# Patient Record
Sex: Male | Born: 1960 | Race: Black or African American | Hispanic: No | Marital: Married | Smoking: Former smoker
Health system: Southern US, Community
[De-identification: ages and names within clinical notes are randomized; demographics above are authoritative.]

## PROBLEM LIST (undated history)

## (undated) DIAGNOSIS — G4730 Sleep apnea, unspecified: Secondary | ICD-10-CM

## (undated) DIAGNOSIS — Z7952 Long term (current) use of systemic steroids: Secondary | ICD-10-CM

## (undated) DIAGNOSIS — K219 Gastro-esophageal reflux disease without esophagitis: Secondary | ICD-10-CM

## (undated) DIAGNOSIS — E7800 Pure hypercholesterolemia, unspecified: Secondary | ICD-10-CM

## (undated) DIAGNOSIS — E781 Pure hyperglyceridemia: Secondary | ICD-10-CM

## (undated) DIAGNOSIS — T380X5A Adverse effect of glucocorticoids and synthetic analogues, initial encounter: Secondary | ICD-10-CM

## (undated) DIAGNOSIS — D84821 Immunodeficiency due to drugs: Secondary | ICD-10-CM

## (undated) DIAGNOSIS — E11319 Type 2 diabetes mellitus with unspecified diabetic retinopathy without macular edema: Secondary | ICD-10-CM

## (undated) DIAGNOSIS — Z940 Kidney transplant status: Secondary | ICD-10-CM

## (undated) DIAGNOSIS — E119 Type 2 diabetes mellitus without complications: Secondary | ICD-10-CM

## (undated) DIAGNOSIS — I739 Peripheral vascular disease, unspecified: Secondary | ICD-10-CM

## (undated) DIAGNOSIS — I10 Essential (primary) hypertension: Secondary | ICD-10-CM

## (undated) DIAGNOSIS — J189 Pneumonia, unspecified organism: Secondary | ICD-10-CM

## (undated) DIAGNOSIS — E1143 Type 2 diabetes mellitus with diabetic autonomic (poly)neuropathy: Secondary | ICD-10-CM

## (undated) DIAGNOSIS — N186 End stage renal disease: Secondary | ICD-10-CM

## (undated) DIAGNOSIS — R6889 Other general symptoms and signs: Secondary | ICD-10-CM

## (undated) HISTORY — PX: AV FISTULA REPAIR: SHX563

## (undated) HISTORY — PX: CATARACT EXTRACTION W/ INTRAOCULAR LENS  IMPLANT, BILATERAL: SHX1307

## (undated) HISTORY — PX: AV FISTULA PLACEMENT: SHX1204

---

## 2002-07-05 HISTORY — PX: KIDNEY TRANSPLANT: SHX239

## 2011-03-08 ENCOUNTER — Emergency Department (HOSPITAL_COMMUNITY)
Admission: EM | Admit: 2011-03-08 | Discharge: 2011-03-08 | Disposition: A | Discharge status: Home / Self Care | Payer: Medicare Other | Attending: Emergency Medicine | Admitting: Emergency Medicine

## 2011-03-08 DIAGNOSIS — I10 Essential (primary) hypertension: Secondary | ICD-10-CM | POA: Insufficient documentation

## 2011-03-08 DIAGNOSIS — F29 Unspecified psychosis not due to a substance or known physiological condition: Secondary | ICD-10-CM | POA: Insufficient documentation

## 2011-03-08 DIAGNOSIS — R5381 Other malaise: Secondary | ICD-10-CM | POA: Insufficient documentation

## 2011-03-08 DIAGNOSIS — E1169 Type 2 diabetes mellitus with other specified complication: Secondary | ICD-10-CM | POA: Insufficient documentation

## 2011-03-08 DIAGNOSIS — Z79899 Other long term (current) drug therapy: Secondary | ICD-10-CM | POA: Insufficient documentation

## 2011-03-08 DIAGNOSIS — Z794 Long term (current) use of insulin: Secondary | ICD-10-CM | POA: Insufficient documentation

## 2011-03-08 LAB — BASIC METABOLIC PANEL
BUN: 24 mg/dL — ABNORMAL HIGH (ref 6–23)
Creatinine, Ser: 0.99 mg/dL (ref 0.50–1.35)
GFR calc Af Amer: >60 mL/min (ref >60)
GFR calc non Af Amer: >60 mL/min (ref >60)
Glucose, Bld: 115 mg/dL — ABNORMAL HIGH (ref 70–99)

## 2011-03-08 LAB — URINE MICROSCOPIC-ADD ON

## 2011-03-08 LAB — URINALYSIS, ROUTINE W REFLEX MICROSCOPIC
Bilirubin Urine: NEGATIVE
Leukocytes, UA: NEGATIVE
Nitrite: NEGATIVE
Specific Gravity, Urine: 1.018 (ref 1.005–1.030)
pH: 5 (ref 5.0–8.0)

## 2011-03-08 LAB — DIFFERENTIAL
Eosinophils Relative: 0 % (ref 0–5)
Lymphocytes Relative: 17 % (ref 12–46)
Lymphs Abs: 1.4 10*3/uL (ref 0.7–4.0)
Monocytes Absolute: 0.8 10*3/uL (ref 0.1–1.0)
Monocytes Relative: 9 % (ref 3–12)

## 2011-03-08 LAB — CBC
HCT: 48 % (ref 39.0–52.0)
Hemoglobin: 16.1 g/dL (ref 13.0–17.0)
MCHC: 33.5 g/dL (ref 30.0–36.0)
MCV: 85.1 fL (ref 78.0–100.0)
RDW: 14.8 % (ref 11.5–15.5)

## 2011-03-08 LAB — GLUCOSE, CAPILLARY: Glucose-Capillary: 130 mg/dL — ABNORMAL HIGH (ref 70–99)

## 2011-11-23 ENCOUNTER — Emergency Department (HOSPITAL_COMMUNITY)
Admission: EM | Admit: 2011-11-23 | Discharge: 2011-11-23 | Disposition: A | Discharge status: Home / Self Care | Payer: Medicare Other | Attending: Emergency Medicine | Admitting: Emergency Medicine

## 2011-11-23 ENCOUNTER — Encounter (HOSPITAL_COMMUNITY): Payer: Self-pay

## 2011-11-23 DIAGNOSIS — R5381 Other malaise: Secondary | ICD-10-CM | POA: Insufficient documentation

## 2011-11-23 DIAGNOSIS — E119 Type 2 diabetes mellitus without complications: Secondary | ICD-10-CM | POA: Insufficient documentation

## 2011-11-23 DIAGNOSIS — I10 Essential (primary) hypertension: Secondary | ICD-10-CM | POA: Insufficient documentation

## 2011-11-23 DIAGNOSIS — E860 Dehydration: Secondary | ICD-10-CM | POA: Insufficient documentation

## 2011-11-23 DIAGNOSIS — R739 Hyperglycemia, unspecified: Secondary | ICD-10-CM

## 2011-11-23 DIAGNOSIS — Z79899 Other long term (current) drug therapy: Secondary | ICD-10-CM | POA: Insufficient documentation

## 2011-11-23 DIAGNOSIS — R531 Weakness: Secondary | ICD-10-CM

## 2011-11-23 HISTORY — DX: Essential (primary) hypertension: I10

## 2011-11-23 LAB — COMPREHENSIVE METABOLIC PANEL
Albumin: 2.5 g/dL — ABNORMAL LOW (ref 3.5–5.2)
Alkaline Phosphatase: 66 U/L (ref 39–117)
BUN: 22 mg/dL (ref 6–23)
Calcium: 10.2 mg/dL (ref 8.4–10.5)
GFR calc Af Amer: 71 mL/min — ABNORMAL LOW (ref >90)
Glucose, Bld: 386 mg/dL — ABNORMAL HIGH (ref 70–99)
Potassium: 4.6 mEq/L (ref 3.5–5.1)
Total Protein: 8 g/dL (ref 6.0–8.3)

## 2011-11-23 LAB — CBC
HCT: 49.4 % (ref 39.0–52.0)
MCH: 27.6 pg (ref 26.0–34.0)
MCHC: 32.8 g/dL (ref 30.0–36.0)
RDW: 13.3 % (ref 11.5–15.5)

## 2011-11-23 LAB — GLUCOSE, CAPILLARY
Glucose-Capillary: 390 mg/dL — ABNORMAL HIGH (ref 70–99)
Glucose-Capillary: 297 mg/dL — ABNORMAL HIGH (ref 70–99)
Glucose-Capillary: 251 mg/dL — ABNORMAL HIGH (ref 70–99)

## 2011-11-23 LAB — DIFFERENTIAL
Basophils Absolute: 0.1 10*3/uL (ref 0.0–0.1)
Basophils Relative: 1 % (ref 0–1)
Eosinophils Absolute: 0.1 10*3/uL (ref 0.0–0.7)
Lymphocytes Relative: 27 % (ref 12–46)
Monocytes Absolute: 1.1 10*3/uL — ABNORMAL HIGH (ref 0.1–1.0)
Neutro Abs: 5.9 10*3/uL (ref 1.7–7.7)
Neutrophils Relative %: 60 % (ref 43–77)

## 2011-11-23 LAB — URINALYSIS, ROUTINE W REFLEX MICROSCOPIC
Leukocytes, UA: NEGATIVE
Nitrite: NEGATIVE
Specific Gravity, Urine: 1.03 (ref 1.005–1.030)
Urobilinogen, UA: 0.2 mg/dL (ref 0.0–1.0)
pH: 5.5 (ref 5.0–8.0)

## 2011-11-23 LAB — LIPASE, BLOOD: Lipase: 29 U/L (ref 11–59)

## 2011-11-23 MED ORDER — INSULIN ASPART 100 UNIT/ML ~~LOC~~ SOLN
10.0000 [IU] | Freq: Once | SUBCUTANEOUS | Status: AC
  Administered 2011-11-23: 10 [IU] via INTRAVENOUS
  Filled 2011-11-23: qty 10

## 2011-11-23 MED ORDER — SODIUM CHLORIDE 0.9 % IV BOLUS (SEPSIS)
1000.0000 mL | Freq: Once | INTRAVENOUS | Status: AC
  Administered 2011-11-23: 1000 mL via INTRAVENOUS

## 2011-11-23 MED ORDER — INSULIN ASPART 100 UNIT/ML ~~LOC~~ SOLN
5.0000 [IU] | Freq: Once | SUBCUTANEOUS | Status: AC
  Administered 2011-11-23: 5 [IU] via INTRAVENOUS

## 2011-11-23 MED ORDER — MECLIZINE HCL 25 MG PO TABS
12.5000 mg | ORAL_TABLET | Freq: Once | ORAL | Status: AC
  Administered 2011-11-23: 12.5 mg via ORAL
  Filled 2011-11-23: qty 1

## 2011-11-23 MED ORDER — ONDANSETRON HCL 4 MG/2ML IJ SOLN
4.0000 mg | Freq: Once | INTRAMUSCULAR | Status: AC
  Administered 2011-11-23: 4 mg via INTRAVENOUS
  Filled 2011-11-23: qty 2

## 2011-11-23 MED ORDER — ONDANSETRON HCL 4 MG PO TABS: 4.0000 mg | ORAL_TABLET | Freq: Four times a day (QID) | ORAL | Status: AC

## 2011-11-23 MED ORDER — INSULIN ASPART 100 UNIT/ML ~~LOC~~ SOLN
5.0000 [IU] | Freq: Once | SUBCUTANEOUS | Status: AC
  Administered 2011-11-23: 5 [IU] via SUBCUTANEOUS

## 2011-11-23 MED ORDER — INSULIN ASPART 100 UNIT/ML ~~LOC~~ SOLN
5.0000 [IU] | Freq: Once | SUBCUTANEOUS | Status: AC
  Administered 2011-11-23: 5 [IU] via INTRAVENOUS
  Filled 2011-11-23: qty 5

## 2011-11-23 NOTE — ED Provider Notes (Signed)
History     CSN: [id number]  Arrival date & time 11/23/11  1478   First MD Initiated Contact with Patient 11/23/11 [phone]      Chief Complaint  Patient presents with  . Weakness    (Consider location/radiation/quality/duration/timing/severity/associated sxs/prior treatment) HPI  Patient who primarily lives in [place] however who has been residing in [place] this month visiting his daughter presents to emergency department complaining of a four-day history of gradual onset lightheadedness, generalized weakness, loss of appetite, nausea, chills, and initially some low back pain has resolved. Patient states that over the last few days he's had no appetite stating "everything I put my mouth tastes disgusting so I haven't been eating." Patient states he has been able to tolerate fluids for the most part. He states he has mild nausea but denies vomiting. Patient states he's had 2 loose stools. Patient has history of right-sided kidney transplant in 2004 but states he's had no complications and is followed by a nephrologist in [place]. Patient has history of insulin-dependent diabetes and hypertension for which takes medication. Patient states he has not been checking his blood sugars because he has not been eating. Patient denies any aggravating or alleviating factors. Patient states symptoms were gradual onset, persistent, and worsening stating "I hardly have the energy to get out of bed." He denies known recorded fever but states he's felt feverish, he denies headache, visual changes, sore throat, earache, cough, chest pain, shortness of breath, abdominal pain, dysuria, hematuria, or blood in his stool. He denies any one sided weakness stating overall he just feels weak.  Past Medical History  Diagnosis Date  . Diabetes mellitus   . Hypertension   . Renal disorder     Past Surgical History  Procedure Date  . Kidney transplant     2004  . Eye surgery     cataract removed    No  family history on file.  History  Substance Use Topics  . Smoking status: Never Smoker   . Smokeless tobacco: Not on file  . Alcohol Use: Yes      Review of Systems  All other systems reviewed and are negative.    Allergies  Penicillins  Home Medications   Current Outpatient Rx  Name Route Sig Dispense Refill  . ESOMEPRAZOLE MAGNESIUM 40 MG PO CPDR Oral Take 40 mg by mouth daily before breakfast.    . INSULIN ASPART 100 UNIT/ML [place] SOLN Subcutaneous Inject 12 Units into the skin 3 (three) times daily before meals.    . INSULIN GLARGINE 100 UNIT/ML [place] SOLN Subcutaneous Inject 32 Units into the skin 2 (two) times daily.    Marland Kitchen METOPROLOL TARTRATE 25 MG PO TABS Oral Take 25 mg by mouth 2 (two) times daily.    Marland Kitchen MYCOPHENOLATE SODIUM 360 MG PO TBEC Oral Take 360 mg by mouth 3 (three) times daily.    . OMEGA-3-ACID ETHYL ESTERS 1 G PO CAPS Oral Take 2 g by mouth 2 (two) times daily.    Marland Kitchen PREDNISONE 5 MG PO TABS Oral Take 5 mg by mouth daily.    Marland Kitchen TACROLIMUS 1 MG PO CAPS Oral Take 4 mg by mouth 2 (two) times daily.      BP 147/83  Pulse 117  Temp(Src) 99.6 F (37.6 C) (Oral)  Resp 20  Ht 5\' 10"  (1.778 m)  Wt 255 lb (115.667 kg)  BMI 36.59 kg/m2  SpO2 94%  Physical Exam  Nursing note and vitals reviewed. Constitutional: He is  oriented to person, place, and time. He appears well-developed and well-nourished. No distress.  HENT:  Head: Normocephalic and atraumatic.  Eyes: Conjunctivae and EOM are normal. Pupils are equal, round, and reactive to light.  Neck: Normal range of motion. Neck supple.       No nystagmus.   Cardiovascular: Normal rate, regular rhythm, normal heart sounds and intact distal pulses.  Exam reveals no gallop and no friction rub.   No murmur heard. Pulmonary/Chest: Effort normal and breath sounds normal. No respiratory distress. He has no wheezes. He has no rales. He exhibits no tenderness.  Abdominal: Soft. Bowel sounds are normal. He exhibits no  distension and no mass. There is no tenderness. There is no rebound and no guarding.  Musculoskeletal: Normal range of motion. He exhibits no edema and no tenderness.  Lymphadenopathy:    He has no cervical adenopathy.  Neurological: He is alert and oriented to person, place, and time. No cranial nerve deficit. Coordination normal.  Skin: Skin is warm and dry. No rash noted. He is not diaphoretic. No erythema.  Psychiatric: He has a normal mood and affect.    ED Course  Procedures (including critical care time)  IV fluids and zofran  IV insulin and 3 liters of fluid  Labs Reviewed  CBC - Abnormal; Notable for the following:    RBC 5.88 (*)    Platelets 122 (*)    All other components within normal limits  DIFFERENTIAL - Abnormal; Notable for the following:    Monocytes Absolute 1.1 (*)    All other components within normal limits  COMPREHENSIVE METABOLIC PANEL - Abnormal; Notable for the following:    Sodium 127 (*)    Chloride 85 (*)    Glucose, Bld 386 (*)    Albumin 2.5 (*)    GFR calc non Af Amer 62 (*)    GFR calc Af Amer 71 (*)    All other components within normal limits  URINALYSIS, ROUTINE W REFLEX MICROSCOPIC - Abnormal; Notable for the following:    Glucose, UA >1000 (*)    Hgb urine dipstick MODERATE (*)    Bilirubin Urine SMALL (*)    Ketones, ur 40 (*)    Protein, ur 100 (*)    All other components within normal limits  GLUCOSE, CAPILLARY - Abnormal; Notable for the following:    Glucose-Capillary 390 (*)    All other components within normal limits  GLUCOSE, CAPILLARY - Abnormal; Notable for the following:    Glucose-Capillary 297 (*)    All other components within normal limits  GLUCOSE, CAPILLARY - Abnormal; Notable for the following:    Glucose-Capillary 279 (*)    All other components within normal limits  GLUCOSE, CAPILLARY - Abnormal; Notable for the following:    Glucose-Capillary 251 (*)    All other components within normal limits  LIPASE,  BLOOD  URINE MICROSCOPIC-ADD ON   No results found.   1. Weakness   2. Hyperglycemia   3. Dehydration       MDM  After fluid resuscitation and hyperglycemia corrected in the emergency department. After 3 L of IV fluids patient is no longer orthostatic and he is ambulating without difficulty with resolution of dizziness. He is eating and drinking in ER without difficulty. Patient no neuro focal findings and is negative for ataxia. Patient is agreeable to either establishing care with her primary care provider while he is in [place] area for following up closely with his primary care provider in  [place]. Spoke at length with patient changing or worsening symptoms it should warrent return to emergency department. Given atypical lymphocytes question a recent viral illness causing his generalized weakness.         [place], [place] 11/23/11 1534

## 2011-11-23 NOTE — ED Notes (Addendum)
Pt presented to the ER with c/o generalized weakness, not eating, nausea x 4 days, diarrhea onset last night. Pt also complains of dizziness, lightheadedness, decrease appetite, chills. Pt is attached to the monitor

## 2011-11-23 NOTE — Discharge Instructions (Signed)
If you plan on staying in [place] for prolonged periods of time, it is very important establish a primary care provider while you're in [place] to manage your diabetes. Otherwise followup with your primary care provider in [place] in the near future for further evaluation and management of your diabetes is very important. Stay well-hydrated. It is very important to keep a well-rounded diet despite loss of appetite to regulate your diabetes but also to stay well-hydrated and for general strength. Return to any emergency department at any time for emergent changing or worsening symptoms.  Dehydration, Adult Dehydration means your body does not have as much fluid as it needs. Your kidneys, brain, and heart will not work properly without the right amount of fluids and salt.  HOME CARE  Ask your doctor how to replace body fluid losses (rehydrate).   Drink enough fluids to keep your pee (urine) clear or pale yellow.   Drink small amounts of fluids often if you feel sick to your stomach (nauseous) or throw up (vomit).   Eat like you normally do.   Avoid:   Foods or drinks high in sugar.   Bubbly (carbonated) drinks.   Juice.   Very hot or cold fluids.   Drinks with caffeine.   Fatty, greasy foods.   Alcohol.   Tobacco.   Eating too much.   Gelatin desserts.   Wash your hands to avoid spreading germs (bacteria, viruses).   Only take medicine as told by your doctor.   Keep all doctor visits as told.  GET HELP RIGHT AWAY IF:   You cannot drink something without throwing up.   You get worse even with treatment.   Your vomit has blood in it or looks greenish.   Your poop (stool) has blood in it or looks black and tarry.   You have not peed in 6 to 8 hours.   You pee a small amount of very dark pee.   You have a fever.   You pass out (faint).   You have belly (abdominal) pain that gets worse or stays in one spot (localizes).   You have a rash, stiff neck, or  bad headache.   You get easily annoyed, sleepy, or are hard to wake up.   You feel weak, dizzy, or very thirsty.  MAKE SURE YOU:   Understand these instructions.   Will watch your condition.   Will get help right away if you are not doing well or get worse.  Document Released: 04/17/2009 Document Revised: 06/10/2011 Document Reviewed: 02/08/2011 [facility] Patient Information 2012 [place], [place].

## 2011-11-23 NOTE — ED Notes (Signed)
Patient attempted to ambulate but was unable to. Stated he was too dizzy and weak.

## 2011-11-23 NOTE — ED Provider Notes (Signed)
Medical screening examination/treatment/procedure(s) were conducted as a shared visit with non-physician practitioner(s) and myself.  I personally evaluated the patient during the encounter   Dione Booze, MD 11/23/11 [phone]

## 2011-11-23 NOTE — ED Notes (Signed)
Seen and examined by B. Hunt PAC 

## 2011-11-23 NOTE — ED Notes (Signed)
CBG 279 

## 2011-11-23 NOTE — ED Provider Notes (Signed)
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 When compared with ECG of 03/08/2011, no significant changes are seen.  Old EKG Reviewed: unchanged  51 year old male presents with weakness for 4 days. He has noted some nausea but no chest pain and no fever or chills. His exam is significant for a 1-2/6 systolic ejection murmur heard along the left sternal border. He is noted to be moderately hyponatremic and moderately hyperglycemic. Orthostatic vital signs are markedly positive with blood pressure dropping to 70 systolic. Urinalysis is pending.  Trevor Booze, MD 11/23/11 [phone]

## 2012-02-21 ENCOUNTER — Inpatient Hospital Stay (HOSPITAL_COMMUNITY): Payer: Medicare Other

## 2012-02-21 ENCOUNTER — Encounter (HOSPITAL_COMMUNITY): Payer: Self-pay | Admitting: *Deleted

## 2012-02-21 ENCOUNTER — Inpatient Hospital Stay (HOSPITAL_COMMUNITY)
Admission: EM | Admit: 2012-02-21 | Discharge: 2012-03-01 | DRG: 256 | Disposition: A | Discharge status: Home / Self Care | Payer: Medicare Other | Attending: Internal Medicine | Admitting: Internal Medicine

## 2012-02-21 ENCOUNTER — Emergency Department (HOSPITAL_COMMUNITY): Payer: Medicare Other

## 2012-02-21 DIAGNOSIS — E1059 Type 1 diabetes mellitus with other circulatory complications: Principal | ICD-10-CM | POA: Diagnosis present

## 2012-02-21 DIAGNOSIS — M79609 Pain in unspecified limb: Secondary | ICD-10-CM

## 2012-02-21 DIAGNOSIS — E1142 Type 2 diabetes mellitus with diabetic polyneuropathy: Secondary | ICD-10-CM

## 2012-02-21 DIAGNOSIS — L03119 Cellulitis of unspecified part of limb: Secondary | ICD-10-CM

## 2012-02-21 DIAGNOSIS — D84821 Immunodeficiency due to drugs: Secondary | ICD-10-CM | POA: Insufficient documentation

## 2012-02-21 DIAGNOSIS — I10 Essential (primary) hypertension: Secondary | ICD-10-CM | POA: Diagnosis present

## 2012-02-21 DIAGNOSIS — L02619 Cutaneous abscess of unspecified foot: Secondary | ICD-10-CM | POA: Diagnosis present

## 2012-02-21 DIAGNOSIS — E11319 Type 2 diabetes mellitus with unspecified diabetic retinopathy without macular edema: Secondary | ICD-10-CM | POA: Diagnosis present

## 2012-02-21 DIAGNOSIS — E1149 Type 2 diabetes mellitus with other diabetic neurological complication: Secondary | ICD-10-CM

## 2012-02-21 DIAGNOSIS — R6889 Other general symptoms and signs: Secondary | ICD-10-CM

## 2012-02-21 DIAGNOSIS — R509 Fever, unspecified: Secondary | ICD-10-CM | POA: Diagnosis not present

## 2012-02-21 DIAGNOSIS — E781 Pure hyperglyceridemia: Secondary | ICD-10-CM

## 2012-02-21 DIAGNOSIS — G909 Disorder of the autonomic nervous system, unspecified: Secondary | ICD-10-CM

## 2012-02-21 DIAGNOSIS — L03116 Cellulitis of left lower limb: Secondary | ICD-10-CM

## 2012-02-21 DIAGNOSIS — M79672 Pain in left foot: Secondary | ICD-10-CM | POA: Diagnosis present

## 2012-02-21 DIAGNOSIS — I96 Gangrene, not elsewhere classified: Secondary | ICD-10-CM | POA: Diagnosis present

## 2012-02-21 DIAGNOSIS — E1049 Type 1 diabetes mellitus with other diabetic neurological complication: Secondary | ICD-10-CM | POA: Diagnosis present

## 2012-02-21 DIAGNOSIS — I951 Orthostatic hypotension: Secondary | ICD-10-CM | POA: Diagnosis present

## 2012-02-21 DIAGNOSIS — Z940 Kidney transplant status: Secondary | ICD-10-CM

## 2012-02-21 DIAGNOSIS — D72829 Elevated white blood cell count, unspecified: Secondary | ICD-10-CM | POA: Diagnosis present

## 2012-02-21 DIAGNOSIS — E1029 Type 1 diabetes mellitus with other diabetic kidney complication: Secondary | ICD-10-CM | POA: Diagnosis present

## 2012-02-21 DIAGNOSIS — E119 Type 2 diabetes mellitus without complications: Secondary | ICD-10-CM | POA: Diagnosis present

## 2012-02-21 DIAGNOSIS — E1039 Type 1 diabetes mellitus with other diabetic ophthalmic complication: Secondary | ICD-10-CM | POA: Diagnosis present

## 2012-02-21 DIAGNOSIS — N179 Acute kidney failure, unspecified: Secondary | ICD-10-CM | POA: Diagnosis not present

## 2012-02-21 DIAGNOSIS — L97509 Non-pressure chronic ulcer of other part of unspecified foot with unspecified severity: Secondary | ICD-10-CM | POA: Diagnosis present

## 2012-02-21 DIAGNOSIS — E860 Dehydration: Secondary | ICD-10-CM | POA: Diagnosis present

## 2012-02-21 DIAGNOSIS — E1143 Type 2 diabetes mellitus with diabetic autonomic (poly)neuropathy: Secondary | ICD-10-CM | POA: Diagnosis present

## 2012-02-21 DIAGNOSIS — E1165 Type 2 diabetes mellitus with hyperglycemia: Secondary | ICD-10-CM

## 2012-02-21 DIAGNOSIS — K219 Gastro-esophageal reflux disease without esophagitis: Secondary | ICD-10-CM | POA: Diagnosis present

## 2012-02-21 DIAGNOSIS — N058 Unspecified nephritic syndrome with other morphologic changes: Secondary | ICD-10-CM | POA: Diagnosis present

## 2012-02-21 DIAGNOSIS — H579 Unspecified disorder of eye and adnexa: Secondary | ICD-10-CM

## 2012-02-21 DIAGNOSIS — I739 Peripheral vascular disease, unspecified: Secondary | ICD-10-CM | POA: Diagnosis present

## 2012-02-21 DIAGNOSIS — E1139 Type 2 diabetes mellitus with other diabetic ophthalmic complication: Secondary | ICD-10-CM

## 2012-02-21 DIAGNOSIS — Z7952 Long term (current) use of systemic steroids: Secondary | ICD-10-CM

## 2012-02-21 DIAGNOSIS — E1152 Type 2 diabetes mellitus with diabetic peripheral angiopathy with gangrene: Secondary | ICD-10-CM | POA: Diagnosis present

## 2012-02-21 DIAGNOSIS — E1065 Type 1 diabetes mellitus with hyperglycemia: Secondary | ICD-10-CM | POA: Diagnosis present

## 2012-02-21 DIAGNOSIS — I70269 Atherosclerosis of native arteries of extremities with gangrene, unspecified extremity: Secondary | ICD-10-CM

## 2012-02-21 HISTORY — DX: Pure hyperglyceridemia: E78.1

## 2012-02-21 HISTORY — DX: Type 2 diabetes mellitus with unspecified diabetic retinopathy without macular edema: E11.319

## 2012-02-21 HISTORY — DX: Peripheral vascular disease, unspecified: I73.9

## 2012-02-21 HISTORY — DX: Long term (current) use of systemic steroids: T38.0X5A

## 2012-02-21 HISTORY — DX: Other general symptoms and signs: R68.89

## 2012-02-21 HISTORY — DX: Kidney transplant status: Z94.0

## 2012-02-21 HISTORY — DX: Gastro-esophageal reflux disease without esophagitis: K21.9

## 2012-02-21 HISTORY — DX: Long term (current) use of systemic steroids: Z79.52

## 2012-02-21 HISTORY — DX: Immunodeficiency due to drugs: D84.821

## 2012-02-21 HISTORY — DX: Type 2 diabetes mellitus with diabetic autonomic (poly)neuropathy: E11.43

## 2012-02-21 LAB — GLUCOSE, CAPILLARY
Glucose-Capillary: 262 mg/dL — ABNORMAL HIGH (ref 70–99)
Glucose-Capillary: 261 mg/dL — ABNORMAL HIGH (ref 70–99)

## 2012-02-21 LAB — CBC
MCHC: 32.5 g/dL (ref 30.0–36.0)
Platelets: 177 10*3/uL (ref 150–400)
RDW: 13.9 % (ref 11.5–15.5)
WBC: 14.5 10*3/uL — ABNORMAL HIGH (ref 4.0–10.5)

## 2012-02-21 LAB — CBC WITH DIFFERENTIAL/PLATELET
Eosinophils Absolute: 0.1 10*3/uL (ref 0.0–0.7)
Hemoglobin: 16.1 g/dL (ref 13.0–17.0)
Lymphocytes Relative: 22 % (ref 12–46)
Lymphs Abs: 2.2 10*3/uL (ref 0.7–4.0)
MCH: 28 pg (ref 26.0–34.0)
Neutro Abs: 6.3 10*3/uL (ref 1.7–7.7)
Neutrophils Relative %: 65 % (ref 43–77)
Platelets: 176 10*3/uL (ref 150–400)
RBC: 5.75 MIL/uL (ref 4.22–5.81)
WBC: 9.8 10*3/uL (ref 4.0–10.5)

## 2012-02-21 LAB — BASIC METABOLIC PANEL
Chloride: 101 mEq/L (ref 96–112)
GFR calc non Af Amer: 85 mL/min — ABNORMAL LOW (ref >90)
Glucose, Bld: 316 mg/dL — ABNORMAL HIGH (ref 70–99)
Potassium: 4.8 mEq/L (ref 3.5–5.1)
Sodium: 135 mEq/L (ref 135–145)

## 2012-02-21 LAB — CREATININE, SERUM
Creatinine, Ser: 0.91 mg/dL (ref 0.50–1.35)
GFR calc Af Amer: >90 mL/min (ref >90)
GFR calc non Af Amer: >90 mL/min (ref >90)

## 2012-02-21 MED ORDER — INSULIN GLARGINE 100 UNIT/ML ~~LOC~~ SOLN
32.0000 [IU] | Freq: Two times a day (BID) | SUBCUTANEOUS | Status: DC
  Administered 2012-02-21 – 2012-02-29 (×12): 32 [IU] via SUBCUTANEOUS

## 2012-02-21 MED ORDER — SODIUM CHLORIDE 0.9 % IV SOLN
INTRAVENOUS | Status: DC
  Administered 2012-02-21: 100 mL/h via INTRAVENOUS

## 2012-02-21 MED ORDER — MORPHINE SULFATE 2 MG/ML IJ SOLN
1.0000 mg | INTRAMUSCULAR | Status: DC | PRN
  Administered 2012-02-23 (×2): 1 mg via INTRAVENOUS
  Filled 2012-02-21 (×3): qty 1

## 2012-02-21 MED ORDER — HYDROMORPHONE HCL PF 1 MG/ML IJ SOLN
1.0000 mg | INTRAMUSCULAR | Status: DC | PRN
  Administered 2012-02-21: 1 mg via INTRAVENOUS
  Filled 2012-02-21: qty 1

## 2012-02-21 MED ORDER — ONDANSETRON HCL 4 MG/2ML IJ SOLN: 4.0000 mg | Freq: Three times a day (TID) | INTRAMUSCULAR | Status: DC | PRN

## 2012-02-21 MED ORDER — OMEGA-3-ACID ETHYL ESTERS 1 G PO CAPS
2.0000 g | ORAL_CAPSULE | Freq: Two times a day (BID) | ORAL | Status: DC
  Administered 2012-02-21 – 2012-03-01 (×17): 2 g via ORAL
  Filled 2012-02-21 (×19): qty 2

## 2012-02-21 MED ORDER — PANTOPRAZOLE SODIUM 40 MG PO TBEC
40.0000 mg | DELAYED_RELEASE_TABLET | Freq: Every day | ORAL | Status: DC
  Administered 2012-02-21 – 2012-03-01 (×8): 40 mg via ORAL
  Filled 2012-02-21 (×8): qty 1

## 2012-02-21 MED ORDER — SULFAMETHOXAZOLE-TMP DS 800-160 MG PO TABS
2.0000 | ORAL_TABLET | Freq: Two times a day (BID) | ORAL | Status: DC
  Administered 2012-02-21 – 2012-02-26 (×9): 2 via ORAL
  Filled 2012-02-21 (×12): qty 2

## 2012-02-21 MED ORDER — DOCUSATE SODIUM 100 MG PO CAPS
100.0000 mg | ORAL_CAPSULE | Freq: Two times a day (BID) | ORAL | Status: DC
  Administered 2012-02-21 – 2012-02-28 (×11): 100 mg via ORAL
  Filled 2012-02-21 (×19): qty 1

## 2012-02-21 MED ORDER — INSULIN ASPART 100 UNIT/ML ~~LOC~~ SOLN
0.0000 [IU] | Freq: Three times a day (TID) | SUBCUTANEOUS | Status: DC
  Administered 2012-02-21: 8 [IU] via SUBCUTANEOUS
  Administered 2012-02-22: 5 [IU] via SUBCUTANEOUS
  Administered 2012-02-22 – 2012-02-24 (×4): 2 [IU] via SUBCUTANEOUS
  Administered 2012-02-25: 3 [IU] via SUBCUTANEOUS
  Administered 2012-02-26: 2 [IU] via SUBCUTANEOUS
  Administered 2012-02-28 – 2012-02-29 (×2): 3 [IU] via SUBCUTANEOUS

## 2012-02-21 MED ORDER — MOXIFLOXACIN HCL 400 MG PO TABS
400.0000 mg | ORAL_TABLET | Freq: Every day | ORAL | Status: DC
  Administered 2012-02-21 – 2012-02-25 (×5): 400 mg via ORAL
  Filled 2012-02-21 (×6): qty 1

## 2012-02-21 MED ORDER — VANCOMYCIN HCL IN DEXTROSE 1-5 GM/200ML-% IV SOLN
1000.0000 mg | Freq: Once | INTRAVENOUS | Status: AC
  Administered 2012-02-21: 1000 mg via INTRAVENOUS
  Filled 2012-02-21: qty 200

## 2012-02-21 MED ORDER — SACCHAROMYCES BOULARDII 250 MG PO CAPS
250.0000 mg | ORAL_CAPSULE | Freq: Two times a day (BID) | ORAL | Status: DC
  Administered 2012-02-21 – 2012-03-01 (×17): 250 mg via ORAL
  Filled 2012-02-21 (×19): qty 1

## 2012-02-21 MED ORDER — MYCOPHENOLATE SODIUM 180 MG PO TBEC
360.0000 mg | DELAYED_RELEASE_TABLET | Freq: Three times a day (TID) | ORAL | Status: DC
  Administered 2012-02-21 – 2012-03-01 (×27): 360 mg via ORAL
  Filled 2012-02-21 (×29): qty 2

## 2012-02-21 MED ORDER — SODIUM CHLORIDE 0.9 % IV SOLN
INTRAVENOUS | Status: DC
  Administered 2012-02-21: 10:00:00 via INTRAVENOUS

## 2012-02-21 MED ORDER — INSULIN ASPART 100 UNIT/ML ~~LOC~~ SOLN
6.0000 [IU] | Freq: Three times a day (TID) | SUBCUTANEOUS | Status: DC
Start: 2012-02-21 — End: 2012-02-22
  Administered 2012-02-21 – 2012-02-22 (×2): 6 [IU] via SUBCUTANEOUS

## 2012-02-21 MED ORDER — SODIUM CHLORIDE 0.9 % IV SOLN
INTRAVENOUS | Status: DC
  Administered 2012-02-21: 1000 mL via INTRAVENOUS
  Administered 2012-02-22: 04:00:00 via INTRAVENOUS

## 2012-02-21 MED ORDER — HEPARIN SODIUM (PORCINE) 5000 UNIT/ML IJ SOLN
5000.0000 [IU] | Freq: Three times a day (TID) | INTRAMUSCULAR | Status: DC
  Administered 2012-02-21 – 2012-03-01 (×26): 5000 [IU] via SUBCUTANEOUS
  Filled 2012-02-21 (×30): qty 1

## 2012-02-21 MED ORDER — SULFAMETHOXAZOLE-TMP DS 800-160 MG PO TABS
2.0000 | ORAL_TABLET | Freq: Two times a day (BID) | ORAL | Status: DC
  Filled 2012-02-21: qty 2

## 2012-02-21 MED ORDER — TACROLIMUS 1 MG PO CAPS
4.0000 mg | ORAL_CAPSULE | Freq: Two times a day (BID) | ORAL | Status: DC
  Administered 2012-02-21 – 2012-03-01 (×17): 4 mg via ORAL
  Filled 2012-02-21 (×20): qty 4

## 2012-02-21 MED ORDER — METOPROLOL TARTRATE 25 MG PO TABS
25.0000 mg | ORAL_TABLET | Freq: Two times a day (BID) | ORAL | Status: DC
  Administered 2012-02-21 – 2012-03-01 (×18): 25 mg via ORAL
  Filled 2012-02-21 (×19): qty 1

## 2012-02-21 MED ORDER — PREDNISONE 5 MG PO TABS
5.0000 mg | ORAL_TABLET | Freq: Every day | ORAL | Status: DC
  Administered 2012-02-21 – 2012-03-01 (×9): 5 mg via ORAL
  Filled 2012-02-21 (×12): qty 1

## 2012-02-21 MED ORDER — ONDANSETRON HCL 4 MG/2ML IJ SOLN: 4.0000 mg | Freq: Four times a day (QID) | INTRAMUSCULAR | Status: DC | PRN

## 2012-02-21 MED ORDER — HYDROCODONE-ACETAMINOPHEN 5-325 MG PO TABS
1.0000 | ORAL_TABLET | ORAL | Status: DC | PRN
  Administered 2012-02-22 – 2012-03-01 (×10): 2 via ORAL
  Filled 2012-02-21 (×10): qty 2

## 2012-02-21 MED ORDER — ONDANSETRON HCL 4 MG PO TABS: 4.0000 mg | ORAL_TABLET | Freq: Four times a day (QID) | ORAL | Status: DC | PRN

## 2012-02-21 NOTE — Progress Notes (Addendum)
Disposition Note  Trevor Thompson, is a 51 y.o. male,   MRN: [id number]  -  DOB - 04/05/1961  Outpatient Primary MD for the patient is DEFAULT,PROVIDER, MD   Blood pressure 105/72, pulse 114, resp. rate 20, SpO2 97.00%.  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 PVD (peripheral vascular disease)  Insulin dependent type 2 diabetes mellitus, uncontrolled  Dehydration as evidenced by hemoconcentration  History of renal transplant  HTN (hypertension)   Called by Dr. Jodelle Gross the emergency room physician regarding request for admission. This patient is visiting from [place] and is currently unassigned. He presented to the ER because of concerns regarding changes in the left foot/toe as well as new red streak extending up the leg. He endorsed to the emergency room physician one week ago he noticed drainage from the left second toe with subsequent spontaneous demarcation and loss of toenail. No other apparent symptomatology. Emergency room physician did obtain a plain x-ray that showed no bony issues or apparent osteomyelitis. There was an incidental finding of extensive calcification of the vessels of the lower extremity. Patient's past medical history significant for insulin-dependent diabetes as well as chronic immunosuppression due to medications taken related to renal transplantation in 2004. In the emergency department his CBG was 316, hemoglobin seemed somewhat elevated at 16 and suspicious for hemoconcentration. This was further confirmed by slightly elevated calcium at 10.6. The emergency room physician has given this patient a dose of IV vancomycin to cover the cellulitis changes. I asked him to send the patient to medical bed with temporary holding orders. Also given the concerns of possible vascular disease I asked the emergency room physician to obtain arterial Dopplers and ABIs while the  patient is in the emergency department so as not to delay this test in an attempt to decrease his length of stay. I also notified the flow manager of need for medical bed. Please note I did not interview nor examine this patient.  Junious Silk, ANP

## 2012-02-21 NOTE — ED Notes (Signed)
Pt states that his left second toenail fail off one week ago and now necrotic tissue to time of toe with redness to top of foot and red streak going up leg

## 2012-02-21 NOTE — Progress Notes (Addendum)
VASCULAR LAB PRELIMINARY  ARTERIAL  ABI completed:    RIGHT    LEFT    PRESSURE WAVEFORM  PRESSURE WAVEFORM  BRACHIAL 145 Triphasic BRACHIAL 146 Triphasic  DP >300 Triphasic DP >300 Sharp Monophasic Difficult to evaluate         PT >300 Triphasic PT >300 Triphasic         GREAT TOE 88  GREAT TOE 36     RIGHT LEFT  ABI N/A Calcified N/A Calcified   ABI not ascertained due to incompressible vessels. Probably calcified. Doppler waveforms were within normal limits except for the left dorsalis pedis which was difficult to hear for evaluation. Greta toe pressures on the right indicate adequate perfusion. Left Great to pressure indicates inadequate perfusion for vascular healing  Dorthea Maina, RVS 02/21/2012, 2:12 PM

## 2012-02-21 NOTE — ED Provider Notes (Signed)
History     CSN: [id number]  Arrival date & time 02/21/12  0940   First MD Initiated Contact with Patient 02/21/12 1001      Chief Complaint  Patient presents with  . Foot Pain    (Consider location/radiation/quality/duration/timing/severity/associated sxs/prior treatment) Patient is a 51 y.o. male presenting with lower extremity pain. The history is provided by the patient.  Foot Pain This is a new problem. The current episode started more than 2 days ago. The problem occurs constantly. The problem has been gradually worsening. Pertinent negatives include no chest pain, no abdominal pain, no headaches and no shortness of breath.   Patient's left foot several days ago and the second toe nail fall off and had some oozing patient noticed over the weekend that the toe it turned black and he had redness on top and bottom of his foot. Patient is a diabetic he is from out of state he is from [place] does not have a local physician. Patient states that the pain is about 6/10. Pain described as an ache.  Past Medical History  Diagnosis Date  . Diabetes mellitus   . Hypertension   . Renal disorder     Past Surgical History  Procedure Date  . Kidney transplant     2004  . Eye surgery     cataract removed    No family history on file.  History  Substance Use Topics  . Smoking status: Never Smoker   . Smokeless tobacco: Not on file  . Alcohol Use: Yes      Review of Systems  Constitutional: Negative for fever and chills.  HENT: Negative for neck pain.   Eyes: Negative for redness.  Respiratory: Negative for shortness of breath.   Cardiovascular: Negative for chest pain.  Gastrointestinal: Negative for nausea, vomiting and abdominal pain.  Genitourinary: Negative for dysuria and hematuria.  Musculoskeletal: Negative for back pain.  Skin: Negative for rash.  Neurological: Negative for headaches.  Hematological: Does not bruise/bleed easily.    Allergies   Penicillins  Home Medications   Current Outpatient Rx  Name Route Sig Dispense Refill  . ESOMEPRAZOLE MAGNESIUM 40 MG PO CPDR Oral Take 40 mg by mouth daily before breakfast.    . INSULIN ASPART 100 UNIT/ML [place] SOLN Subcutaneous Inject 12 Units into the skin 2 (two) times daily before a meal.     . INSULIN GLARGINE 100 UNIT/ML [place] SOLN Subcutaneous Inject 32 Units into the skin 2 (two) times daily.    Marland Kitchen METOPROLOL TARTRATE 25 MG PO TABS Oral Take 25 mg by mouth 2 (two) times daily.    Marland Kitchen MYCOPHENOLATE SODIUM 360 MG PO TBEC Oral Take 360 mg by mouth 3 (three) times daily.    . OMEGA-3-ACID ETHYL ESTERS 1 G PO CAPS Oral Take 2 g by mouth 2 (two) times daily.    Marland Kitchen PREDNISONE 5 MG PO TABS Oral Take 5 mg by mouth daily.    Marland Kitchen TACROLIMUS 1 MG PO CAPS Oral Take 4 mg by mouth 2 (two) times daily.      BP 105/72  Pulse 114  Resp 20  SpO2 97%  Physical Exam  Nursing note and vitals reviewed. Constitutional: He is oriented to person, place, and time. He appears well-developed and well-nourished. No distress.  HENT:  Head: Normocephalic and atraumatic.  Mouth/Throat: Oropharynx is clear and moist.  Eyes: Conjunctivae and EOM are normal. Pupils are equal, round, and reactive to light.  Neck: Normal range of motion.  Neck supple.  Cardiovascular: Normal rate, regular rhythm and normal heart sounds.   No murmur heard. Pulmonary/Chest: Effort normal and breath sounds normal.  Abdominal: Soft. Bowel sounds are normal. There is no tenderness.  Musculoskeletal: Normal range of motion. He exhibits tenderness.       The second toe is necrotic and dry-type necrosis no wheezing no wetness cap refill is a little delayed a little peon 2 seconds no palpable dorsalis pedis appears. Tibialis pulse the right foot has good cap refill.  Neurological: He is alert and oriented to person, place, and time. No cranial nerve deficit. He exhibits normal muscle tone.  Skin: Skin is warm. No rash noted. There is erythema.     ED Course  Procedures (including critical care time)  Labs Reviewed  CBC WITH DIFFERENTIAL - Abnormal; Notable for the following:    Monocytes Absolute 1.1 (*)     All other components within normal limits  BASIC METABOLIC PANEL - Abnormal; Notable for the following:    Glucose, Bld 316 (*)     Calcium 10.6 (*)     GFR calc non Af Amer 85 (*)     All other components within normal limits  URINALYSIS, ROUTINE W REFLEX MICROSCOPIC   Dg Foot Complete Left  02/21/2012  *RADIOLOGY REPORT*  Clinical Data: Pain and necrosis of the second toe.  LEFT FOOT - COMPLETE 3+ VIEW  Comparison: None.  Findings: There is no bony abnormality.  No evidence of fracture, dislocation or osteomyelitis.  There is extensive vascular calcification throughout the region.  IMPRESSION: Extensive vascular calcification.  No bony finding.   Original Report Authenticated By: Thomasenia Sales, M.D. ( 02/21/2012 11:23:14 )    Results for orders placed during the hospital encounter of 02/21/12  CBC WITH DIFFERENTIAL      Component Value Range   WBC 9.8  4.0 - 10.5 K/uL   RBC 5.75  4.22 - 5.81 MIL/uL   Hemoglobin 16.1  13.0 - 17.0 g/dL   HCT 19.1  47.8 - 29.5 %   MCV 85.0  78.0 - 100.0 fL   MCH 28.0  26.0 - 34.0 pg   MCHC 32.9  30.0 - 36.0 g/dL   RDW 62.1  30.8 - 65.7 %   Platelets 176  150 - 400 K/uL   Neutrophils Relative 65  43 - 77 %   Neutro Abs 6.3  1.7 - 7.7 K/uL   Lymphocytes Relative 22  12 - 46 %   Lymphs Abs 2.2  0.7 - 4.0 K/uL   Monocytes Relative 12  3 - 12 %   Monocytes Absolute 1.1 (*) 0.1 - 1.0 K/uL   Eosinophils Relative 1  0 - 5 %   Eosinophils Absolute 0.1  0.0 - 0.7 K/uL   Basophils Relative 0  0 - 1 %   Basophils Absolute 0.0  0.0 - 0.1 K/uL  BASIC METABOLIC PANEL      Component Value Range   Sodium 135  135 - 145 mEq/L   Potassium 4.8  3.5 - 5.1 mEq/L   Chloride 101  96 - 112 mEq/L   CO2 24  19 - 32 mEq/L   Glucose, Bld 316 (*) 70 - 99 mg/dL   BUN 23  6 - 23 mg/dL   Creatinine,  Ser 8.46  0.50 - 1.35 mg/dL   Calcium 96.2 (*) 8.4 - 10.5 mg/dL   GFR calc non Af Amer 85 (*) >90 mL/min   GFR calc Af Amer >  90  >90 mL/min   Results for orders placed during the hospital encounter of 02/21/12  CBC WITH DIFFERENTIAL      Component Value Range   WBC 9.8  4.0 - 10.5 K/uL   RBC 5.75  4.22 - 5.81 MIL/uL   Hemoglobin 16.1  13.0 - 17.0 g/dL   HCT 98.1  19.1 - 47.8 %   MCV 85.0  78.0 - 100.0 fL   MCH 28.0  26.0 - 34.0 pg   MCHC 32.9  30.0 - 36.0 g/dL   RDW 29.5  62.1 - 30.8 %   Platelets 176  150 - 400 K/uL   Neutrophils Relative 65  43 - 77 %   Neutro Abs 6.3  1.7 - 7.7 K/uL   Lymphocytes Relative 22  12 - 46 %   Lymphs Abs 2.2  0.7 - 4.0 K/uL   Monocytes Relative 12  3 - 12 %   Monocytes Absolute 1.1 (*) 0.1 - 1.0 K/uL   Eosinophils Relative 1  0 - 5 %   Eosinophils Absolute 0.1  0.0 - 0.7 K/uL   Basophils Relative 0  0 - 1 %   Basophils Absolute 0.0  0.0 - 0.1 K/uL  BASIC METABOLIC PANEL      Component Value Range   Sodium 135  135 - 145 mEq/L   Potassium 4.8  3.5 - 5.1 mEq/L   Chloride 101  96 - 112 mEq/L   CO2 24  19 - 32 mEq/L   Glucose, Bld 316 (*) 70 - 99 mg/dL   BUN 23  6 - 23 mg/dL   Creatinine, Ser 6.57  0.50 - 1.35 mg/dL   Calcium 84.6 (*) 8.4 - 10.5 mg/dL   GFR calc non Af Amer 85 (*) >90 mL/min   GFR calc Af Amer >90  >90 mL/min    Date: 02/21/2012  Rate: 88  Rhythm: normal sinus rhythm  QRS Axis: normal  Intervals: normal  ST/T Wave abnormalities: nonspecific ST changes  Conduction Disutrbances:none  Narrative Interpretation:   Old EKG Reviewed: unchanged  Compared to 11/23/11  1. Necrotic toe   2. Cellulitis of foot   3. History of renal transplant       MDM  The patient is sine admission discussed with [facility] hospitalist they will mid to MedSurg temporary middle orders done ankle-brachial index ordered by me. Patient still has pending chest x-ray urinalysis and EKG for admission. Patient requires admission for the left foot necrotic  toe and cellulitis 1 g of vancomycin initiated in the emergency department for the cellulitis. X-rays of the foot show no evidence of bony changes to the toe or foot. Because of these are most likely related to his long-standing diabetes with poor control.        Shelda Jakes, MD 02/21/12 1254

## 2012-02-21 NOTE — Consult Note (Signed)
[facility] [place]   Vascular Consult Note    Patient name: Trevor Thompson MRN: [id number] DOB: April 28, 1961 Sex: male     Reason for referral: Gangrenous left second toe  HISTORY OF PRESENT ILLNESS: Patient is a 51 year old black male with a long history of insulin-dependent diabetes dating back to age 89. He also has a history of end-stage renal disease a long history hemodialysis. He did undergo a catheter renal transplant in 2004 is been off dialysis for nearly 10 years. He has no prior history of lower extremity tissue loss. He noted loss of his left second toe nail approximately 2 weeks ago. He had been not sought attention regarding this and he noticed it has been becoming dusky since that time as well. Apparently yesterday he noticed some erythema on his foot and presented to the emergency room for further evaluation as was admitted for antibiotic treatment. He denies any fevers at home. He does have some soreness associated with the swelling in his left foot. He does ambulate normally. He denies cardiac disease denies history of stroke.  Past Medical History  Diagnosis Date  . Type 1 diabetes mellitus, uncontrolled   . Hypertension   . Renal disorder   . PVD (peripheral vascular disease)   . S/p cadaver renal transplant   . Cold intolerance   . 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 GERD (gastroesophageal reflux disease)   . 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 Hypertriglyceridemia   . Diabetic retinopathy     Past Surgical History  Procedure Date  . Kidney transplant     2004  . Eye surgery     cataract removed    History   Social History  . Marital Status: Married    Spouse Name: N/A    Number of Children: N/A  . Years of Education: N/A   Occupational History  . Not on file.   Social History Main Topics  . Smoking status: Never Smoker   . Smokeless tobacco: Not on file  . Alcohol Use: Yes  . Drug Use: No  .  Sexually Active:    Other Topics Concern  . Not on file   Social History Narrative   Pt is married and traveling thru [place]    Family History  Problem Relation Age of Onset  . Diabetes Mother   . Diabetes Father   . Lupus Sister     Allergies  Allergen Reactions  . Penicillins Other (See Comments)    Since birth    Prior to Admission medications   Medication Sig Start Date End Date Taking? Authorizing Provider  esomeprazole (NEXIUM) 40 MG capsule Take 40 mg by mouth daily before breakfast.   Yes Historical Provider, MD  insulin aspart (NOVOLOG) 100 UNIT/ML injection Inject 12 Units into the skin 2 (two) times daily before a meal.    Yes Historical Provider, MD  insulin glargine (LANTUS) 100 UNIT/ML injection Inject 32 Units into the skin 2 (two) times daily.   Yes Historical Provider, MD  metoprolol tartrate (LOPRESSOR) 25 MG tablet Take 25 mg by mouth 2 (two) times daily.   Yes Historical Provider, MD  mycophenolate (MYFORTIC) 360 MG TBEC Take 360 mg by mouth 3 (three) times daily.   Yes Historical Provider, MD  omega-3 acid ethyl esters (LOVAZA) 1 G capsule Take 2 g by mouth 2 (two) times daily.   Yes Historical Provider, MD  predniSONE (DELTASONE) 5 MG  tablet Take 5 mg by mouth daily.   Yes Historical Provider, MD  tacrolimus (PROGRAF) 1 MG capsule Take 4 mg by mouth 2 (two) times daily.   Yes Historical Provider, MD     REVIEW OF SYSTEMS: Reviewed in his history and physical with no additions.  PHYSICAL EXAMINATION:  Filed Vitals:   02/21/12 1434  BP: 133/85  Pulse: 91  Temp: 98.5 F (36.9 C)  Resp: 20    General: The patient appears their stated age. Pulmonary: There is a good air exchange bilaterally without wheezing or rales. Abdomen: Soft and non-tender with normal pitch bowel sounds. Moderately obese. Does have a right lower quadrant scar from his transplant. Musculoskeletal: There are no major deformities.  There is no significant extremity  pain. Neurologic: No focal weakness or paresthesias are detected, Skin: There are no ulcer or rashes noted. Psychiatric: The patient has normal affect. Cardiovascular: There is a regular rate and rhythm without significant murmur appreciated. Pulse status palpable radial and brachial pulses bilaterally. He does have palpable femoral and a palpable popliteal pulses bilaterally. He has absent dorsalis pedis and posterior tibial pulse bilaterally Left foot: He does have dry gangrene of his left second toe standing to near the webspace. There is mild swelling and mild erythema of the dorsum of his foot. 1 a separate issue on the lateral aspect of his left foot lateral to the fifth metatarsal head he does have a blister with no evidence of cellulitis. This does have the appearance of tophaceous gout but the patient has no history of this. There is no significant tenderness associated with this and no surrounding erythema. Diagnostic Studies: Noninvasive vascular lab studies reveal calcified vessels which are not compressible therefore unreliable ankle arm indices. He does have triphasic waveforms in his medial vessels    Medication Changes: None  Assessment:  Long-standing diabetes and renal failure. Successful ^ renal transplant in 2004. Does have dry gangrene over his left second toe and a blister over the lateral aspect of his fifth metatarsal head. I have explained the need for arteriography for further evaluation. Agree with admission for antibiotic and observation regarding his foot. I discussed this with the patient  Plan: Lower extremity arteriogram for further evaluation of arterial flow to his left foot. This will be scheduled either Tuesday or Wednesday of this week. I did explain the slight risk of worsening renal function and that he would have a minimization of the volume of IV contrast required to evaluate his lower extremity vasculature  Cornie Herrington 8/19/20134:51 PM

## 2012-02-21 NOTE — ED Notes (Signed)
Report called to Mindy, RN on 6700 and advised patient was going to echo for exam and then to be transported from echo to floor.  This plan was confirmed with staff at echo studies, patient in NAD at time of departure to echo, IV infusing with no difficulty

## 2012-02-21 NOTE — H&P (Signed)
History and Physical Examination  Date: 02/21/2012  Patient name: Trevor Thompson Medical record number: [id number] Date of birth: 06-13-1961 Age: 51 y.o. Gender: male PCP: DEFAULT,PROVIDER, MD  Chief Complaint:  Chief Complaint  Patient presents with  . Foot Pain     History of Present Illness: Trevor Thompson is an 51 y.o. male with with a 25 year history of poorly controlled type 1 diabetes mellitus with complications of neuropathy, retinopathy and nephropathy.  The patient is status post renal transplant.  The patient is currently traveling in the [place] area and reports progressive pain over the past several days of the left foot.  The patient reports that he lost the toenail on the left second toenail and over the course of 2 days the toe became progressively necrotic and more painful.  The patient became concerned because he noticed a red streak going up the left leg.  The patient reports increasing pain 6/10 on the plantar surface of the left foot.  The patient also reports painful toes.  The patient reports progressive cold intolerance.  He has also had some fluctuating blood sugars that have been extreme.  The patient reports no history of previous amputation.  The patient reports that he was diagnosed with type 1 diabetes mellitus at the age of 22.  The patient denies fever.  Hospitalization was requested for further evaluation and management.  Past Medical History Past Medical History  Diagnosis Date  . Type 1 diabetes mellitus, uncontrolled   . Hypertension   . Renal disorder   . PVD (peripheral vascular disease)   . S/p cadaver renal transplant   . Cold intolerance   . 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 GERD (gastroesophageal reflux disease)   . 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 Hypertriglyceridemia   . Diabetic retinopathy     Past Surgical History Past Surgical History  Procedure Date  . Kidney transplant     2004  . Eye surgery     cataract removed      Home Meds: Prior to Admission medications   Medication Sig Start Date End Date Taking? Authorizing Provider  esomeprazole (NEXIUM) 40 MG capsule Take 40 mg by mouth daily before breakfast.   Yes Historical Provider, MD  insulin aspart (NOVOLOG) 100 UNIT/ML injection Inject 12 Units into the skin 2 (two) times daily before a meal.    Yes Historical Provider, MD  insulin glargine (LANTUS) 100 UNIT/ML injection Inject 32 Units into the skin 2 (two) times daily.   Yes Historical Provider, MD  metoprolol tartrate (LOPRESSOR) 25 MG tablet Take 25 mg by mouth 2 (two) times daily.   Yes Historical Provider, MD  mycophenolate (MYFORTIC) 360 MG TBEC Take 360 mg by mouth 3 (three) times daily.   Yes Historical Provider, MD  omega-3 acid ethyl esters (LOVAZA) 1 G capsule Take 2 g by mouth 2 (two) times daily.   Yes Historical Provider, MD  predniSONE (DELTASONE) 5 MG tablet Take 5 mg by mouth daily.   Yes Historical Provider, MD  tacrolimus (PROGRAF) 1 MG capsule Take 4 mg by mouth 2 (two) times daily.   Yes Historical Provider, MD   Allergies: Penicillins  Social History:  History   Social History  . Marital Status: Married    Spouse Name: N/A    Number of Children: N/A  . Years of Education: N/A   Occupational History  . Not on file.   Social History Main Topics  . Smoking status: Never Smoker   .  Smokeless tobacco: Not on file  . Alcohol Use: Yes  . Drug Use: No  . Sexually Active:    Other Topics Concern  . Not on file   Social History Narrative   Pt is married and traveling thru [place]   Family History:  Family History  Problem Relation Age of Onset  . Diabetes Mother   . Diabetes Father   . Lupus Sister     Review of Systems: Pertinent items are noted in HPI. All other systems reviewed and reported as negative.   Physical Exam: Blood pressure 133/85, pulse 91, temperature 98.5 F (36.9 C), temperature source Oral, resp. rate 20, SpO2 96.00%. General  appearance: alert, cooperative, appears stated age and no distress Head: Normocephalic, without obvious abnormality, atraumatic Ears: normal TM's and external ear canals both ears Nose: Nares normal. Septum midline. Mucosa normal. No drainage or sinus tenderness., no discharge Throat: normal findings: lips normal without lesions, buccal mucosa normal and tongue midline and normal Neck: no adenopathy, no carotid bruit, no JVD, supple, symmetrical, trachea midline and thyroid not enlarged, symmetric, no tenderness/mass/nodules Lungs: clear to auscultation bilaterally and normal percussion bilaterally Chest wall: no tenderness Heart: regular rate and rhythm, S1, S2 normal, no murmur, click, rub or gallop Abdomen: soft, non-tender; bowel sounds normal; no masses,  no organomegaly Extremities: necrotic tip of second toe left foot, unable to palpate pedal pulses, tenderness with palpation of plantar surface of left foot Pulses: unable to palpate pedal pulses left foot but both lower extremities warm Skin: findings as above  Lab  And Imaging results:  Results for orders placed during the hospital encounter of 02/21/12 (from the past 24 hour(s))  CBC WITH DIFFERENTIAL     Status: Abnormal   Collection Time   02/21/12 10:14 AM      Component Value Range   WBC 9.8  4.0 - 10.5 K/uL   RBC 5.75  4.22 - 5.81 MIL/uL   Hemoglobin 16.1  13.0 - 17.0 g/dL   HCT 40.9  81.1 - 91.4 %   MCV 85.0  78.0 - 100.0 fL   MCH 28.0  26.0 - 34.0 pg   MCHC 32.9  30.0 - 36.0 g/dL   RDW 78.2  95.6 - 21.3 %   Platelets 176  150 - 400 K/uL   Neutrophils Relative 65  43 - 77 %   Neutro Abs 6.3  1.7 - 7.7 K/uL   Lymphocytes Relative 22  12 - 46 %   Lymphs Abs 2.2  0.7 - 4.0 K/uL   Monocytes Relative 12  3 - 12 %   Monocytes Absolute 1.1 (*) 0.1 - 1.0 K/uL   Eosinophils Relative 1  0 - 5 %   Eosinophils Absolute 0.1  0.0 - 0.7 K/uL   Basophils Relative 0  0 - 1 %   Basophils Absolute 0.0  0.0 - 0.1 K/uL  BASIC  METABOLIC PANEL     Status: Abnormal   Collection Time   02/21/12 10:14 AM      Component Value Range   Sodium 135  135 - 145 mEq/L   Potassium 4.8  3.5 - 5.1 mEq/L   Chloride 101  96 - 112 mEq/L   CO2 24  19 - 32 mEq/L   Glucose, Bld 316 (*) 70 - 99 mg/dL   BUN 23  6 - 23 mg/dL   Creatinine, Ser 0.86  0.50 - 1.35 mg/dL   Calcium 57.8 (*) 8.4 - 10.5 mg/dL   GFR  calc non Af Amer 85 (*) >90 mL/min   GFR calc Af Amer >90  >90 mL/min  GLUCOSE, CAPILLARY     Status: Abnormal   Collection Time   02/21/12  2:32 PM      Component Value Range   Glucose-Capillary 262 (*) 70 - 99 mg/dL   Comment 1 Documented in Chart     Comment 2 Notify RN       Impression    *Necrotic toe left foot   Cellulitis of left foot  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  Insulin dependent type 2 diabetes mellitus, uncontrolled  HTN (hypertension)  Dehydration as evidenced by hemoconcentration  Left foot pain  Type 1 diabetes mellitus, uncontrolled  Hypertension  S/p cadaver renal transplant  Cold intolerance  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    Standley Dakins MD [facility] [place], [place] [phone] 02/21/2012, 3:42 PM

## 2012-02-22 ENCOUNTER — Encounter (HOSPITAL_COMMUNITY)
Admission: EM | Disposition: A | Discharge status: Home / Self Care | Payer: Self-pay | Source: Home / Self Care | Attending: Internal Medicine

## 2012-02-22 HISTORY — PX: ABDOMINAL AORTAGRAM: SHX5454

## 2012-02-22 LAB — COMPREHENSIVE METABOLIC PANEL
ALT: 23 U/L (ref 0–53)
BUN: 20 mg/dL (ref 6–23)
CO2: 26 mEq/L (ref 19–32)
Calcium: 9.4 mg/dL (ref 8.4–10.5)
Creatinine, Ser: 0.98 mg/dL (ref 0.50–1.35)
GFR calc Af Amer: >90 mL/min (ref >90)
GFR calc non Af Amer: >90 mL/min (ref >90)
Glucose, Bld: 179 mg/dL — ABNORMAL HIGH (ref 70–99)
Total Protein: 6.5 g/dL (ref 6.0–8.3)

## 2012-02-22 LAB — CBC
Hemoglobin: 13.6 g/dL (ref 13.0–17.0)
MCHC: 32.4 g/dL (ref 30.0–36.0)
RBC: 4.98 MIL/uL (ref 4.22–5.81)
WBC: 13.5 10*3/uL — ABNORMAL HIGH (ref 4.0–10.5)

## 2012-02-22 LAB — GLUCOSE, CAPILLARY
Glucose-Capillary: 199 mg/dL — ABNORMAL HIGH (ref 70–99)
Glucose-Capillary: 215 mg/dL — ABNORMAL HIGH (ref 70–99)

## 2012-02-22 LAB — LIPID PANEL
LDL Cholesterol: 41 mg/dL (ref 0–99)
Triglycerides: 128 mg/dL (ref <150)

## 2012-02-22 LAB — TSH: TSH: 0.989 u[IU]/mL (ref 0.350–4.500)

## 2012-02-22 SURGERY — ABDOMINAL AORTAGRAM
Anesthesia: LOCAL

## 2012-02-22 MED ORDER — CLONIDINE HCL 0.2 MG PO TABS
0.2000 mg | ORAL_TABLET | ORAL | Status: DC | PRN
  Filled 2012-02-22 (×2): qty 1

## 2012-02-22 MED ORDER — OXYCODONE HCL 5 MG PO TABS
5.0000 mg | ORAL_TABLET | ORAL | Status: DC | PRN
  Administered 2012-02-25: 10 mg via ORAL
  Filled 2012-02-22: qty 2

## 2012-02-22 MED ORDER — ALUM & MAG HYDROXIDE-SIMETH 200-200-20 MG/5ML PO SUSP: 15.0000 mL | ORAL | Status: DC | PRN

## 2012-02-22 MED ORDER — ONDANSETRON HCL 4 MG/2ML IJ SOLN: 4.0000 mg | Freq: Four times a day (QID) | INTRAMUSCULAR | Status: DC | PRN

## 2012-02-22 MED ORDER — HEPARIN (PORCINE) IN NACL 2-0.9 UNIT/ML-% IJ SOLN
INTRAMUSCULAR | Status: AC
  Filled 2012-02-22: qty 2000

## 2012-02-22 MED ORDER — INSULIN ASPART 100 UNIT/ML ~~LOC~~ SOLN
10.0000 [IU] | Freq: Three times a day (TID) | SUBCUTANEOUS | Status: DC
  Administered 2012-02-22 – 2012-03-01 (×12): 10 [IU] via SUBCUTANEOUS

## 2012-02-22 MED ORDER — MIDAZOLAM HCL 2 MG/2ML IJ SOLN
INTRAMUSCULAR | Status: AC
  Filled 2012-02-22: qty 2

## 2012-02-22 MED ORDER — FENTANYL CITRATE 0.05 MG/ML IJ SOLN
INTRAMUSCULAR | Status: AC
  Filled 2012-02-22: qty 2

## 2012-02-22 MED ORDER — ACETAMINOPHEN 325 MG PO TABS
325.0000 mg | ORAL_TABLET | ORAL | Status: DC | PRN
  Administered 2012-02-25 – 2012-02-29 (×2): 650 mg via ORAL
  Filled 2012-02-22 (×2): qty 2

## 2012-02-22 MED ORDER — METOPROLOL TARTRATE 1 MG/ML IV SOLN
2.0000 mg | INTRAVENOUS | Status: DC | PRN
  Filled 2012-02-22: qty 5

## 2012-02-22 MED ORDER — LIDOCAINE HCL (PF) 1 % IJ SOLN
INTRAMUSCULAR | Status: AC
  Filled 2012-02-22: qty 30

## 2012-02-22 MED ORDER — GUAIFENESIN-DM 100-10 MG/5ML PO SYRP: 15.0000 mL | ORAL_SOLUTION | ORAL | Status: DC | PRN

## 2012-02-22 MED ORDER — MORPHINE SULFATE 2 MG/ML IJ SOLN: 2.0000 mg | INTRAMUSCULAR | Status: DC | PRN

## 2012-02-22 MED ORDER — SODIUM CHLORIDE 0.9 % IV SOLN
INTRAVENOUS | Status: DC
  Administered 2012-02-22 – 2012-02-24 (×4): via INTRAVENOUS
  Administered 2012-02-25 – 2012-02-26 (×2): 75 mL/h via INTRAVENOUS

## 2012-02-22 MED ORDER — PHENOL 1.4 % MT LIQD: 1.0000 | OROMUCOSAL | Status: DC | PRN

## 2012-02-22 MED ORDER — ACETAMINOPHEN 650 MG RE SUPP: 325.0000 mg | RECTAL | Status: DC | PRN

## 2012-02-22 NOTE — Evaluation (Signed)
Physical Therapy Evaluation Patient Details Name: Trevor Thompson MRN: [id number] DOB: January 27, 1961 Today's Date: 02/22/2012 Time: 4098-1191 PT Time Calculation (min): 15 min  PT Assessment / Plan / Recommendation Clinical Impression  Pt admitted with left 2nd toe necrosis and ulceration of 5th met head. Pt ambulating with weight on heel of left foot without assistive device and states he is mobilizing as well as he was PTA. Pt currently without therapy needs and encouraged to mobilize as tolerated. Pt unable to ambulate in the hall today due to restrictions post femoral catherization but pt states no difficulties and denies need for assessment with hall ambulation or stairs. Signing off    PT Assessment  Patent does not need any further PT services    Follow Up Recommendations  No PT follow up    Barriers to Discharge        Equipment Recommendations  None recommended by PT    Recommendations for Other Services     Frequency      Precautions / Restrictions Precautions Precautions: None   Pertinent Vitals/Pain 4/10 left foot      Mobility  Bed Mobility Bed Mobility: Supine to Sit Supine to Sit: 6: Modified independent (Device/Increase time);HOB elevated Transfers Transfers: Sit to Stand;Stand to Sit Sit to Stand: 6: Modified independent (Device/Increase time);From toilet;From bed Stand to Sit: 6: Modified independent (Device/Increase time);To toilet;To bed Ambulation/Gait Ambulation/Gait Assistance: 6: Modified independent (Device/Increase time) Ambulation Distance (Feet): 15 Feet (15'x 2) Assistive device: None Ambulation/Gait Assistance Details: Pt maintains weight on heel of left foot throughout ambulation with slightly decreased stance on LLE Gait Pattern: Decreased stance time - left;Step-through pattern;Within Functional Limits Stairs: No    Exercises     PT Diagnosis:    PT Problem List:   PT Treatment Interventions:     PT Goals    Visit Information  Last PT  Received On: 02/22/12 Assistance Needed: +1    Subjective Data  Subjective: I just need to get to the bathroom Patient Stated Goal: get that foot to stop hurting   Prior Functioning  Home Living Lives With: Spouse Available Help at Discharge: Family;Available 24 hours/day Type of Home: House Home Access: Stairs to enter [employer] of Steps: 2 Entrance Stairs-Rails: None Home Layout: Two level;Bed/bath upstairs Alternate Level Stairs-Number of Steps: 14 Alternate Level Stairs-Rails: Right Bathroom Shower/Tub: Engineer, manufacturing systems: Standard Home Adaptive Equipment: None Prior Function Level of Independence: Independent Able to Take Stairs?: Yes Driving: Yes Communication Communication: No difficulties    Cognition  Overall Cognitive Status: Appears within functional limits for tasks assessed/performed Arousal/Alertness: Awake/alert Orientation Level: Appears intact for tasks assessed Behavior During Session: [facility] for tasks performed    Extremity/Trunk Assessment Right Upper Extremity Assessment RUE ROM/Strength/Tone: [facility] for tasks assessed Left Upper Extremity Assessment LUE ROM/Strength/Tone: [facility] for tasks assessed Right Lower Extremity Assessment RLE ROM/Strength/Tone: [facility] for tasks assessed Left Lower Extremity Assessment LLE ROM/Strength/Tone: [facility] for tasks assessed   Balance    End of Session PT - End of Session Activity Tolerance: Patient tolerated treatment well Patient left: in chair;with call bell/phone within reach  GP     Delorse Lek 02/22/2012, 2:22 PM  Delaney Meigs, PT [phone]

## 2012-02-22 NOTE — H&P (Signed)
      [facility] [place]   Vascular Consult Note    Patient name: Trevor Thompson MRN: [id number] DOB: 10/09/1960 Sex: male     Reason for referral: Gangrenous left second toe  HISTORY OF PRESENT ILLNESS: Patient is a 50-year-old black male with a long history of insulin-dependent diabetes dating back to age 24. He also has a history of end-stage renal disease a long history hemodialysis. He did undergo a catheter renal transplant in 2004 is been off dialysis for nearly 10 years. He has no prior history of lower extremity tissue loss. He noted loss of his left second toe nail approximately 2 weeks ago. He had been not sought attention regarding this and he noticed it has been becoming dusky since that time as well. Apparently yesterday he noticed some erythema on his foot and presented to the emergency room for further evaluation as was admitted for antibiotic treatment. He denies any fevers at home. He does have some soreness associated with the swelling in his left foot. He does ambulate normally. He denies cardiac disease denies history of stroke.  Past Medical History  Diagnosis Date  . Type 1 diabetes mellitus, uncontrolled   . Hypertension   . Renal disorder   . PVD (peripheral vascular disease)   . S/p cadaver renal transplant   . Cold intolerance   . 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 GERD (gastroesophageal reflux disease)   . 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 Hypertriglyceridemia   . Diabetic retinopathy     Past Surgical History  Procedure Date  . Kidney transplant     2004  . Eye surgery     cataract removed    History   Social History  . Marital Status: Married    Spouse Name: N/A    Number of Children: N/A  . Years of Education: N/A   Occupational History  . Not on file.   Social History Main Topics  . Smoking status: Never Smoker   . Smokeless tobacco: Not on file  . Alcohol Use: Yes  . Drug Use: No  .  Sexually Active:    Other Topics Concern  . Not on file   Social History Narrative   Pt is married and traveling thru [place]    Family History  Problem Relation Age of Onset  . Diabetes Mother   . Diabetes Father   . Lupus Sister     Allergies  Allergen Reactions  . Penicillins Other (See Comments)    Since birth    Prior to Admission medications   Medication Sig Start Date End Date Taking? Authorizing Provider  esomeprazole (NEXIUM) 40 MG capsule Take 40 mg by mouth daily before breakfast.   Yes Historical Provider, MD  insulin aspart (NOVOLOG) 100 UNIT/ML injection Inject 12 Units into the skin 2 (two) times daily before a meal.    Yes Historical Provider, MD  insulin glargine (LANTUS) 100 UNIT/ML injection Inject 32 Units into the skin 2 (two) times daily.   Yes Historical Provider, MD  metoprolol tartrate (LOPRESSOR) 25 MG tablet Take 25 mg by mouth 2 (two) times daily.   Yes Historical Provider, MD  mycophenolate (MYFORTIC) 360 MG TBEC Take 360 mg by mouth 3 (three) times daily.   Yes Historical Provider, MD  omega-3 acid ethyl esters (LOVAZA) 1 G capsule Take 2 g by mouth 2 (two) times daily.   Yes Historical Provider, MD  predniSONE (DELTASONE) 5 MG   tablet Take 5 mg by mouth daily.   Yes Historical Provider, MD  tacrolimus (PROGRAF) 1 MG capsule Take 4 mg by mouth 2 (two) times daily.   Yes Historical Provider, MD     REVIEW OF SYSTEMS: Reviewed in his history and physical with no additions.  PHYSICAL EXAMINATION:  Filed Vitals:   02/21/12 1434  BP: 133/85  Pulse: 91  Temp: 98.5 F (36.9 C)  Resp: 20    General: The patient appears their stated age. Pulmonary: There is a good air exchange bilaterally without wheezing or rales. Abdomen: Soft and non-tender with normal pitch bowel sounds. Moderately obese. Does have a right lower quadrant scar from his transplant. Musculoskeletal: There are no major deformities.  There is no significant extremity  pain. Neurologic: No focal weakness or paresthesias are detected, Skin: There are no ulcer or rashes noted. Psychiatric: The patient has normal affect. Cardiovascular: There is a regular rate and rhythm without significant murmur appreciated. Pulse status palpable radial and brachial pulses bilaterally. He does have palpable femoral and a palpable popliteal pulses bilaterally. He has absent dorsalis pedis and posterior tibial pulse bilaterally Left foot: He does have dry gangrene of his left second toe standing to near the webspace. There is mild swelling and mild erythema of the dorsum of his foot. 1 a separate issue on the lateral aspect of his left foot lateral to the fifth metatarsal head he does have a blister with no evidence of cellulitis. This does have the appearance of tophaceous gout but the patient has no history of this. There is no significant tenderness associated with this and no surrounding erythema. Diagnostic Studies: Noninvasive vascular lab studies reveal calcified vessels which are not compressible therefore unreliable ankle arm indices. He does have triphasic waveforms in his medial vessels    Medication Changes: None  Assessment:  Long-standing diabetes and renal failure. Successful ^ renal transplant in 2004. Does have dry gangrene over his left second toe and a blister over the lateral aspect of his fifth metatarsal head. I have explained the need for arteriography for further evaluation. Agree with admission for antibiotic and observation regarding his foot. I discussed this with the patient  Plan: Lower extremity arteriogram for further evaluation of arterial flow to his left foot. This will be scheduled either Tuesday or Wednesday of this week. I did explain the slight risk of worsening renal function and that he would have a minimization of the volume of IV contrast required to evaluate his lower extremity vasculature  EARLY, TODD 8/19/20134:51 PM     over the lateral aspect of his fifth metatarsal head. I have explained the need for arteriography for further evaluation. Agree with admission for antibiotic and observation regarding his foot. I discussed this with the patient   Plan: Lower extremity arteriogram for further evaluation of arterial flow to his left foot. This will  be scheduled either Tuesday or Wednesday of this week. I did explain the slight risk of worsening renal function and that he would have a minimization of the volume of IV contrast required to evaluate his lower extremity vasculature   EARLY, TODD 8/19/20134:51 PM

## 2012-02-22 NOTE — Progress Notes (Signed)
[facility] Progress Note  02/22/2012   Subjective: Pt still having significant left foot pain and not able to walk on it without severe pain,  Had arteriogram study today  Objective:  Vital signs in last 24 hours: Filed Vitals:   02/22/12 0820 02/22/12 1026 02/22/12 1400 02/22/12 1809  BP:  150/90 136/84 141/90  Pulse: 80 87 85 90  Temp:  98.9 F (37.2 C) 98.7 F (37.1 C) 98.1 F (36.7 C)  TempSrc:  Oral Oral Oral  Resp:  18 18 20   Height:      Weight:      SpO2:  94% 95% 98%   Weight change:   Intake/Output Summary (Last 24 hours) at 02/22/12 1814 Last data filed at 02/22/12 1812  Gross per 24 hour  Intake    600 ml  Output   1500 ml  Net   -900 ml   Lab Results  Component Value Date   HGBA1C 11.2* 02/21/2012   Lab Results  Component Value Date   LDLCALC 41 02/22/2012   CREATININE 0.98 02/22/2012    Review of Systems As above, otherwise all reviewed and reported negative  Physical Exam General - awake, no distress, cooperative HEENT - NCAT, MMM Lungs - BBS, CTA CV - normal s1, s2 sounds Abd - soft, nondistended, no masses, nontender Ext - necrotic tip of second toe left foot, unable to palpate pedal pulses, tenderness with palpation of plantar surface of left foot   Lab Results: Results for orders placed during the hospital encounter of 02/21/12 (from the past 24 hour(s))  GLUCOSE, CAPILLARY     Status: Abnormal   Collection Time   02/21/12  9:05 PM      Component Value Range   Glucose-Capillary 199 (*) 70 - 99 mg/dL  GLUCOSE, CAPILLARY     Status: Abnormal   Collection Time   02/22/12  3:22 AM      Component Value Range   Glucose-Capillary 164 (*) 70 - 99 mg/dL  COMPREHENSIVE METABOLIC PANEL     Status: Abnormal   Collection Time   02/22/12  4:42 AM      Component Value Range   Sodium 135  135 - 145 mEq/L   Potassium 4.1  3.5 - 5.1 mEq/L   Chloride 101  96 - 112 mEq/L   CO2 26  19 - 32 mEq/L   Glucose, Bld 179 (*) 70 - 99 mg/dL   BUN 20   6 - 23 mg/dL   Creatinine, Ser 1.61  0.50 - 1.35 mg/dL   Calcium 9.4  8.4 - 09.6 mg/dL   Total Protein 6.5  6.0 - 8.3 g/dL   Albumin 2.4 (*) 3.5 - 5.2 g/dL   AST 20  0 - 37 U/L   ALT 23  0 - 53 U/L   Alkaline Phosphatase 79  39 - 117 U/L   Total Bilirubin 0.5  0.3 - 1.2 mg/dL   GFR calc non Af Amer >90  >90 mL/min   GFR calc Af Amer >90  >90 mL/min  CBC     Status: Abnormal   Collection Time   02/22/12  4:42 AM      Component Value Range   WBC 13.5 (*) 4.0 - 10.5 K/uL   RBC 4.98  4.22 - 5.81 MIL/uL   Hemoglobin 13.6  13.0 - 17.0 g/dL   HCT 04.5  40.9 - 81.1 %   MCV 84.3  78.0 - 100.0 fL   MCH 27.3  26.0 -  34.0 pg   MCHC 32.4  30.0 - 36.0 g/dL   RDW 16.1  09.6 - 04.5 %   Platelets 166  150 - 400 K/uL  LIPID PANEL     Status: Normal   Collection Time   02/22/12  4:42 AM      Component Value Range   Cholesterol 131  0 - 200 mg/dL   Triglycerides 409  <811 mg/dL   HDL 64  >91 mg/dL   Total CHOL/HDL Ratio 2.0     VLDL 26  0 - 40 mg/dL   LDL Cholesterol 41  0 - 99 mg/dL  GLUCOSE, CAPILLARY     Status: Abnormal   Collection Time   02/22/12  9:12 AM      Component Value Range   Glucose-Capillary 236 (*) 70 - 99 mg/dL  GLUCOSE, CAPILLARY     Status: Abnormal   Collection Time   02/22/12 10:18 AM      Component Value Range   Glucose-Capillary 207 (*) 70 - 99 mg/dL  GLUCOSE, CAPILLARY     Status: Abnormal   Collection Time   02/22/12 11:52 AM      Component Value Range   Glucose-Capillary 215 (*) 70 - 99 mg/dL   Comment 1 Notify RN     Comment 2 Documented in Chart      Micro Results: No results found for this or any previous visit (from the past 240 hour(s)).  Medications:  Scheduled Meds:   . docusate sodium  100 mg Oral BID  . fentaNYL      . heparin      . heparin  5,000 Units Subcutaneous Q8H  . insulin aspart  0-15 Units Subcutaneous TID WC  . insulin aspart  10 Units Subcutaneous TID WC  . insulin glargine  32 Units Subcutaneous BID  . lidocaine      .  metoprolol tartrate  25 mg Oral BID  . midazolam      . moxifloxacin  400 mg Oral Q2000  . mycophenolate  360 mg Oral TID  . omega-3 acid ethyl esters  2 g Oral BID  . pantoprazole  40 mg Oral Q breakfast  . predniSONE  5 mg Oral Q breakfast  . saccharomyces boulardii  250 mg Oral BID  . sulfamethoxazole-trimethoprim  2 tablet Oral Q12H  . tacrolimus  4 mg Oral BID  . DISCONTD: insulin aspart  6 Units Subcutaneous TID WC   Continuous Infusions:   . sodium chloride 75 mL/hr at 02/22/12 1705  . DISCONTD: sodium chloride 100 mL/hr at 02/22/12 0406   PRN Meds:.acetaminophen, acetaminophen, alum & mag hydroxide-simeth, cloNIDine, guaiFENesin-dextromethorphan, HYDROcodone-acetaminophen, metoprolol, morphine injection, ondansetron (ZOFRAN) IV, ondansetron, oxyCODONE, phenol, DISCONTD:  morphine injection, DISCONTD: ondansetron  Assessment/Plan: Poorly controlled type 1 DM - as evidenced by A1c of >11% - continue basal bolus, increase mealtime novolog to 10 units TIDAC plus supplemental coverage - pt is at high risk for losing his transplanted kidney from poorly controlled disease  Dry Gangrene left toe - appreciate vascular surgery evaluation - awaiting for final recs - pt had arteriogram study today, awaiting results and recs  HTN - blood pressures better controlled  - resume home meds  Dehydration - continue gentle IVFs  Left foot pain/claudication - pain meds as ordered  Hypertriglyceridemia - will not improve until glycemic control is improved  GERD - continue protonix  S/p renal transplant - resume home meds   LOS: 1 day   Clanford Johnson 02/22/2012, 6:14  PM  Cleora Fleet, MD, CDE, FAAFP [facility] [place], [place]  [phone]

## 2012-02-22 NOTE — Op Note (Signed)
[facility]  Patient name: Trevor Thompson MRN: [id number] DOB: 07-18-1960 Sex: male  02/21/2012 - 02/22/2012 Pre-operative Diagnosis: Left foot ulcer Post-operative diagnosis:  Same Surgeon:  Jorge Ny Procedure Performed:  1.  ultrasound access right femoral artery  2.  abdominal aortogram   3.  bilateral lower extremity runoff  4.  second order catheterization    Indications:  The patient has dry gangrene of his left second toe and ulceration of the fifth metatarsal head. He is status post renal transplant with diabetes. Ultrasound revealed triphasic waveforms however his vessels were heavily calcified. He does not have palpable pedal pulses. He therefore comes in for arteriogram to better evaluate his blood supply.  Procedure:  The patient was identified in the holding area and taken to room 8.  The patient was then placed supine on the table and prepped and draped in the usual sterile fashion.  A time out was called.  Ultrasound was used to evaluate the right common femoral artery.  It was patent .  A digital ultrasound image was acquired.  A micropuncture needle was used to access the right common femoral artery under ultrasound guidance.  An 018 wire was advanced without resistance and a micropuncture sheath was placed.  The 018 wire was removed and a benson wire was placed.  The micropuncture sheath was exchanged for a 5 french sheath.  An omniflush catheter was advanced over the wire to the level of L-1.  An abdominal angiogram was obtained.  Next, using the omniflush catheter and a benson wire, the aortic bifurcation was crossed and the catheter was placed into theleft external iliac artery and left runoff was obtained.  right runoff was performed via retrograde sheath injections.  Findings:   Aortogram:  The visualized portions of the suprarenal abdominal aorta showed no significant disease. There is approximately a 50% left renal artery stenosis. The  right renal artery is widely patent. The infrarenal abdominal aorta is widely patent without stenosis. Bilateral common external and internal iliac arteries are widely patent.  Right Lower Extremity:  The right common femoral artery is widely patent the right profunda femoral artery is widely patent right superficial femoral artery is widely patent. The right popliteal artery is widely patent throughout it's course there is three-vessel runoff with diffuse disease out onto the foot.  Left Lower Extremity:  The left common femoral artery is widely patent the left profunda femoral artery is widely patent the left superficial femoral and popliteal artery are widely patent. There is three-vessel runoff to the ankle. There is diffuse disease out onto the foot there is opacification of the plantar arch however these vessels appeared diminutive as to the digital arteries.  Intervention:  None  Impression:  #1  no significant inflow outflow or runoff disease to the ankle however there is diffuse disease within all vessels below the ankle  #2  no options for revascularization as no significant stenosis is identified proximal to the ankle    V. Durene Cal, M.D. [facility] [place] Office: [phone] Pager:  [phone]

## 2012-02-23 DIAGNOSIS — E1152 Type 2 diabetes mellitus with diabetic peripheral angiopathy with gangrene: Secondary | ICD-10-CM | POA: Diagnosis present

## 2012-02-23 DIAGNOSIS — E1159 Type 2 diabetes mellitus with other circulatory complications: Secondary | ICD-10-CM

## 2012-02-23 DIAGNOSIS — D899 Disorder involving the immune mechanism, unspecified: Secondary | ICD-10-CM

## 2012-02-23 DIAGNOSIS — I798 Other disorders of arteries, arterioles and capillaries in diseases classified elsewhere: Secondary | ICD-10-CM

## 2012-02-23 LAB — GLUCOSE, CAPILLARY
Glucose-Capillary: 113 mg/dL — ABNORMAL HIGH (ref 70–99)
Glucose-Capillary: 184 mg/dL — ABNORMAL HIGH (ref 70–99)
Glucose-Capillary: 109 mg/dL — ABNORMAL HIGH (ref 70–99)
Glucose-Capillary: 120 mg/dL — ABNORMAL HIGH (ref 70–99)
Glucose-Capillary: 139 mg/dL — ABNORMAL HIGH (ref 70–99)

## 2012-02-23 LAB — CBC
HCT: 42.6 % (ref 39.0–52.0)
Hemoglobin: 13.5 g/dL (ref 13.0–17.0)
RBC: 5.02 MIL/uL (ref 4.22–5.81)
WBC: 14.2 10*3/uL — ABNORMAL HIGH (ref 4.0–10.5)

## 2012-02-23 LAB — BASIC METABOLIC PANEL
BUN: 15 mg/dL (ref 6–23)
Chloride: 103 mEq/L (ref 96–112)
GFR calc Af Amer: 79 mL/min — ABNORMAL LOW (ref >90)
Potassium: 4 mEq/L (ref 3.5–5.1)

## 2012-02-23 MED ORDER — GABAPENTIN 300 MG PO CAPS
300.0000 mg | ORAL_CAPSULE | Freq: Two times a day (BID) | ORAL | Status: AC
  Administered 2012-02-24 (×2): 300 mg via ORAL
  Filled 2012-02-23 (×2): qty 1

## 2012-02-23 MED ORDER — GABAPENTIN 300 MG PO CAPS
300.0000 mg | ORAL_CAPSULE | Freq: Every day | ORAL | Status: AC
  Administered 2012-02-23: 300 mg via ORAL
  Filled 2012-02-23: qty 1

## 2012-02-23 MED ORDER — GABAPENTIN 300 MG PO CAPS
300.0000 mg | ORAL_CAPSULE | Freq: Three times a day (TID) | ORAL | Status: DC
  Administered 2012-02-25 – 2012-03-01 (×15): 300 mg via ORAL
  Filled 2012-02-23 (×19): qty 1

## 2012-02-23 NOTE — Progress Notes (Signed)
Patient ID: Trevor Thompson, male   DOB: 06/26/1961, 50 y.o.   MRN: [id number] No clinical change in the patient's left foot. He does have dry gangrene over the distal portion of his left second toe. The area of blistering over the lateral aspect of his fifth near metatarsal head is stable. There is no evidence of inflammation. I did review the patient's arteriogram from yesterday showing patent tibial vessels down to the level of the ankle with a distal disease in the foot. I feel it is best chance for healing would be primary toe amputation on the left. I did explain that potential for nonhealing of this. Also explained the possible need for more proximal level of amputation if he could have a nonhealing. I scheduled this for Friday, August 23 

## 2012-02-23 NOTE — Progress Notes (Signed)
Subjective: Trevor Thompson conversation with the patient regarding the need for amputation of his toe. The patient was very receptive to this and had an expectation that his toe could be salvaged with revascularization. I discussed with him the findings of Dr. Arbie Cookey on the arteriogram and that there was no role for revascularization given the findings.  The patient states that this current regimen of pain medication is not really helping his pain and he describes pain as pins and needles in the bilateral lower extremities  Objective: Filed Vitals:   02/22/12 2038 02/23/12 0540 02/23/12 0850 02/23/12 1509  BP: 154/92 124/73 130/83 123/85  Pulse: 93 81 89 93  Temp: 99.6 F (37.6 C) 97.3 F (36.3 C) 99.5 F (37.5 C) 99 F (37.2 C)  TempSrc: Oral Oral Oral Oral  Resp: 20 20 20 20   Height: 5\' 11"  (1.803 m)     Weight: 113.399 kg (250 lb)     SpO2: 93% 95% 97% 98%   Weight change: -0 kg (-0 oz)  Intake/Output Summary (Last 24 hours) at 02/23/12 1818 Last data filed at 02/23/12 0855  Gross per 24 hour  Intake    720 ml  Output    875 ml  Net   -155 ml    General: Alert, awake, oriented x3, in no acute distress.  HEENT: [place]/AT PEERL, EOMI Neck: Trachea midline,  no masses, no thyromegal,y no JVD, no carotid bruit Heart: Regular rate and rhythm, without murmurs, rubs, gallops, PMI non-displaced, no heaves or thrills on palpation.  Lungs: Clear to auscultation. Neuro: No focal neurological deficits noted. Musculoskeletal: No warm swelling or erythema around joints. Patient has a clearly gangrenous left second toe. There is no inflammation present although the patient has a mild swelling of the entire left foot. Psychiatric: Patient alert and oriented x3, however he does not appear to have good insight into the extent of his acute illness.    Lab Results:  [facility] 02/23/12 0640 02/22/12 0442  NA 136 135  K 4.0 4.1  CL 103 101  CO2 25 26  GLUCOSE 135* 179*  BUN 15 20  CREATININE 1.20  0.98  CALCIUM 9.3 9.4  MG -- --  PHOS -- --    Basename 02/22/12 0442  AST 20  ALT 23  ALKPHOS 79  BILITOT 0.5  PROT 6.5  ALBUMIN 2.4*   No results found for this basename: LIPASE:2,AMYLASE:2 in the last 72 hours  Basename 02/23/12 0640 02/22/12 0442 02/21/12 1014  WBC 14.2* 13.5* --  NEUTROABS -- -- 6.3  HGB 13.5 13.6 --  HCT 42.6 42.0 --  MCV 84.9 84.3 --  PLT 156 166 --   No results found for this basename: CKTOTAL:3,CKMB:3,CKMBINDEX:3,TROPONINI:3 in the last 72 hours No components found with this basename: POCBNP:3 No results found for this basename: DDIMER:2 in the last 72 hours  Basename 02/21/12 1626  HGBA1C 11.2*    Basename 02/22/12 0442  CHOL 131  HDL 64  LDLCALC 41  TRIG 128  CHOLHDL 2.0  LDLDIRECT --    Basename 02/21/12 1626  TSH 0.989  T4TOTAL --  T3FREE --  THYROIDAB --   No results found for this basename: VITAMINB12:2,FOLATE:2,FERRITIN:2,TIBC:2,IRON:2,RETICCTPCT:2 in the last 72 hours  Micro Results: No results found for this or any previous visit (from the past 240 hour(s)).  Studies/Results: Dg Chest Port 1 View  02/21/2012  *RADIOLOGY REPORT*  Clinical Data: Foot pain.  Renal transplant patient.  Admission.  PORTABLE CHEST - 1 VIEW  Comparison: None.  Findings: Streaky opacity is seen in the right perihilar region, which may be due to atelectasis, scarring, or developing infiltrate.  Left lung is clear.  No evidence of pleural effusion. Heart size is within normal limits.  IMPRESSION: Right perihilar opacity, which may be due to atelectasis, scarring, or developing infiltrate.  Radiographic followup is recommended.   Original Report Authenticated By: Danae Orleans, M.D.    Dg Foot Complete Left  02/21/2012  *RADIOLOGY REPORT*  Clinical Data: Pain and necrosis of the second toe.  LEFT FOOT - COMPLETE 3+ VIEW  Comparison: None.  Findings: There is no bony abnormality.  No evidence of fracture, dislocation or osteomyelitis.  There is extensive  vascular calcification throughout the region.  IMPRESSION: Extensive vascular calcification.  No bony finding.   Original Report Authenticated By: Thomasenia Sales, M.D. ( 02/21/2012 11:23:14 )     Medications: I have reviewed the patient's current medications. Scheduled Meds:   . docusate sodium  100 mg Oral BID  . gabapentin  300 mg Oral QHS  . gabapentin  300 mg Oral BID  . gabapentin  300 mg Oral TID  . heparin  5,000 Units Subcutaneous Q8H  . insulin aspart  0-15 Units Subcutaneous TID WC  . insulin aspart  10 Units Subcutaneous TID WC  . insulin glargine  32 Units Subcutaneous BID  . metoprolol tartrate  25 mg Oral BID  . moxifloxacin  400 mg Oral Q2000  . mycophenolate  360 mg Oral TID  . omega-3 acid ethyl esters  2 g Oral BID  . pantoprazole  40 mg Oral Q breakfast  . predniSONE  5 mg Oral Q breakfast  . saccharomyces boulardii  250 mg Oral BID  . sulfamethoxazole-trimethoprim  2 tablet Oral Q12H  . tacrolimus  4 mg Oral BID   Continuous Infusions:   . 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 early. The patient will need amputation of his left ear. I will speak with Dr. Arbie Cookey or involve orthopedics surgery as necessary for amputation of the left toe.     Cellulitis of left foot (02/21/2012)   Assessment: Continue Avelox for soft tissue coverage of the     ? PVD (peripheral vascular disease) (02/21/2012)   Assessment: Patient has findings of diffuse peripheral arterial disease is noted on arteriogram. 50% stenosis of renal artery also noted.     History of renal transplant  (02/21/2012)   Assessment: Patient continues on immunosuppressive medications.    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

## 2012-02-23 NOTE — Progress Notes (Signed)
Covering Clinical Child psychotherapist (CSW) received a referral to speak to pt who requested to speak to a CSW. CSW visited pt room, introduced herself and inquired whether pt had questions for CSW. Pt stated he did not have any questions and that CSW referral was inappropriate. CSW signing off. Please reconsult if pt has any CSW needs.  Theresia Bough, MSW, Theresia Majors [phone]

## 2012-02-24 LAB — GLUCOSE, CAPILLARY: Glucose-Capillary: 82 mg/dL (ref 70–99)

## 2012-02-24 MED ORDER — VANCOMYCIN HCL 1000 MG IV SOLR
1500.0000 mg | INTRAVENOUS | Status: AC
  Administered 2012-02-25: 1500 mg via INTRAVENOUS
  Filled 2012-02-24 (×3): qty 1500

## 2012-02-24 MED ORDER — VANCOMYCIN HCL IN DEXTROSE 1-5 GM/200ML-% IV SOLN: 1000.0000 mg | INTRAVENOUS | Status: DC

## 2012-02-24 NOTE — Progress Notes (Signed)
Subjective: Trevor Thompson more accepting of anticipated amputation of toe scheduled for tomorrow. Trevor Thompson's main concern of that the pain resolve with the amputation. Objective: Filed Vitals:   02/23/12 2048 02/24/12 0405 02/24/12 1000 02/24/12 1436  BP: 148/68 128/82 118/78 133/91  Pulse: 89 87 80 88  Temp: 98.3 F (36.8 C) 99.8 F (37.7 C) 99.9 F (37.7 C) 98.2 F (36.8 C)  TempSrc: Oral Oral Oral Oral  Resp: 20 20 18 19   Height:      Weight: 112.6 kg (248 lb 3.8 oz)     SpO2: 92% 95% 93% 93%   Weight change: -0.799 kg (-1 lb 12.2 oz)  Intake/Output Summary (Last 24 hours) at 02/24/12 2038 Last data filed at 02/24/12 1706  Gross per 24 hour  Intake    750 ml  Output    600 ml  Net    150 ml    General: Alert, awake, oriented x3, in no acute distress.  HEENT: [place]/AT PEERL, EOMI Heart: Regular rate and rhythm, without murmurs, rubs, gallops Lungs: Clear to auscultation. No wheezing otr rales Neuro: No focal neurological deficits noted. Musculoskeletal: No warm swelling or erythema around joints. Patient has a clearly gangrenous left second toe. There is no inflammation present although the patient has a mild swelling of the entire left foot.    Lab Results:  [facility] 02/23/12 0640 02/22/12 0442  NA 136 135  K 4.0 4.1  CL 103 101  CO2 25 26  GLUCOSE 135* 179*  BUN 15 20  CREATININE 1.20 0.98  CALCIUM 9.3 9.4  MG -- --  PHOS -- --    Basename 02/22/12 0442  AST 20  ALT 23  ALKPHOS 79  BILITOT 0.5  PROT 6.5  ALBUMIN 2.4*   No results found for this basename: LIPASE:2,AMYLASE:2 in the last 72 hours  Basename 02/23/12 0640 02/22/12 0442  WBC 14.2* 13.5*  NEUTROABS -- --  HGB 13.5 13.6  HCT 42.6 42.0  MCV 84.9 84.3  PLT 156 166   No results found for this basename: CKTOTAL:3,CKMB:3,CKMBINDEX:3,TROPONINI:3 in the last 72 hours No components found with this basename: POCBNP:3 No results found for this basename: DDIMER:2 in the last 72 hours No results found for this  basename: HGBA1C:2 in the last 72 hours  Basename 02/22/12 0442  CHOL 131  HDL 64  LDLCALC 41  TRIG 128  CHOLHDL 2.0  LDLDIRECT --   No results found for this basename: TSH,T4TOTAL,FREET3,T3FREE,THYROIDAB in the last 72 hours No results found for this basename: VITAMINB12:2,FOLATE:2,FERRITIN:2,TIBC:2,IRON:2,RETICCTPCT:2 in the last 72 hours  Micro Results: No results found for this or any previous visit (from the past 240 hour(s)).  Studies/Results: Dg Chest Port 1 View  02/21/2012  *RADIOLOGY REPORT*  Clinical Data: Foot pain.  Renal transplant patient.  Admission.  PORTABLE CHEST - 1 VIEW  Comparison: None.  Findings: Streaky opacity is seen in the right perihilar region, which may be due to atelectasis, scarring, or developing infiltrate.  Left lung is clear.  No evidence of pleural effusion. Heart size is within normal limits.  IMPRESSION: Right perihilar opacity, which may be due to atelectasis, scarring, or developing infiltrate.  Radiographic followup is recommended.   Original Report Authenticated By: Danae Orleans, M.D.    Dg Foot Complete Left  02/21/2012  *RADIOLOGY REPORT*  Clinical Data: Pain and necrosis of the second toe.  LEFT FOOT - COMPLETE 3+ VIEW  Comparison: None.  Findings: There is no bony abnormality.  No evidence of fracture, dislocation  or osteomyelitis.  There is extensive vascular calcification throughout the region.  IMPRESSION: Extensive vascular calcification.  No bony finding.   Original Report Authenticated By: Thomasenia Sales, M.D. ( 02/21/2012 11:23:14 )     Medications: I have reviewed the patient's current medications. Scheduled Meds:    . docusate sodium  100 mg Oral BID  . gabapentin  300 mg Oral QHS  . gabapentin  300 mg Oral BID  . gabapentin  300 mg Oral TID  . heparin  5,000 Units Subcutaneous Q8H  . insulin aspart  0-15 Units Subcutaneous TID WC  . insulin aspart  10 Units Subcutaneous TID WC  . insulin glargine  32 Units Subcutaneous BID    . metoprolol tartrate  25 mg Oral BID  . moxifloxacin  400 mg Oral Q2000  . mycophenolate  360 mg Oral TID  . omega-3 acid ethyl esters  2 g Oral BID  . pantoprazole  40 mg Oral Q breakfast  . predniSONE  5 mg Oral Q breakfast  . saccharomyces boulardii  250 mg Oral BID  . sulfamethoxazole-trimethoprim  2 tablet Oral Q12H  . tacrolimus  4 mg Oral BID  . vancomycin  1,500 mg Intravenous To OR  . DISCONTD: vancomycin  1,000 mg Intravenous To OR   Continuous Infusions:    . sodium chloride 75 mL/hr at 02/24/12 1112   PRN Meds:.acetaminophen, acetaminophen, alum & mag hydroxide-simeth, cloNIDine, guaiFENesin-dextromethorphan, HYDROcodone-acetaminophen, metoprolol, morphine injection, ondansetron (ZOFRAN) IV, ondansetron, oxyCODONE, phenol Assessment/Plan: Patient Active Hospital Problem List:  Gangrene associated with diabetes (02/21/2012)   Assessment:Scheduled for amputation of toe tomorrow.    Cellulitis of left foot (02/21/2012)   Assessment: Continue Avelox for soft tissue coverage of the     PVD (peripheral vascular disease) (02/21/2012)   Assessment: Patient has findings of diffuse peripheral arterial disease is noted on arteriogram. 50% stenosis of renal artery also noted.     History of renal transplant (02/21/2012)   Assessment: Patient continues on immunosuppressive medications.    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 Continue current medications  Dehydration as evidenced by hemoconcentration (02/21/2012)   Assessment: Resolved    Left foot pain (02/21/2012)   Assessment: Patient and I did realize the benefits of Neurontin.  We'll continue to escalate the dose as ordered.       LOS: 3 days

## 2012-02-25 ENCOUNTER — Encounter (HOSPITAL_COMMUNITY)
Admission: EM | Disposition: A | Discharge status: Home / Self Care | Payer: Self-pay | Source: Home / Self Care | Attending: Internal Medicine

## 2012-02-25 ENCOUNTER — Encounter (HOSPITAL_COMMUNITY): Payer: Self-pay | Admitting: Anesthesiology

## 2012-02-25 ENCOUNTER — Inpatient Hospital Stay (HOSPITAL_COMMUNITY): Payer: Medicare Other | Admitting: Anesthesiology

## 2012-02-25 HISTORY — PX: AMPUTATION: SHX166

## 2012-02-25 LAB — PROTIME-INR
INR: 1.26 (ref 0.00–1.49)
Prothrombin Time: 16.1 seconds — ABNORMAL HIGH (ref 11.6–15.2)

## 2012-02-25 LAB — CBC
HCT: 38.6 % — ABNORMAL LOW (ref 39.0–52.0)
Hemoglobin: 12.6 g/dL — ABNORMAL LOW (ref 13.0–17.0)
RDW: 14.2 % (ref 11.5–15.5)
WBC: 17.5 10*3/uL — ABNORMAL HIGH (ref 4.0–10.5)

## 2012-02-25 LAB — GLUCOSE, CAPILLARY
Glucose-Capillary: 181 mg/dL — ABNORMAL HIGH (ref 70–99)
Glucose-Capillary: 160 mg/dL — ABNORMAL HIGH (ref 70–99)
Glucose-Capillary: 160 mg/dL — ABNORMAL HIGH (ref 70–99)

## 2012-02-25 SURGERY — AMPUTATION DIGIT
Anesthesia: General | Site: Toe | Laterality: Left | Wound class: Dirty or Infected

## 2012-02-25 MED ORDER — MIDAZOLAM HCL 5 MG/5ML IJ SOLN
INTRAMUSCULAR | Status: DC | PRN
  Administered 2012-02-25: 2 mg via INTRAVENOUS

## 2012-02-25 MED ORDER — SODIUM CHLORIDE 0.9 % IR SOLN
Status: DC | PRN
  Administered 2012-02-25: 1000 mL

## 2012-02-25 MED ORDER — BUPIVACAINE HCL (PF) 0.5 % IJ SOLN
INTRAMUSCULAR | Status: DC | PRN
  Administered 2012-02-25: 20 mL

## 2012-02-25 MED ORDER — LACTATED RINGERS IV SOLN
INTRAVENOUS | Status: DC | PRN
  Administered 2012-02-25: 11:00:00 via INTRAVENOUS

## 2012-02-25 MED ORDER — FENTANYL CITRATE 0.05 MG/ML IJ SOLN
INTRAMUSCULAR | Status: AC
  Filled 2012-02-25: qty 2

## 2012-02-25 MED ORDER — HYDROMORPHONE HCL PF 1 MG/ML IJ SOLN: 0.2500 mg | INTRAMUSCULAR | Status: DC | PRN

## 2012-02-25 MED ORDER — PROPOFOL 10 MG/ML IV EMUL
INTRAVENOUS | Status: DC | PRN
  Administered 2012-02-25: 30 mg via INTRAVENOUS

## 2012-02-25 MED ORDER — LIDOCAINE HCL (CARDIAC) 20 MG/ML IV SOLN
INTRAVENOUS | Status: DC | PRN
  Administered 2012-02-25: 40 mg via INTRAVENOUS

## 2012-02-25 MED ORDER — LIDOCAINE-EPINEPHRINE 0.5 %-1:200000 IJ SOLN
INTRAMUSCULAR | Status: AC
  Filled 2012-02-25: qty 1

## 2012-02-25 MED ORDER — FENTANYL CITRATE 0.05 MG/ML IJ SOLN
INTRAMUSCULAR | Status: DC | PRN
  Administered 2012-02-25: 100 ug via INTRAVENOUS

## 2012-02-25 MED ORDER — PROPOFOL 10 MG/ML IV EMUL
INTRAVENOUS | Status: DC | PRN
  Administered 2012-02-25: 50 ug/kg/min via INTRAVENOUS

## 2012-02-25 MED ORDER — ONDANSETRON HCL 4 MG/2ML IJ SOLN: 4.0000 mg | Freq: Once | INTRAMUSCULAR | Status: AC | PRN

## 2012-02-25 SURGICAL SUPPLY — 39 items
BANDAGE CONFORM 2  STR LF (GAUZE/BANDAGES/DRESSINGS) ×2 IMPLANT
BANDAGE GAUZE ELAST BULKY 4 IN (GAUZE/BANDAGES/DRESSINGS) IMPLANT
CANISTER SUCTION 2500CC (MISCELLANEOUS) ×2 IMPLANT
CLIP LIGATING EXTRA MED SLVR (CLIP) ×2 IMPLANT
CLIP LIGATING EXTRA SM BLUE (MISCELLANEOUS) ×2 IMPLANT
CLOTH BEACON ORANGE TIMEOUT ST (SAFETY) ×2 IMPLANT
COVER SURGICAL LIGHT HANDLE (MISCELLANEOUS) ×2 IMPLANT
DRAPE EXTREMITY T 121X128X90 (DRAPE) ×2 IMPLANT
ELECT REM PT RETURN 9FT ADLT (ELECTROSURGICAL) ×2
ELECTRODE REM PT RTRN 9FT ADLT (ELECTROSURGICAL) ×1 IMPLANT
GAUZE SPONGE 2X2 8PLY STRL LF (GAUZE/BANDAGES/DRESSINGS) IMPLANT
GLOVE BIOGEL PI IND STRL 6.5 (GLOVE) ×1 IMPLANT
GLOVE BIOGEL PI IND STRL 7.5 (GLOVE) ×1 IMPLANT
GLOVE BIOGEL PI INDICATOR 6.5 (GLOVE) ×1
GLOVE BIOGEL PI INDICATOR 7.5 (GLOVE) ×1
GLOVE SS BIOGEL STRL SZ 7.5 (GLOVE) ×1 IMPLANT
GLOVE SUPERSENSE BIOGEL SZ 7.5 (GLOVE) ×1
GLOVE SURG SS PI 7.5 STRL IVOR (GLOVE) ×2 IMPLANT
GOWN STRL NON-REIN LRG LVL3 (GOWN DISPOSABLE) ×6 IMPLANT
KIT BASIN OR (CUSTOM PROCEDURE TRAY) ×2 IMPLANT
KIT ROOM TURNOVER OR (KITS) ×2 IMPLANT
NEEDLE 22X1 1/2 (OR ONLY) (NEEDLE) IMPLANT
NS IRRIG 1000ML POUR BTL (IV SOLUTION) ×2 IMPLANT
PACK GENERAL/GYN (CUSTOM PROCEDURE TRAY) ×2 IMPLANT
PAD ARMBOARD 7.5X6 YLW CONV (MISCELLANEOUS) ×4 IMPLANT
SPECIMEN JAR SMALL (MISCELLANEOUS) ×2 IMPLANT
SPONGE GAUZE 2X2 STER 10/PKG (GAUZE/BANDAGES/DRESSINGS)
SPONGE GAUZE 4X4 12PLY (GAUZE/BANDAGES/DRESSINGS) ×2 IMPLANT
SUT ETHILON 3 0 PS 1 (SUTURE) ×2 IMPLANT
SUT VIC AB 3-0 SH 27 (SUTURE)
SUT VIC AB 3-0 SH 27X BRD (SUTURE) IMPLANT
SWAB COLLECTION DEVICE MRSA (MISCELLANEOUS) ×2 IMPLANT
SWAB CULTURE LIQUID MINI MALE (MISCELLANEOUS) IMPLANT
SYR CONTROL 10ML LL (SYRINGE) IMPLANT
TOWEL OR 17X24 6PK STRL BLUE (TOWEL DISPOSABLE) ×2 IMPLANT
TOWEL OR 17X26 10 PK STRL BLUE (TOWEL DISPOSABLE) ×2 IMPLANT
TUBE ANAEROBIC SPECIMEN COL (MISCELLANEOUS) ×2 IMPLANT
UNDERPAD 30X30 INCONTINENT (UNDERPADS AND DIAPERS) ×2 IMPLANT
WATER STERILE IRR 1000ML POUR (IV SOLUTION) ×2 IMPLANT

## 2012-02-25 NOTE — Addendum Note (Signed)
Addendum  created 02/25/12 1219 by Rivka Barbara, MD   Modules edited:Notes Section, Orders, PRL Based Order Sets

## 2012-02-25 NOTE — Anesthesia Postprocedure Evaluation (Signed)
  Anesthesia Post-op Note  Patient: Trevor Thompson  Procedure(s) Performed: Procedure(s) (LRB): AMPUTATION DIGIT (Left)  Patient Location: PACU  Anesthesia Type: MAC combined with regional for post-op pain  Level of Consciousness: awake, alert , oriented and patient cooperative  Airway and Oxygen Therapy: Patient Spontanous Breathing and Patient connected to nasal cannula oxygen  Post-op Pain: none  Post-op Assessment: Post-op Vital signs reviewed, Patient's Cardiovascular Status Stable, Respiratory Function Stable, Patent Airway, No signs of Nausea or vomiting and Pain level controlled  Post-op Vital Signs: stable  Complications: No apparent anesthesia complications

## 2012-02-25 NOTE — Progress Notes (Signed)
Pt's temperature_100.3, 2 tylenol were given to pt.vascular MD on call was notified,not further orders received.keep monitoring pt. Closely.

## 2012-02-25 NOTE — H&P (View-Only) (Signed)
Patient ID: Trevor Thompson, male   DOB: 1960-09-01, 51 y.o.   MRN: [id number] No clinical change in the patient's left foot. He does have dry gangrene over the distal portion of his left second toe. The area of blistering over the lateral aspect of his fifth near metatarsal head is stable. There is no evidence of inflammation. I did review the patient's arteriogram from yesterday showing patent tibial vessels down to the level of the ankle with a distal disease in the foot. I feel it is best chance for healing would be primary toe amputation on the left. I did explain that potential for nonhealing of this. Also explained the possible need for more proximal level of amputation if he could have a nonhealing. I scheduled this for Friday, August 23

## 2012-02-25 NOTE — Progress Notes (Signed)
Subjective: Pt is status post amputation of toe this morning. He states that so far has pain has been absent post surgery. Patient is concerned about not having received his immunosuppressive medications today.   Filed Vitals:   02/25/12 1230 02/25/12 1231 02/25/12 1259 02/25/12 1721  BP:   136/80 158/95  Pulse: 92 91 94 99  Temp: 99.3 F (37.4 C)  98 F (36.7 C) 100.3 F (37.9 C)  TempSrc:   Oral Oral  Resp: 19 21 18 18   Height:      Weight:      SpO2: 95% 92% 95% 93%   Weight change: 0.6 kg (1 lb 5.2 oz)  Intake/Output Summary (Last 24 hours) at 02/25/12 1854 Last data filed at 02/25/12 1400  Gross per 24 hour  Intake 1467.5 ml  Output     10 ml  Net 1457.5 ml    General: Alert, awake, oriented x3, in no acute distress.  HEENT: [place]/AT PEERL, EOMI Heart: Regular rate and rhythm, without murmurs, rubs, gallops Lungs: Clear to auscultation. No wheezing otr rales Neuro: No focal neurological deficits noted. Musculoskeletal: No warm swelling or erythema around joints.Left foot has postsurgical dressing applied..    Lab Results:  Basename 02/23/12 0640  NA 136  K 4.0  CL 103  CO2 25  GLUCOSE 135*  BUN 15  CREATININE 1.20  CALCIUM 9.3  MG --  PHOS --   No results found for this basename: AST:2,ALT:2,ALKPHOS:2,BILITOT:2,PROT:2,ALBUMIN:2 in the last 72 hours No results found for this basename: LIPASE:2,AMYLASE:2 in the last 72 hours  Basename 02/25/12 0625 02/23/12 0640  WBC 17.5* 14.2*  NEUTROABS -- --  HGB 12.6* 13.5  HCT 38.6* 42.6  MCV 83.5 84.9  PLT 154 156   No results found for this basename: CKTOTAL:3,CKMB:3,CKMBINDEX:3,TROPONINI:3 in the last 72 hours No components found with this basename: POCBNP:3 No results found for this basename: DDIMER:2 in the last 72 hours No results found for this basename: HGBA1C:2 in the last 72 hours No results found for this basename: CHOL:2,HDL:2,LDLCALC:2,TRIG:2,CHOLHDL:2,LDLDIRECT:2 in the last 72 hours No results  found for this basename: TSH,T4TOTAL,FREET3,T3FREE,THYROIDAB in the last 72 hours No results found for this basename: VITAMINB12:2,FOLATE:2,FERRITIN:2,TIBC:2,IRON:2,RETICCTPCT:2 in the last 72 hours  Micro Results: No results found for this or any previous visit (from the past 240 hour(s)).  Studies/Results: Dg Chest Port 1 View  02/21/2012  *RADIOLOGY REPORT*  Clinical Data: Foot pain.  Renal transplant patient.  Admission.  PORTABLE CHEST - 1 VIEW  Comparison: None.  Findings: Streaky opacity is seen in the right perihilar region, which may be due to atelectasis, scarring, or developing infiltrate.  Left lung is clear.  No evidence of pleural effusion. Heart size is within normal limits.  IMPRESSION: Right perihilar opacity, which may be due to atelectasis, scarring, or developing infiltrate.  Radiographic followup is recommended.   Original Report Authenticated By: Danae Orleans, M.D.    Dg Foot Complete Left  02/21/2012  *RADIOLOGY REPORT*  Clinical Data: Pain and necrosis of the second toe.  LEFT FOOT - COMPLETE 3+ VIEW  Comparison: None.  Findings: There is no bony abnormality.  No evidence of fracture, dislocation or osteomyelitis.  There is extensive vascular calcification throughout the region.  IMPRESSION: Extensive vascular calcification.  No bony finding.   Original Report Authenticated By: Thomasenia Sales, M.D. ( 02/21/2012 11:23:14 )     Medications: I have reviewed the patient's current medications. Scheduled Meds:    . docusate sodium  100 mg Oral  BID  . gabapentin  300 mg Oral TID  . heparin  5,000 Units Subcutaneous Q8H  . insulin aspart  0-15 Units Subcutaneous TID WC  . insulin aspart  10 Units Subcutaneous TID WC  . insulin glargine  32 Units Subcutaneous BID  . metoprolol tartrate  25 mg Oral BID  . moxifloxacin  400 mg Oral Q2000  . mycophenolate  360 mg Oral TID  . omega-3 acid ethyl esters  2 g Oral BID  . pantoprazole  40 mg Oral Q breakfast  . predniSONE  5 mg  Oral Q breakfast  . saccharomyces boulardii  250 mg Oral BID  . sulfamethoxazole-trimethoprim  2 tablet Oral Q12H  . tacrolimus  4 mg Oral BID  . vancomycin  1,500 mg Intravenous To OR   Continuous Infusions:    . sodium chloride 75 mL/hr (02/25/12 0127)   PRN Meds:.acetaminophen, acetaminophen, alum & mag hydroxide-simeth, cloNIDine, guaiFENesin-dextromethorphan, HYDROcodone-acetaminophen, HYDROmorphone (DILAUDID) injection, metoprolol, morphine injection, ondansetron (ZOFRAN) IV, ondansetron (ZOFRAN) IV, ondansetron, oxyCODONE, phenol, DISCONTD: sodium chloride irrigation Assessment/Plan: Patient Active Hospital Problem List:  Gangrene associated with diabetes (02/21/2012)   Assessment: Status post amputation of left toe. Doing well    Cellulitis of left foot (02/21/2012)   Assessment: Continue Avelox for soft tissue coverage of the left foot     PVD (peripheral vascular disease) (02/21/2012)   Assessment: Patient has findings of diffuse peripheral arterial disease is noted on arteriogram. 50% stenosis of renal artery also noted.     History of renal transplant (02/21/2012)   Assessment: Patient continues on immunosuppressive medications.    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 Continue current medications  Dehydration as evidenced by hemoconcentration (02/21/2012)   Assessment: Resolved    Left foot pain (02/21/2012)   Assessment: The patient's pain he describes his neuropathic pain. We'll continue Neurontin       LOS: 4 days

## 2012-02-25 NOTE — Progress Notes (Signed)
Orthopedic Tech Progress Note Patient Details:  Trevor Thompson 1960-10-16 [id number]  Ortho Devices Type of Ortho Device: Other (comment) (darco shoe) Ortho Device/Splint Location: left foot Ortho Device/Splint Interventions: Freeman Caldron, Sabine Tenenbaum 02/25/2012, 1:42 PM

## 2012-02-25 NOTE — Transfer of Care (Signed)
Immediate Anesthesia Transfer of Care Note  Patient: Trevor Thompson  Procedure(s) Performed: Procedure(s) (LRB): AMPUTATION DIGIT (Left)  Patient Location: PACU  Anesthesia Type: MAC  Level of Consciousness: awake, alert  and oriented  Airway & Oxygen Therapy: Patient Spontanous Breathing  Post-op Assessment: Report given to PACU RN  Post vital signs: Reviewed and stable  Complications: No apparent anesthesia complications

## 2012-02-25 NOTE — Anesthesia Postprocedure Evaluation (Signed)
  Anesthesia Post-op Note  Patient: Trevor Thompson  Procedure(s) Performed: Procedure(s) (LRB): AMPUTATION DIGIT (Left)  Patient Location: PACU  Anesthesia Type: MAC and Regional  Level of Consciousness: awake, alert , oriented and patient cooperative  Airway and Oxygen Therapy: Patient Spontanous Breathing  Post-op Pain: none  Post-op Assessment: Post-op Vital signs reviewed, Patient's Cardiovascular Status Stable, Respiratory Function Stable, Patent Airway, No signs of Nausea or vomiting and Pain level controlled  Post-op Vital Signs: stable  Complications: No apparent anesthesia complications

## 2012-02-25 NOTE — Anesthesia Preprocedure Evaluation (Addendum)
Anesthesia Evaluation  Patient identified by MRN, date of birth, ID band Patient awake    Reviewed: Allergy & Precautions  Airway Mallampati: I TM Distance: >3 FB Neck ROM: Full    Dental  (+) Teeth Intact and Dental Advisory Given   Pulmonary  breath sounds clear to auscultation        Cardiovascular hypertension, Pt. on home beta blockers Rhythm:Regular Rate:Normal     Neuro/Psych  Neuromuscular disease    GI/Hepatic GERD-  Medicated and Controlled,  Endo/Other  Type 1, Insulin Dependent  Renal/GU Renal disease     Musculoskeletal   Abdominal   Peds  Hematology   Anesthesia Other Findings   Reproductive/Obstetrics                           Anesthesia Physical Anesthesia Plan  ASA: III  Anesthesia Plan: Regional and MAC   Post-op Pain Management: MAC Combined w/ Regional for Post-op pain   Induction: Intravenous  Airway Management Planned: Mask  Additional Equipment:   Intra-op Plan:   Post-operative Plan:   Informed Consent: I have reviewed the patients History and Physical, chart, labs and discussed the procedure including the risks, benefits and alternatives for the proposed anesthesia with the patient or authorized representative who has indicated his/her understanding and acceptance.     Plan Discussed with: CRNA, Anesthesiologist and Surgeon  Anesthesia Plan Comments:         Anesthesia Quick Evaluation

## 2012-02-25 NOTE — Anesthesia Procedure Notes (Addendum)
Anesthesia Regional Block:  Ankle block  Pre-Anesthetic Checklist: ,, timeout performed, Correct Patient, Correct Site, Correct Laterality, Correct Procedure, Correct Position, site marked, Risks and benefits discussed,  Surgical consent,  Pre-op evaluation,  At surgeon's request and post-op pain management  Laterality: Left  Prep: Maximum Sterile Barrier Precautions used, chloraprep and alcohol swabs       Needles:  Injection technique: Single-shot      Needle Gauge: 25 and 25 G  Needle insertion depth: 4 cm   Additional Needles: Ankle block Narrative:  Start time: 02/25/2012 10:48 AM End time: 02/25/2012 10:55 AM Injection made incrementally with aspirations every 5 mL.  Performed by: Personally  Anesthesiologist: Maren Beach MD  Additional Notes: Pt accepts procedure and risks. 40cc (20cc 2% Lidocaine and 20cc 0.5% Marcaine )  Ant / Post Tibial and Sural N w/o difficulty or discomfort. GES   Procedure Name: MAC Date/Time: 02/25/2012 11:09 AM Performed by: De Nurse Pre-anesthesia Checklist: Patient identified, Timeout performed, Emergency Drugs available, Suction available and Patient being monitored Patient Re-evaluated:Patient Re-evaluated prior to inductionOxygen Delivery Method: Simple face mask Preoxygenation: Pre-oxygenation with 100% oxygen Intubation Type: IV induction Placement Confirmation: positive ETCO2

## 2012-02-25 NOTE — Preoperative (Addendum)
Beta Blockers   Reason not to administer Beta Blockers:Not Applicable, last dose 02/25/12 at 10:57

## 2012-02-25 NOTE — Progress Notes (Signed)
Orthopedic Tech Progress Note Patient Details:  Trevor Thompson 08-30-1960 [id number]  Patient ID: Trevor Thompson, male   DOB: [birth year], 51 y.o.   MRN: [id number] Darco shoe was left in room  Nikki Dom 02/25/2012, 1:43 PM

## 2012-02-25 NOTE — Interval H&P Note (Signed)
History and Physical Interval Note:  02/25/2012 9:11 AM  Trevor Thompson  has presented today for surgery, with the diagnosis of Dry Gangrene left 2nd toe  The various methods of treatment have been discussed with the patient and family. After consideration of risks, benefits and other options for treatment, the patient has consented to  Procedure(s) (LRB): AMPUTATION DIGIT (Left) as a surgical intervention .  The patient's history has been reviewed, patient examined, no change in status, stable for surgery.  I have reviewed the patient's chart and labs.  Questions were answered to the patient's satisfaction.     Issam Carlyon

## 2012-02-25 NOTE — Op Note (Signed)
OPERATIVE REPORT  DATE OF SURGERY: 02/25/2012  PATIENT: Trevor Thompson, 51 y.o. male MRN: [id number]  DOB: 1961/06/17  PRE-OPERATIVE DIAGNOSIS: Dry gangrene left second toe and blister lateral 2/5 metatarsal head  POST-OPERATIVE DIAGNOSIS:  Same  PROCEDURE: Transmetatarsal amputation left second toe and debridement of blister over the lateral fifth metatarsal head  SURGEON:  Gretta Began, M.D.  PHYSICIAN ASSISTANT: None  ANESTHESIA:  Ankle block  EBL: Minimal ml  Total I/O In: 0  Out: 10 [Blood:10]  BLOOD ADMINISTERED: None  DRAINS: None  SPECIMEN: Toe amputation and culture of fluid from fifth metatarsal head blister  COUNTS CORRECT:  YES  PLAN OF CARE: PACU   PATIENT DISPOSITION:  PACU - hemodynamically stable  PROCEDURE DETAILS: Patient is a 51 year old gentleman who was consult: On August 19 for dry gangrene of the second toe on the left. He did have a small posterior lateral aspect of his fifth metatarsal head. He underwent diagnostic arteriogram on 02/22/2012. This showed patency of his aorta iliac segments and also of his superficial femoral artery popliteal artery and tibial vessels. He did have a small vessel disease into his foot. Explain the concern regarding his gangrenous changes to his foot with an unreconstructable possibility with in-line flow to the ankle and small vessel disease in the foot. Explained he is at risk for amputation secondary to this and I explained that the best option was for second toe amputation and also debridement of the area of the lateral aspect of his fifth metatarsal head. He understands the potential of nonhealing in his ready for surgery.  The left foot was prepped and draped in usual sterile fashion. A fishmouth incision was made at the base of his second toe. This was carried down to the digital none. There was no evidence of pus at this area there was very poor bleeding. A key elevator was used to mucoperiosteum further this was  resected. This was at the level of the metatarsal head and therefore decision is made to resect this. The articular sig surface of the head was debrided with the rongeur. Bleeding was controlled electrocautery and the wound was irrigated and closed with 3-0 nylon mattress sutures  Attention was then turned to the left fifth metatarsal blister. This was debrided there was cloudy fluid under this and this was sent for culture and sensitivity. This did not appear to go into the articular surface. This did track of under a thick callus that was under the fifth metatarsal head in this area was debridement as well. Sterile dressing was applied to both these wounds and the patient was transferred to the recovery room in stable condition   Gretta Began, M.D. 02/25/2012 12:10 PM

## 2012-02-26 DIAGNOSIS — D72829 Elevated white blood cell count, unspecified: Secondary | ICD-10-CM | POA: Diagnosis not present

## 2012-02-26 LAB — GLUCOSE, CAPILLARY
Glucose-Capillary: 129 mg/dL — ABNORMAL HIGH (ref 70–99)
Glucose-Capillary: 129 mg/dL — ABNORMAL HIGH (ref 70–99)
Glucose-Capillary: 102 mg/dL — ABNORMAL HIGH (ref 70–99)

## 2012-02-26 LAB — CBC WITH DIFFERENTIAL/PLATELET
Eosinophils Relative: 2 % (ref 0–5)
Lymphocytes Relative: 11 % — ABNORMAL LOW (ref 12–46)
MCH: 27.4 pg (ref 26.0–34.0)
Monocytes Absolute: 2.5 10*3/uL — ABNORMAL HIGH (ref 0.1–1.0)
Neutrophils Relative %: 72 % (ref 43–77)
Platelets: 162 10*3/uL (ref 150–400)
RBC: 4.92 MIL/uL (ref 4.22–5.81)
WBC: 16.4 10*3/uL — ABNORMAL HIGH (ref 4.0–10.5)

## 2012-02-26 LAB — BASIC METABOLIC PANEL
GFR calc Af Amer: 71 mL/min — ABNORMAL LOW (ref >90)
GFR calc non Af Amer: 61 mL/min — ABNORMAL LOW (ref >90)
Glucose, Bld: 110 mg/dL — ABNORMAL HIGH (ref 70–99)
Potassium: 4.2 mEq/L (ref 3.5–5.1)
Sodium: 129 mEq/L — ABNORMAL LOW (ref 135–145)

## 2012-02-26 MED ORDER — ZOLPIDEM TARTRATE 5 MG PO TABS
5.0000 mg | ORAL_TABLET | Freq: Every evening | ORAL | Status: DC | PRN
Start: 2012-02-26 — End: 2012-03-01

## 2012-02-26 NOTE — Progress Notes (Signed)
[facility] PROGRESS NOTE  Trevor Thompson RJJ:[id number] DOB: [birth year] DOA: 02/21/2012 PCP: Sheila Oats, MD  Assessment/Plan: Principal Problem:  *Necrotic toe Active Problems:  Cellulitis of left foot   ? 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 Gangrene associated with diabetes: The patient was noted to have diffuse peripheral vascular disease in a setting of gangrenous left second toe. He was evaluated by Dr. early obtain a vascular surgery. An arteriogram was performed and the patient subsequently underwent a transmetatarsal amputation of the left second toe and debridement of a blister over the left fifth metatarsal head. The patient has been given a Darco shoe with ambulation as tolerated. The patient is noted that he's having some difficulty with ambulating physical therapy has been ordered to see the patient to assist with assistive devices. 2. Diabetes type 1 uncontrolled: The patient has a hemoglobin A1c of 11.2 which indicates poor chronic control. However him Hoss with which his blood sugars well-controlled. The patient does not have a primary care physician in the area but will be given referrals for a hemorrhage or physicians at the time of discharge. He will need to further titrate his medications on a long-term basis. 3. Status post cadaver renal transplant: The patient is status post renal transplant and has good renal function. He is on immunosuppressive medications which have been continued  throughout his hospital course. I've given him a reference for [facility] and he will call them to set up an appointment and transferred his care here to the greater area 4. Hypertension: Blood pressure is well-controlled and his hospitalization. 5. Leukocytosis: The patient is developed leukocytosis. We'll check a urinalysis with reflex bronchoscopy to evaluate for urinary tract infection. However this certainly could be related to his chronic prednisone use. All cultures have been negative so far those I am discontinuing Bactrim and Avelox. Vancomycin was discontinued on yesterday  Code Status: Full code Family Communication: Not applicable Disposition Plan: Anticipate discharge to home in medically stable  Trevor Thompson A.  [facility] Pager [phone]. If 8PM-8AM, please contact night-coverage at [email or web address], password [facility] 02/26/2012, 11:20 AM  LOS: 5 days   Brief narrative: Patient is an 51 y.o. male with with a 25 year history of poorly controlled type 1 diabetes mellitus with complications of neuropathy, retinopathy and nephropathy. The patient is status post renal transplant. The patient is currently traveling in the [place] area and reports progressive pain over the past several days of the left foot. The patient reports that he lost the toenail on the left second toenail and over the course of 2 days the toe became progressively necrotic and more painful. The patient became concerned because he noticed a red streak going up the left leg. The patient reports increasing pain 6/10 on the plantar surface of the left foot. The patient also reports painful toes. The patient reports progressive cold intolerance. He has also had some fluctuating blood sugars that have been extreme. The patient reports no history of previous amputation. The patient reports that he was diagnosed with type 1 diabetes mellitus at the age of 65. The patient denies  fever. Hospitalization was  requested for further evaluation and management  Consultants:  Vein and vascular surgery-Dr. Early  Procedures:  Status post transmetatarsal amputation of left second toe  Antibiotics:  Bactrim 8/19 >> 8/24  Avelox 8/19 >> 8/24  Vancomycin 8/19 >> 8/23  HPI/Subjective: Patient states that he did not sleep well last night and does feel a bit tired today. He states that he has no significant pain at the area of the end dictation of the toe.  Objective: Filed Vitals:   02/25/12 1911 02/25/12 2055 02/26/12 0517 02/26/12 0944  BP:  122/87 124/79 128/78  Pulse:  87 83 92  Temp: 99.1 F (37.3 C) 99.8 F (37.7 C) 98.7 F (37.1 C) 99.7 F (37.6 C)  TempSrc:  Oral Oral Oral  Resp:  18 18 18   Height:      Weight:  113.6 kg (250 lb 7.1 oz)    SpO2:  92% 91% 94%   Weight change: 0.4 kg (14.1 oz)  Intake/Output Summary (Last 24 hours) at 02/26/12 1120 Last data filed at 02/25/12 1400  Gross per 24 hour  Intake    500 ml  Output     10 ml  Net    490 ml    General: Alert, awake, oriented x3, in no acute distress.  HEENT: [place]/AT PEERL, EOMI Heart: Regular rate and rhythm Lungs: Clear to auscultation, no wheezing or rhonchi noted. No increased vocal fremitus resonant to percussion  Abdomen: Soft, nontender, nondistended, positive bowel sounds, no masses no hepatosplenomegaly noted..  Neuro: No focal neurological deficits noted Musculoskeletal: No warm swelling or erythema around joints.Left foot has postsurgical dressing applied   Data Reviewed: Basic Metabolic Panel:  Lab 02/23/12 9528 02/22/12 0442 02/21/12 1626 02/21/12 1014  NA 136 135 -- 135  K 4.0 4.1 -- 4.8  CL 103 101 -- 101  CO2 25 26 -- 24  GLUCOSE 135* 179* -- 316*  BUN 15 20 -- 23  CREATININE 1.20 0.98 0.91 1.00  CALCIUM 9.3 9.4 -- 10.6*  MG -- -- -- --  PHOS -- -- -- --   Liver Function Tests:  Lab 02/22/12 0442  AST 20  ALT 23  ALKPHOS 79  BILITOT 0.5  PROT 6.5  ALBUMIN 2.4*   No results  found for this basename: LIPASE:5,AMYLASE:5 in the last 168 hours No results found for this basename: AMMONIA:5 in the last 168 hours CBC:  Lab 02/25/12 0625 02/23/12 0640 02/22/12 0442 02/21/12 1626 02/21/12 1014  WBC 17.5* 14.2* 13.5* 14.5* 9.8  NEUTROABS -- -- -- -- 6.3  HGB 12.6* 13.5 13.6 14.6 16.1  HCT 38.6* 42.6 42.0 44.9 48.9  MCV 83.5 84.9 84.3 84.9 85.0  PLT 154 156 166 177 176   Cardiac Enzymes: No results found for this basename: CKTOTAL:5,CKMB:5,CKMBINDEX:5,TROPONINI:5 in the last 168 hours BNP (last 3 results) No results found for this basename: PROBNP:3 in the last 8760 hours CBG:  Lab 02/26/12 0742 02/26/12 0345 02/25/12 2052 02/25/12 1710 02/25/12 1212  GLUCAP 129* 129* 160* 190* 128*    Recent Results (from the past 240 hour(s))  WOUND CULTURE     Status: Normal (Preliminary result)   Collection Time   02/25/12 11:39 AM      Component Value Range Status Comment   Specimen Description WOUND LEFT FOOT   Final    Special Requests PT ON VANCO   Final    Gram Stain PENDING   Incomplete    Culture NO GROWTH 1 DAY  Final    Report Status PENDING   Incomplete      Studies: Dg Chest Port 1 View  02/21/2012  *RADIOLOGY REPORT*  Clinical Data: Foot pain.  Renal transplant patient.  Admission.  PORTABLE CHEST - 1 VIEW  Comparison: None.  Findings: Streaky opacity is seen in the right perihilar region, which may be due to atelectasis, scarring, or developing infiltrate.  Left lung is clear.  No evidence of pleural effusion. Heart size is within normal limits.  IMPRESSION: Right perihilar opacity, which may be due to atelectasis, scarring, or developing infiltrate.  Radiographic followup is recommended.   Original Report Authenticated By: Danae Orleans, M.D.    Dg Foot Complete Left  02/21/2012  *RADIOLOGY REPORT*  Clinical Data: Pain and necrosis of the second toe.  LEFT FOOT - COMPLETE 3+ VIEW  Comparison: None.  Findings: There is no bony abnormality.  No evidence of  fracture, dislocation or osteomyelitis.  There is extensive vascular calcification throughout the region.  IMPRESSION: Extensive vascular calcification.  No bony finding.   Original Report Authenticated By: Thomasenia Sales, M.D. ( 02/21/2012 11:23:14 )     Scheduled Meds:   . docusate sodium  100 mg Oral BID  . gabapentin  300 mg Oral TID  . heparin  5,000 Units Subcutaneous Q8H  . insulin aspart  0-15 Units Subcutaneous TID WC  . insulin aspart  10 Units Subcutaneous TID WC  . insulin glargine  32 Units Subcutaneous BID  . metoprolol tartrate  25 mg Oral BID  . moxifloxacin  400 mg Oral Q2000  . mycophenolate  360 mg Oral TID  . omega-3 acid ethyl esters  2 g Oral BID  . pantoprazole  40 mg Oral Q breakfast  . predniSONE  5 mg Oral Q breakfast  . saccharomyces boulardii  250 mg Oral BID  . sulfamethoxazole-trimethoprim  2 tablet Oral Q12H  . tacrolimus  4 mg Oral BID  . vancomycin  1,500 mg Intravenous To OR   Continuous Infusions:   . sodium chloride 75 mL/hr (02/26/12 0026)    Principal Problem:  *Necrotic toe Active Problems:  Cellulitis of left foot   ? 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

## 2012-02-26 NOTE — Progress Notes (Addendum)
NCM spoke to pt and states he is new to the area. He does not have a PCP in [place]. Provided [facility] number on d/c instruction. NCM explained he would call that number and provide insurance info and they will assist with finding PCP in this area for him. Isidoro Donning RN CCM Case Mgmt phone [phone]

## 2012-02-26 NOTE — Progress Notes (Signed)
[id number]. Pt refuses ivf (nsxkvo) claims "I don't think I need it. " Saline locked piv.

## 2012-02-26 NOTE — Progress Notes (Signed)
Subjective: Interval History: none.. Mild pain  Objective: Vital signs in last 24 hours: Temp:  [98 F (36.7 C)-100.3 F (37.9 C)] 98.7 F (37.1 C) (08/24 0517) Pulse Rate:  [83-99] 83  (08/24 0517) Resp:  [18-21] 18  (08/24 0517) BP: (109-158)/(66-95) 124/79 mmHg (08/24 0517) SpO2:  [91 %-100 %] 91 % (08/24 0517) FiO2 (%):  [100 %] 100 % (08/23 1220) Weight:  [250 lb 7.1 oz (113.6 kg)] 250 lb 7.1 oz (113.6 kg) (08/23 2055)  Intake/Output from previous day: 08/23 0701 - 08/24 0700 In: 500 [I.V.:500] Out: 10 [Blood:10] Intake/Output this shift:    Dressing intact left foot  Lab Results:  Basename 02/25/12 0625  WBC 17.5*  HGB 12.6*  HCT 38.6*  PLT 154   BMET No results found for this basename: NA:2,K:2,CL:2,CO2:2,GLUCOSE:2,BUN:2,CREATININE:2,CALCIUM:2 in the last 72 hours  Studies/Results: Dg Chest Port 1 View  02/21/2012  *RADIOLOGY REPORT*  Clinical Data: Foot pain.  Renal transplant patient.  Admission.  PORTABLE CHEST - 1 VIEW  Comparison: None.  Findings: Streaky opacity is seen in the right perihilar region, which may be due to atelectasis, scarring, or developing infiltrate.  Left lung is clear.  No evidence of pleural effusion. Heart size is within normal limits.  IMPRESSION: Right perihilar opacity, which may be due to atelectasis, scarring, or developing infiltrate.  Radiographic followup is recommended.   Original Report Authenticated By: Danae Orleans, M.D.    Dg Foot Complete Left  02/21/2012  *RADIOLOGY REPORT*  Clinical Data: Pain and necrosis of the second toe.  LEFT FOOT - COMPLETE 3+ VIEW  Comparison: None.  Findings: There is no bony abnormality.  No evidence of fracture, dislocation or osteomyelitis.  There is extensive vascular calcification throughout the region.  IMPRESSION: Extensive vascular calcification.  No bony finding.   Original Report Authenticated By: Thomasenia Sales, M.D. ( 02/21/2012 11:23:14 )    Anti-infectives: Anti-infectives     Start      Dose/Rate Route Frequency Ordered Stop   02/25/12 0000   vancomycin (VANCOCIN) IVPB 1000 mg/200 mL premix  Status:  Discontinued        1,000 mg 200 mL/hr over 60 Minutes Intravenous To Surgery 02/24/12 0953 02/24/12 1004   02/25/12 0000   vancomycin (VANCOCIN) 1,500 mg in sodium chloride 0.9 % 500 mL IVPB        1,500 mg 250 mL/hr over 120 Minutes Intravenous To Surgery 02/24/12 1007 02/25/12 1202   02/21/12 2200   sulfamethoxazole-trimethoprim (BACTRIM DS) 800-160 MG per tablet 2 tablet  Status:  Discontinued        2 tablet Oral Every 12 hours 02/21/12 1717 02/21/12 1735   02/21/12 2000   moxifloxacin (AVELOX) tablet 400 mg        400 mg Oral Daily 02/21/12 1717     02/21/12 2000  sulfamethoxazole-trimethoprim (BACTRIM DS) 800-160 MG per tablet 2 tablet       2 tablet Oral Every 12 hours 02/21/12 1735     02/21/12 1230   vancomycin (VANCOCIN) IVPB 1000 mg/200 mL premix        1,000 mg 200 mL/hr over 60 Minutes Intravenous  Once 02/21/12 1217 02/21/12 1359          Assessment/Plan: s/p Procedure(s) (LRB): AMPUTATION DIGIT (Left) Walking with Darco shoe.  Will check wds in AM. Should be OK for dc in AM from Vasc standpoint.   LOS: 5 days   Trevor Thompson 02/26/2012, 9:33 AM

## 2012-02-27 DIAGNOSIS — I951 Orthostatic hypotension: Secondary | ICD-10-CM

## 2012-02-27 DIAGNOSIS — N179 Acute kidney failure, unspecified: Secondary | ICD-10-CM

## 2012-02-27 LAB — URINALYSIS, ROUTINE W REFLEX MICROSCOPIC
Glucose, UA: NEGATIVE mg/dL
Hgb urine dipstick: NEGATIVE
Ketones, ur: NEGATIVE mg/dL
Protein, ur: 30 mg/dL — AB
Urobilinogen, UA: 1 mg/dL (ref 0.0–1.0)

## 2012-02-27 LAB — CBC WITH DIFFERENTIAL/PLATELET
Basophils Relative: 0 % (ref 0–1)
Eosinophils Relative: 2 % (ref 0–5)
HCT: 38.4 % — ABNORMAL LOW (ref 39.0–52.0)
Lymphs Abs: 1.6 10*3/uL (ref 0.7–4.0)
MCH: 27.6 pg (ref 26.0–34.0)
MCV: 84.8 fL (ref 78.0–100.0)
Monocytes Absolute: 2.7 10*3/uL — ABNORMAL HIGH (ref 0.1–1.0)
Monocytes Relative: 15 % — ABNORMAL HIGH (ref 3–12)
Neutro Abs: 13.5 10*3/uL — ABNORMAL HIGH (ref 1.7–7.7)
RBC: 4.53 MIL/uL (ref 4.22–5.81)
WBC: 18.2 10*3/uL — ABNORMAL HIGH (ref 4.0–10.5)

## 2012-02-27 LAB — BASIC METABOLIC PANEL
BUN: 14 mg/dL (ref 6–23)
CO2: 26 mEq/L (ref 19–32)
Chloride: 100 mEq/L (ref 96–112)
Creatinine, Ser: 1.43 mg/dL — ABNORMAL HIGH (ref 0.50–1.35)
Glucose, Bld: 109 mg/dL — ABNORMAL HIGH (ref 70–99)

## 2012-02-27 LAB — WOUND CULTURE
Culture: NO GROWTH
Gram Stain: NONE SEEN

## 2012-02-27 LAB — URINE MICROSCOPIC-ADD ON

## 2012-02-27 LAB — GLUCOSE, CAPILLARY
Glucose-Capillary: 115 mg/dL — ABNORMAL HIGH (ref 70–99)
Glucose-Capillary: 85 mg/dL (ref 70–99)

## 2012-02-27 MED ORDER — INSULIN ASPART 100 UNIT/ML ~~LOC~~ SOLN: 12.0000 [IU] | Freq: Three times a day (TID) | SUBCUTANEOUS | Status: DC

## 2012-02-27 MED ORDER — HYDROCODONE-ACETAMINOPHEN 5-325 MG PO TABS: 1.0000 | ORAL_TABLET | ORAL | Status: AC | PRN

## 2012-02-27 MED ORDER — SODIUM CHLORIDE 0.9 % IV SOLN
INTRAVENOUS | Status: DC
  Administered 2012-02-27 – 2012-02-29 (×5): via INTRAVENOUS

## 2012-02-27 MED ORDER — GABAPENTIN 300 MG PO CAPS: 300.0000 mg | ORAL_CAPSULE | Freq: Three times a day (TID) | ORAL | Status: DC

## 2012-02-27 NOTE — Progress Notes (Signed)
Physical Therapy Evaluation Patient Details Name: Trevor Thompson MRN: [id number] DOB: 28-May-1961 Today's Date: 02/27/2012 Time: 2725-3664 PT Time Calculation (min): 34 min  PT Assessment / Plan / Recommendation Clinical Impression  51 yo admitted with left 2nd toe necrosis and ulceration of 5th met head; PT re-eval completed at request of MD, and pt appropriately stating he is unsteady on feet; Pt presents with ability to amb with WBing on Left heel in Darco shoe, and with proper use of cane as educated, and managed reasonably well, though with 2 losses of balance;   Pt is declining RW, which is worth consideration as he had those losses of balance    PT Assessment  Patient needs continued PT services    Follow Up Recommendations  Home health PT    Barriers to Discharge        Equipment Recommendations  Cane Notified Case Manager and pt's primary Nurse of recommendations   Recommendations for Other Services     Frequency Min 3X/week    Precautions / Restrictions Precautions Precautions: Fall Required Braces or Orthoses: Other Brace/Splint Other Brace/Splint: Darco shoe L foot   Pertinent Vitals/Pain 8/10 initially; RN notified, pain meds given; Noted incr pain with WBing; 0/10 Left foot pain once elevated in recliner; Pt with Headache; BP 118/76 HR 96 with amb      Mobility  Bed Mobility Bed Mobility: Supine to Sit Supine to Sit: 6: Modified independent (Device/Increase time);HOB elevated Transfers Transfers: Sit to Stand;Stand to Sit Sit to Stand: 6: Modified independent (Device/Increase time);From toilet;From bed Stand to Sit: 6: Modified independent (Device/Increase time);To toilet;To bed Ambulation/Gait Ambulation/Gait Assistance: 4: Min guard;3: Mod assist (with and without physical contact) Ambulation Distance (Feet): 100 Feet (x2) Assistive device: Straight cane Ambulation/Gait Assistance Details: verbal and demo cues for technique, gait sequence; 2 losses of  balance requiring mod assist to regain steadiness -- these occured when pt speeded up, so cued pt to slow down Gait Pattern: Decreased stance time - left;Step-through pattern;Within Functional Limits Stairs: Yes Stairs Assistance: 4: Min guard Stair Management Technique: One rail Left;Forwards;With cane Number of Stairs: 12     Exercises     PT Diagnosis: Difficulty walking  PT Problem List: Decreased balance;Decreased mobility;Decreased knowledge of use of DME;Decreased safety awareness;Pain PT Treatment Interventions: DME instruction;Gait training;Stair training;Functional mobility training;Therapeutic activities;Therapeutic exercise;Balance training;Patient/family education   PT Goals Acute Rehab PT Goals PT Goal Formulation: With patient Time For Goal Achievement: 03/12/12 Potential to Achieve Goals: Good Pt will Ambulate: >150 feet;with modified independence;with least restrictive assistive device PT Goal: Ambulate - Progress: Goal set today Pt will Go Up / Down Stairs: Flight;with modified independence;with cane;with rail(s) PT Goal: Up/Down Stairs - Progress: Goal set today  Visit Information  Last PT Received On: 02/27/12 Assistance Needed: +1    Subjective Data  Subjective: Agreeable to amb and ractice steps; Declining RW when mentioned Patient Stated Goal: Wants to go to on trip to [place] (2 weeks early Sept) that he booked 7 months ago   Prior Functioning  Home Living Lives With: Spouse Available Help at Discharge: Family;Available 24 hours/day Type of Home: House Home Access: Stairs to enter [employer] of Steps: 2 Entrance Stairs-Rails: None Home Layout: Two level;Bed/bath upstairs Alternate Level Stairs-Number of Steps: 14 Alternate Level Stairs-Rails: Right Bathroom Shower/Tub: Engineer, manufacturing systems: Standard Home Adaptive Equipment: None Prior Function Level of Independence: Independent Able to Take Stairs?: Yes Driving:  Yes Communication Communication: No difficulties    Cognition  Overall Cognitive  Status: Appears within functional limits for tasks assessed/performed Arousal/Alertness: Awake/alert Orientation Level: Appears intact for tasks assessed Behavior During Session: [facility] for tasks performed Cognition - Other Comments: Slight decreased awareness of defecits; Pt flatly declining practice with RW, but with 2 losses of balance using cane    Extremity/Trunk Assessment Right Upper Extremity Assessment RUE ROM/Strength/Tone: [facility] for tasks assessed Left Upper Extremity Assessment LUE ROM/Strength/Tone: WFL for tasks assessed Right Lower Extremity Assessment RLE ROM/Strength/Tone: [facility] for tasks assessed Left Lower Extremity Assessment LLE ROM/Strength/Tone: WFL for tasks assessed (But quite painful with standing activities)   Balance    End of Session PT - End of Session Equipment Utilized During Treatment: Gait belt (Darco shoe L) Activity Tolerance: Other (comment) (with headache; his 2 losses of balance are concerning) Patient left: in chair;with call bell/phone within reach (readying for lunch) Nurse Communication: Mobility status;Precautions  GP     Van Clines [facility] [place], [place] [phone]  02/27/2012, 1:27 PM

## 2012-02-27 NOTE — Progress Notes (Signed)
Patient ID: Trevor Thompson, male   DOB: Nov 07, 1960, 51 y.o.   MRN: [id number] The patient is today status post left second toe amputation and debridement of the lateral aspect of his fifth metatarsal head. Dressing was removed today. There is no evidence of surrounding erythema. There does appear to be a good granulating base at the area of the partial thickness skin loss over the fifth metatarsal head. A second toe amputation site does not have any erythema and appears to be healing. He does have some blistering over the third toe which was seen at the time of surgery.  Feel that it is acceptable for the patient be discharged home. I did discuss local wound care with him in the importance of using a Darco shoe and walking. He plans a trip to [place] on September 4 for approximately 2-3 weeks. I will see him in the opposite on September 3. Did explain to the patient and to his wife by telephone that he is at significant risk for progressive tissue loss due to his distal tibial And even more distal disease into his foot. Nonetheless there is no alternative for further improvement we will see how he does with healing of his amputations

## 2012-02-27 NOTE — Progress Notes (Signed)
[facility] PROGRESS NOTE  Trevor Thompson ZOX:[id number] DOB: 02-13-1961 DOA: 02/21/2012 PCP: Sheila Oats, MD  Assessment/Plan: Principal Problem: Acute kidney injury Active Problems:  Cellulitis of left foot   ? 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 Acute kidney injury: The patient's creatinine has increased to 1.43 from admission level of 0.9. However in review of the patient's record he had a creatinine of 1.3 on 11/23/2011. The patient does not know what his baseline creatinine however I am concerned about the compounded effect of vancomycin, Bactrim, recent surgery and orthostatic hypotension the kidneys. I think is prudent for the patient to remain get hydration and reevaluate his renal function tomorrow. I've also spoke with patient about obtaining records his baseline creatinine he will contact his nephrologist on tomorrow to provide that information for [place]. 2. Orthostatic hypotension - autonomic: The patient demonstrated orthostatic hypotension both the symptoms and the drop in blood pressure. He states that this is not an unusual occurrence for him to have dizziness when standing up and walking. 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 The question remains as to whether or not the patient may require some midodrine to prevent complications from the hypotension. 3. Gangrene associated with diabetes: The patient was noted to have  diffuse peripheral vascular disease in a setting of gangrenous left second toe. He was evaluated by Dr. early obtain a vascular surgery. An arteriogram was performed and the patient subsequently underwent a transmetatarsal amputation of the left second toe and debridement of a blister over the left fifth metatarsal head. The patient has been given a Darco shoe with ambulation as tolerated. The patient was evaluated by physical therapy who recommends a rolling walker and home health therapy. 4. Diabetes type 1 uncontrolled: The patient has a hemoglobin A1c of 11.2 which indicates poor chronic control. However him Hoss with which his blood sugars well-controlled. The patient does not have a primary care physician in the area but will be given referrals for a hemorrhage or physicians at the time of discharge. He will need to further titrate his medications on a long-term basis. 5. Status post cadaver renal transplant: The patient is status post renal transplant and has good renal function. He is on immunosuppressive medications which have been continued throughout his hospital course. I've given him a reference for [facility] and he will call them to set up an appointment and transferred his care here to the greater area 6. Hypertension: Blood pressure is well-controlled and his hospitalization. 7. Leukocytosis: The patient has developed leukocytosis. Urinalysis was found to be negative. I suspect the leukocytosis is secondary to his use of prednisone on an ongoing basis. Code Status: Full code Family Communication: Not applicable Disposition Plan: Anticipate discharge to home in medically stable  Vivek Grealish A.  [facility] Pager [phone]. If 8PM-8AM, please contact night-coverage at [email or web address], password [facility] 02/27/2012, 2:50 PM  LOS: 6 days   Brief narrative: Patient is an 51 y.o. male with with a 25 year history of poorly controlled type 1 diabetes mellitus with  complications  of neuropathy, retinopathy and nephropathy. The patient is status post renal transplant. The patient is currently traveling in the [place] area and reports progressive pain over the past several days of the left foot. The patient reports that he lost the toenail on the left second toenail and over the course of 2 days the toe became progressively necrotic and more painful. The patient became concerned because he noticed a red streak going up the left leg. The patient reports increasing pain 6/10 on the plantar surface of the left foot. The patient also reports painful toes. The patient reports progressive cold intolerance. He has also had some fluctuating blood sugars that have been extreme. The patient reports no history of previous amputation. The patient reports that he was diagnosed with type 1 diabetes mellitus at the age of 66. The patient denies fever. Hospitalization was requested for further evaluation and management  Consultants:  Vein and vascular surgery-Dr. Early  Procedures:  Status post transmetatarsal amputation of left second toe  Antibiotics:  Bactrim 8/19 >> 8/24  Avelox 8/19 >> 8/24  Vancomycin 8/19 >> 8/23  HPI/Subjective: Patient states that he did not sleep well last night and does feel a bit tired today. He states that he has no significant pain at the area of the end dictation of the toe.  Objective: Filed Vitals:   02/27/12 0955 02/27/12 1320 02/27/12 1321 02/27/12 1322  BP: 123/76 129/81 109/81 109/71  Pulse: 85 92 94 52  Temp: 98.7 F (37.1 C) 98.9 F (37.2 C)    TempSrc: Oral Oral    Resp: 18 18    Height:      Weight:      SpO2: 94% 92%     Weight change: 3.02 kg (6 lb 10.5 oz) No intake or output data in the 24 hours ending 02/27/12 1450  General: Alert, awake, oriented x3, in no acute distress.  HEENT: [place]/AT PEERL, EOMI Heart: Regular rate and rhythm Lungs: Clear to auscultation, no wheezing or rhonchi noted. No increased vocal  fremitus resonant to percussion  Abdomen: Soft, nontender, nondistended, positive bowel sounds, no masses no hepatosplenomegaly noted..  Neuro: No focal neurological deficits noted Musculoskeletal: No warm swelling or erythema around joints.Left foot has postsurgical dressing applied   Data Reviewed: Basic Metabolic Panel:  Lab 02/27/12 1610 02/26/12 1226 02/23/12 0640 02/22/12 0442 02/21/12 1626 02/21/12 1014  NA 135 129* 136 135 -- 135  K 4.1 4.2 4.0 4.1 -- 4.8  CL 100 98 103 101 -- 101  CO2 26 21 25 26  -- 24  GLUCOSE 109* 110* 135* 179* -- 316*  BUN 14 13 15 20  -- 23  CREATININE 1.43* 1.32 1.20 0.98 0.91 --  CALCIUM 9.5 9.3 9.3 9.4 -- 10.6*  MG -- -- -- -- -- --  PHOS -- -- -- -- -- --   Liver Function Tests:  Lab 02/22/12 0442  AST 20  ALT 23  ALKPHOS 79  BILITOT 0.5  PROT 6.5  ALBUMIN 2.4*   No results found for this basename: LIPASE:5,AMYLASE:5 in the last 168 hours No results found for this basename: AMMONIA:5 in the last 168 hours CBC:  Lab 02/27/12 1130 02/26/12 1226 02/25/12 0625 02/23/12 0640 02/22/12 0442 02/21/12 1014  WBC 18.2* 16.4* 17.5* 14.2* 13.5* --  NEUTROABS 13.5* 11.8* -- -- -- 6.3  HGB 12.5* 13.5 12.6* 13.5 13.6 --  HCT 38.4* 41.5 38.6* 42.6 42.0 --  MCV 84.8 84.3 83.5 84.9 84.3 --  PLT 170 162 154 156  166 --   Cardiac Enzymes: No results found for this basename: CKTOTAL:5,CKMB:5,CKMBINDEX:5,TROPONINI:5 in the last 168 hours BNP (last 3 results) No results found for this basename: PROBNP:3 in the last 8760 hours CBG:  Lab 02/27/12 1130 02/27/12 0743 02/27/12 0432 02/26/12 2130 02/26/12 1642  GLUCAP 115* 116* 115* 105* 99    Recent Results (from the past 240 hour(s))  WOUND CULTURE     Status: Normal   Collection Time   02/25/12 11:39 AM      Component Value Range Status Comment   Specimen Description WOUND LEFT FOOT   Final    Special Requests PT ON VANCO   Final    Gram Stain     Final    Value: NO WBC SEEN     NO SQUAMOUS  EPITHELIAL CELLS SEEN     NO ORGANISMS SEEN   Culture NO GROWTH 2 DAYS   Final    Report Status 02/27/2012 FINAL   Final   ANAEROBIC CULTURE     Status: Normal (Preliminary result)   Collection Time   02/25/12 11:39 AM      Component Value Range Status Comment   Specimen Description WOUND LEFT FOOT   Final    Special Requests PT ON VANCO   Final    Gram Stain PENDING   Incomplete    Culture     Final    Value: NO ANAEROBES ISOLATED; CULTURE IN PROGRESS FOR 5 DAYS   Report Status PENDING   Incomplete      Studies: Dg Chest Port 1 View  02/21/2012  *RADIOLOGY REPORT*  Clinical Data: Foot pain.  Renal transplant patient.  Admission.  PORTABLE CHEST - 1 VIEW  Comparison: None.  Findings: Streaky opacity is seen in the right perihilar region, which may be due to atelectasis, scarring, or developing infiltrate.  Left lung is clear.  No evidence of pleural effusion. Heart size is within normal limits.  IMPRESSION: Right perihilar opacity, which may be due to atelectasis, scarring, or developing infiltrate.  Radiographic followup is recommended.   Original Report Authenticated By: Danae Orleans, M.D.    Dg Foot Complete Left  02/21/2012  *RADIOLOGY REPORT*  Clinical Data: Pain and necrosis of the second toe.  LEFT FOOT - COMPLETE 3+ VIEW  Comparison: None.  Findings: There is no bony abnormality.  No evidence of fracture, dislocation or osteomyelitis.  There is extensive vascular calcification throughout the region.  IMPRESSION: Extensive vascular calcification.  No bony finding.   Original Report Authenticated By: Thomasenia Sales, M.D. ( 02/21/2012 11:23:14 )     Scheduled Meds:    . docusate sodium  100 mg Oral BID  . gabapentin  300 mg Oral TID  . heparin  5,000 Units Subcutaneous Q8H  . insulin aspart  0-15 Units Subcutaneous TID WC  . insulin aspart  10 Units Subcutaneous TID WC  . insulin glargine  32 Units Subcutaneous BID  . metoprolol tartrate  25 mg Oral BID  . mycophenolate  360 mg  Oral TID  . omega-3 acid ethyl esters  2 g Oral BID  . pantoprazole  40 mg Oral Q breakfast  . predniSONE  5 mg Oral Q breakfast  . saccharomyces boulardii  250 mg Oral BID  . tacrolimus  4 mg Oral BID  . DISCONTD: moxifloxacin  400 mg Oral Q2000  . DISCONTD: sulfamethoxazole-trimethoprim  2 tablet Oral Q12H   Continuous Infusions:    . sodium chloride    . DISCONTD:  sodium chloride 75 mL/hr (02/26/12 0026)    Principal Problem:  *Necrotic toe Active Problems:  Cellulitis of left foot   ? 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

## 2012-02-28 ENCOUNTER — Inpatient Hospital Stay (HOSPITAL_COMMUNITY): Payer: Medicare Other

## 2012-02-28 ENCOUNTER — Telehealth: Payer: Self-pay | Admitting: Vascular Surgery

## 2012-02-28 DIAGNOSIS — M869 Osteomyelitis, unspecified: Secondary | ICD-10-CM

## 2012-02-28 DIAGNOSIS — R509 Fever, unspecified: Secondary | ICD-10-CM

## 2012-02-28 LAB — BASIC METABOLIC PANEL
Chloride: 102 mEq/L (ref 96–112)
Creatinine, Ser: 1.34 mg/dL (ref 0.50–1.35)
GFR calc Af Amer: 69 mL/min — ABNORMAL LOW (ref >90)
Sodium: 134 mEq/L — ABNORMAL LOW (ref 135–145)

## 2012-02-28 LAB — GLUCOSE, CAPILLARY
Glucose-Capillary: 119 mg/dL — ABNORMAL HIGH (ref 70–99)
Glucose-Capillary: 133 mg/dL — ABNORMAL HIGH (ref 70–99)
Glucose-Capillary: 110 mg/dL — ABNORMAL HIGH (ref 70–99)
Glucose-Capillary: 155 mg/dL — ABNORMAL HIGH (ref 70–99)

## 2012-02-28 MED ORDER — LEVOFLOXACIN 750 MG PO TABS
750.0000 mg | ORAL_TABLET | ORAL | Status: DC
  Administered 2012-02-28 – 2012-03-01 (×3): 750 mg via ORAL
  Filled 2012-02-28 (×4): qty 1

## 2012-02-28 MED ORDER — SULFAMETHOXAZOLE-TMP DS 800-160 MG PO TABS
2.0000 | ORAL_TABLET | Freq: Two times a day (BID) | ORAL | Status: DC
  Administered 2012-02-28 – 2012-03-01 (×5): 2 via ORAL
  Filled 2012-02-28 (×6): qty 2

## 2012-02-28 NOTE — Consult Note (Signed)
[facility]    Date of Admission:  02/21/2012  Date of Consult:  02/28/2012  Reason for Consult:gangrenous toe sp amputation Referring Physician: Dr. Ashley Royalty   HPI: Trevor Thompson is an 51 y.o. male with DM, ESRD sp renal transplant, PVD who moved to [place] from [place]. He had pain and color changes in his second toe over past 3 weeks. A nail fell off and lesion became progressively necrotic with erythema streaking up the leg. He was admitted to [facility] and started on very broad spectrum antibiotics in the form of vancomycin moxifloxacin and Bactrim. He ultimately seen by vascular surgery who performed Transmetatarsal amputation left second toe and debridement of blister over the lateral fifth metatarsal head on 02/25/12> INtraoperative cultures were no growth. We were consulted re antibiotic management of this infection    Past Medical History  Diagnosis Date  . Type 1 diabetes mellitus, uncontrolled   . Hypertension   . Renal disorder   . PVD (peripheral vascular disease)   . S/p cadaver renal transplant   . Cold intolerance   . 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 GERD (gastroesophageal reflux disease)   . 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 Hypertriglyceridemia   . Diabetic retinopathy     Past Surgical History  Procedure Date  . Kidney transplant 2004    right  . Cataract extraction w/ intraocular lens  implant, bilateral ~ 1997  . Av fistula placement 1997-2004    "I've got 3; right upper arm; right forearm; left forearm "  . Av fistula repair 1997-2004    "multiple"  ergies:   Allergies  Allergen Reactions  . Penicillins Other (See Comments)    Since birth     Medications: I have reviewed patients current medications as documented in Epic Anti-infectives     Start     Dose/Rate Route Frequency Ordered Stop   02/25/12 0000   vancomycin (VANCOCIN) IVPB 1000 mg/200 mL premix  Status:  Discontinued        1,000 mg 200 mL/hr over 60 Minutes Intravenous To Surgery 02/24/12 0953 02/24/12 1004   02/25/12 0000   vancomycin (VANCOCIN) 1,500 mg in sodium chloride 0.9 % 500 mL IVPB        1,500 mg 250 mL/hr over 120 Minutes Intravenous To Surgery 02/24/12 1007 02/25/12 1202   02/21/12 2200   sulfamethoxazole-trimethoprim (BACTRIM DS) 800-160 MG per tablet 2 tablet  Status:  Discontinued        2 tablet Oral Every 12 hours 02/21/12 1717 02/21/12 1735   02/21/12 2000   moxifloxacin (AVELOX) tablet 400 mg  Status:  Discontinued        400 mg Oral Daily 02/21/12 1717 02/26/12 1828   02/21/12 2000   sulfamethoxazole-trimethoprim (BACTRIM DS) 800-160 MG per tablet 2 tablet  Status:  Discontinued        2 tablet Oral Every 12 hours 02/21/12 1735 02/26/12 1827   02/21/12 1230   vancomycin (VANCOCIN) IVPB 1000 mg/200 mL premix        1,000 mg 200 mL/hr over 60 Minutes Intravenous  Once 02/21/12 1217 02/21/12 1359          Social History:  reports that he quit smoking about 30 years ago. His smoking use included Cigarettes. He has a 1.5 pack-year smoking history. He has never used smokeless tobacco. He reports that he drinks about .6 ounces of alcohol per week. He reports that he uses illicit drugs (  Marijuana).  Family History  Problem Relation Age of Onset  . Diabetes Mother   . Diabetes Father   . Lupus Sister     As in HPI and primary teams notes otherwise 12 point review of systems is negative  Blood pressure 153/88, pulse 112, temperature 100 F (37.8 C), temperature source Oral, resp. rate 24, height 5\' 11"  (1.803 m), weight 259 lb 11.2 oz (117.799 kg), SpO2 91.00%. General: Alert and awake, oriented x3, not in any acute distress. HEENT: anicteric sclera, pupils reactive to light and accommodation, EOMI, oropharynx clear and without exudate CVS regular rate, normal r,  no murmur rubs or gallops Chest: clear to auscultation bilaterally, no wheezing, rales or rhonchi Abdomen: soft  nontender, nondistended, normal bowel sounds, Extremities: foot with dressing Skin: prior graft sites and fistula sites visible Neuro: nonfocal, strength and sensation intact   Results for orders placed during the hospital encounter of 02/21/12 (from the past 48 hour(s))  GLUCOSE, CAPILLARY     Status: Normal   Collection Time   02/26/12  4:42 PM      Component Value Range Comment   Glucose-Capillary 99  70 - 99 mg/dL   GLUCOSE, CAPILLARY     Status: Abnormal   Collection Time   02/26/12  9:30 PM      Component Value Range Comment   Glucose-Capillary 105 (*) 70 - 99 mg/dL   GLUCOSE, CAPILLARY     Status: Abnormal   Collection Time   02/27/12  4:32 AM      Component Value Range Comment   Glucose-Capillary 115 (*) 70 - 99 mg/dL   GLUCOSE, CAPILLARY     Status: Abnormal   Collection Time   02/27/12  7:43 AM      Component Value Range Comment   Glucose-Capillary 116 (*) 70 - 99 mg/dL   URINALYSIS, ROUTINE W REFLEX MICROSCOPIC     Status: Abnormal   Collection Time   02/27/12 11:29 AM      Component Value Range Comment   Color, Urine AMBER (*) YELLOW BIOCHEMICALS MAY BE AFFECTED BY COLOR   APPearance CLEAR  CLEAR    Specific Gravity, Urine 1.017  1.005 - 1.030    pH 6.0  5.0 - 8.0    Glucose, UA NEGATIVE  NEGATIVE mg/dL    Hgb urine dipstick NEGATIVE  NEGATIVE    Bilirubin Urine SMALL (*) NEGATIVE    Ketones, ur NEGATIVE  NEGATIVE mg/dL    Protein, ur 30 (*) NEGATIVE mg/dL    Urobilinogen, UA 1.0  0.0 - 1.0 mg/dL    Nitrite NEGATIVE  NEGATIVE    Leukocytes, UA NEGATIVE  NEGATIVE   URINE MICROSCOPIC-ADD ON     Status: Abnormal   Collection Time   02/27/12 11:29 AM      Component Value Range Comment   Squamous Epithelial / LPF FEW (*) RARE    WBC, UA 0-2  <3 WBC/hpf    Sperm, UA PRESENT     CBC WITH DIFFERENTIAL     Status: Abnormal   Collection Time   02/27/12 11:30 AM      Component Value Range Comment   WBC 18.2 (*) 4.0 - 10.5 K/uL    RBC 4.53  4.22 - 5.81 MIL/uL     Hemoglobin 12.5 (*) 13.0 - 17.0 g/dL    HCT 40.9 (*) 81.1 - 52.0 %    MCV 84.8  78.0 - 100.0 fL    MCH 27.6  26.0 - 34.0 pg  MCHC 32.6  30.0 - 36.0 g/dL    RDW 16.1  09.6 - 04.5 %    Platelets 170  150 - 400 K/uL    Neutrophils Relative 74  43 - 77 %    Lymphocytes Relative 9 (*) 12 - 46 %    Monocytes Relative 15 (*) 3 - 12 %    Eosinophils Relative 2  0 - 5 %    Basophils Relative 0  0 - 1 %    Neutro Abs 13.5 (*) 1.7 - 7.7 K/uL    Lymphs Abs 1.6  0.7 - 4.0 K/uL    Monocytes Absolute 2.7 (*) 0.1 - 1.0 K/uL    Eosinophils Absolute 0.4  0.0 - 0.7 K/uL    Basophils Absolute 0.0  0.0 - 0.1 K/uL    Smear Review MORPHOLOGY UNREMARKABLE     BASIC METABOLIC PANEL     Status: Abnormal   Collection Time   02/27/12 11:30 AM      Component Value Range Comment   Sodium 135  135 - 145 mEq/L    Potassium 4.1  3.5 - 5.1 mEq/L    Chloride 100  96 - 112 mEq/L    CO2 26  19 - 32 mEq/L    Glucose, Bld 109 (*) 70 - 99 mg/dL    BUN 14  6 - 23 mg/dL    Creatinine, Ser 4.09 (*) 0.50 - 1.35 mg/dL    Calcium 9.5  8.4 - 81.1 mg/dL    GFR calc non Af Amer 55 (*) >90 mL/min    GFR calc Af Amer 64 (*) >90 mL/min   GLUCOSE, CAPILLARY     Status: Abnormal   Collection Time   02/27/12 11:30 AM      Component Value Range Comment   Glucose-Capillary 115 (*) 70 - 99 mg/dL   GLUCOSE, CAPILLARY     Status: Normal   Collection Time   02/27/12  4:34 PM      Component Value Range Comment   Glucose-Capillary 85  70 - 99 mg/dL   GLUCOSE, CAPILLARY     Status: Abnormal   Collection Time   02/27/12 10:08 PM      Component Value Range Comment   Glucose-Capillary 119 (*) 70 - 99 mg/dL   BASIC METABOLIC PANEL     Status: Abnormal   Collection Time   02/28/12  5:19 AM      Component Value Range Comment   Sodium 134 (*) 135 - 145 mEq/L    Potassium 3.8  3.5 - 5.1 mEq/L    Chloride 102  96 - 112 mEq/L    CO2 24  19 - 32 mEq/L    Glucose, Bld 138 (*) 70 - 99 mg/dL    BUN 11  6 - 23 mg/dL    Creatinine, Ser 9.14   0.50 - 1.35 mg/dL    Calcium 9.1  8.4 - 78.2 mg/dL    GFR calc non Af Amer 60 (*) >90 mL/min    GFR calc Af Amer 69 (*) >90 mL/min   GLUCOSE, CAPILLARY     Status: Abnormal   Collection Time   02/28/12  7:43 AM      Component Value Range Comment   Glucose-Capillary 133 (*) 70 - 99 mg/dL    Comment 1 Documented in Chart      Comment 2 Notify RN     GLUCOSE, CAPILLARY     Status: Abnormal   Collection Time   02/28/12  11:50 AM      Component Value Range Comment   Glucose-Capillary 110 (*) 70 - 99 mg/dL    Comment 1 Documented in Chart      Comment 2 Notify RN         Component Value Date/Time   SDES WOUND LEFT FOOT 02/25/2012 1139   SDES WOUND LEFT FOOT 02/25/2012 1139   SPECREQUEST PT ON VANCO 02/25/2012 1139   SPECREQUEST PT ON VANCO 02/25/2012 1139   CULT NO GROWTH 2 DAYS 02/25/2012 1139   CULT NO ANAEROBES ISOLATED; CULTURE IN PROGRESS FOR 5 DAYS 02/25/2012 1139   REPTSTATUS 02/27/2012 FINAL 02/25/2012 1139   REPTSTATUS PENDING 02/25/2012 1139   Dg Chest 2 View  02/28/2012  *RADIOLOGY REPORT*  Clinical Data: 51 year old male with shortness of breath tachycardia weakness.  Surgery 2 days ago.  CHEST - 2 VIEW  Comparison: Preoperative study 02/21/2012.  Findings: Lower lung volumes.  Crowding of markings.  Vascular congestion may be mildly increased there is no overt edema. Probable scarring in the right midlung appears unchanged.  No pleural effusion or pneumothorax.  Stable cardiac size and mediastinal contours.  Visualized tracheal air column is within normal limits.  No acute osseous abnormality identified.  IMPRESSION: Lower lung volumes with atelectasis and perhaps mild vascular congestion but no edema.  Right mid lung scarring.   Original Report Authenticated By: Harley Hallmark, M.D.    Ct Head Wo Contrast  02/28/2012  *RADIOLOGY REPORT*  Clinical Data: Headache  CT HEAD WITHOUT CONTRAST  Technique:  Contiguous axial images were obtained from the base of the skull through the vertex  without contrast.  Comparison: None.  Findings: Ventricle size is normal.  Negative for acute infarct. Negative for hemorrhage or mass. 7 mm   low density cyst in the right basal ganglia appears to represent a perivascular space.  Skull is intact.  Mild chronic sinusitis.  Calcification in the carotid  and vertebral arteries compatible with diabetes.  IMPRESSION: No acute intracranial abnormality.   Original Report Authenticated By: Camelia Phenes, M.D.      Recent Results (from the past 720 hour(s))  WOUND CULTURE     Status: Normal   Collection Time   02/25/12 11:39 AM      Component Value Range Status Comment   Specimen Description WOUND LEFT FOOT   Final    Special Requests PT ON VANCO   Final    Gram Stain     Final    Value: NO WBC SEEN     NO SQUAMOUS EPITHELIAL CELLS SEEN     NO ORGANISMS SEEN   Culture NO GROWTH 2 DAYS   Final    Report Status 02/27/2012 FINAL   Final   ANAEROBIC CULTURE     Status: Normal (Preliminary result)   Collection Time   02/25/12 11:39 AM      Component Value Range Status Comment   Specimen Description WOUND LEFT FOOT   Final    Special Requests PT ON VANCO   Final    Gram Stain PENDING   Incomplete    Culture     Final    Value: NO ANAEROBES ISOLATED; CULTURE IN PROGRESS FOR 5 DAYS   Report Status PENDING   Incomplete      Impression/Recommendation  22 old diabetic with peripheral vascular disease previously on dialysis now with a renal transplant admitted with necrotic toe status post metatarsal amputation  1)  Necrotic toe sp amputation: NO growth bacteria on  abx. -- I would favor giving him a two-week course of "mop up" antibiotics in the form of Bactrim, along with levofloxacin a dose of 750 mg daily.  --Also check a baseline sedimentation rate and C-reactive protein --Be happy see him back in my clinic in approximately 3 weeks' time.  2) screening: check HIV.   Thank you so much for this interesting consult  [facility] [phone] (pager) [phone] (office) 02/28/2012, 3:09 PM  Paulette Blanch [place] 02/28/2012, 3:09 PM

## 2012-02-28 NOTE — Progress Notes (Signed)
Physical Therapy Treatment Patient Details Name: Trevor Thompson MRN: [id number] DOB: 1961/01/18 Today's Date: 02/28/2012 Time: 6578-4696 PT Time Calculation (min): 20 min  PT Assessment / Plan / Recommendation Comments on Treatment Session  RW is clearly the most supportive device, and has pt much steadier with ambulation; Pt still declining RW use, despite recommendation to use it with amb; Still, improving activity tol with amb, pt without reports of dizziness today    Follow Up Recommendations  Home health PT    Barriers to Discharge        Equipment Recommendations  Rolling walker with 5" wheels (pt may decline RW (although it is best choice); would like to use cane)    Recommendations for Other Services    Frequency Min 3X/week   Plan Discharge plan remains appropriate    Precautions / Restrictions Precautions Precautions: Fall Required Braces or Orthoses: Other Brace/Splint Other Brace/Splint: Darco shoe L foot Restrictions Weight Bearing Restrictions: No   Pertinent Vitals/Pain No specific reports of dizziness or pain    Mobility  Bed Mobility Bed Mobility: Supine to Sit Supine to Sit: 6: Modified independent (Device/Increase time);HOB elevated Transfers Transfers: Sit to Stand;Stand to Sit Sit to Stand: 6: Modified independent (Device/Increase time);From toilet;From bed Stand to Sit: 6: Modified independent (Device/Increase time);To toilet;To bed;5: Supervision Details for Transfer Assistance: Sat impulsively to soveseat in front of sink; decr control of stand to sit Ambulation/Gait Ambulation/Gait Assistance: 4: Min guard (with and without physical contact) Ambulation Distance (Feet): 150 Feet Assistive device: Rolling walker Ambulation/Gait Assistance Details: Much steadier with RW without loss of balance, and with incr amb distance; Recommend pt use RW when he ambulates with staff here; Pt continues to decline RW (but the fact that he was agreeable to try RW shows  potentially some openness to RWuse . . . . ) Gait Pattern: Decreased stance time - left;Step-through pattern    Exercises     PT Diagnosis:    PT Problem List:   PT Treatment Interventions:     PT Goals Acute Rehab PT Goals Time For Goal Achievement: 03/12/12 Potential to Achieve Goals: Good Pt will Ambulate: >150 feet;with modified independence;with least restrictive assistive device PT Goal: Ambulate - Progress: Progressing toward goal Pt will Go Up / Down Stairs: Flight;with modified independence;with cane;with rail(s)  Visit Information  Last PT Received On: 02/28/12 Assistance Needed: +1    Subjective Data  Subjective: Agreeable to practicing with RW, though clearly voicing the fact that he doesn't want to use it Patient Stated Goal: Wants to go to on trip to [place] (2 weeks early Sept) that he booked 7 months ago   Cognition  Overall Cognitive Status: Appears within functional limits for tasks assessed/performed Arousal/Alertness: Awake/alert Orientation Level: Appears intact for tasks assessed Behavior During Session: [facility] for tasks performed Cognition - Other Comments: Unwilling to recognize need for the extra support of RW with amb    Balance     End of Session PT - End of Session Activity Tolerance: Patient tolerated treatment well Patient left: in chair;with call bell/phone within reach (in front of sink; awaiting a bed change before lying back down in bed; Nurse tech and RN notified) Nurse Communication: Mobility status;Precautions   GP     Van Clines [facility] [place], [place] [phone]  02/28/2012, 11:18 AM

## 2012-02-28 NOTE — Progress Notes (Signed)
[facility] PROGRESS NOTE  Trevor Thompson ZOX:[id number] DOB: 1961/02/21 DOA: 02/21/2012 PCP: Sheila Oats, MD  Assessment/Plan: Principal Problem: Acute kidney injury Active Problems:  Cellulitis of left foot   ? 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 Low Grade Fever: Pt stated today that he was feeling poorly and was noted to have a temp of 100 degrees and mild tachycardia. He has no clear indication of an infection, however may not reveal usual signs of an infection due to his immunosuppressed state. I will proceed with a CT scan of his head as he is complaining of a frontal headache without any clear signs of a sinusitis. If this is non-revealing than I will obtain a CXR but will hold on a repeat U/A as the urinalysis from yesterday is negative for elements of an infection.  2. Leukocytosis:The patient has developed leukocytosis. Urinalysis was found to be negative. I suspect the leukocytosis is secondary to his use of prednisone on an ongoing basis.However in light of the new fever in the setting of an immunosupressed state. Will consult ID. 3. Acute kidney injury: The patient's creatinine has improved to 1.34 since yesterday which is at his baseline of 1.3.from May of 2013. The patient does not know what his baseline creatinine. I've also spoke with patient about obtaining records his baseline creatinine he will contact his nephrologist on tomorrow to provide that information for [place]. 4. Orthostatic  hypotension - autonomic: The patient demonstrated orthostatic hypotension both the symptoms and the drop in blood pressure. He states that this is not an unusual occurrence for him to have dizziness when standing up and walking. 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 The question remains as to whether or not the patient may require some midodrine to prevent complications from the hypotension. 5. Gangrene associated with diabetes: The patient was noted to have diffuse peripheral vascular disease in a setting of gangrenous left second toe. He was evaluated by Dr. early obtain a vascular surgery. An arteriogram was performed and the patient subsequently underwent a transmetatarsal amputation of the left second toe and debridement of a blister over the left fifth metatarsal head. The patient has been given a Darco shoe with ambulation as tolerated. The patient was evaluated by physical therapy who recommends a rolling walker and home health therapy. 6. Diabetes type 1 uncontrolled: The patient has a hemoglobin A1c of 11.2 which indicates poor chronic control. However him Hoss with which his blood sugars well-controlled. The patient does not have a primary care physician in the area but will be given referrals for a hemorrhage or physicians at the time of discharge. He will need to further titrate his medications on a long-term basis. 7. Status post cadaver renal transplant: The patient is status post renal transplant and has good renal function. He is on immunosuppressive medications which have been continued throughout his hospital course. I've given him a reference for [facility] and he will call them to set up an appointment and transferred his care here to the greater area 8. Hypertension:  Blood pressure is well-controlled and his hospitalization. 9.  Code Status: Full code Family Communication: Not applicable Disposition  Plan: Anticipate discharge to home in medically stable  Earnestine Tuohey A.  [facility] Pager [phone]. If 8PM-8AM, please contact night-coverage at [email or web address], password [facility] 02/28/2012, 1:36 PM  LOS: 7 days   Brief narrative: Patient is an 51 y.o. male with with a 25 year history of poorly controlled type 1 diabetes mellitus with complications of neuropathy, retinopathy and nephropathy. The patient is status post renal transplant. The patient is currently traveling in the [place] area and reports progressive pain over the past several days of the left foot. The patient reports that he lost the toenail on the left second toenail and over the course of 2 days the toe became progressively necrotic and more painful. The patient became concerned because he noticed a red streak going up the left leg. The patient reports increasing pain 6/10 on the plantar surface of the left foot. The patient also reports painful toes. The patient reports progressive cold intolerance. He has also had some fluctuating blood sugars that have been extreme. The patient reports no history of previous amputation. The patient reports that he was diagnosed with type 1 diabetes mellitus at the age of 12. The patient denies fever. Hospitalization was requested for further evaluation and management  Consultants:  Vein and vascular surgery-Dr. Early  Procedures:  Status post transmetatarsal amputation of left second toe  Antibiotics:  Bactrim 8/19 >> 8/24  Avelox 8/19 >> 8/24  Vancomycin 8/19 >> 8/23  HPI/Subjective: Patient states that he did not sleep well last night and does feel a bit tired today. He states that he has no significant pain at the area of the end dictation of the toe.  Objective: Filed Vitals:   02/27/12 2203 02/28/12 0534 02/28/12 0827 02/28/12 0839  BP: 121/73 150/86  153/88  Pulse: 85 118  112  Temp: 98.7 F (37.1 C) 100 F (37.8 C) 100.1 F (37.8 C) 100 F (37.8 C)  TempSrc:  Oral Oral  Oral  Resp: 18 20  24   Height:      Weight: 117.799 kg (259 lb 11.2 oz)     SpO2: 95% 95%  91%   Weight change: 1.179 kg (2 lb 9.6 oz)  Intake/Output Summary (Last 24 hours) at 02/28/12 1336 Last data filed at 02/28/12 0840  Gross per 24 hour  Intake 753.33 ml  Output    600 ml  Net 153.33 ml    General: Alert, awake, oriented x3, in no acute distress.  HEENT: [place]/AT PEERL, EOMI. No sinus tenderness Heart: Regular rate and rhythm Lungs: Clear to auscultation, no wheezing or rhonchi noted. No increased vocal fremitus resonant to percussion  Abdomen: Soft, nontender, nondistended, positive bowel sounds, no masses no hepatosplenomegaly noted..  Neuro: No focal neurological deficits noted Musculoskeletal: Wound site appears clean and no warmth or erythema noted on left foot.    Data Reviewed: Basic Metabolic Panel:  Lab 02/28/12 4540 02/27/12 1130 02/26/12 1226 02/23/12 0640 02/22/12 0442  NA 134* 135 129* 136 135  K 3.8 4.1 4.2 4.0 4.1  CL 102 100 98 103 101  CO2 24 26 21 25 26   GLUCOSE 138* 109* 110* 135* 179*  BUN 11 14 13 15 20   CREATININE 1.34 1.43* 1.32 1.20 0.98  CALCIUM 9.1 9.5 9.3 9.3 9.4  MG -- -- -- -- --  PHOS -- -- -- -- --   Liver Function Tests:  Lab 02/22/12 0442  AST 20  ALT 23  ALKPHOS 79  BILITOT 0.5  PROT 6.5  ALBUMIN 2.4*   No results found for this basename: LIPASE:5,AMYLASE:5 in the last 168 hours No results found for this basename: AMMONIA:5 in the last 168 hours CBC:  Lab 02/27/12 1130 02/26/12 1226 02/25/12 0625 02/23/12 0640 02/22/12 0442  WBC 18.2* 16.4* 17.5* 14.2* 13.5*  NEUTROABS 13.5* 11.8* -- -- --  HGB 12.5* 13.5 12.6* 13.5 13.6  HCT 38.4* 41.5 38.6* 42.6 42.0  MCV 84.8 84.3 83.5 84.9 84.3  PLT 170 162 154 156 166   Cardiac Enzymes: No results found for this basename: CKTOTAL:5,CKMB:5,CKMBINDEX:5,TROPONINI:5 in the last 168 hours BNP (last 3 results) No results found for this basename: PROBNP:3 in the last 8760  hours CBG:  Lab 02/28/12 1150 02/28/12 0743 02/27/12 2208 02/27/12 1634 02/27/12 1130  GLUCAP 110* 133* 119* 85 115*    Recent Results (from the past 240 hour(s))  WOUND CULTURE     Status: Normal   Collection Time   02/25/12 11:39 AM      Component Value Range Status Comment   Specimen Description WOUND LEFT FOOT   Final    Special Requests PT ON VANCO   Final    Gram Stain     Final    Value: NO WBC SEEN     NO SQUAMOUS EPITHELIAL CELLS SEEN     NO ORGANISMS SEEN   Culture NO GROWTH 2 DAYS   Final    Report Status 02/27/2012 FINAL   Final   ANAEROBIC CULTURE     Status: Normal (Preliminary result)   Collection Time   02/25/12 11:39 AM      Component Value Range Status Comment   Specimen Description WOUND LEFT FOOT   Final    Special Requests PT ON VANCO   Final    Gram Stain PENDING   Incomplete    Culture     Final    Value: NO ANAEROBES ISOLATED; CULTURE IN PROGRESS FOR 5 DAYS   Report Status PENDING   Incomplete      Studies: Dg Chest Port 1 View  02/21/2012  *RADIOLOGY REPORT*  Clinical Data: Foot pain.  Renal transplant patient.  Admission.  PORTABLE CHEST - 1 VIEW  Comparison: None.  Findings: Streaky opacity is seen in the right perihilar region, which may be due to atelectasis, scarring, or developing infiltrate.  Left lung is clear.  No evidence of pleural effusion. Heart size is within normal limits.  IMPRESSION: Right perihilar opacity, which may be due to atelectasis, scarring, or developing infiltrate.  Radiographic followup is recommended.   Original Report Authenticated By: Danae Orleans, M.D.    Dg Foot Complete Left  02/21/2012  *RADIOLOGY REPORT*  Clinical Data: Pain and necrosis of the second toe.  LEFT FOOT - COMPLETE 3+ VIEW  Comparison: None.  Findings: There is no bony abnormality.  No evidence of fracture, dislocation or osteomyelitis.  There is extensive vascular calcification throughout the region.  IMPRESSION: Extensive vascular calcification.  No bony  finding.   Original Report Authenticated By: Thomasenia Sales, M.D. ( 02/21/2012 11:23:14 )     Scheduled Meds:    . docusate sodium  100 mg Oral BID  . gabapentin  300 mg Oral TID  . heparin  5,000 Units Subcutaneous Q8H  . insulin aspart  0-15 Units Subcutaneous TID WC  . insulin aspart  10 Units Subcutaneous TID WC  . insulin glargine  32 Units Subcutaneous BID  .  metoprolol tartrate  25 mg Oral BID  . mycophenolate  360 mg Oral TID  . omega-3 acid ethyl esters  2 g Oral BID  . pantoprazole  40 mg Oral Q breakfast  . predniSONE  5 mg Oral Q breakfast  . saccharomyces boulardii  250 mg Oral BID  . tacrolimus  4 mg Oral BID   Continuous Infusions:    . sodium chloride 100 mL/hr at 02/27/12 2358    Principal Problem:  *Acute kidney injury Active Problems:  Cellulitis of left foot  Necrotic toe   ? 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

## 2012-02-28 NOTE — Telephone Encounter (Addendum)
Message copied by Shari Prows on Mon Feb 28, 2012 11:22 AM ------      Message from: Melene Plan      Created: Mon Feb 28, 2012 10:03 AM                   ----- Message -----         From: Larina Earthly, MD         Sent: 02/27/2012  11:44 AM           To: Reuel Derby, Melene Plan, RN            The patient is discharged today by the medical service. I need to see him for office visit on September 3 before he leaves town on September 4. No vascular lab needed            Melany Guernsey and Remonia Richter both with a low level followup visit on Sunday  I scheduled an appt for the above patient on 03/07/12 at 1:30pm. I had to leave a voice message on the pt's phone #. [email or web address]

## 2012-02-28 NOTE — Progress Notes (Signed)
   CARE MANAGEMENT NOTE 02/28/2012  Patient:  Trevor Thompson, Trevor Thompson   Account Number:  [id number]  Date Initiated:  02/25/2012  Documentation initiated by:  Darlyne Russian  Subjective/Objective Assessment:   Patient admitted with gangrene of toe.     Action/Plan:   Progression of care and discharge planning   Anticipated DC Date:     Anticipated DC Plan:        DC Planning Services  CM consult      [facility] Choice  HOME HEALTH   Choice offered to / List presented to:  C-1 Patient   DME arranged  Levan Hurst      DME agency  [facility]     HH arranged  HH-1 RN  HH-2 PT      [facility] agency  [facility]   Status of service:  In process, will continue to follow Medicare Important Message given?   (If response is "NO", the following Medicare IM given date fields will be blank) Date Medicare IM given:   Date Additional Medicare IM given:    Discharge Disposition:    Per UR Regulation:    If discussed at Long Length of Stay Meetings, dates discussed:    Comments:  02/28/2012  1530 Darlyne Russian RN, [place]  [phone] Met with patient to discuss discharge planning and home health services for RN, PT and RW.  Verified current address:  [address] [place], [place]  [address]  Phone  [phone]  Provided listing for [facility] home health agencies. Exolained the RN will provide dressing care to foot, the frequency of visits will be determined with the first visit and the status. Patient selected [facility].  Patient states he has an appointment with Dr Arbie Cookey on 03/07/2012.  He will travel to [place] on 03/08/2012 and return 03/25/2012.  Rich Fuchs: [phone] referral for RN and PT.  [facility]: referral for RW

## 2012-02-29 ENCOUNTER — Encounter (HOSPITAL_COMMUNITY): Payer: Self-pay | Admitting: Vascular Surgery

## 2012-02-29 LAB — GLUCOSE, CAPILLARY
Glucose-Capillary: 104 mg/dL — ABNORMAL HIGH (ref 70–99)
Glucose-Capillary: 68 mg/dL — ABNORMAL LOW (ref 70–99)
Glucose-Capillary: 110 mg/dL — ABNORMAL HIGH (ref 70–99)
Glucose-Capillary: 109 mg/dL — ABNORMAL HIGH (ref 70–99)

## 2012-02-29 LAB — CBC WITH DIFFERENTIAL/PLATELET
Basophils Absolute: 0 10*3/uL (ref 0.0–0.1)
Basophils Relative: 0 % (ref 0–1)
Eosinophils Relative: 2 % (ref 0–5)
HCT: 38.4 % — ABNORMAL LOW (ref 39.0–52.0)
Hemoglobin: 12.8 g/dL — ABNORMAL LOW (ref 13.0–17.0)
Lymphocytes Relative: 16 % (ref 12–46)
Lymphs Abs: 3.4 10*3/uL (ref 0.7–4.0)
MCV: 83.8 fL (ref 78.0–100.0)
Monocytes Relative: 14 % — ABNORMAL HIGH (ref 3–12)
Neutro Abs: 14.3 10*3/uL — ABNORMAL HIGH (ref 1.7–7.7)
RBC: 4.58 MIL/uL (ref 4.22–5.81)
WBC: 21.1 10*3/uL — ABNORMAL HIGH (ref 4.0–10.5)

## 2012-02-29 LAB — BASIC METABOLIC PANEL
CO2: 27 mEq/L (ref 19–32)
Calcium: 9.9 mg/dL (ref 8.4–10.5)
Chloride: 102 mEq/L (ref 96–112)
Potassium: 3.7 mEq/L (ref 3.5–5.1)
Sodium: 139 mEq/L (ref 135–145)

## 2012-02-29 LAB — HIV ANTIBODY (ROUTINE TESTING W REFLEX): HIV: NONREACTIVE

## 2012-02-29 NOTE — Progress Notes (Signed)
[facility] PROGRESS NOTE  Trevor Thompson ZOX:[id number] DOB: 1960/09/06 DOA: 02/21/2012 PCP: Sheila Oats, MD  Assessment/Plan: Principal Problem: Acute kidney injury Active Problems:  Cellulitis of left foot   ? 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 Low Grade Fever: Pt feeling better but had Thompson MAXIMUM TEMPERATURE of  100 degrees and mild tachycardia. He has no clear indication of an infection, however may not reveal usual signs of an infection due to his immunosuppressed state. Thompson CT scan of the head was negative for any sinusitis, urinalysis was negative and blood and wound cultures so far have been negative also.  2. Leukocytosis:The patient has developed leukocytosis. Urinalysis was found to be negative. I suspect the leukocytosis is contributed to by his use of prednisone on an ongoing basis.However it does not account for such an elevation of white blood cell count. I consult infectious diseases who felt that the patient may have some residual infection and recommended continued antibiotics for "mop up" therapy. The patient was restarted on Levaquin and Bactrim on yesterday that she's on day #2 of antibiotics. We'll follow infectious disease recommendations  3. Acute kidney injury: The patient's creatinine has improved to 1.11 since yesterday which is at his baseline of 1.3.from May of 2013. The patient does not know what his baseline creatinine. I've also spoke with patient about obtaining records his  baseline creatinine he will contact his nephrologist on tomorrow to provide that information for [place]. I will decrease his IV fluids to [facility]. 4. Orthostatic hypotension - autonomic: The patient demonstrated orthostatic hypotension both the symptoms and the drop in blood pressure. He states that this is not an unusual occurrence for him to have dizziness when standing up and walking. 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 The question remains as to whether or not the patient may require some midodrine to prevent complications from the hypotension. 5. Gangrene associated with diabetes: The patient was noted to have diffuse peripheral vascular disease in Thompson setting of gangrenous left second toe. He was evaluated by Dr. early obtain Thompson vascular surgery. An arteriogram was performed and the patient subsequently underwent Thompson transmetatarsal amputation of the left second toe and debridement of Thompson blister over the left fifth metatarsal head. The patient has been given Thompson Darco shoe with ambulation as tolerated. The patient was evaluated by physical therapy who recommends Thompson rolling walker and home health therapy. 6. Diabetes type 1 uncontrolled: The patient has Thompson hemoglobin A1c of 11.2 which indicates poor chronic control. However him Hoss with which his blood sugars well-controlled. The patient does not have Thompson primary care physician in the area but will be given referrals for Thompson hemorrhage or physicians at the time of discharge. He will need to further titrate his medications on Thompson long-term basis. 7. Status post cadaver renal transplant: The patient is status post renal transplant and has good renal function. He is on immunosuppressive medications which have been continued throughout his hospital course. I've given him Thompson reference for [employer] and he will call them  to set up an appointment and transferred his care here to the greater  area 8. Hypertension: Blood pressure is well-controlled and his hospitalization. Code Status: Full code Family Communication: Not applicable Disposition Plan: Anticipate discharge to home in medically stable  Trevor Thompson.  [facility] Pager [phone]. If 8PM-8AM, please contact night-coverage at [email or web address], password [facility] 02/29/2012, 7:02 PM  LOS: 8 days   Brief narrative: Patient is an 51 y.o. male with with Thompson 25 year history of poorly controlled type 1 diabetes mellitus with complications of neuropathy, retinopathy and nephropathy. The patient is status post renal transplant. The patient is currently traveling in the [place] area and reports progressive pain over the past several days of the left foot. The patient reports that he lost the toenail on the left second toenail and over the course of 2 days the toe became progressively necrotic and more painful. The patient became concerned because he noticed Thompson red streak going up the left leg. The patient reports increasing pain 6/10 on the plantar surface of the left foot. The patient also reports painful toes. The patient reports progressive cold intolerance. He has also had some fluctuating blood sugars that have been extreme. The patient reports no history of previous amputation. The patient reports that he was diagnosed with type 1 diabetes mellitus at the age of 89. The patient denies fever. Hospitalization was requested for further evaluation and management  Consultants:  Vein and vascular surgery-Dr. Arbie Cookey  Procedures:  Status post transmetatarsal amputation of left second toe  Antibiotics:  Bactrim 8/19 >> 8/24, 8/26 >>  Avelox 8/19 >> 8/24  Vancomycin 8/19 >> 8/23  Levaquin 8/26 >>  HPI/Subjective: Patient states that he feels much better today.    Objective: Filed Vitals:   02/29/12 0525 02/29/12 0900 02/29/12 1400 02/29/12 1754  BP: 190/98 154/79 142/93 137/84  Pulse: 99 89 88 88  Temp: 100 F  (37.8 C) 99 F (37.2 C) 98.1 F (36.7 C) 98.5 F (36.9 C)  TempSrc: Oral Oral Oral Oral  Resp: 20 20 20 20   Height:      Weight:      SpO2: 96% 96% 96% 97%   Weight change: -2.499 kg (-5 lb 8.2 oz)  Intake/Output Summary (Last 24 hours) at 02/29/12 1902 Last data filed at 02/29/12 1800  Gross per 24 hour  Intake   1740 ml  Output   2950 ml  Net  -1210 ml    General: Alert, awake, oriented x3, in no acute distress. Well appearing. HEENT: [place]/AT PEERL, EOMI. No sinus tenderness Heart: Regular rate and rhythm Lungs: Clear to auscultation, no wheezing or rhonchi noted. No increased vocal fremitus resonant to percussion  Abdomen: Soft, nontender, nondistended, positive bowel sounds, no masses no hepatosplenomegaly noted..  Neuro: No focal neurological deficits noted Musculoskeletal: Wound site appears clean and no warmth or erythema noted on left foot.    Data Reviewed: Basic Metabolic Panel:  Lab 02/29/12 2841 02/28/12 0519 02/27/12 1130 02/26/12 1226 02/23/12 0640  NA 139 134* 135 129* 136  K 3.7 3.8 4.1 4.2 4.0  CL 102 102 100 98 103  CO2 27 24 26 21 25   GLUCOSE 84 138* 109* 110* 135*  BUN 7 11 14 13 15   CREATININE 1.11 1.34 1.43* 1.32 1.20  CALCIUM 9.9 9.1 9.5 9.3 9.3  MG -- -- -- -- --  PHOS -- -- -- -- --   Liver Function Tests: No results found for this basename: AST:5,ALT:5,ALKPHOS:5,BILITOT:5,PROT:5,ALBUMIN:5 in the last 168  hours No results found for this basename: LIPASE:5,AMYLASE:5 in the last 168 hours No results found for this basename: AMMONIA:5 in the last 168 hours CBC:  Lab 02/29/12 0854 02/27/12 1130 02/26/12 1226 02/25/12 0625 02/23/12 0640  WBC 21.1* 18.2* 16.4* 17.5* 14.2*  NEUTROABS 14.3* 13.5* 11.8* -- --  HGB 12.8* 12.5* 13.5 12.6* 13.5  HCT 38.4* 38.4* 41.5 38.6* 42.6  MCV 83.8 84.8 84.3 83.5 84.9  PLT 229 170 162 154 156   Cardiac Enzymes: No results found for this basename: CKTOTAL:5,CKMB:5,CKMBINDEX:5,TROPONINI:5 in the last 168  hours BNP (last 3 results) No results found for this basename: PROBNP:3 in the last 8760 hours CBG:  Lab 02/29/12 1639 02/29/12 1217 02/29/12 1002 02/29/12 0739 02/29/12 0304  GLUCAP 156* 109* 110* 68* 104*    Recent Results (from the past 240 hour(s))  WOUND CULTURE     Status: Normal   Collection Time   02/25/12 11:39 AM      Component Value Range Status Comment   Specimen Description WOUND LEFT FOOT   Final    Special Requests PT ON VANCO   Final    Gram Stain     Final    Value: NO WBC SEEN     NO SQUAMOUS EPITHELIAL CELLS SEEN     NO ORGANISMS SEEN   Culture NO GROWTH 2 DAYS   Final    Report Status 02/27/2012 FINAL   Final   ANAEROBIC CULTURE     Status: Normal (Preliminary result)   Collection Time   02/25/12 11:39 AM      Component Value Range Status Comment   Specimen Description WOUND LEFT FOOT   Final    Special Requests PT ON VANCO   Final    Gram Stain PENDING   Incomplete    Culture     Final    Value: NO ANAEROBES ISOLATED; CULTURE IN PROGRESS FOR 5 DAYS   Report Status PENDING   Incomplete   CULTURE, BLOOD (ROUTINE X 2)     Status: Normal (Preliminary result)   Collection Time   02/28/12 11:55 AM      Component Value Range Status Comment   Specimen Description BLOOD ARM RIGHT   Final    Special Requests BOTTLES DRAWN AEROBIC ONLY 8CC   Final    Culture  Setup Time 02/28/2012 18:32   Final    Culture     Final    Value:        BLOOD CULTURE RECEIVED NO GROWTH TO DATE CULTURE WILL BE HELD FOR 5 DAYS BEFORE ISSUING Thompson FINAL NEGATIVE REPORT   Report Status PENDING   Incomplete   CULTURE, BLOOD (ROUTINE X 2)     Status: Normal (Preliminary result)   Collection Time   02/28/12 12:00 PM      Component Value Range Status Comment   Specimen Description BLOOD HAND RIGHT   Final    Special Requests BOTTLES DRAWN AEROBIC ONLY 10CC   Final    Culture  Setup Time 02/28/2012 18:32   Final    Culture     Final    Value:        BLOOD CULTURE RECEIVED NO GROWTH TO DATE  CULTURE WILL BE HELD FOR 5 DAYS BEFORE ISSUING Thompson FINAL NEGATIVE REPORT   Report Status PENDING   Incomplete      Studies: Dg Chest Port 1 View  02/21/2012  *RADIOLOGY REPORT*  Clinical Data: Foot pain.  Renal transplant patient.  Admission.  PORTABLE  CHEST - 1 VIEW  Comparison: None.  Findings: Streaky opacity is seen in the right perihilar region, which may be due to atelectasis, scarring, or developing infiltrate.  Left lung is clear.  No evidence of pleural effusion. Heart size is within normal limits.  IMPRESSION: Right perihilar opacity, which may be due to atelectasis, scarring, or developing infiltrate.  Radiographic followup is recommended.   Original Report Authenticated By: Danae Orleans, M.D.    Dg Foot Complete Left  02/21/2012  *RADIOLOGY REPORT*  Clinical Data: Pain and necrosis of the second toe.  LEFT FOOT - COMPLETE 3+ VIEW  Comparison: None.  Findings: There is no bony abnormality.  No evidence of fracture, dislocation or osteomyelitis.  There is extensive vascular calcification throughout the region.  IMPRESSION: Extensive vascular calcification.  No bony finding.   Original Report Authenticated By: Thomasenia Sales, M.D. ( 02/21/2012 11:23:14 )     Scheduled Meds:    . docusate sodium  100 mg Oral BID  . gabapentin  300 mg Oral TID  . heparin  5,000 Units Subcutaneous Q8H  . insulin aspart  0-15 Units Subcutaneous TID WC  . insulin aspart  10 Units Subcutaneous TID WC  . insulin glargine  32 Units Subcutaneous BID  . levofloxacin  750 mg Oral Q24H  . metoprolol tartrate  25 mg Oral BID  . mycophenolate  360 mg Oral TID  . omega-3 acid ethyl esters  2 g Oral BID  . pantoprazole  40 mg Oral Q breakfast  . predniSONE  5 mg Oral Q breakfast  . saccharomyces boulardii  250 mg Oral BID  . sulfamethoxazole-trimethoprim  2 tablet Oral Q12H  . tacrolimus  4 mg Oral BID   Continuous Infusions:    . sodium chloride 100 mL/hr at 02/29/12 1127    Principal Problem:   *Fever Active Problems:  Cellulitis of left foot  Necrotic toe   ? 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

## 2012-02-29 NOTE — Progress Notes (Addendum)
[facility] [place]  Postoperative Visit - Amputation  Date of Surgery: 02/21/2012 - 02/25/2012 Procedure(s): AMPUTATION DIGIT Right Surgeon: Surgeon(s): Larina Earthly, MD POD: 4 Days Post-Op  Subjective Trevor Thompson is a 51 y.o. male who is S/P Right Procedure(s): AMPUTATION DIGIT.  Pt.denies increased pain in the stump. The patient notes pain is well controlled. Pt. denies phantom pain.  Significant Diagnostic Studies: CBC Lab Results  Component Value Date   WBC 18.2* 02/27/2012   HGB 12.5* 02/27/2012   HCT 38.4* 02/27/2012   MCV 84.8 02/27/2012   PLT 170 02/27/2012    BMET    Component Value Date/Time   NA 134* 02/28/2012 0519   K 3.8 02/28/2012 0519   CL 102 02/28/2012 0519   CO2 24 02/28/2012 0519   GLUCOSE 138* 02/28/2012 0519   BUN 11 02/28/2012 0519   CREATININE 1.34 02/28/2012 0519   CALCIUM 9.1 02/28/2012 0519   GFRNONAA 60* 02/28/2012 0519   GFRAA 69* 02/28/2012 0519    COAG Lab Results  Component Value Date   INR 1.26 02/25/2012   No results found for this basename: PTT     Intake/Output Summary (Last 24 hours) at 02/29/12 0810 Last data filed at 02/29/12 0527  Gross per 24 hour  Intake    720 ml  Output   1500 ml  Net   -780 ml   No data found.    Physical Examination  BP Readings from Last 3 Encounters:  02/29/12 190/98  02/29/12 190/98  02/29/12 190/98   Temp Readings from Last 3 Encounters:  02/29/12 100 F (37.8 C) Oral  02/29/12 100 F (37.8 C) Oral  02/29/12 100 F (37.8 C) Oral   SpO2 Readings from Last 3 Encounters:  02/29/12 96%  02/29/12 96%  02/29/12 96%   Pulse Readings from Last 3 Encounters:  02/29/12 99  02/29/12 99  02/29/12 99    Pt is A&Ox3  WDWN male with no complaints  Right amputation wound is clean, dry and healing well.  There is good bone coverage in the stump Stump is warm and well perfused, without drainage; without erythema   Assessment/plan:  Trevor Thompson is a 51 y.o. male who  is s/p Right Procedure(s): AMPUTATION DIGIT  The patient's stump is clean, dry, intact or viable.  Follow-up Sept 3erd weeks from surgery  Clinton Gallant [facility] 8:10 AM 02/29/[phone]  I have examined the patient, reviewed and agree with above.  Amai Cappiello, MD 03/01/2012 8:31 AM

## 2012-02-29 NOTE — Progress Notes (Signed)
PT Cancellation Note  Treatment cancelled today due to patient's refusal to participate.  On the phone (seemingly important conversation), pt politely declining amb;   Will follow up tomorrow;  Of note, new RW delivered to pt's room;  Van Clines [facility] 02/29/2012, 4:05 PM

## 2012-03-01 LAB — ANAEROBIC CULTURE: Gram Stain: NONE SEEN

## 2012-03-01 LAB — CBC WITH DIFFERENTIAL/PLATELET
Basophils Absolute: 0 10*3/uL (ref 0.0–0.1)
Eosinophils Absolute: 0.2 10*3/uL (ref 0.0–0.7)
Lymphocytes Relative: 10 % — ABNORMAL LOW (ref 12–46)
Lymphs Abs: 2.1 10*3/uL (ref 0.7–4.0)
MCHC: 33 g/dL (ref 30.0–36.0)
MCV: 82.7 fL (ref 78.0–100.0)
Monocytes Relative: 17 % — ABNORMAL HIGH (ref 3–12)
Neutro Abs: 14.8 10*3/uL — ABNORMAL HIGH (ref 1.7–7.7)
Platelets: 232 10*3/uL (ref 150–400)
RDW: 14.3 % (ref 11.5–15.5)
WBC: 20.6 10*3/uL — ABNORMAL HIGH (ref 4.0–10.5)

## 2012-03-01 LAB — GLUCOSE, CAPILLARY: Glucose-Capillary: 108 mg/dL — ABNORMAL HIGH (ref 70–99)

## 2012-03-01 MED ORDER — SULFAMETHOXAZOLE-TMP DS 800-160 MG PO TABS: 2.0000 | ORAL_TABLET | Freq: Two times a day (BID) | ORAL | Status: AC

## 2012-03-01 MED ORDER — LEVOFLOXACIN 750 MG PO TABS: 750.0000 mg | ORAL_TABLET | ORAL | Status: AC

## 2012-03-01 NOTE — Progress Notes (Signed)
Pt given discharge education and prescriptions. Pt verbalized understanding. IV removed. Pt waiting in room for ride to arrive.  3:39 PM 03/01/2012 Baron Hamper RN

## 2012-03-01 NOTE — Progress Notes (Signed)
PT Cancellation Note  Treatment cancelled today due to patient's refusal to participate.  Van Clines [facility] 03/01/2012, 11:18 AM

## 2012-03-01 NOTE — Progress Notes (Signed)
Left:  No evidence of DVT, superficial thrombosis, or Baker's cyst.  Right:  Negative for DVT in the common femoral vein.  

## 2012-03-01 NOTE — Progress Notes (Signed)
Nutrition Brief Note  Patient identified on the Malnutrition Screening Tool (MST) report for answering "yes" to recently losing weight without trying and eating poorly because of a decreased appetite, generating a score of 4.  Pt denies wt loss or poor appetite. Pt did note that he did not eat breakfast this morning 2/2 food preferences. States that he ate 75% of meals yesterday.   Family at bedside interested in education. Pt seems unwilling to change, denies poor eating habits that family accuses him of.  Lab Results  Component Value Date   HGBA1C 11.2* 02/21/2012   RD provided "Carbohydrate Counting for People with Diabetes" handout from the Academy of Nutrition and Dietetics. Discussed different food groups and their effects on blood sugar, emphasizing carbohydrate-containing foods. Provided list of carbohydrates and recommended serving sizes of common foods. Provided lists of meals and snacks for better blood sugar control.  Expect poor compliance.  Body mass index is 35.36 kg/(m^2). Pt meets criteria for Obesity Class I based on current BMI.   Current diet order is Carbohydrate Modified Medium, patient is consuming approximately 75-100% of meals at this time. Labs and medications reviewed.   No nutrition interventions warranted at this time. If nutrition issues arise, please consult RD.   Jarold Motto MS, RD, LDN Pager: [phone] After-hours pager: [phone]

## 2012-03-01 NOTE — Discharge Summary (Signed)
Physician Discharge Summary  [name] ZOX:[id number] DOB: 1960-09-17 DOA: 02/21/2012  PCP: Sheila Oats, MD  Admit date: 02/21/2012 Discharge date: 03/01/2012  Recommendations for Outpatient Follow-up:  1. Please see your primary care doctor within one week for repeat CBC and electrolyte panel. 2. Please follow up with Dr. Arbie Cookey 3 weeks after surgery for examination of your foot 3. Please follow up with Dr. Daiva Eves in 3 weeks to make sure your infection was completely treated by the antibiotics.  Discharge Diagnoses:  Principal Problem:  *Fever Active Problems:  Cellulitis of left foot  Necrotic toe   ? 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 male with with a 25 year history of poorly controlled type 1 diabetes mellitus with complications of neuropathy, retinopathy and nephropathy. The patient is status post renal transplant. The patient is currently traveling in the [place] area and reports progressive pain over the past several days of the left foot. The patient reports that he lost the toenail on the left second toenail and  over the course of 2 days the toe became progressively necrotic and more painful. The patient became concerned because he noticed a red streak going up the left leg. The patient reports increasing pain 6/10 on the plantar surface of the left foot. The patient also reports painful toes. The patient reports progressive cold intolerance. He has also had some fluctuating blood sugars that have been extreme. The patient reports no history of previous amputation. The patient reports that he was diagnosed with type 1 diabetes mellitus at the age of 74. The patient denies fever. Hospitalization was requested for further evaluation and management.   Hospital Course:   Gangrene:  Patient underwent arteriogram of the left foot 8/20 followed by amputation of the send toe of the same foot on 8/21.  He also had debridement of the a blister on the left fifth metatarsal head.    1. Acute kidney injury: The patient's creatinine has increased to 1.43 from admission level of 0.9. However in review of the patient's record he had a creatinine of 1.3 on 11/23/2011. The patient does not know what his baseline creatinine.  His creatinine trended down to 1.1 prior to discharge and he should have a repeat BMP by his primary care doctor in 1 week. 2. Orthostatic hypotension - autonomic: The patient demonstrated orthostatic hypotension both the symptoms and the drop in blood pressure.  These symptoms improved with gentle hydration and patient was able to walk around his room without difficulty or lightheadedness before discharge. 3. Gangrene associated with diabetes: The patient was noted to have diffuse peripheral vascular disease in a setting of gangrenous left second toe. He was evaluated by Dr. early obtain a  vascular surgery. An arteriogram was performed and the patient subsequently underwent a transmetatarsal amputation of the left second toe and debridement of a blister over the left fifth metatarsal head on 02/23/12. The  patient has been given a Darco shoe with ambulation as tolerated. The patient was evaluated by physical therapy who recommends a rolling walker and home health therapy which have been arranged. 4. Diabetes type 1 uncontrolled: The patient has a hemoglobin A1c of 11.2 which indicates poor chronic control. However him Hoss with which his blood sugars well-controlled. The patient does not have a primary care physician in the area but will be given referrals for a hemorrhage or physicians at the time of discharge. He will need to further titrate his medications on a long-term basis. 5. Status post cadaver renal transplant: The patient is status post renal transplant and has good renal function. He is on immunosuppressive medications which have been continued throughout his hospital course.  He should follow up with [facility] and he will call them to set up an appointment and transferred his care here to the greater area 6. Hypertension: Blood pressure is well-controlled and his hospitalization. 7. Leukocytosis: The patient has developed leukocytosis. Urinalysis was found to be negative. The leukocytosis may be secondary to his use of prednisone on an ongoing basis, recent stress from surgery, or occult infection.  It is now trending down slightly and patient feels clinically well.  Patietn to monitor for signs and symptoms of infection at home and continue his antibiotics today and for an additional 7 days to complete a 14 day course from time of amputation.  Patient was on antibiotics for 2 days prior to amputation also. 8. Left lower extremity edema:  Patient has had two weeks of mild calf pain with swelling of the left leg.  [place] on 8/28 was negative for bakers cyst and DVT and superficial thrombophlebitis.   Procedures:  Arteriogram left foot 8/20  transmetatarsal amputation of the left second toe and debridement of a blister over the left fifth metatarsal head on  02/23/12.  Consultations:  Infectious disease  Vascular surgery  Physical therapy  Discharge Exam: Filed Vitals:   03/01/12 1021  BP: 116/82  Pulse:   Temp:   Resp:    Filed Vitals:   02/29/12 2122 03/01/12 0503 03/01/12 0850 03/01/12 1021  BP: 162/94 152/85 105/67 116/82  Pulse: 98 100 82   Temp: 98.3 F (36.8 C) 99.7 F (37.6 C) 98.3 F (36.8 C)   TempSrc: Oral Oral Oral   Resp: 20 20 20    Height:      Weight: 115 kg (253 lb 8.5 oz)     SpO2: 96% 91% 94%     General:  AAM, no acute distress HEENT:  PERRL, anicteric, noninjected, MMM Cardiovascular:  RRR, 3/6 holosystolic murmur at the apex and LSB Respiratory: CTAB Abd:  Sfot, nondistended,d nontender, no organomegaly Ext:  Left lower extremity with 1+ slow pitting edema to the knee, 2+ pulses.  Left 2nd toe space most ulcer with healthy granulation tissue.  Lateral left foot with dry scab about 1cm.  Both covered with dry dressings which were replaced.    Discharge Instructions  Discharge Orders    Future Appointments: Provider: Department: Dept Phone: Center:   03/07/2012 1:30 PM Larina Earthly, MD [facility] [phone] [facility]     Future Orders Please Complete By Expires   Diet - low sodium heart healthy      Increase activity slowly  Discharge instructions      Comments:   You were hospitalized with fever and high white blood cell count.  You were found to have a gangrenous toe and a callous on your left foot which were surgically debrided.  Because of concern for infection despite the amputation of your second toe, you were seen by infectious disease consultants who recommended completing a course of antibiotics.  Your white blood cell count is still elevated, but trending down and your fever has resolved.  You had some swelling of your left leg and some calf tenderness, but your ultrasound of your leg showed no clots or cysts.   Change dressing (specify)      Comments:   Dressing change: 1 times per day  using sterile gauze and gently wrapping in bandage until you follow up with the surgeons.  Keep area clean and dry.  Rinse with saline as needed.   Call MD for:  temperature >100.4      Call MD for:  persistant nausea and vomiting      Call MD for:  severe uncontrolled pain      Call MD for:  redness, tenderness, or signs of infection (pain, swelling, redness, odor or green/yellow discharge around incision site)      Call MD for:  difficulty breathing, headache or visual disturbances      Call MD for:  hives      Call MD for:  persistant dizziness or light-headedness      Call MD for:  extreme fatigue        Medication List  As of 03/01/2012  2:09 PM   TAKE these medications         esomeprazole 40 MG capsule   Commonly known as: NEXIUM   Take 40 mg by mouth daily before breakfast.      gabapentin 300 MG capsule   Commonly known as: NEURONTIN   Take 1 capsule (300 mg total) by mouth 3 (three) times daily.      HYDROcodone-acetaminophen 5-325 MG per tablet   Commonly known as: NORCO/VICODIN   Take 1-2 tablets by mouth every 4 (four) hours as needed.      insulin aspart 100 UNIT/ML injection   Commonly known as: novoLOG   Inject 12 Units into the skin 3 (three) times daily before meals.      insulin glargine 100 UNIT/ML injection   Commonly known as: LANTUS   Inject 32 Units into the skin 2 (two) times daily.      levofloxacin 750 MG tablet   Commonly known as: LEVAQUIN   Take 1 tablet (750 mg total) by mouth daily.      metoprolol tartrate 25 MG tablet   Commonly known as: LOPRESSOR   Take 25 mg by mouth 2 (two) times daily.      mycophenolate 360 MG Tbec   Commonly known as: MYFORTIC   Take 360 mg by mouth 3 (three) times daily.      omega-3 acid ethyl esters 1 G capsule   Commonly known as: LOVAZA   Take 2 g by mouth 2 (two) times daily.      predniSONE 5 MG tablet   Commonly known as: DELTASONE   Take 5 mg by mouth daily.      sulfamethoxazole-trimethoprim  800-160 MG per tablet   Commonly known as: BACTRIM DS   Take 2 tablets by mouth every 12 (twelve) hours.      tacrolimus 1 MG capsule   Commonly  known as: PROGRAF   Take 4 mg by mouth 2 (two) times daily.           Follow-up Information    Follow up with [facility]. (Please contact [facility] to assist with locating a Primary Care Physician. )    Contact information:   [phone]      Follow up with EARLY, TODD, MD. (has f/u in sept 3erd)    Contact information:   [address] [place] [address] [phone]       Follow up with Acey Lav, MD. Schedule an appointment as soon as possible for a visit in 3 weeks.   Contact information:   [address] [place] [address] [phone]       Follow up with DEFAULT,PROVIDER, MD. Schedule an appointment as soon as possible for a visit in 1 week.   Contact information:   [address] [place] [address] [phone]           The results of significant diagnostics from this hospitalization (including imaging, microbiology, ancillary and laboratory) are listed below for reference.    Significant Diagnostic Studies: Dg Chest 2 View  02/28/2012  *RADIOLOGY REPORT*  Clinical Data: 51 year old male with shortness of breath tachycardia weakness.  Surgery 2 days ago.  CHEST - 2 VIEW  Comparison: Preoperative study 02/21/2012.  Findings: Lower lung volumes.  Crowding of markings.  Vascular congestion may be mildly increased there is no overt edema. Probable scarring in the right midlung appears unchanged.  No pleural effusion or pneumothorax.  Stable cardiac size and mediastinal contours.  Visualized tracheal air column is within normal limits.  No acute osseous abnormality identified.  IMPRESSION: Lower lung volumes with atelectasis and perhaps mild vascular congestion but no edema.  Right mid lung scarring.   Original Report Authenticated By: Harley Hallmark, M.D.    Ct Head Wo Contrast  02/28/2012  *RADIOLOGY REPORT*  Clinical Data: Headache  CT HEAD WITHOUT CONTRAST  Technique:  Contiguous axial images were obtained from the base of the skull through the vertex without contrast.  Comparison: None.  Findings: Ventricle size is normal.  Negative for acute infarct. Negative for hemorrhage or mass. 7 mm   low density cyst in the right basal ganglia appears to represent a perivascular space.  Skull is intact.  Mild chronic sinusitis.  Calcification in the carotid  and vertebral arteries compatible with diabetes.  IMPRESSION: No acute intracranial abnormality.   Original Report Authenticated By: Camelia Phenes, M.D.    Dg Chest Port 1 View  02/21/2012  *RADIOLOGY REPORT*  Clinical Data: Foot pain.  Renal transplant patient.  Admission.  PORTABLE CHEST - 1 VIEW  Comparison: None.  Findings: Streaky opacity is seen in the right perihilar region, which may be due to atelectasis, scarring, or developing infiltrate.  Left lung is clear.  No evidence of pleural effusion. Heart size is within normal limits.  IMPRESSION: Right perihilar opacity, which may be due to atelectasis, scarring, or developing infiltrate.  Radiographic followup is recommended.   Original Report Authenticated By: Danae Orleans, M.D.    Dg Foot Complete Left  02/21/2012  *RADIOLOGY REPORT*  Clinical Data: Pain and necrosis of the second toe.  LEFT FOOT - COMPLETE 3+ VIEW  Comparison: None.  Findings: There is no bony abnormality.  No evidence of fracture, dislocation or osteomyelitis.  There is extensive vascular calcification throughout the region.  IMPRESSION:  Extensive vascular calcification.  No bony finding.   Original Report Authenticated By: Thomasenia Sales, M.D. ( 02/21/2012 11:23:14 )     Microbiology: Recent Results (from the past 240 hour(s))  WOUND CULTURE     Status: Normal   Collection Time   02/25/12 11:39 AM      Component Value Range Status Comment   Specimen  Description WOUND LEFT FOOT   Final    Special Requests PT ON VANCO   Final    Gram Stain     Final    Value: NO WBC SEEN     NO SQUAMOUS EPITHELIAL CELLS SEEN     NO ORGANISMS SEEN   Culture NO GROWTH 2 DAYS   Final    Report Status 02/27/2012 FINAL   Final   ANAEROBIC CULTURE     Status: Normal   Collection Time   02/25/12 11:39 AM      Component Value Range Status Comment   Specimen Description WOUND LEFT FOOT   Final    Special Requests PT ON VANCO   Final    Gram Stain     Final    Value: NO WBC SEEN     NO SQUAMOUS EPITHELIAL CELLS SEEN     NO ORGANISMS SEEN   Culture NO ANAEROBES ISOLATED   Final    Report Status 03/01/2012 FINAL   Final   CULTURE, BLOOD (ROUTINE X 2)     Status: Normal (Preliminary result)   Collection Time   02/28/12 11:55 AM      Component Value Range Status Comment   Specimen Description BLOOD ARM RIGHT   Final    Special Requests BOTTLES DRAWN AEROBIC ONLY 8CC   Final    Culture  Setup Time 02/28/2012 18:32   Final    Culture     Final    Value:        BLOOD CULTURE RECEIVED NO GROWTH TO DATE CULTURE WILL BE HELD FOR 5 DAYS BEFORE ISSUING A FINAL NEGATIVE REPORT   Report Status PENDING   Incomplete   CULTURE, BLOOD (ROUTINE X 2)     Status: Normal (Preliminary result)   Collection Time   02/28/12 12:00 PM      Component Value Range Status Comment   Specimen Description BLOOD HAND RIGHT   Final    Special Requests BOTTLES DRAWN AEROBIC ONLY 10CC   Final    Culture  Setup Time 02/28/2012 18:32   Final    Culture     Final    Value:        BLOOD CULTURE RECEIVED NO GROWTH TO DATE CULTURE WILL BE HELD FOR 5 DAYS BEFORE ISSUING A FINAL NEGATIVE REPORT   Report Status PENDING   Incomplete      Labs: Basic Metabolic Panel:  Lab 02/29/12 1478 02/28/12 0519 02/27/12 1130 02/26/12 1226  NA 139 134* 135 129*  K 3.7 3.8 4.1 4.2  CL 102 102 100 98  CO2 27 24 26 21   GLUCOSE 84 138* 109* 110*  BUN 7 11 14 13   CREATININE 1.11 1.34 1.43* 1.32  CALCIUM 9.9  9.1 9.5 9.3  MG -- -- -- --  PHOS -- -- -- --   Liver Function Tests: No results found for this basename: AST:5,ALT:5,ALKPHOS:5,BILITOT:5,PROT:5,ALBUMIN:5 in the last 168 hours No results found for this basename: LIPASE:5,AMYLASE:5 in the last 168 hours No results found for this basename: AMMONIA:5 in the last 168 hours CBC:  Lab 03/01/12 0535 02/29/12 0854  02/27/12 1130 02/26/12 1226 02/25/12 0625  WBC 20.6* 21.1* 18.2* 16.4* 17.5*  NEUTROABS 14.8* 14.3* 13.5* 11.8* --  HGB 12.3* 12.8* 12.5* 13.5 12.6*  HCT 37.3* 38.4* 38.4* 41.5 38.6*  MCV 82.7 83.8 84.8 84.3 83.5  PLT 232 229 170 162 154   Cardiac Enzymes: No results found for this basename: CKTOTAL:5,CKMB:5,CKMBINDEX:5,TROPONINI:5 in the last 168 hours BNP: BNP (last 3 results) No results found for this basename: PROBNP:3 in the last 8760 hours CBG:  Lab 03/01/12 0739 03/01/12 0547 03/01/12 0242 02/29/12 2126 02/29/12 1639  GLUCAP 111* 116* 108* 71 156*    Time coordinating discharge: 45 minutes  Signed:  Shir Bergman  [facility] 03/01/2012, 2:09 PM

## 2012-03-02 ENCOUNTER — Encounter (HOSPITAL_COMMUNITY): Payer: Self-pay

## 2012-03-02 DIAGNOSIS — R609 Edema, unspecified: Secondary | ICD-10-CM

## 2012-03-02 DIAGNOSIS — M79609 Pain in unspecified limb: Secondary | ICD-10-CM

## 2012-03-03 ENCOUNTER — Encounter: Payer: Self-pay | Admitting: Vascular Surgery

## 2012-03-05 LAB — CULTURE, BLOOD (ROUTINE X 2): Culture: NO GROWTH

## 2012-03-07 ENCOUNTER — Encounter: Payer: Self-pay | Admitting: Vascular Surgery

## 2012-03-07 ENCOUNTER — Ambulatory Visit (INDEPENDENT_AMBULATORY_CARE_PROVIDER_SITE_OTHER): Payer: Medicare Other | Admitting: Vascular Surgery

## 2012-03-07 VITALS — BP 113/71 | HR 94 | Resp 20 | Ht 71.0 in | Wt 250.0 lb

## 2012-03-07 DIAGNOSIS — I739 Peripheral vascular disease, unspecified: Secondary | ICD-10-CM

## 2012-03-07 NOTE — Progress Notes (Signed)
The patient is here today for followup of his left foot. I seen him during his recent hospitalization at [facility]. He presented with dry gangrene of his left second toe and ulceration and blister over the lateral aspect of his fifth metatarsal head. He underwent arteriography which revealed in line flow in the anterior tibial and posterior tibial into the foot with small vessel disease. He underwent a left second toe amputation and debridement of the area of blister on the fifth metatarsal head. He had very poor bleeding at the time of his second toe ray amputation. I discussed this with him before and after the education and I was concerned without ability for reconstruction improvement of arterial flow that he may have a progressive tissue loss in the upper thigh her level of amputation.  He has no significant pain associated with this. He has been walking on a Darco shoe. He now has developed similar ischemic changes to his left third toe with what looks like we'll progress to dry gangrene. He has very poor healing at the second toe amputation with some blistering at this area. The area of the lateral aspect of the submetatarsal head appears to be healing satisfactorily. Of more concern he does have some darkening in the arch of his foot which was not present in hospital and I feel he may be tracking into his arch.  I discussed his case by telephone with Dr. Lajoyce Corners. I asked Dr. Lajoyce Corners to look at his foot while he was in the holding area on the way back for his initial amputation. I am going to seek a formal consult from Dr. Lajoyce Corners today and he is kindly have offered to see him in the office. I explained to the patient and his family that I feel he is at high risk for needing a transmetatarsal amputation with significant concern regarding healing of this and that he is certainly at rest in the with a below-knee amputation. He'll see Dr. Lajoyce Corners today for further recommendations

## 2012-03-13 ENCOUNTER — Other Ambulatory Visit (HOSPITAL_COMMUNITY): Payer: Self-pay | Admitting: Orthopedic Surgery

## 2012-03-14 ENCOUNTER — Encounter (HOSPITAL_COMMUNITY): Payer: Self-pay | Admitting: Respiratory Therapy

## 2012-03-20 ENCOUNTER — Encounter (HOSPITAL_COMMUNITY): Payer: Self-pay

## 2012-03-20 ENCOUNTER — Ambulatory Visit (HOSPITAL_COMMUNITY)
Admission: RE | Admit: 2012-03-20 | Discharge: 2012-03-20 | Disposition: A | Discharge status: Home / Self Care | Payer: Medicare Other | Source: Ambulatory Visit | Attending: Orthopedic Surgery | Admitting: Orthopedic Surgery

## 2012-03-20 ENCOUNTER — Encounter (HOSPITAL_COMMUNITY)
Admission: RE | Admit: 2012-03-20 | Discharge: 2012-03-20 | Disposition: A | Discharge status: Home / Self Care | Payer: Medicare Other | Source: Ambulatory Visit | Attending: Orthopedic Surgery | Admitting: Orthopedic Surgery

## 2012-03-20 DIAGNOSIS — Z01818 Encounter for other preprocedural examination: Secondary | ICD-10-CM | POA: Insufficient documentation

## 2012-03-20 DIAGNOSIS — Z01810 Encounter for preprocedural cardiovascular examination: Secondary | ICD-10-CM | POA: Insufficient documentation

## 2012-03-20 DIAGNOSIS — Z01812 Encounter for preprocedural laboratory examination: Secondary | ICD-10-CM | POA: Insufficient documentation

## 2012-03-20 LAB — APTT: aPTT: 35 seconds (ref 24–37)

## 2012-03-20 LAB — PROTIME-INR: Prothrombin Time: 13.9 seconds (ref 11.6–15.2)

## 2012-03-20 LAB — COMPREHENSIVE METABOLIC PANEL
ALT: 15 U/L (ref 0–53)
Albumin: 2.4 g/dL — ABNORMAL LOW (ref 3.5–5.2)
Alkaline Phosphatase: 115 U/L (ref 39–117)
BUN: 28 mg/dL — ABNORMAL HIGH (ref 6–23)
Chloride: 107 mEq/L (ref 96–112)
Potassium: 4.2 mEq/L (ref 3.5–5.1)
Sodium: 140 mEq/L (ref 135–145)
Total Bilirubin: 0.5 mg/dL (ref 0.3–1.2)
Total Protein: 8.4 g/dL — ABNORMAL HIGH (ref 6.0–8.3)

## 2012-03-20 LAB — CBC
HCT: 43 % (ref 39.0–52.0)
Hemoglobin: 13.8 g/dL (ref 13.0–17.0)
RDW: 14.8 % (ref 11.5–15.5)
WBC: 13.3 10*3/uL — ABNORMAL HIGH (ref 4.0–10.5)

## 2012-03-20 LAB — SURGICAL PCR SCREEN: Staphylococcus aureus: NEGATIVE

## 2012-03-20 MED ORDER — CLINDAMYCIN PHOSPHATE 900 MG/50ML IV SOLN: 900.0000 mg | INTRAVENOUS | Status: DC

## 2012-03-20 NOTE — Pre-Procedure Instructions (Signed)
20 Trevor Thompson  03/20/2012   Your procedure is scheduled on:  Wednesday March 22, 2012  Report to [facility] Stay [facility] at 7:00 AM.  Call this number if you have problems the morning of surgery: [phone]   Remember:   Do not eat food or drink:After Midnight.      Take these medicines the morning of surgery with A SIP OF WATER: nexium, gabapentin, metoprolol, myfortic, prograf, prednisone   Do not wear jewelry, make-up or nail polish.  Do not wear lotions, powders, or perfumes. You may wear deodorant.  Do not shave 48 hours prior to surgery. Men may shave face and neck.  Do not bring valuables to the hospital.  Contacts, dentures or bridgework may not be worn into surgery.  Leave suitcase in the car. After surgery it may be brought to your room.  For patients admitted to the hospital, checkout time is 11:00 AM the day of discharge.   Patients discharged the day of surgery will not be allowed to drive home.  Name and phone number of your driver: family / friend  Special Instructions: CHG Shower Use Special Wash: 1/2 bottle night before surgery and 1/2 bottle morning of surgery.   Please read over the following fact sheets that you were given: Pain Booklet, Coughing and Deep Breathing, MRSA Information and Surgical Site Infection Prevention

## 2012-03-21 NOTE — Progress Notes (Signed)
Patient notified of time change and to be here instead at 8:30. Stated understanding.

## 2012-03-22 ENCOUNTER — Encounter (HOSPITAL_COMMUNITY): Payer: Self-pay | Admitting: Certified Registered"

## 2012-03-22 ENCOUNTER — Encounter (HOSPITAL_COMMUNITY)
Admission: RE | Disposition: A | Discharge status: Rehabilitation Facility | Payer: Self-pay | Source: Ambulatory Visit | Attending: Orthopedic Surgery

## 2012-03-22 ENCOUNTER — Inpatient Hospital Stay (HOSPITAL_COMMUNITY)
Admission: RE | Admit: 2012-03-22 | Discharge: 2012-03-24 | DRG: 240 | Disposition: A | Discharge status: Rehabilitation Facility | Payer: Medicare Other | Source: Ambulatory Visit | Attending: Orthopedic Surgery | Admitting: Orthopedic Surgery

## 2012-03-22 ENCOUNTER — Inpatient Hospital Stay (HOSPITAL_COMMUNITY): Payer: Medicare Other | Admitting: Certified Registered"

## 2012-03-22 DIAGNOSIS — E11319 Type 2 diabetes mellitus with unspecified diabetic retinopathy without macular edema: Secondary | ICD-10-CM | POA: Diagnosis present

## 2012-03-22 DIAGNOSIS — I70269 Atherosclerosis of native arteries of extremities with gangrene, unspecified extremity: Principal | ICD-10-CM | POA: Diagnosis present

## 2012-03-22 DIAGNOSIS — S98139A Complete traumatic amputation of one unspecified lesser toe, initial encounter: Secondary | ICD-10-CM

## 2012-03-22 DIAGNOSIS — Z833 Family history of diabetes mellitus: Secondary | ICD-10-CM

## 2012-03-22 DIAGNOSIS — F17200 Nicotine dependence, unspecified, uncomplicated: Secondary | ICD-10-CM | POA: Diagnosis present

## 2012-03-22 DIAGNOSIS — Z940 Kidney transplant status: Secondary | ICD-10-CM

## 2012-03-22 DIAGNOSIS — I10 Essential (primary) hypertension: Secondary | ICD-10-CM | POA: Diagnosis present

## 2012-03-22 DIAGNOSIS — E1039 Type 1 diabetes mellitus with other diabetic ophthalmic complication: Secondary | ICD-10-CM | POA: Diagnosis present

## 2012-03-22 HISTORY — PX: AMPUTATION: SHX166

## 2012-03-22 LAB — GLUCOSE, CAPILLARY
Glucose-Capillary: 170 mg/dL — ABNORMAL HIGH (ref 70–99)
Glucose-Capillary: 170 mg/dL — ABNORMAL HIGH (ref 70–99)
Glucose-Capillary: 101 mg/dL — ABNORMAL HIGH (ref 70–99)

## 2012-03-22 SURGERY — AMPUTATION BELOW KNEE
Anesthesia: General | Site: Leg Lower | Laterality: Left | Wound class: Clean

## 2012-03-22 MED ORDER — METHOCARBAMOL 500 MG PO TABS
500.0000 mg | ORAL_TABLET | Freq: Four times a day (QID) | ORAL | Status: DC | PRN
  Administered 2012-03-23 – 2012-03-24 (×5): 500 mg via ORAL
  Filled 2012-03-22 (×5): qty 1

## 2012-03-22 MED ORDER — HYDROMORPHONE HCL PF 1 MG/ML IJ SOLN
0.5000 mg | INTRAMUSCULAR | Status: DC | PRN
  Administered 2012-03-22 (×2): 1 mg via INTRAVENOUS
  Filled 2012-03-22 (×2): qty 1

## 2012-03-22 MED ORDER — ROCURONIUM BROMIDE 100 MG/10ML IV SOLN
INTRAVENOUS | Status: DC | PRN
  Administered 2012-03-22: 20 mg via INTRAVENOUS

## 2012-03-22 MED ORDER — GABAPENTIN 300 MG PO CAPS
300.0000 mg | ORAL_CAPSULE | Freq: Three times a day (TID) | ORAL | Status: DC
  Administered 2012-03-22: 300 mg via ORAL
  Filled 2012-03-22 (×6): qty 1

## 2012-03-22 MED ORDER — METOCLOPRAMIDE HCL 5 MG/ML IJ SOLN: 5.0000 mg | Freq: Three times a day (TID) | INTRAMUSCULAR | Status: DC | PRN

## 2012-03-22 MED ORDER — LACTATED RINGERS IV SOLN
INTRAVENOUS | Status: DC | PRN
  Administered 2012-03-22: 10:00:00 via INTRAVENOUS

## 2012-03-22 MED ORDER — CLINDAMYCIN PHOSPHATE 900 MG/50ML IV SOLN
900.0000 mg | Freq: Once | INTRAVENOUS | Status: AC
  Administered 2012-03-22: 900 mg via INTRAVENOUS
  Filled 2012-03-22: qty 50

## 2012-03-22 MED ORDER — INSULIN GLARGINE 100 UNIT/ML ~~LOC~~ SOLN
32.0000 [IU] | Freq: Two times a day (BID) | SUBCUTANEOUS | Status: DC
  Administered 2012-03-22 – 2012-03-24 (×3): 32 [IU] via SUBCUTANEOUS

## 2012-03-22 MED ORDER — WARFARIN - PHARMACIST DOSING INPATIENT: Freq: Every day | Status: DC

## 2012-03-22 MED ORDER — SUCCINYLCHOLINE CHLORIDE 20 MG/ML IJ SOLN
INTRAMUSCULAR | Status: DC | PRN
  Administered 2012-03-22: 100 mg via INTRAVENOUS

## 2012-03-22 MED ORDER — FENTANYL CITRATE 0.05 MG/ML IJ SOLN
INTRAMUSCULAR | Status: DC | PRN
  Administered 2012-03-22: 150 ug via INTRAVENOUS
  Administered 2012-03-22: 50 ug via INTRAVENOUS

## 2012-03-22 MED ORDER — MORPHINE SULFATE 2 MG/ML IJ SOLN
2.0000 mg | INTRAMUSCULAR | Status: DC | PRN
  Administered 2012-03-22 – 2012-03-24 (×10): 2 mg via INTRAVENOUS
  Filled 2012-03-22 (×10): qty 1

## 2012-03-22 MED ORDER — HYDROMORPHONE HCL PF 1 MG/ML IJ SOLN
INTRAMUSCULAR | Status: AC
  Filled 2012-03-22: qty 1

## 2012-03-22 MED ORDER — PHENYLEPHRINE HCL 10 MG/ML IJ SOLN
INTRAMUSCULAR | Status: DC | PRN
  Administered 2012-03-22 (×4): 40 ug via INTRAVENOUS

## 2012-03-22 MED ORDER — GLYCOPYRROLATE 0.2 MG/ML IJ SOLN
INTRAMUSCULAR | Status: DC | PRN
  Administered 2012-03-22: 0.4 mg via INTRAVENOUS

## 2012-03-22 MED ORDER — OMEGA-3-ACID ETHYL ESTERS 1 G PO CAPS
2.0000 g | ORAL_CAPSULE | Freq: Two times a day (BID) | ORAL | Status: DC
  Administered 2012-03-22 – 2012-03-24 (×4): 2 g via ORAL
  Filled 2012-03-22 (×6): qty 2

## 2012-03-22 MED ORDER — MYCOPHENOLATE SODIUM 180 MG PO TBEC
360.0000 mg | DELAYED_RELEASE_TABLET | Freq: Two times a day (BID) | ORAL | Status: DC
  Administered 2012-03-22 – 2012-03-24 (×4): 360 mg via ORAL
  Filled 2012-03-22 (×6): qty 2

## 2012-03-22 MED ORDER — INSULIN ASPART 100 UNIT/ML ~~LOC~~ SOLN
4.0000 [IU] | Freq: Three times a day (TID) | SUBCUTANEOUS | Status: DC
  Administered 2012-03-22: 4 [IU] via SUBCUTANEOUS
  Administered 2012-03-23: 2 [IU] via SUBCUTANEOUS
  Administered 2012-03-23: 4 [IU] via SUBCUTANEOUS

## 2012-03-22 MED ORDER — INFLUENZA VIRUS VACC SPLIT PF IM SUSP
0.5000 mL | INTRAMUSCULAR | Status: AC
  Administered 2012-03-23: 0.5 mL via INTRAMUSCULAR
  Filled 2012-03-22: qty 0.5

## 2012-03-22 MED ORDER — PNEUMOCOCCAL VAC POLYVALENT 25 MCG/0.5ML IJ INJ
0.5000 mL | INJECTION | INTRAMUSCULAR | Status: AC
  Administered 2012-03-23: 0.5 mL via INTRAMUSCULAR
  Filled 2012-03-22: qty 0.5

## 2012-03-22 MED ORDER — METHOCARBAMOL 100 MG/ML IJ SOLN
500.0000 mg | Freq: Four times a day (QID) | INTRAVENOUS | Status: DC | PRN
  Filled 2012-03-22: qty 5

## 2012-03-22 MED ORDER — ATORVASTATIN CALCIUM 10 MG PO TABS
10.0000 mg | ORAL_TABLET | Freq: Every day | ORAL | Status: DC
  Administered 2012-03-22 – 2012-03-23 (×2): 10 mg via ORAL
  Filled 2012-03-22 (×3): qty 1

## 2012-03-22 MED ORDER — ONDANSETRON HCL 4 MG/2ML IJ SOLN: 4.0000 mg | Freq: Once | INTRAMUSCULAR | Status: DC | PRN

## 2012-03-22 MED ORDER — NEOSTIGMINE METHYLSULFATE 1 MG/ML IJ SOLN
INTRAMUSCULAR | Status: DC | PRN
  Administered 2012-03-22: 3 mg via INTRAVENOUS

## 2012-03-22 MED ORDER — OXYCODONE-ACETAMINOPHEN 5-325 MG PO TABS
1.0000 | ORAL_TABLET | ORAL | Status: DC | PRN
  Administered 2012-03-22 – 2012-03-24 (×9): 2 via ORAL
  Filled 2012-03-22 (×9): qty 2

## 2012-03-22 MED ORDER — PANTOPRAZOLE SODIUM 40 MG PO TBEC
40.0000 mg | DELAYED_RELEASE_TABLET | Freq: Every day | ORAL | Status: DC
  Administered 2012-03-23 – 2012-03-24 (×2): 40 mg via ORAL
  Filled 2012-03-22 (×2): qty 1

## 2012-03-22 MED ORDER — PREDNISONE 5 MG PO TABS
5.0000 mg | ORAL_TABLET | Freq: Every day | ORAL | Status: DC
  Administered 2012-03-23 – 2012-03-24 (×2): 5 mg via ORAL
  Filled 2012-03-22 (×3): qty 1

## 2012-03-22 MED ORDER — HYDROMORPHONE HCL PF 1 MG/ML IJ SOLN
0.2500 mg | INTRAMUSCULAR | Status: DC | PRN
  Administered 2012-03-22 (×4): 0.5 mg via INTRAVENOUS

## 2012-03-22 MED ORDER — PROPOFOL 10 MG/ML IV BOLUS
INTRAVENOUS | Status: DC | PRN
  Administered 2012-03-22: 150 mg via INTRAVENOUS

## 2012-03-22 MED ORDER — CLINDAMYCIN PHOSPHATE 600 MG/50ML IV SOLN
600.0000 mg | Freq: Four times a day (QID) | INTRAVENOUS | Status: AC
  Administered 2012-03-22 – 2012-03-23 (×3): 600 mg via INTRAVENOUS
  Filled 2012-03-22 (×3): qty 50

## 2012-03-22 MED ORDER — METOCLOPRAMIDE HCL 10 MG PO TABS: 5.0000 mg | ORAL_TABLET | Freq: Three times a day (TID) | ORAL | Status: DC | PRN

## 2012-03-22 MED ORDER — ONDANSETRON HCL 4 MG/2ML IJ SOLN: 4.0000 mg | Freq: Four times a day (QID) | INTRAMUSCULAR | Status: DC | PRN

## 2012-03-22 MED ORDER — WARFARIN SODIUM 2.5 MG PO TABS
2.5000 mg | ORAL_TABLET | Freq: Once | ORAL | Status: AC
  Administered 2012-03-22: 2.5 mg via ORAL
  Filled 2012-03-22: qty 1

## 2012-03-22 MED ORDER — TACROLIMUS 1 MG PO CAPS
4.0000 mg | ORAL_CAPSULE | Freq: Two times a day (BID) | ORAL | Status: DC
  Administered 2012-03-22 – 2012-03-24 (×4): 4 mg via ORAL
  Filled 2012-03-22 (×6): qty 4

## 2012-03-22 MED ORDER — METOPROLOL TARTRATE 25 MG PO TABS
25.0000 mg | ORAL_TABLET | Freq: Two times a day (BID) | ORAL | Status: DC
  Administered 2012-03-22 – 2012-03-24 (×4): 25 mg via ORAL
  Filled 2012-03-22 (×6): qty 1

## 2012-03-22 MED ORDER — DIPHENHYDRAMINE HCL 25 MG PO CAPS
25.0000 mg | ORAL_CAPSULE | ORAL | Status: DC | PRN
  Administered 2012-03-22 – 2012-03-23 (×2): 25 mg via ORAL
  Filled 2012-03-22 (×3): qty 1

## 2012-03-22 MED ORDER — SODIUM CHLORIDE 0.9 % IV SOLN
INTRAVENOUS | Status: DC
  Administered 2012-03-23: 20 mL/h via INTRAVENOUS

## 2012-03-22 MED ORDER — ONDANSETRON HCL 4 MG PO TABS: 4.0000 mg | ORAL_TABLET | Freq: Four times a day (QID) | ORAL | Status: DC | PRN

## 2012-03-22 MED ORDER — INSULIN ASPART 100 UNIT/ML ~~LOC~~ SOLN
0.0000 [IU] | Freq: Three times a day (TID) | SUBCUTANEOUS | Status: DC
  Administered 2012-03-22 – 2012-03-23 (×2): 2 [IU] via SUBCUTANEOUS

## 2012-03-22 MED ORDER — HYDROCODONE-ACETAMINOPHEN 5-325 MG PO TABS: 1.0000 | ORAL_TABLET | ORAL | Status: DC | PRN

## 2012-03-22 SURGICAL SUPPLY — 44 items
BANDAGE ESMARK 6X9 LF (GAUZE/BANDAGES/DRESSINGS) ×1 IMPLANT
BANDAGE GAUZE ELAST BULKY 4 IN (GAUZE/BANDAGES/DRESSINGS) ×2 IMPLANT
BLADE SAW RECIP 87.9 MT (BLADE) ×2 IMPLANT
BLADE SURG 21 STRL SS (BLADE) ×2 IMPLANT
BNDG COHESIVE 6X5 TAN STRL LF (GAUZE/BANDAGES/DRESSINGS) ×2 IMPLANT
BNDG ESMARK 6X9 LF (GAUZE/BANDAGES/DRESSINGS) ×2
CLOTH BEACON ORANGE TIMEOUT ST (SAFETY) ×2 IMPLANT
COVER SURGICAL LIGHT HANDLE (MISCELLANEOUS) ×2 IMPLANT
CUFF TOURNIQUET SINGLE 34IN LL (TOURNIQUET CUFF) IMPLANT
CUFF TOURNIQUET SINGLE 44IN (TOURNIQUET CUFF) IMPLANT
DRAIN PENROSE 1/2X12 LTX STRL (WOUND CARE) IMPLANT
DRAPE EXTREMITY T 121X128X90 (DRAPE) ×2 IMPLANT
DRAPE PROXIMA HALF (DRAPES) ×4 IMPLANT
DRAPE U-SHAPE 47X51 STRL (DRAPES) ×4 IMPLANT
DRSG ADAPTIC 3X8 NADH LF (GAUZE/BANDAGES/DRESSINGS) ×2 IMPLANT
DRSG PAD ABDOMINAL 8X10 ST (GAUZE/BANDAGES/DRESSINGS) ×2 IMPLANT
DURAPREP 26ML APPLICATOR (WOUND CARE) ×2 IMPLANT
ELECT REM PT RETURN 9FT ADLT (ELECTROSURGICAL) ×2
ELECTRODE REM PT RTRN 9FT ADLT (ELECTROSURGICAL) ×1 IMPLANT
EVACUATOR 1/8 PVC DRAIN (DRAIN) IMPLANT
GLOVE BIOGEL PI IND STRL 9 (GLOVE) ×1 IMPLANT
GLOVE BIOGEL PI INDICATOR 9 (GLOVE) ×1
GLOVE SURG ORTHO 9.0 STRL STRW (GLOVE) ×2 IMPLANT
GOWN PREVENTION PLUS XLARGE (GOWN DISPOSABLE) ×2 IMPLANT
GOWN SRG XL XLNG 56XLVL 4 (GOWN DISPOSABLE) ×1 IMPLANT
GOWN STRL NON-REIN XL XLG LVL4 (GOWN DISPOSABLE) ×1
KIT BASIN OR (CUSTOM PROCEDURE TRAY) ×2 IMPLANT
KIT ROOM TURNOVER OR (KITS) ×2 IMPLANT
MANIFOLD NEPTUNE II (INSTRUMENTS) ×2 IMPLANT
NS IRRIG 1000ML POUR BTL (IV SOLUTION) ×2 IMPLANT
PACK GENERAL/GYN (CUSTOM PROCEDURE TRAY) ×2 IMPLANT
PAD ARMBOARD 7.5X6 YLW CONV (MISCELLANEOUS) ×4 IMPLANT
SPONGE GAUZE 4X4 12PLY (GAUZE/BANDAGES/DRESSINGS) ×2 IMPLANT
SPONGE LAP 18X18 X RAY DECT (DISPOSABLE) IMPLANT
STAPLER VISISTAT 35W (STAPLE) ×2 IMPLANT
STOCKINETTE IMPERVIOUS LG (DRAPES) ×2 IMPLANT
SUT PDS AB 1 CT  36 (SUTURE) ×2
SUT PDS AB 1 CT 36 (SUTURE) ×2 IMPLANT
SUT SILK 2 0 (SUTURE) ×1
SUT SILK 2-0 18XBRD TIE 12 (SUTURE) ×1 IMPLANT
TOWEL OR 17X24 6PK STRL BLUE (TOWEL DISPOSABLE) ×2 IMPLANT
TOWEL OR 17X26 10 PK STRL BLUE (TOWEL DISPOSABLE) ×2 IMPLANT
TUBE ANAEROBIC SPECIMEN COL (MISCELLANEOUS) IMPLANT
WATER STERILE IRR 1000ML POUR (IV SOLUTION) ×2 IMPLANT

## 2012-03-22 NOTE — H&P (Signed)
Trevor Thompson is an 51 y.o. male.   Chief Complaint: Gangrene left foot HPI: Patient is a 51 year old gentleman with diabetes peripheral vascular disease who has failed foot salvage surgery with progressive gangrenous changes and presents at this time for transtibial amputation.  Past Medical History  Diagnosis Date  . Type 1 diabetes mellitus, uncontrolled   . Hypertension   . Renal disorder   . PVD (peripheral vascular disease)   . S/p cadaver renal transplant   . Cold intolerance   . 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 GERD (gastroesophageal reflux disease)   . 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 Hypertriglyceridemia   . Diabetic retinopathy     Past Surgical History  Procedure Date  . Kidney transplant 2004    right  . Cataract extraction w/ intraocular lens  implant, bilateral ~ 1997  . Av fistula placement 1997-2004    "I've got 3; right upper arm; right forearm; left forearm "  . Av fistula repair 1997-2004    "multiple"  . Amputation 02/25/2012    Procedure: AMPUTATION DIGIT;  Surgeon: Larina Earthly, MD;  Location: [facility] OR;  Service: Vascular;  Laterality: Left;  Left 2nd Toe Amputation, Incision and Drainage left lateral foot.    Family History  Problem Relation Age of Onset  . Diabetes Mother   . Diabetes Father   . Lupus Sister    Social History:  reports that he quit smoking about 30 years ago. His smoking use included Cigarettes. He has a 1.5 pack-year smoking history. He has never used smokeless tobacco. He reports that he drinks about .6 ounces of alcohol per week. His drug history not on file.  Allergies:  Allergies  Allergen Reactions  . Penicillins Other (See Comments)    Since birth    No prescriptions prior to admission    Results for orders placed during the hospital encounter of 03/20/12 (from the past 48 hour(s))  SURGICAL PCR SCREEN     Status: Normal   Collection Time   03/20/12 10:58 AM      Component Value Range Comment   MRSA,  PCR NEGATIVE  NEGATIVE    Staphylococcus aureus NEGATIVE  NEGATIVE   APTT     Status: Normal   Collection Time   03/20/12 11:24 AM      Component Value Range Comment   aPTT 35  24 - 37 seconds   CBC     Status: Abnormal   Collection Time   03/20/12 11:24 AM      Component Value Range Comment   WBC 13.3 (*) 4.0 - 10.5 K/uL    RBC 5.03  4.22 - 5.81 MIL/uL    Hemoglobin 13.8  13.0 - 17.0 g/dL    HCT 95.2  84.1 - 32.4 %    MCV 85.5  78.0 - 100.0 fL    MCH 27.4  26.0 - 34.0 pg    MCHC 32.1  30.0 - 36.0 g/dL    RDW 40.1  02.7 - 25.3 %    Platelets 223  150 - 400 K/uL   COMPREHENSIVE METABOLIC PANEL     Status: Abnormal   Collection Time   03/20/12 11:24 AM      Component Value Range Comment   Sodium 140  135 - 145 mEq/L    Potassium 4.2  3.5 - 5.1 mEq/L    Chloride 107  96 - 112 mEq/L    CO2 23  19 - 32 mEq/L  Glucose, Bld 77  70 - 99 mg/dL    BUN 28 (*) 6 - 23 mg/dL    Creatinine, Ser 1.61  0.50 - 1.35 mg/dL    Calcium 09.6  8.4 - 10.5 mg/dL    Total Protein 8.4 (*) 6.0 - 8.3 g/dL    Albumin 2.4 (*) 3.5 - 5.2 g/dL    AST 15  0 - 37 U/L    ALT 15  0 - 53 U/L    Alkaline Phosphatase 115  39 - 117 U/L    Total Bilirubin 0.5  0.3 - 1.2 mg/dL    GFR calc non Af Amer >90  >90 mL/min    GFR calc Af Amer >90  >90 mL/min   PROTIME-INR     Status: Normal   Collection Time   03/20/12 11:24 AM      Component Value Range Comment   Prothrombin Time 13.9  11.6 - 15.2 seconds    INR 1.05  0.00 - 1.49    Dg Chest 2 View  03/20/2012  *RADIOLOGY REPORT*  Clinical Data:  Preadmission for below-knee leg amputation  CHEST - 2 VIEW  Comparison: 02/28/2012  Findings: Cardiomediastinal silhouette is stable.  Stable scarring in the right midlung and right base.  No acute infiltrate or pulmonary edema.  Bony thorax is stable.  IMPRESSION: No active disease.  Stable scarring in the right midlung and right base.   Original Report Authenticated By: Natasha Mead, M.D.     Review of Systems  All other  systems reviewed and are negative.    There were no vitals taken for this visit. Physical Exam  On examination patient has gangrenous changes to the left foot. He has a necrotic wound from previous second toe amputation. Assessment/Plan Assessment: Gangrene left foot failed foot salvage surgery.  Plan: Will plan for a left transtibial amputation. Risks and benefits were discussed including infection neurovascular injury nonhealing of the wound need for additional surgery. Patient states he understands and wished to proceed at this time.  DUDA,[name] 03/22/2012, 6:19 AM

## 2012-03-22 NOTE — Progress Notes (Signed)
UR COMPLETED  

## 2012-03-22 NOTE — Transfer of Care (Signed)
Immediate Anesthesia Transfer of Care Note  Patient: Trevor Thompson  Procedure(s) Performed: Procedure(s) (LRB) with comments: AMPUTATION BELOW KNEE (Left) - Left Below Knee Amputation  Patient Location: PACU  Anesthesia Type: General  Level of Consciousness: awake, alert  and oriented  Airway & Oxygen Therapy: Patient Spontanous Breathing  Post-op Assessment: Report given to PACU RN  Post vital signs: stable  Complications: No apparent anesthesia complications

## 2012-03-22 NOTE — Anesthesia Procedure Notes (Signed)
Procedure Name: Intubation Date/Time: 03/22/2012 10:03 AM Performed by: Ellin Goodie Pre-anesthesia Checklist: Patient identified, Emergency Drugs available, Suction available, Patient being monitored and Timeout performed Patient Re-evaluated:Patient Re-evaluated prior to inductionOxygen Delivery Method: Circle system utilized Preoxygenation: Pre-oxygenation with 100% oxygen Intubation Type: IV induction, Rapid sequence and Cricoid Pressure applied Ventilation: Mask ventilation without difficulty Laryngoscope Size: Mac and 4 Grade View: Grade I Tube type: Oral Tube size: 7.5 mm Number of attempts: 1 Airway Equipment and Method: Stylet Placement Confirmation: ETT inserted through vocal cords under direct vision,  positive ETCO2 and breath sounds checked- equal and bilateral Secured at: 22 cm Tube secured with: Tape Dental Injury: Teeth and Oropharynx as per pre-operative assessment  Comments: Patient reported that he drank a full glass of grape juice at 0600.  Dr. Noreene Larsson aware,  RSI done.  Intubation by EMT student and supervised by Dr. Noreene Larsson.  Carlynn Herald, CRNA

## 2012-03-22 NOTE — Progress Notes (Signed)
Call to dr. joslin for sign out 

## 2012-03-22 NOTE — Anesthesia Postprocedure Evaluation (Signed)
  Anesthesia Post-op Note  Patient: Trevor Thompson  Procedure(s) Performed: Procedure(s) (LRB) with comments: AMPUTATION BELOW KNEE (Left) - Left Below Knee Amputation  Patient Location: PACU  Anesthesia Type: General  Level of Consciousness: awake, alert  and oriented  Airway and Oxygen Therapy: Patient Spontanous Breathing and Patient connected to nasal cannula oxygen  Post-op Pain: mild  Post-op Assessment: Post-op Vital signs reviewed and Patient's Cardiovascular Status Stable  Post-op Vital Signs: stable  Complications: No apparent anesthesia complications

## 2012-03-22 NOTE — Progress Notes (Signed)
ANTICOAGULATION CONSULT NOTE - Initial Consult  Pharmacy Consult for Coumadin Indication: VTE prophylaxis  Allergies  Allergen Reactions  . Penicillins Other (See Comments)    Since birth    Vital Signs: Temp: 98.4 F (36.9 C) (09/18 1300) Temp src: Oral (09/18 0902) BP: 103/64 mmHg (09/18 1300) Pulse Rate: 85  (09/18 1300)  Labs:  Basename 03/20/12 1124  HGB 13.8  HCT 43.0  PLT 223  APTT 35  LABPROT 13.9  INR 1.05  HEPARINUNFRC --  CREATININE 0.95  CKTOTAL --  CKMB --  TROPONINI --    The CrCl is unknown because both a height and weight (above a minimum accepted value) are required for this calculation.   Medical History: Past Medical History  Diagnosis Date  . Type 1 diabetes mellitus, uncontrolled   . Hypertension   . Renal disorder   . PVD (peripheral vascular disease)   . S/p cadaver renal transplant   . Cold intolerance   . 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 GERD (gastroesophageal reflux disease)   . 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 Hypertriglyceridemia   . Diabetic retinopathy     Medications:  Prescriptions prior to admission  Medication Sig Dispense Refill  . esomeprazole (NEXIUM) 40 MG capsule Take 40 mg by mouth daily before breakfast.      . gabapentin (NEURONTIN) 300 MG capsule Take 1 capsule (300 mg total) by mouth 3 (three) times daily.  90 capsule  0  . insulin aspart (NOVOLOG) 100 UNIT/ML injection Inject 12 Units into the skin 3 (three) times daily before meals.  1 vial  0  . insulin glargine (LANTUS) 100 UNIT/ML injection Inject 32 Units into the skin 2 (two) times daily.      . metoprolol tartrate (LOPRESSOR) 25 MG tablet Take 25 mg by mouth 2 (two) times daily.      . mycophenolate (MYFORTIC) 360 MG TBEC Take 360 mg by mouth 2 (two) times daily.       Marland Kitchen omega-3 acid ethyl esters (LOVAZA) 1 G capsule Take 2 g by mouth 2 (two) times daily.      . predniSONE (DELTASONE) 5 MG tablet Take 5 mg by mouth daily.      .  rosuvastatin (CRESTOR) 5 MG tablet Take 5 mg by mouth daily.      . tacrolimus (PROGRAF) 1 MG capsule Take 4 mg by mouth 2 (two) times daily.        Assessment: 51 y/o male patient admitted with gangrenous left foot s/p left BKA requiring coumadin for dvt prophylaxis. INR baseline wnl. Based upon CHEST guidelines, patient is at low risk for VTE, recommended to just use lovenox while hospitalized and discontinue at discharge.  Goal of Therapy:  INR 2-3 Monitor platelets by anticoagulation protocol: Yes   Plan:  Coumadin 2.5mg  today and f/u daily protime.  Verlene Mayer, PharmD, BCPS Pager [phone] 03/22/2012,2:42 PM

## 2012-03-22 NOTE — Progress Notes (Signed)
CARE MANAGEMENT NOTE 03/22/2012  Patient:  Trevor Thompson, Trevor Thompson   Account Number:  [id number]  Date Initiated:  03/22/2012  Documentation initiated by:  Vance Peper  Subjective/Objective Assessment:   51 yr old male s/p left left BKA. Patient has history of renal transplant.     Action/Plan:   CM spoke with patient and wife. They are interested in inpt rehab for therapy.  Patient given brochure and information for [facility] support group.   Anticipated DC Date:  03/24/2012   Anticipated DC Plan:           Choice offered to / List presented to:             Status of service:  In process, will continue to follow Medicare Important Message given?   (If response is "NO", the following Medicare IM given date fields will be blank) Date Medicare IM given:   Date Additional Medicare IM given:    Discharge Disposition:    Per UR Regulation:    If discussed at Long Length of Stay Meetings, dates discussed:    Comments:

## 2012-03-22 NOTE — Op Note (Signed)
OPERATIVE REPORT  DATE OF SURGERY: 03/22/2012  PATIENT:  Trevor Thompson,  51 y.o. male  PRE-OPERATIVE DIAGNOSIS:  Gangrene left Foot  POST-OPERATIVE DIAGNOSIS:  gangrene left foot  PROCEDURE:  Procedure(s): AMPUTATION BELOW KNEE  SURGEON:  Surgeon(s): Nadara Mustard, MD  ANESTHESIA:   general  EBL:  min ML  SPECIMEN:  Source of Specimen:  Left leg  TOURNIQUET:   Total Tourniquet Time Documented: Thigh (Left) - 8 minutes  PROCEDURE DETAILS: Patient is a 51 year old gentleman diabetic end-stage renal disease status post transplant who presents with gangrenous changes to his left foot. Patient has failed conservative treatment for limb salvage and presents at this time for transtibial amputation. Risks and benefits were discussed including infection nonhealing of the wound need for higher level amputation. Patient states he understands and wished to proceed at this time. Description of procedure patient was brought to the OR and underwent a general anesthetic. After adequate levels of anesthesia were obtained the foot was draped out with an impervious stockinette was not exposed the surgical field and the leg was prepped using DuraPrep draped into a sterile field. A transverse incision was made 11 cm distal to the tibial tubercle this curved proximally and a large posterior flap was created. The tibia was transected 1 cm proximal to the skin incision was beveled anteriorly and the fibula was transected just proximal to the tibial incision. Amputation knife was used to create a large posterior flap. The sciatic nerve was pulled cut and allowed to retract. The vascular bundles were suture ligated x2 each with 2-0 silk. The tourniquet was deflated after 7 minutes. Hemostasis was obtained. The deep and superficial fascial layers were closed using #1 PDS. The skin was closed using staples. The wound is covered with Adaptic orthopedic sponges AB dressing Kerlix and Coban. Patient was extubated taken to  the PACU in stable condition.  PLAN OF CARE: Admit to inpatient   PATIENT DISPOSITION:  PACU - hemodynamically stable.   Nadara Mustard, MD 03/22/2012 10:58 AM

## 2012-03-22 NOTE — Preoperative (Signed)
Beta Blockers   Reason not to administer Beta Blockers:Not Applicable 

## 2012-03-22 NOTE — Anesthesia Preprocedure Evaluation (Addendum)
Anesthesia Evaluation  Patient identified by MRN, date of birth, ID band Patient awake    Reviewed: Allergy & Precautions, H&P , NPO status , Patient's Chart, lab work & pertinent test results, reviewed documented beta blocker date and time   Airway Mallampati: I TM Distance: >3 FB     Dental  (+) Teeth Intact and Dental Advisory Given   Pulmonary  breath sounds clear to auscultation        Cardiovascular hypertension, Pt. on medications Rhythm:Regular Rate:Normal     Neuro/Psych  Neuromuscular disease    GI/Hepatic GERD-  Medicated,  Endo/Other  diabetes  Renal/GU Renal disease     Musculoskeletal   Abdominal (+)  Abdomen: soft. Bowel sounds: normal.  Peds  Hematology   Anesthesia Other Findings Post kidney transplant  Reproductive/Obstetrics                         Anesthesia Physical Anesthesia Plan  ASA: III  Anesthesia Plan: General   Post-op Pain Management:    Induction: Intravenous  Airway Management Planned: Oral ETT  Additional Equipment:   Intra-op Plan:   Post-operative Plan: Extubation in OR  Informed Consent: I have reviewed the patients History and Physical, chart, labs and discussed the procedure including the risks, benefits and alternatives for the proposed anesthesia with the patient or authorized representative who has indicated his/her understanding and acceptance.   Dental advisory given  Plan Discussed with: CRNA and Surgeon  Anesthesia Plan Comments: (Gangrene L LE Longstanding Type 1 DM glucose `170 Htn S/P renal transplant Cr 0.95  Plan GA with oral ETT)        Anesthesia Quick Evaluation

## 2012-03-23 ENCOUNTER — Encounter (HOSPITAL_COMMUNITY): Payer: Self-pay | Admitting: Orthopedic Surgery

## 2012-03-23 DIAGNOSIS — I739 Peripheral vascular disease, unspecified: Secondary | ICD-10-CM

## 2012-03-23 DIAGNOSIS — S88119A Complete traumatic amputation at level between knee and ankle, unspecified lower leg, initial encounter: Secondary | ICD-10-CM

## 2012-03-23 DIAGNOSIS — L98499 Non-pressure chronic ulcer of skin of other sites with unspecified severity: Secondary | ICD-10-CM

## 2012-03-23 LAB — GLUCOSE, CAPILLARY
Glucose-Capillary: 99 mg/dL (ref 70–99)
Glucose-Capillary: 74 mg/dL (ref 70–99)

## 2012-03-23 LAB — PROTIME-INR: Prothrombin Time: 14.3 seconds (ref 11.6–15.2)

## 2012-03-23 MED ORDER — WARFARIN VIDEO
Freq: Once | Status: AC
  Administered 2012-03-23: 14:00:00

## 2012-03-23 MED ORDER — COUMADIN BOOK
Freq: Once | Status: AC
  Administered 2012-03-23: 17:00:00
  Filled 2012-03-23: qty 1

## 2012-03-23 MED ORDER — GABAPENTIN 400 MG PO CAPS
400.0000 mg | ORAL_CAPSULE | Freq: Three times a day (TID) | ORAL | Status: DC
  Administered 2012-03-23 – 2012-03-24 (×4): 400 mg via ORAL
  Filled 2012-03-23 (×6): qty 1

## 2012-03-23 MED ORDER — WARFARIN SODIUM 2.5 MG PO TABS
2.5000 mg | ORAL_TABLET | Freq: Once | ORAL | Status: AC
  Administered 2012-03-23: 2.5 mg via ORAL
  Filled 2012-03-23: qty 1

## 2012-03-23 NOTE — Progress Notes (Signed)
I will follow up with pt in the morning to assess medical readiness to admit pt to inpt rehab. POD #2 on Friday. Please clarify. For questions please call me at [phone]

## 2012-03-23 NOTE — Evaluation (Signed)
Physical Therapy Evaluation Patient Details Name: Trevor Thompson MRN: [id number] DOB: 1960/12/07 Today's Date: 03/23/2012 Time: 7829-5621 PT Time Calculation (min): 25 min  PT Assessment / Plan / Recommendation Clinical Impression  Pt is 51 y/o male admitted for s/p left BKA and needs cues safety.  Pt will benefit from acute PT services to improve overall mobility and prepare for safety.  Highly recommend inpatient rehab for further strengthening, balance and prepare for prosthesis.    PT Assessment  Patient needs continued PT services    Follow Up Recommendations  Inpatient Rehab    Barriers to Discharge None      Equipment Recommendations  Rolling walker with 5" wheels    Recommendations for Other Services Rehab consult   Frequency Min 3X/week    Precautions / Restrictions Precautions Precautions: Fall Restrictions Weight Bearing Restrictions: Yes LLE Weight Bearing: Non weight bearing   Pertinent Vitals/Pain 7/10 left LE pain      Mobility  Bed Mobility Bed Mobility: Not assessed Supine to Sit: 5: Supervision Details for Bed Mobility Assistance: Supervision for safety Transfers Transfers: Sit to Stand;Stand to Sit Sit to Stand: 4: Min assist;From chair/3-in-1;With armrests;With upper extremity assist (decreased balance when standing ) Stand to Sit: 6: Modified independent (Device/Increase time);To chair/3-in-1;With upper extremity assist;With armrests Details for Transfer Assistance: During sit<>stand pt showed decreased balance, reporting he forgot he did not have his left leg anymore. Ambulation/Gait Ambulation/Gait Assistance: 4: Min assist Ambulation Distance (Feet): 20 Feet Assistive device: Rolling walker Ambulation/Gait Assistance Details: (A) to maintain balance with cues for hand placement and RW placement  Gait Pattern: Step-to pattern;Antalgic    Exercises Amputee Exercises Quad Sets: AAROM;Strengthening;Left;10 reps Hip ABduction/ADduction:  AAROM;Strengthening;Left;10 reps Straight Leg Raises: Strengthening;Left;10 reps   PT Diagnosis: Difficulty walking;Abnormality of gait;Acute pain  PT Problem List: Decreased balance;Decreased mobility;Decreased knowledge of use of DME;Decreased safety awareness;Pain PT Treatment Interventions: DME instruction;Gait training;Stair training;Functional mobility training;Therapeutic activities;Therapeutic exercise;Balance training;Patient/family education   PT Goals Acute Rehab PT Goals PT Goal Formulation: With patient Time For Goal Achievement: 03/30/12 Potential to Achieve Goals: Good Pt will go Supine/Side to Sit: with modified independence PT Goal: Supine/Side to Sit - Progress: Goal set today Pt will go Sit to Stand: with modified independence PT Goal: Sit to Stand - Progress: Goal set today Pt will go Stand to Sit: with modified independence PT Goal: Stand to Sit - Progress: Goal set today Pt will Ambulate: 51 - 150 feet;with rolling walker;with supervision PT Goal: Ambulate - Progress: Goal set today Pt will Perform Home Exercise Program: with supervision, verbal cues required/provided PT Goal: Perform Home Exercise Program - Progress: Goal set today  Visit Information  Last PT Received On: 03/23/12 Assistance Needed: +1    Subjective Data  Subjective: Pt reported "My wife has arthritis and she probably want be able to help as much.  She's using my cane now." Patient Stated Goal: Family and pt are agreeable to inpatient rehab   Prior Functioning  Home Living Lives With: Spouse Available Help at Discharge: Family;Available 24 hours/day Type of Home: House Home Access: Stairs to enter [employer] of Steps: 2 Entrance Stairs-Rails: None Home Layout: 1/2 bath on main level;Two level Alternate Level Stairs-Number of Steps: 14 Alternate Level Stairs-Rails: Right Bathroom Shower/Tub: Engineer, manufacturing systems: Standard Bathroom Accessibility: Yes How  Accessible: Accessible via walker Home Adaptive Equipment: None Prior Function Level of Independence: Independent with assistive device(s) Able to Take Stairs?: Yes Driving: No Vocation: On disability Communication Communication: No difficulties Dominant  Hand: Right    Cognition  Overall Cognitive Status: Appears within functional limits for tasks assessed/performed Arousal/Alertness: Awake/alert Orientation Level: Appears intact for tasks assessed Behavior During Session: [facility] for tasks performed    Extremity/Trunk Assessment Right Upper Extremity Assessment RUE ROM/Strength/Tone: Within functional levels (MMT 5/5) Left Upper Extremity Assessment LUE ROM/Strength/Tone: Within functional levels (MMT 5/5) Right Lower Extremity Assessment RLE ROM/Strength/Tone: WFL for tasks assessed Left Lower Extremity Assessment LLE ROM/Strength/Tone: Unable to fully assess;Due to pain Trunk Assessment Trunk Assessment: Normal   Balance    End of Session PT - End of Session Equipment Utilized During Treatment: Gait belt Activity Tolerance: Patient tolerated treatment well Patient left: in chair;with call bell/phone within reach Nurse Communication: Mobility status;Precautions  GP     Kaleb Sek 03/23/2012, 1:30 PM Jake Shark, PT DPT [phone]

## 2012-03-23 NOTE — Progress Notes (Signed)
I agree with the following treatment note after reviewing documentation.   Johnston, Jewelianna Pancoast Brynn   OTR/L Pager: [phone] Office: [phone] .   

## 2012-03-23 NOTE — PMR Pre-admission (Signed)
PMR Admission Coordinator Pre-Admission Assessment  Patient: Trevor Thompson is an 51 y.o., male MRN: [id number] DOB: 01/12/1961 Height:   Weight:    Insurance Information HMO:     PPO:      PCP:      IPA:      80/20: yes     OTHER: no HMO PRIMARY: Medicare a and b      Policy#: [id number]      Subscriber: pt Benefits:  Phone #: vision share     Name: 03/23/12 Eff. Date: a 01/03/96 and b 01/02/06     Deduct: $1184      Out of Pocket Max: none      Life Max: none CIR: 100%      SNF: 20 full days LBD 03/01/12 20 full days Outpatient: 80%     Co-Pay: 20% Home Health: 100%      Co-Pay: none DME: 80%     Co-Pay: 20% Providers: pt choice  SECONDARY: none        Medicaid Application Date:       Case Manager:   Emergency Contact Information Contact Information    Name Relation Home Work Mobile   Vanderwall,Dorothy Spouse [phone]  [phone]     Current Medical History  Patient Admitting Diagnosis:L BKA   History of Present Illness: Trevor Thompson is a 51 y.o. right-handed male with history of diabetes mellitus with peripheral neuropathy, renal transplant 2004, recent left second toe amputation secondary to peripheral vascular disease 02/25/2012. Admitted 03/22/2012 with nonhealing left foot and no change with conservative care. Limb was not felt to be salvageable. 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 Lajoyce Corners. Placed on Coumadin for DVT prophylaxis. Postoperative pain control. Physical and occupational therapy evaluations are pending. M.D. is requested physical medicine rehabilitation consult to consider inpatient rehabilitation services  States he was independent prior to admission  Patient's wife is home during the day however is not providing physical assistance do to help issues of her own   Past Medical History  Past Medical History  Diagnosis Date  . Type 1 diabetes mellitus, uncontrolled   . Hypertension   . Renal disorder   . PVD (peripheral vascular disease)   . S/p  cadaver renal transplant   . Cold intolerance   . 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 GERD (gastroesophageal reflux disease)   . 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 Hypertriglyceridemia   . Diabetic retinopathy     Family History  family history includes Diabetes in his father and mother and Lupus in his sister.  Prior Rehab/Hospitalizations: none   Current Medications  Current facility-administered medications:0.9 %  sodium chloride infusion, , Intravenous, Continuous, Nadara Mustard, MD, Last Rate: 20 mL/hr at 03/23/12 0229, 20 mL/hr at 03/23/12 0229;  atorvastatin (LIPITOR) tablet 10 mg, 10 mg, Oral, q1800, Nadara Mustard, MD, 10 mg at 03/23/12 1748;  coumadin book, , Does not apply, Once, Nadara Mustard, MD;  diphenhydrAMINE (BENADRYL) capsule 25 mg, 25 mg, Oral, Q4H PRN, Nadara Mustard, MD, 25 mg at 03/23/12 0309 gabapentin (NEURONTIN) capsule 400 mg, 400 mg, Oral, TID, Nadara Mustard, MD, 400 mg at 03/23/12 2205;  HYDROcodone-acetaminophen (NORCO/VICODIN) 5-325 MG per tablet 1-2 tablet, 1-2 tablet, Oral, Q4H PRN, Nadara Mustard, MD;  influenza  inactive virus vaccine (FLUZONE/FLUARIX) injection 0.5 mL, 0.5 mL, Intramuscular, Tomorrow-1000, Nadara Mustard, MD, 0.5 mL at 03/23/12 1219 insulin aspart (novoLOG) injection 0-15 Units, 0-15 Units, Subcutaneous, TID WC, Randa Evens  Lajoyce Corners, MD, 2 Units at 03/23/12 1652;  insulin aspart (novoLOG) injection 4 Units, 4 Units, Subcutaneous, TID WC, Nadara Mustard, MD, 2 Units at 03/23/12 1652;  insulin glargine (LANTUS) injection 32 Units, 32 Units, Subcutaneous, BID, Nadara Mustard, MD, 32 Units at 03/23/12 1023 methocarbamol (ROBAXIN) 500 mg in dextrose 5 % 50 mL IVPB, 500 mg, Intravenous, Q6H PRN, Nadara Mustard, MD;  methocarbamol (ROBAXIN) tablet 500 mg, 500 mg, Oral, Q6H PRN, Nadara Mustard, MD, 500 mg at 03/24/12 0818;  metoCLOPramide (REGLAN) injection 5-10 mg, 5-10 mg, Intravenous, Q8H PRN, Nadara Mustard, MD;  metoCLOPramide (REGLAN) tablet 5-10  mg, 5-10 mg, Oral, Q8H PRN, Nadara Mustard, MD metoprolol tartrate (LOPRESSOR) tablet 25 mg, 25 mg, Oral, BID, Nadara Mustard, MD, 25 mg at 03/23/12 2205;  morphine 2 MG/ML injection 2 mg, 2 mg, Intravenous, Q2H PRN, Nadara Mustard, MD, 2 mg at 03/24/12 0813;  mycophenolate (MYFORTIC) EC tablet 360 mg, 360 mg, Oral, BID, Nadara Mustard, MD, 360 mg at 03/23/12 2205;  omega-3 acid ethyl esters (LOVAZA) capsule 2 g, 2 g, Oral, BID, Nadara Mustard, MD, 2 g at 03/23/12 2206 ondansetron (ZOFRAN) injection 4 mg, 4 mg, Intravenous, Q6H PRN, Nadara Mustard, MD;  ondansetron [facility]) tablet 4 mg, 4 mg, Oral, Q6H PRN, Nadara Mustard, MD;  oxyCODONE-acetaminophen (PERCOCET/ROXICET) 5-325 MG per tablet 1-2 tablet, 1-2 tablet, Oral, Q4H PRN, Nadara Mustard, MD, 2 tablet at 03/24/12 0813;  pantoprazole (PROTONIX) EC tablet 40 mg, 40 mg, Oral, Q1200, Nadara Mustard, MD, 40 mg at 03/23/12 1213 pneumococcal 23 valent vaccine (PNU-IMMUNE) injection 0.5 mL, 0.5 mL, Intramuscular, Tomorrow-1000, Nadara Mustard, MD, 0.5 mL at 03/23/12 1216;  predniSONE (DELTASONE) tablet 5 mg, 5 mg, Oral, Daily, Nadara Mustard, MD, 5 mg at 03/23/12 1016;  tacrolimus (PROGRAF) capsule 4 mg, 4 mg, Oral, BID, Nadara Mustard, MD, 4 mg at 03/23/12 2206;  warfarin (COUMADIN) tablet 2.5 mg, 2.5 mg, Oral, ONCE-1800, Nadara Mustard, MD, 2.5 mg at 03/23/12 1748 warfarin (COUMADIN) video, , Does not apply, Once, Nadara Mustard, MD;  Warfarin - Pharmacist Dosing Inpatient, , Does not apply, q1800, Nadara Mustard, MD  Patients Current Diet: Carb Control  Precautions / Restrictions Precautions Precautions: Fall Restrictions Weight Bearing Restrictions: Yes LLE Weight Bearing: Non weight bearing   Prior Activity Level Community (5-7x/wk): assists with grandchildren. Back and forth to [facility] monthly Home Assistive Devices / Equipment Home Adaptive Equipment: None  Prior Functional Level Prior Function Level of Independence: Independent with assistive device(s) Able  to Take Stairs?: Yes Driving: No Vocation: On disability  Current Functional Level Cognition  Arousal/Alertness: Awake/alert Overall Cognitive Status: Appears within functional limits for tasks assessed/performed Orientation Level: Oriented X4    Extremity Assessment (includes Sensation/Coordination)  RUE ROM/Strength/Tone: Within functional levels (MMT 5/5)  RLE ROM/Strength/Tone: WFL for tasks assessed    ADLs  Eating/Feeding: Performed;Set up Where Assessed - Eating/Feeding: Chair Toilet Transfer: Simulated Toilet Transfer Method: Sit to stand Toilet Transfer Equipment: Raised toilet seat with arms (or 3-in-1 over toilet) Equipment Used: Gait belt;Rolling walker Transfers/Ambulation Related to ADLs: Pt. demonstrated decreased balance when transfering sit<>stand,stating he forgot he didn't have his left leg anymore.  ADL Comments: Pt. educated on limb desensitization and to check wrapping every 4 hours to make sure it does not come loose. [email or web address] to keep leg elevated and knee straight to prevent contractures as well as using his UE's to push up from the chair  to increase UE strength for mobility during ADL tasks.     Mobility  Bed Mobility: Supine to Sit Supine to Sit: 5: Supervision    Transfers  Transfers: Sit to Stand;Stand to Sit Sit to Stand: 4: Min assist;From chair/3-in-1;With armrests;With upper extremity assist Stand to Sit: 4: Min assist;To toilet;With armrests    Ambulation / Gait / Stairs / Wheelchair Mobility  Ambulation/Gait Ambulation/Gait Assistance: 4: Min Environmental consultant (Feet): 20 Feet Assistive device: Rolling walker Ambulation/Gait Assistance Details: (A) to maintain balance with cues for hand placement and RW placement  Gait Pattern: Step-to pattern;Antalgic Stairs: No    Posture / Balance       Previous Home Environment Lives With: Spouse Available Help at Discharge: Family;Available 24 hours/day Type of Home: House Home  Layout: 1/2 bath on main level;Two level Alternate Level Stairs-Rails: Right Alternate Level Stairs-Number of Steps: 14 Home Access: Stairs to enter Entrance Stairs-Rails: None Entrance Stairs-Number of Steps: 2 Bathroom Shower/Tub: Engineer, manufacturing systems: Standard Bathroom Accessibility: Yes How Accessible: Accessible via walker  Discharge Living Setting Plans for Discharge Living Setting: Patient's home;Lives with (comment) (spouse) Type of Home at Discharge: House Discharge Home Layout: Two level;1/2 bath on main level Alternate Level Stairs-Rails: Right Alternate Level Stairs-Number of Steps: 14 Discharge Home Access: Stairs to enter Entrance Stairs-Rails: None Entrance Stairs-Number of Steps: 2 Discharge Bathroom Shower/Tub: Tub/shower unit Discharge Bathroom Toilet: Standard Discharge Bathroom Accessibility: Yes How Accessible: Accessible via walker  Social/Family/Support Systems Patient Roles: Spouse Contact Information: Millan Legan, spouse Anticipated Caregiver: spouse Anticipated Caregiver's Contact Information: cell [phone] Ability/Limitations of Caregiver: wife uses cane due to arthritis b knees Caregiver Availability: 24/7 Discharge Plan Discussed with Primary Caregiver: Yes Is Caregiver In Agreement with Plan?: Yes Does Caregiver/Family have Issues with Lodging/Transportation while Pt is in Rehab?: No  Goals/Additional Needs Patient/Family Goal for Rehab: Mod I PT and OT Expected length of stay: ELOS 7 to 10 days Special Service Needs: kidney transplant Pt/Family Agrees to Admission and willing to participate: Yes Program Orientation Provided & Reviewed with Pt/Caregiver Including Roles  & Responsibilities: Yes  Patient Condition: This patient's condition remains as documented in the Consult dated 03/23/12, in which the Rehabilitation Physician determined and documented that the patient's condition is appropriate for intensive rehabilitative care  in an inpatient rehabilitation facility.  Preadmission Screen Completed By:  Clois Dupes, 03/24/2012 9:57 AM ______________________________________________________________________   Discussed status with Dr.  Riley Kill on 03/23/12 at  [phone] and received telephone approval for admission today.  Admission Coordinator:  Clois Dupes, time 6962 Date [id number].

## 2012-03-23 NOTE — Progress Notes (Signed)
ANTICOAGULATION CONSULT NOTE - Initial Consult  Pharmacy Consult for Coumadin Indication: VTE prophylaxis  Allergies  Allergen Reactions  . Penicillins Other (See Comments)    Since birth    Vital Signs: Temp: 98.1 F (36.7 C) (09/19 0981) Temp src: Oral (09/18 2109) BP: 105/73 mmHg (09/19 0608) Pulse Rate: 79  (09/19 0608)  Labs:  Basename 03/20/12 1124  HGB 13.8  HCT 43.0  PLT 223  APTT 35  LABPROT 13.9  INR 1.05  HEPARINUNFRC --  CREATININE 0.95  CKTOTAL --  CKMB --  TROPONINI --    The CrCl is unknown because both a height and weight (above a minimum accepted value) are required for this calculation.   Medical History: Past Medical History  Diagnosis Date  . Type 1 diabetes mellitus, uncontrolled   . Hypertension   . Renal disorder   . PVD (peripheral vascular disease)   . S/p cadaver renal transplant   . Cold intolerance   . 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 GERD (gastroesophageal reflux disease)   . 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 Hypertriglyceridemia   . Diabetic retinopathy     Medications:  Prescriptions prior to admission  Medication Sig Dispense Refill  . esomeprazole (NEXIUM) 40 MG capsule Take 40 mg by mouth daily before breakfast.      . gabapentin (NEURONTIN) 300 MG capsule Take 1 capsule (300 mg total) by mouth 3 (three) times daily.  90 capsule  0  . insulin aspart (NOVOLOG) 100 UNIT/ML injection Inject 12 Units into the skin 3 (three) times daily before meals.  1 vial  0  . insulin glargine (LANTUS) 100 UNIT/ML injection Inject 32 Units into the skin 2 (two) times daily.      . metoprolol tartrate (LOPRESSOR) 25 MG tablet Take 25 mg by mouth 2 (two) times daily.      . mycophenolate (MYFORTIC) 360 MG TBEC Take 360 mg by mouth 2 (two) times daily.       Marland Kitchen omega-3 acid ethyl esters (LOVAZA) 1 G capsule Take 2 g by mouth 2 (two) times daily.      . predniSONE (DELTASONE) 5 MG tablet Take 5 mg by mouth daily.      .  rosuvastatin (CRESTOR) 5 MG tablet Take 5 mg by mouth daily.      . tacrolimus (PROGRAF) 1 MG capsule Take 4 mg by mouth 2 (two) times daily.        Assessment: 51 y/o male patient admitted with gangrenous left foot s/p left BKA requiring coumadin for dvt prophylaxis. INR baseline wnl, no INR reported today, will f/u. Based upon CHEST guidelines, patient is at low risk for VTE, recommended to just use lovenox while hospitalized and discontinue at discharge.  Goal of Therapy:  INR 2-3 Monitor platelets by anticoagulation protocol: Yes   Plan:  Repeat Coumadin 2.5mg  today and f/u daily protime.  Verlene Mayer, PharmD, BCPS Pager [phone] 03/23/2012,8:53 AM

## 2012-03-23 NOTE — Consult Note (Signed)
Physical Medicine and Rehabilitation Consult Reason for Consult: Left BKA Referring Physician: Dr. Lajoyce Corners   HPI: Trevor Thompson is a 51 y.o. right-handed male with history of diabetes mellitus with peripheral neuropathy, renal transplant 2004, recent left second toe amputation secondary to peripheral vascular disease 02/25/2012. Admitted 03/22/2012 with nonhealing left foot and no change with conservative care. Limb was not felt to be salvageable. 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 Lajoyce Corners. Placed on Coumadin for DVT prophylaxis. Postoperative pain control. Physical and occupational therapy evaluations are pending. M.D. is requested physical medicine rehabilitation consult to consider inpatient rehabilitation services States he was independent prior to admission Patient's wife is home during the day however is not providing physical assistance do to help issues of her own  Review of Systems  HENT:       Decrease in vision secondary to diabetic retinopathy  Musculoskeletal: Positive for myalgias and joint pain.  Neurological: Positive for tingling.  All other systems reviewed and are negative.   Past Medical History  Diagnosis Date  . Type 1 diabetes mellitus, uncontrolled   . Hypertension   . Renal disorder   . PVD (peripheral vascular disease)   . S/p cadaver renal transplant   . Cold intolerance   . 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 GERD (gastroesophageal reflux disease)   . 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 Hypertriglyceridemia   . Diabetic retinopathy    Past Surgical History  Procedure Date  . Kidney transplant 2004    right  . Cataract extraction w/ intraocular lens  implant, bilateral ~ 1997  . Av fistula placement 1997-2004    "I've got 3; right upper arm; right forearm; left forearm "  . Av fistula repair 1997-2004    "multiple"  . Amputation 02/25/2012    Procedure: AMPUTATION DIGIT;  Surgeon: Larina Earthly, MD;  Location: [facility] OR;   Service: Vascular;  Laterality: Left;  Left 2nd Toe Amputation, Incision and Drainage left lateral foot.   Family History  Problem Relation Age of Onset  . Diabetes Mother   . Diabetes Father   . Lupus Sister    Social History:  reports that he quit smoking about 30 years ago. His smoking use included Cigarettes. He has a 1.5 pack-year smoking history. He has never used smokeless tobacco. He reports that he drinks about .6 ounces of alcohol per week. His drug history not on file. Allergies:  Allergies  Allergen Reactions  . Penicillins Other (See Comments)    Since birth   Medications Prior to Admission  Medication Sig Dispense Refill  . esomeprazole (NEXIUM) 40 MG capsule Take 40 mg by mouth daily before breakfast.      . gabapentin (NEURONTIN) 300 MG capsule Take 1 capsule (300 mg total) by mouth 3 (three) times daily.  90 capsule  0  . insulin aspart (NOVOLOG) 100 UNIT/ML injection Inject 12 Units into the skin 3 (three) times daily before meals.  1 vial  0  . insulin glargine (LANTUS) 100 UNIT/ML injection Inject 32 Units into the skin 2 (two) times daily.      . metoprolol tartrate (LOPRESSOR) 25 MG tablet Take 25 mg by mouth 2 (two) times daily.      . mycophenolate (MYFORTIC) 360 MG TBEC Take 360 mg by mouth 2 (two) times daily.       Marland Kitchen omega-3 acid ethyl esters (LOVAZA) 1 G capsule Take 2 g by mouth 2 (two) times daily.      Marland Kitchen  predniSONE (DELTASONE) 5 MG tablet Take 5 mg by mouth daily.      . rosuvastatin (CRESTOR) 5 MG tablet Take 5 mg by mouth daily.      . tacrolimus (PROGRAF) 1 MG capsule Take 4 mg by mouth 2 (two) times daily.        Home: Home Living Lives With: Spouse Available Help at Discharge: Family;Available 24 hours/day Type of Home: House Home Access: Stairs to enter [employer] of Steps: 2 Entrance Stairs-Rails: None Home Layout: 1/2 bath on main level;Two level Alternate Level Stairs-Number of Steps: 14 Alternate Level Stairs-Rails:  Right Bathroom Shower/Tub: Engineer, manufacturing systems: Standard Bathroom Accessibility: Yes How Accessible: Accessible via walker Home Adaptive Equipment: None  Functional History: Prior Function Able to Take Stairs?: Yes Driving: No Vocation: On disability Functional Status:  Mobility:          ADL:    Cognition: Cognition Arousal/Alertness: Awake/alert Orientation Level: Oriented X4 Cognition Overall Cognitive Status: Appears within functional limits for tasks assessed/performed Arousal/Alertness: Awake/alert Orientation Level: Appears intact for tasks assessed Behavior During Session: [facility] for tasks performed  Blood pressure 105/73, pulse 79, temperature 98.1 F (36.7 C), temperature source Oral, resp. rate 18, SpO2 98.00%. Physical Exam  Vitals reviewed. Constitutional: He is oriented to person, place, and time.  HENT:  Head: Normocephalic.  Eyes:       Pupils are sluggish to light but reactive  Neck: Neck supple. No thyromegaly present.  Cardiovascular: Normal rate and regular rhythm.   Pulmonary/Chest: Breath sounds normal. He has no wheezes.  Abdominal: Bowel sounds are normal. He exhibits no distension. There is no tenderness.  Neurological: He is alert and oriented to person, place, and time.  Skin:       Amputation sites dressed  Psychiatric: He has a normal mood and affect.  Motor strength is 5/5 in bilateral deltoid, biceps, triceps, grip Right hip flexor knee extensor ankle dorsiflexor 5/5 Left hip flexor 4/5 knee flexor extensor 3-/5 secondary to range of motion. Extremity has coban  wrap on left stump  Results for orders placed during the hospital encounter of 03/22/12 (from the past 24 hour(s))  GLUCOSE, CAPILLARY     Status: Abnormal   Collection Time   03/22/12  3:58 PM      Component Value Range   Glucose-Capillary 174 (*) 70 - 99 mg/dL   Comment 1 Notify RN    GLUCOSE, CAPILLARY     Status: Abnormal   Collection Time   03/22/12  5:54  PM      Component Value Range   Glucose-Capillary 149 (*) 70 - 99 mg/dL   Comment 1 Notify RN    GLUCOSE, CAPILLARY     Status: Abnormal   Collection Time   03/22/12  9:45 PM      Component Value Range   Glucose-Capillary 101 (*) 70 - 99 mg/dL   Comment 1 Notify RN    GLUCOSE, CAPILLARY     Status: Normal   Collection Time   03/23/12  6:42 AM      Component Value Range   Glucose-Capillary 99  70 - 99 mg/dL  PROTIME-INR     Status: Normal   Collection Time   03/23/12  8:55 AM      Component Value Range   Prothrombin Time 14.3  11.6 - 15.2 seconds   INR 1.13  0.00 - 1.49  GLUCOSE, CAPILLARY     Status: Normal   Collection Time   03/23/12 11:00 AM  Component Value Range   Glucose-Capillary 74  70 - 99 mg/dL   Comment 1 Documented in Chart     Comment 2 Notify RN     No results found.  Assessment/Plan: Diagnosis: Left below knee amputation due to peripheral vascular disease 1. Does the need for close, 24 hr/day medical supervision in concert with the patient's rehab needs make it unreasonable for this patient to be served in a less intensive setting? Yes 2. Co-Morbidities requiring supervision/potential complications: Autonomic neuropathy, diabetic neuropathy, diabetic retinopathy 3. Due to bladder management, bowel management, safety, skin/wound care, disease management, medication administration, pain management and patient education, does the patient require 24 hr/day rehab nursing? Yes 4. Does the patient require coordinated care of a physician, rehab nurse, PT (1-2 hrs/day, 5 days/week) and OT (1-2 hrs/day, 5 days/week) to address physical and functional deficits in the context of the above medical diagnosis(es)? Yes Addressing deficits in the following areas: endurance, Balance, safety, pain medications, patient education 5. Can the patient actively participate in an intensive therapy program of at least 3 hrs of therapy per day at least 5 days per week? Yes 6. The potential  for patient to make measurable gains while on inpatient rehab is good 7. Anticipated functional outcomes upon discharge from inpatient rehab are Modified independent mobility with PT, Modified independent ADLs with OT, Not applicable with SLP. 8. Estimated rehab length of stay to reach the above functional goals is: 7 the 10 days 9. Does the patient have adequate social supports to accommodate these discharge functional goals? Potentially 10. Anticipated D/C setting: Home 11. Anticipated post D/C treatments: HH therapy 12. Overall Rehab/Functional Prognosis: excellent  RECOMMENDATIONS: This patient's condition is appropriate for continued rehabilitative care in the following setting: CIR Patient has agreed to participate in recommended program. Potentially Note that insurance prior authorization may be required for reimbursement for recommended care.  Comment:    03/23/2012

## 2012-03-23 NOTE — Progress Notes (Signed)
Occupational Therapy Evaluation Patient Details Name: Trevor Thompson MRN: [id number] DOB: 07-Mar-1961 Today's Date: 03/23/2012 Time: 4098-1191 OT Time Calculation (min): 19 min  OT Assessment / Plan / Recommendation Clinical Impression  Pt.51 yo male with left transtibial amputation. Pt would benefit from OT acutely and is interested in CIR.    OT Assessment  Patient needs continued OT Services    Follow Up Recommendations  Inpatient Rehab    Barriers to Discharge      Equipment Recommendations  Rolling walker with 5" wheels    Recommendations for Other Services    Frequency  Min 2X/week    Precautions / Restrictions Precautions Precautions: Fall Restrictions Weight Bearing Restrictions: Yes LLE Weight Bearing: Non weight bearing   Pertinent Vitals/Pain Pain level 8/10    ADL  Eating/Feeding: Performed;Set up Where Assessed - Eating/Feeding: Chair Toilet Transfer: Simulated Toilet Transfer Method: Sit to stand Toilet Transfer Equipment: Raised toilet seat with arms (or 3-in-1 over toilet) Equipment Used: Gait belt;Rolling walker Transfers/Ambulation Related to ADLs: Pt. demonstrated decreased balance when transfering sit<>stand,stating he forgot he didn't have his left leg anymore.  ADL Comments: Pt. educated on limb desensitization and to check wrapping every 4 hours to make sure it does not come loose. [email or web address] to keep leg elevated and knee straight to prevent contractures as well as using his UE's to push up from the chair to increase UE strength for mobility during ADL tasks.     OT Diagnosis: Generalized weakness;Acute pain  OT Problem List: Decreased activity tolerance;Impaired balance (sitting and/or standing);Decreased knowledge of use of DME or AE;Decreased knowledge of precautions;Impaired sensation;Pain OT Treatment Interventions: Self-care/ADL training;Therapeutic exercise;Energy conservation;DME and/or AE instruction;Patient/family education   OT Goals Acute  Rehab OT Goals OT Goal Formulation: With patient ADL Goals Pt Will Perform Grooming: with modified independence;Standing at sink;Supported ADL Goal: Grooming - Progress: Goal set today Pt Will Perform Lower Body Bathing: Sit to stand from chair;Unsupported;with modified independence ADL Goal: Lower Body Bathing - Progress: Goal set today Pt Will Perform Lower Body Dressing: Sit to stand from chair;Supported;with modified independence ADL Goal: Lower Body Dressing - Progress: Goal set today Pt Will Transfer to Toilet: with modified independence;Ambulation;3-in-1 ADL Goal: Toilet Transfer - Progress: Goal set today Pt Will Perform Toileting - Clothing Manipulation: with modified independence;Standing ADL Goal: Toileting - Clothing Manipulation - Progress: Goal set today Pt Will Perform Toileting - Hygiene: with modified independence;Sit to stand from 3-in-1/toilet ADL Goal: Toileting - Hygiene - Progress: Goal set today Pt Will Perform Tub/Shower Transfer: with modified independence;Transfer tub bench;Ambulation ADL Goal: Tub/Shower Transfer - Progress: Goal set today  Visit Information  Last OT Received On: 03/23/12 Assistance Needed: +1    Subjective Data  Subjective: I feel like my foot is itching   Prior Functioning  Vision/Perception  Home Living Lives With: Spouse Available Help at Discharge: Family;Available 24 hours/day Type of Home: House Home Access: Stairs to enter [employer] of Steps: 2 Entrance Stairs-Rails: None Home Layout: 1/2 bath on main level;Two level Alternate Level Stairs-Number of Steps: 14 Alternate Level Stairs-Rails: Right Bathroom Shower/Tub: Engineer, manufacturing systems: Standard Bathroom Accessibility: Yes How Accessible: Accessible via walker Home Adaptive Equipment: None Prior Function Level of Independence: Independent with assistive device(s) Able to Take Stairs?: Yes Driving: No Vocation: On  disability Communication Communication: No difficulties Dominant Hand: Right      Cognition  Overall Cognitive Status: Appears within functional limits for tasks assessed/performed Arousal/Alertness: Awake/alert Orientation Level: Appears intact for tasks assessed Behavior During  Session: [facility] for tasks performed    Extremity/Trunk Assessment Right Upper Extremity Assessment RUE ROM/Strength/Tone: Within functional levels (MMT 5/5) Left Upper Extremity Assessment LUE ROM/Strength/Tone: Within functional levels (MMT 5/5) Trunk Assessment Trunk Assessment: Normal   Mobility    Bed Mobility Bed Mobility: Not assessed Transfers Transfers: Sit to Stand;Stand to Sit Sit to Stand: 4: Min assist;From chair/3-in-1;With armrests;With upper extremity assist (decreased balance when standing ) Stand to Sit: 6: Modified independent (Device/Increase time);To chair/3-in-1;With upper extremity assist;With armrests Details for Transfer Assistance: During sit<>stand pt showed decreased balance, reporting he forgot he did not have his left leg anymore.                 End of Session OT - End of Session Equipment Utilized During Treatment: Gait belt Activity Tolerance: Patient tolerated treatment well Patient left: in chair;with call bell/phone within reach  GO     Cleora Fleet 03/23/2012, 12:33 PM

## 2012-03-23 NOTE — Progress Notes (Signed)
Patient ID: Trevor Thompson, male   DOB: Dec 16, 1960, 51 y.o.   MRN: [id number] Postoperative day 1 left transtibial amputation. Patient's family state they do not feel comfortable taking care of the patient at home they requested inpatient versus outpatient rehabilitation. Consultations made for inpatient rehabilitation versus short-term skilled nursing.

## 2012-03-24 ENCOUNTER — Inpatient Hospital Stay (HOSPITAL_COMMUNITY)
Admission: RE | Admit: 2012-03-24 | Discharge: 2012-04-01 | DRG: 945 | Disposition: A | Discharge status: Home Health | Payer: Medicare Other | Source: Ambulatory Visit | Attending: Physical Medicine & Rehabilitation | Admitting: Physical Medicine & Rehabilitation

## 2012-03-24 DIAGNOSIS — T380X5A Adverse effect of glucocorticoids and synthetic analogues, initial encounter: Secondary | ICD-10-CM | POA: Diagnosis not present

## 2012-03-24 DIAGNOSIS — Z89519 Acquired absence of unspecified leg below knee: Secondary | ICD-10-CM

## 2012-03-24 DIAGNOSIS — E11319 Type 2 diabetes mellitus with unspecified diabetic retinopathy without macular edema: Secondary | ICD-10-CM | POA: Diagnosis present

## 2012-03-24 DIAGNOSIS — E1149 Type 2 diabetes mellitus with other diabetic neurological complication: Secondary | ICD-10-CM | POA: Diagnosis present

## 2012-03-24 DIAGNOSIS — S98139A Complete traumatic amputation of one unspecified lesser toe, initial encounter: Secondary | ICD-10-CM

## 2012-03-24 DIAGNOSIS — Z5189 Encounter for other specified aftercare: Secondary | ICD-10-CM

## 2012-03-24 DIAGNOSIS — D72829 Elevated white blood cell count, unspecified: Secondary | ICD-10-CM | POA: Diagnosis not present

## 2012-03-24 DIAGNOSIS — Z794 Long term (current) use of insulin: Secondary | ICD-10-CM

## 2012-03-24 DIAGNOSIS — E785 Hyperlipidemia, unspecified: Secondary | ICD-10-CM | POA: Diagnosis present

## 2012-03-24 DIAGNOSIS — I739 Peripheral vascular disease, unspecified: Secondary | ICD-10-CM

## 2012-03-24 DIAGNOSIS — S88119A Complete traumatic amputation at level between knee and ankle, unspecified lower leg, initial encounter: Secondary | ICD-10-CM

## 2012-03-24 DIAGNOSIS — Z940 Kidney transplant status: Secondary | ICD-10-CM

## 2012-03-24 DIAGNOSIS — Z87891 Personal history of nicotine dependence: Secondary | ICD-10-CM

## 2012-03-24 DIAGNOSIS — I10 Essential (primary) hypertension: Secondary | ICD-10-CM | POA: Diagnosis present

## 2012-03-24 DIAGNOSIS — E1139 Type 2 diabetes mellitus with other diabetic ophthalmic complication: Secondary | ICD-10-CM | POA: Diagnosis present

## 2012-03-24 DIAGNOSIS — L98499 Non-pressure chronic ulcer of skin of other sites with unspecified severity: Secondary | ICD-10-CM

## 2012-03-24 DIAGNOSIS — E1142 Type 2 diabetes mellitus with diabetic polyneuropathy: Secondary | ICD-10-CM | POA: Diagnosis present

## 2012-03-24 DIAGNOSIS — I70269 Atherosclerosis of native arteries of extremities with gangrene, unspecified extremity: Secondary | ICD-10-CM | POA: Diagnosis present

## 2012-03-24 LAB — BASIC METABOLIC PANEL
CO2: 26 mEq/L (ref 19–32)
Chloride: 102 mEq/L (ref 96–112)
Glucose, Bld: 90 mg/dL (ref 70–99)
Sodium: 135 mEq/L (ref 135–145)

## 2012-03-24 LAB — CBC
HCT: 34.6 % — ABNORMAL LOW (ref 39.0–52.0)
Hemoglobin: 11.1 g/dL — ABNORMAL LOW (ref 13.0–17.0)
MCH: 27.3 pg (ref 26.0–34.0)
MCV: 85 fL (ref 78.0–100.0)
RBC: 4.07 MIL/uL — ABNORMAL LOW (ref 4.22–5.81)
WBC: 15.8 10*3/uL — ABNORMAL HIGH (ref 4.0–10.5)

## 2012-03-24 LAB — PROTIME-INR: INR: 1.21 (ref 0.00–1.49)

## 2012-03-24 LAB — GLUCOSE, CAPILLARY
Glucose-Capillary: 90 mg/dL (ref 70–99)
Glucose-Capillary: 141 mg/dL — ABNORMAL HIGH (ref 70–99)

## 2012-03-24 MED ORDER — PANTOPRAZOLE SODIUM 40 MG PO TBEC
40.0000 mg | DELAYED_RELEASE_TABLET | Freq: Every day | ORAL | Status: DC
  Administered 2012-03-25 – 2012-03-31 (×7): 40 mg via ORAL
  Filled 2012-03-24 (×10): qty 1

## 2012-03-24 MED ORDER — OXYCODONE HCL 5 MG PO TABS
10.0000 mg | ORAL_TABLET | ORAL | Status: DC | PRN
  Administered 2012-03-24 – 2012-04-01 (×26): 10 mg via ORAL
  Filled 2012-03-24 (×26): qty 2

## 2012-03-24 MED ORDER — OMEGA-3-ACID ETHYL ESTERS 1 G PO CAPS
2.0000 g | ORAL_CAPSULE | Freq: Two times a day (BID) | ORAL | Status: DC
  Administered 2012-03-24 – 2012-04-01 (×16): 2 g via ORAL
  Filled 2012-03-24 (×18): qty 2

## 2012-03-24 MED ORDER — METHOCARBAMOL 100 MG/ML IJ SOLN
500.0000 mg | Freq: Four times a day (QID) | INTRAVENOUS | Status: DC | PRN
  Filled 2012-03-24: qty 5

## 2012-03-24 MED ORDER — WARFARIN SODIUM 2.5 MG PO TABS
2.5000 mg | ORAL_TABLET | Freq: Once | ORAL | Status: AC
  Administered 2012-03-24: 2.5 mg via ORAL
  Filled 2012-03-24: qty 1

## 2012-03-24 MED ORDER — WARFARIN SODIUM 2.5 MG PO TABS
2.5000 mg | ORAL_TABLET | Freq: Once | ORAL | Status: DC
  Filled 2012-03-24: qty 1

## 2012-03-24 MED ORDER — PREDNISONE 5 MG PO TABS
5.0000 mg | ORAL_TABLET | Freq: Every day | ORAL | Status: DC
  Administered 2012-03-25 – 2012-04-01 (×8): 5 mg via ORAL
  Filled 2012-03-24 (×9): qty 1

## 2012-03-24 MED ORDER — INSULIN ASPART 100 UNIT/ML ~~LOC~~ SOLN
4.0000 [IU] | Freq: Three times a day (TID) | SUBCUTANEOUS | Status: DC
  Administered 2012-03-24 – 2012-03-25 (×2): 4 [IU] via SUBCUTANEOUS

## 2012-03-24 MED ORDER — WARFARIN - PHARMACIST DOSING INPATIENT: Freq: Every day | Status: DC

## 2012-03-24 MED ORDER — SORBITOL 70 % SOLN
30.0000 mL | Freq: Every day | Status: DC | PRN
  Administered 2012-03-25: 30 mL via ORAL
  Filled 2012-03-24: qty 30

## 2012-03-24 MED ORDER — MYCOPHENOLATE SODIUM 180 MG PO TBEC
360.0000 mg | DELAYED_RELEASE_TABLET | Freq: Two times a day (BID) | ORAL | Status: DC
  Administered 2012-03-24 – 2012-04-01 (×16): 360 mg via ORAL
  Filled 2012-03-24 (×18): qty 2

## 2012-03-24 MED ORDER — METHOCARBAMOL 500 MG PO TABS
500.0000 mg | ORAL_TABLET | Freq: Four times a day (QID) | ORAL | Status: DC | PRN
  Administered 2012-03-24 – 2012-03-30 (×4): 500 mg via ORAL
  Filled 2012-03-24 (×4): qty 1

## 2012-03-24 MED ORDER — ACETAMINOPHEN 325 MG PO TABS: 325.0000 mg | ORAL_TABLET | ORAL | Status: DC | PRN

## 2012-03-24 MED ORDER — METOPROLOL TARTRATE 25 MG PO TABS
25.0000 mg | ORAL_TABLET | Freq: Two times a day (BID) | ORAL | Status: DC
  Administered 2012-03-24 – 2012-04-01 (×16): 25 mg via ORAL
  Filled 2012-03-24 (×18): qty 1

## 2012-03-24 MED ORDER — ONDANSETRON HCL 4 MG PO TABS: 4.0000 mg | ORAL_TABLET | Freq: Four times a day (QID) | ORAL | Status: DC | PRN

## 2012-03-24 MED ORDER — ONDANSETRON HCL 4 MG/2ML IJ SOLN
4.0000 mg | Freq: Four times a day (QID) | INTRAMUSCULAR | Status: DC | PRN
Start: 2012-03-24 — End: 2012-04-01

## 2012-03-24 MED ORDER — POLYETHYLENE GLYCOL 3350 17 G PO PACK
17.0000 g | PACK | Freq: Every day | ORAL | Status: DC | PRN
  Filled 2012-03-24: qty 1

## 2012-03-24 MED ORDER — INSULIN GLARGINE 100 UNIT/ML ~~LOC~~ SOLN
32.0000 [IU] | Freq: Two times a day (BID) | SUBCUTANEOUS | Status: DC
  Administered 2012-03-24 – 2012-03-26 (×5): 32 [IU] via SUBCUTANEOUS

## 2012-03-24 MED ORDER — GABAPENTIN 400 MG PO CAPS
400.0000 mg | ORAL_CAPSULE | Freq: Three times a day (TID) | ORAL | Status: DC
  Administered 2012-03-24 – 2012-04-01 (×23): 400 mg via ORAL
  Filled 2012-03-24 (×27): qty 1

## 2012-03-24 MED ORDER — TACROLIMUS 1 MG PO CAPS
4.0000 mg | ORAL_CAPSULE | Freq: Two times a day (BID) | ORAL | Status: DC
  Administered 2012-03-24 – 2012-04-01 (×16): 4 mg via ORAL
  Filled 2012-03-24 (×19): qty 4

## 2012-03-24 MED ORDER — ATORVASTATIN CALCIUM 10 MG PO TABS
10.0000 mg | ORAL_TABLET | Freq: Every day | ORAL | Status: DC
  Administered 2012-03-24 – 2012-03-31 (×8): 10 mg via ORAL
  Filled 2012-03-24 (×9): qty 1

## 2012-03-24 MED ORDER — INSULIN ASPART 100 UNIT/ML ~~LOC~~ SOLN
0.0000 [IU] | Freq: Three times a day (TID) | SUBCUTANEOUS | Status: DC
  Administered 2012-03-24: 2 [IU] via SUBCUTANEOUS
  Administered 2012-03-25 – 2012-03-29 (×3): 3 [IU] via SUBCUTANEOUS
  Administered 2012-03-30: 2 [IU] via SUBCUTANEOUS
  Administered 2012-03-30 – 2012-03-31 (×3): 3 [IU] via SUBCUTANEOUS
  Administered 2012-04-01: 5 [IU] via SUBCUTANEOUS

## 2012-03-24 NOTE — Progress Notes (Signed)
Assessment:  51 y/o male patient admitted with gangrenous left foot s/p left BKA requiring coumadin for dvt prophylaxis. Patient to continue coumadin on transfer to rehab Goal of Therapy:  INR 2-3  Monitor platelets by anticoagulation protocol: Yes  Plan:  Repeat Coumadin 2.5mg  today and f/u daily protime.  Thanks for allowing pharmacy to be a part of this patient's care.  Talbert Cage, PharmD Clinical Pharmacist, [phone]

## 2012-03-24 NOTE — Interval H&P Note (Signed)
Rachel Samples was admitted today to Inpatient Rehabilitation with the diagnosis of left BKA.  The patient's history has been reviewed, patient examined, and there is no change in status.  Patient continues to be appropriate for intensive inpatient rehabilitation.  I have reviewed the patient's chart and labs.  Questions were answered to the patient's satisfaction.  SWARTZ,ZACHARY T 03/24/2012, 6:40 PM

## 2012-03-24 NOTE — Evaluation (Signed)
Physical Therapy Assessment and Plan  Patient Details  Name: Trevor Thompson MRN: [id number] Date of Birth: 12-Aug-1960  PT Diagnosis: Abnormality of gait, Difficulty walking and Muscle weakness Rehab Potential: Good ELOS: 7-10 days   Today's Date: 03/24/2012 Time: 7829-5621 Time Calculation (min): 60 min  Problem List:  Patient Active Problem List  Diagnosis  . Cellulitis of left foot  . Necrotic toe  .  ? PVD (peripheral vascular disease)  . History of renal transplant  . 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 Insulin dependent type 2 diabetes mellitus, uncontrolled  . HTN (hypertension)  . Dehydration as evidenced by hemoconcentration  . Left foot pain  . Type 1 diabetes mellitus, uncontrolled  . Hypertension  . S/p cadaver renal transplant  . Cold intolerance  . 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 GERD (gastroesophageal reflux disease)  . 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 Hypertriglyceridemia  . Diabetic retinopathy  . Gangrene Associated With Diabetes Mellitus  . Leukocytosis  . Autonomic orthostatic hypotension  . Acute kidney injury  . Fever  . S/P BKA (below knee amputation)    Past Medical History:  Past Medical History  Diagnosis Date  . Type 1 diabetes mellitus, uncontrolled   . Hypertension   . Renal disorder   . PVD (peripheral vascular disease)   . S/p cadaver renal transplant   . Cold intolerance   . 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 GERD (gastroesophageal reflux disease)   . 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 Hypertriglyceridemia   . Diabetic retinopathy    Past Surgical History:  Past Surgical History  Procedure Date  . Kidney transplant 2004    right  . Cataract extraction w/ intraocular lens  implant, bilateral ~ 1997  . Av fistula placement 1997-2004    "I've got 3; right upper arm; right forearm; left forearm "  . Av fistula repair 1997-2004    "multiple"  . Amputation  02/25/2012    Procedure: AMPUTATION DIGIT;  Surgeon: Larina Earthly, MD;  Location: [facility] OR;  Service: Vascular;  Laterality: Left;  Left 2nd Toe Amputation, Incision and Drainage left lateral foot.  . Amputation 03/22/2012    Procedure: AMPUTATION BELOW KNEE;  Surgeon: Nadara Mustard, MD;  Location: [facility] OR;  Service: Orthopedics;  Laterality: Left;  Left Below Knee Amputation    Assessment & Plan Clinical Impression: Trevor Thompson is a 51 y.o. right-handed male with history of diabetes mellitus with peripheral neuropathy, renal transplant 2004, recent left second toe amputation secondary to peripheral vascular disease 02/25/2012. Patient was independent prior to admission. Admitted 03/22/2012 with nonhealing left foot and no change with conservative care. Limb was not felt to be salvageable. 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 Lajoyce Corners. Placed on Coumadin for DVT prophylaxis. Postoperative pain control. Physical and occupational therapy evaluations completed an ongoing. M.D. is requested physical medicine rehabilitation consult to consider inpatient rehabilitation services . Patient transferred to CIR on 03/24/2012 .   Patient currently requires min/ mod with mobility secondary to muscle weakness, impaired timing and sequencing and decreased standing balance and decreased balance strategies. Pt slightly over confident with abilities and has losses of balance particularly with turns as he goes too fast. Pt unable to perform steps today, would like to attempt hoping up sideways vs. going up on rear-end. Pt most limited by decreased endurance, strength, increased need for assist with transfers and ambulation, and difficulty with performing steps which is needed to get into  his house and up to living area. Prior to hospitalization, patient was modified independent with mobility and lived with Spouse in a House home.  Home access is 3Stairs to enter.  Patient will benefit from skilled PT intervention to  maximize safe functional mobility, minimize fall risk and decrease caregiver burden for planned discharge home with intermittent assist.  Anticipate patient will benefit from follow up [facility] at discharge.  PT - End of Session Endurance Deficit: Yes Endurance Deficit Description: Decreased endurance particularly with standing activity PT Assessment Rehab Potential: Good Barriers to Discharge: Decreased caregiver support PT Plan PT Frequency: 2-3 X/day, 60-90 minutes Estimated Length of Stay: 7-10 days PT Treatment/Interventions: Ambulation/gait training;Balance/vestibular training;Community reintegration;Discharge planning;Disease management/prevention;DME/adaptive equipment instruction;Functional mobility training;Neuromuscular re-education;Pain management;Patient/family education;Psychosocial support;Skin care/wound management;Stair training;Therapeutic Activities;Therapeutic Exercise;UE/LE Strength taining/ROM;Wheelchair propulsion/positioning PT Recommendation Follow Up Recommendations: Home health PT Equipment Recommended: Wheelchair (measurements);Wheelchair cushion (measurements) (potentially RW pending condition of pt's)  PT Evaluation Precautions/Restrictions Precautions Precautions: Fall Restrictions Weight Bearing Restrictions: Yes LLE Weight Bearing: Non weight bearing  Vital Signs Therapy Vitals Temp: 98 F (36.7 C) Temp src: Oral Pulse Rate: 88  Resp: 18  BP: 109/75 mmHg Patient Position, if appropriate: Sitting Oxygen Therapy SpO2: 96 % O2 Device: None (Room air) Pain Pain Assessment Pain Assessment: 0-10 Pain Score:   7 Pain Type: Acute pain Pain Location: Leg Pain Orientation: Left Pain Descriptors: Sharp;Pressure Pain Onset: On-going Patients Stated Pain Goal: 3 Pain Intervention(s): Repositioned;RN made aware Home Living/Prior Functioning Home Living Lives With: Spouse Available Help at Discharge: Family (wife will be able to provide 24hr supervision,  no assist) Type of Home: House Home Access: Stairs to enter Secretary/administrator of Steps: 3 Entrance Stairs-Rails: None Home Layout: 1/2 bath on main level;Two level Alternate Level Stairs-Number of Steps: 14 rail on Lt. Alternate Level Stairs-Rails: Left (intermittant Rt. railing) Bathroom Shower/Tub: Tub/shower unit Bathroom Toilet: Standard Bathroom Accessibility: Yes How Accessible: Accessible via walker Home Adaptive Equipment: Walker - rolling;Straight cane Prior Function Level of Independence: Independent with transfers;Independent with basic ADLs Able to Take Stairs?: Yes Driving: No Vocation: On disability Leisure: Hobbies-yes (Comment) Comments: watch football, play cards, play with grand kids Vision/Perception     Cognition Orientation Level: Oriented X4 Comments: Slightly over confident in abilities Sensation Sensation Light Touch: Impaired Detail Additional Comments: Pt feels some numbness in Rt. hand.  Motor  Motor Motor: Within Functional Limits  Mobility Bed Mobility Bed Mobility: Sit to Supine Supine to Sit: 5: Supervision Supine to Sit Details (indicate cue type and reason): Verbal cues for efficiency, pt struggling with transition and required multiple attempts however was able to perform without physical assistance.  Sit to Supine: 5: Supervision Sit to Supine - Details: Verbal cues for technique Transfers Sit to Stand: 4: Min assist;From bed;From chair/3-in-1;With armrests Sit to Stand Details: Verbal cues for safe use of DME/AE;Verbal cues for sequencing;Verbal cues for precautions/safety Sit to Stand Details (indicate cue type and reason): Pt improving with sit <> stand throughout evaluation, slight decreased balance upon standing.  Stand to Sit: 4: Min assist Stand to Sit Details: Decreased control of descent, cues needed for safe UE positioning and backing all the way to support surface prior to sitting. Locomotion  Ambulation Ambulation:  Yes Ambulation/Gait Assistance: 3: Mod assist Ambulation Distance (Feet): 25 Feet (x 2) Assistive device: Rolling walker Ambulation/Gait Assistance Details: Verbal cues for control of speed and control of Rt. foot placement (tends to land hard with Rt. foot) Min assist for straight path ambulation, moderate assist  with turns secondary to lateral and posterior losses of balance.  Gait Gait: Yes Gait Pattern: Impaired Gait Pattern:  (Hop gait, decreased UE control during gait. ) Stairs / Additional Locomotion Stairs: No (attempted, pt unable to perform today) Wheelchair Mobility Distance: 150'  Trunk/Postural Assessment  Cervical Assessment Cervical Assessment: Within Functional Limits Thoracic Assessment Thoracic Assessment: Within Functional Limits Lumbar Assessment Lumbar Assessment: Within Functional Limits  Balance Balance Balance Assessed: Yes Static Sitting Balance Static Sitting - Balance Support: No upper extremity supported (foot supported) Static Sitting - Level of Assistance: 5: Stand by assistance Static Standing Balance Static Standing - Balance Support: Right upper extremity supported;Left upper extremity supported Static Standing - Level of Assistance: 4: Min assist Extremity Assessment      RLE Assessment RLE Assessment: Exceptions to [facility] RLE AROM (degrees) RLE Overall AROM Comments: WFL RLE Strength RLE Overall Strength Comments: Functionally weak and deconditioned, with MMT grossly > 3+/5.  LLE Assessment LLE Assessment: Exceptions to WFL LLE AROM (degrees) LLE Overall AROM Comments: WFL - slightly decreased knee extension secondary to soreness LLE Strength LLE Overall Strength Comments: Hip flexion WFL, knee flexion/extension ~3-/5   See FIM for current functional status Refer to Care Plan for Long Term Goals  Skilled Therapeutic Interventions/Progress Updates:  Performed gait training x 25' (x 2) with RW. Cues for controlling landing of Rt. Foot to  decrease pounding effect with each step. Pt demonstrating fatigue with short distance hopping and decreased UE strength. Pt had loss of balance with turns requiring moderate assist to prevent falling (x 3 occurences). Educated pt on residual limb positioning for future optimal prosthetic fit, discussed positioning in bed and wheelchair. Pt reports he would like to try hopping up steps using railing and not RW, to be practiced in later therapy session.    Recommendations for other services: None  Discharge Criteria: Patient will be discharged from PT if patient refuses treatment 3 consecutive times without medical reason, if treatment goals not met, if there is a change in medical status, if patient makes no progress towards goals or if patient is discharged from hospital.  The above assessment, treatment plan, treatment alternatives and goals were discussed and mutually agreed upon: by patient  Wilhemina Bonito 03/24/2012, 5:37 PM

## 2012-03-24 NOTE — Progress Notes (Signed)
Report called to [place], RN on [phone]. Pain medications given to pt prior to transfer. Pt agreeable. Pt transferred to [id number] via wheelchair in stable condition

## 2012-03-24 NOTE — Plan of Care (Signed)
Overall Plan of Care [facility]) Patient Details Name: Trevor Thompson MRN: [id number] DOB: 09-Dec-1960  Diagnosis:    Primary Diagnosis:    S/P BKA (below knee amputation) Co-morbidities: dm, htn, renal txplant  Functional Problem List  Patient demonstrates impairments in the following areas: Balance, Edema, Medication Management, Nutrition, Pain, Safety and Sensory   Basic ADL's: bathing, dressing and toileting Advanced ADL's: not applicable at this time  Transfers:  bed mobility, bed to chair, toilet, tub/shower, car, furniture and floor Locomotion:  ambulation, wheelchair mobility and stairs  Additional Impairments:  None  Anticipated Outcomes Item Anticipated Outcome  Eating/Swallowing  Min assist  Basic self-care  Mod I  Toileting  Mod I  Bowel/Bladder  Independent  Transfers  Modified Independent  Locomotion  Supervision/ Modified Independent  Communication    Cognition    Pain  Min assist  Safety/Judgment  Independent  Other     Therapy Plan: PT Frequency: 2-3 X/day, 60-90 minutes OT Frequency: 1-2 X/day, 60-90 minutes;5 out of 7 days     Team Interventions: Item RN PT OT SLP SW TR Other  Self Care/Advanced ADL Retraining   x      Neuromuscular Re-Education  x       Therapeutic Activities  x x      UE/LE Strength Training/ROM  x x      UE/LE Coordination Activities   x      Visual/Perceptual Remediation/Compensation         DME/Adaptive Equipment Instruction  x x      Therapeutic Exercise  x x      Balance/Vestibular Training  x x      Patient/Family Education  x x      Cognitive Remediation/Compensation         Functional Mobility Training  x x      Ambulation/Gait Training  x       Museum/gallery curator  x       Wheelchair Propulsion/Positioning  x       Functional Tourist information centre manager Reintegration  x       Dysphagia/Aspiration Film/video editor         Bladder Management         Bowel  Management         Disease Management/Prevention  x       Pain Management  x       Medication Management         Skin Care/Wound Management  x       Splinting/Orthotics         Discharge Planning  x x      Psychosocial Support  x                          Team Discharge Planning: Destination:  Home Projected Follow-up:  PT and Home Health Projected Equipment Needs:  ? Water engineer, tub bench Patient/family involved in discharge planning:  Yes  MD ELOS: one week Medical Rehab Prognosis:  Excellent Assessment: pt admitted for cir therapies. The team will be addressing self-care, ROM, pre-pros ed, safety,fxnl mobility. Goals are mod I.

## 2012-03-24 NOTE — Progress Notes (Signed)
ANTICOAGULATION CONSULT NOTE - Follow up  Pharmacy Consult for Coumadin Indication: VTE prophylaxis  Allergies  Allergen Reactions  . Penicillins Other (See Comments)    Since birth    Vital Signs: Temp: 98.4 F (36.9 C) (09/20 0600) BP: 100/55 mmHg (09/20 0600) Pulse Rate: 90  (09/20 0600)  Labs:  Basename 03/24/12 0550 03/23/12 0855  HGB 11.1* --  HCT 34.6* --  PLT 186 --  APTT -- --  LABPROT 15.1 14.3  INR 1.21 1.13  HEPARINUNFRC -- --  CREATININE 1.24 --  CKTOTAL -- --  CKMB -- --  TROPONINI -- --    The CrCl is unknown because both a height and weight (above a minimum accepted value) are required for this calculation.   Medical History: Past Medical History  Diagnosis Date  . Type 1 diabetes mellitus, uncontrolled   . Hypertension   . Renal disorder   . PVD (peripheral vascular disease)   . S/p cadaver renal transplant   . Cold intolerance   . 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 GERD (gastroesophageal reflux disease)   . 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 Hypertriglyceridemia   . Diabetic retinopathy     Medications:  Prescriptions prior to admission  Medication Sig Dispense Refill  . esomeprazole (NEXIUM) 40 MG capsule Take 40 mg by mouth daily before breakfast.      . gabapentin (NEURONTIN) 300 MG capsule Take 1 capsule (300 mg total) by mouth 3 (three) times daily.  90 capsule  0  . insulin aspart (NOVOLOG) 100 UNIT/ML injection Inject 12 Units into the skin 3 (three) times daily before meals.  1 vial  0  . insulin glargine (LANTUS) 100 UNIT/ML injection Inject 32 Units into the skin 2 (two) times daily.      . metoprolol tartrate (LOPRESSOR) 25 MG tablet Take 25 mg by mouth 2 (two) times daily.      . mycophenolate (MYFORTIC) 360 MG TBEC Take 360 mg by mouth 2 (two) times daily.       Marland Kitchen omega-3 acid ethyl esters (LOVAZA) 1 G capsule Take 2 g by mouth 2 (two) times daily.      . predniSONE (DELTASONE) 5 MG tablet Take 5 mg by mouth  daily.      . rosuvastatin (CRESTOR) 5 MG tablet Take 5 mg by mouth daily.      . tacrolimus (PROGRAF) 1 MG capsule Take 4 mg by mouth 2 (two) times daily.        Assessment: 51 y/o male patient admitted with gangrenous left foot s/p left BKA requiring coumadin for dvt prophylaxis. INR baseline wnl, INR trend up.  Based upon CHEST guidelines, patient is at low risk for VTE, recommended to just use lovenox while hospitalized and discontinue at discharge.  Goal of Therapy:  INR 2-3 Monitor platelets by anticoagulation protocol: Yes   Plan:  Repeat Coumadin 2.5mg  today and f/u daily protime.  Verlene Mayer, PharmD, BCPS Pager [phone] 03/24/2012,10:26 AM

## 2012-03-24 NOTE — Progress Notes (Signed)
Patient was assessed and accepted by CIR and d/c'd to CIR today. SNF option did not have to be presented to patient.  Thanks to [facility] for facilitating d/c to CIR.  CSW signing off.  Lupita Leash T. [place] Pugh  [phone]

## 2012-03-24 NOTE — H&P (View-Only) (Signed)
Physical Medicine and Rehabilitation Admission H&P    No chief complaint on file. : HPI: Trevor Thompson is a 51 y.o. right-handed male with history of diabetes mellitus with peripheral neuropathy, renal transplant 2004, recent left second toe amputation secondary to peripheral vascular disease 02/25/2012. Patient was independent prior to admission. Admitted 03/22/2012 with nonhealing left foot and no change with conservative care. Limb was not felt to be salvageable. 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 Duda. Placed on Coumadin for DVT prophylaxis. Postoperative pain control. Physical and occupational therapy evaluations completed an ongoing. M.D. is requested physical medicine rehabilitation consult to consider inpatient rehabilitation services . Patient was felt to be a good candidate for inpatient rehabilitation services and was admitted for comprehensive rehabilitation program   Review of Systems  HENT:  Decrease in vision secondary to diabetic retinopathy  Musculoskeletal: Positive for myalgias and joint pain.  Neurological: Positive for tingling.  All other systems reviewed and are negative   Past Medical History  Diagnosis Date  . Type 1 diabetes mellitus, uncontrolled   . Hypertension   . Renal disorder   . PVD (peripheral vascular disease)   . S/p cadaver renal transplant   . Cold intolerance   . 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 GERD (gastroesophageal reflux disease)   . 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 Hypertriglyceridemia   . Diabetic retinopathy    Past Surgical History  Procedure Date  . Kidney transplant 2004    right  . Cataract extraction w/ intraocular lens  implant, bilateral ~ 1997  . Av fistula placement 1997-2004    "I've got 3; right upper arm; right forearm; left forearm "  . Av fistula repair 1997-2004    "multiple"  . Amputation 02/25/2012    Procedure: AMPUTATION DIGIT;  Surgeon: Todd F Early, MD;  Location: [facility] OR;   Service: Vascular;  Laterality: Left;  Left 2nd Toe Amputation, Incision and Drainage left lateral foot.  . Amputation 03/22/2012    Procedure: AMPUTATION BELOW KNEE;  Surgeon: [name] Duda, MD;  Location: [facility] OR;  Service: Orthopedics;  Laterality: Left;  Left Below Knee Amputation   Family History  Problem Relation Age of Onset  . Diabetes Mother   . Diabetes Father   . Lupus Sister    Social History:  reports that he quit smoking about 30 years ago. His smoking use included Cigarettes. He has a 1.5 pack-year smoking history. He has never used smokeless tobacco. He reports that he drinks about .6 ounces of alcohol per week. His drug history not on file. Allergies:  Allergies  Allergen Reactions  . Penicillins Other (See Comments)    Since birth   Medications Prior to Admission  Medication Sig Dispense Refill  . esomeprazole (NEXIUM) 40 MG capsule Take 40 mg by mouth daily before breakfast.      . gabapentin (NEURONTIN) 300 MG capsule Take 1 capsule (300 mg total) by mouth 3 (three) times daily.  90 capsule  0  . insulin aspart (NOVOLOG) 100 UNIT/ML injection Inject 12 Units into the skin 3 (three) times daily before meals.  1 vial  0  . insulin glargine (LANTUS) 100 UNIT/ML injection Inject 32 Units into the skin 2 (two) times daily.      . metoprolol tartrate (LOPRESSOR) 25 MG tablet Take 25 mg by mouth 2 (two) times daily.      . mycophenolate (MYFORTIC) 360 MG TBEC Take 360 mg by mouth 2 (two) times daily.       .   omega-3 acid ethyl esters (LOVAZA) 1 G capsule Take 2 g by mouth 2 (two) times daily.      . predniSONE (DELTASONE) 5 MG tablet Take 5 mg by mouth daily.      . rosuvastatin (CRESTOR) 5 MG tablet Take 5 mg by mouth daily.      . tacrolimus (PROGRAF) 1 MG capsule Take 4 mg by mouth 2 (two) times daily.        Home: Home Living Lives With: Spouse Available Help at Discharge: Family;Available 24 hours/day Type of Home: House Home Access: Stairs to enter Entrance  Stairs-Number of Steps: 2 Entrance Stairs-Rails: None Home Layout: 1/2 bath on main level;Two level Alternate Level Stairs-Number of Steps: 14 Alternate Level Stairs-Rails: Right Bathroom Shower/Tub: Tub/shower unit Bathroom Toilet: Standard Bathroom Accessibility: Yes How Accessible: Accessible via walker Home Adaptive Equipment: None   Functional History: Prior Function Able to Take Stairs?: Yes Driving: No Vocation: On disability  Functional Status:  Mobility: Bed Mobility Bed Mobility: Not assessed Supine to Sit: 5: Supervision Transfers Transfers: Sit to Stand;Stand to Sit Sit to Stand: 4: Min assist;From chair/3-in-1;With armrests;With upper extremity assist (decreased balance when standing ) Stand to Sit: 6: Modified independent (Device/Increase time);To chair/3-in-1;With upper extremity assist;With armrests Ambulation/Gait Ambulation/Gait Assistance: 4: Min assist Ambulation Distance (Feet): 20 Feet Assistive device: Rolling walker Ambulation/Gait Assistance Details: (A) to maintain balance with cues for hand placement and RW placement  Gait Pattern: Step-to pattern;Antalgic    ADL: ADL Eating/Feeding: Performed;Set up Where Assessed - Eating/Feeding: Chair Toilet Transfer: Simulated Toilet Transfer Method: Sit to stand Toilet Transfer Equipment: Raised toilet seat with arms (or 3-in-1 over toilet) Equipment Used: Gait belt;Rolling walker Transfers/Ambulation Related to ADLs: Pt. demonstrated decreased balance when transfering sit<>stand,stating he forgot he didn't have his left leg anymore.  ADL Comments: Pt. educated on limb desensitization and to check wrapping every 4 hours to make sure it does not come loose. Pt.educated to keep leg elevated and knee straight to prevent contractures as well as using his UE's to push up from the chair to increase UE strength for mobility during ADL tasks.   Cognition: Cognition Arousal/Alertness: Awake/alert Orientation  Level: Oriented X4 Cognition Overall Cognitive Status: Appears within functional limits for tasks assessed/performed Arousal/Alertness: Awake/alert Orientation Level: Appears intact for tasks assessed Behavior During Session: WFL for tasks performed   Blood pressure 100/55, pulse 90, temperature 98.4 F (36.9 C), temperature source Oral, resp. rate 18, SpO2 96.00%. Physical Exam  Vitals reviewed.  Constitutional: He is oriented to person, place, and time.  HENT:  Head: Normocephalic.  Eyes:  Pupils are sluggish to light but reactive  Neck: Neck supple. No thyromegaly present.  Cardiovascular: Normal rate and regular rhythm.no murmurs  Pulmonary/Chest: Breath sounds normal. He has no wheezes. No rales Abdominal: Bowel sounds are normal. He exhibits no distension. There is no tenderness.  Neurological: He is alert and oriented to person, place, and time. CN exam normal Skin:  Amputation sites dressed. Skin is quite dry on right foot/leg Psychiatric: He has a normal mood and affect.  Motor strength is 5/5 in bilateral deltoid, biceps, triceps, grip  Right hip flexor knee extensor ankle dorsiflexor 5/5  Left hip flexor 3-4/5 (pain)knee flexor extensor 3/5 secondary to range of motion(pain)  Extremity has coban wrap on left stump   Results for orders placed during the hospital encounter of 03/22/12 (from the past 48 hour(s))  GLUCOSE, CAPILLARY     Status: Abnormal   Collection Time   03/22/12    9:47 AM      Component Value Range Comment   Glucose-Capillary 170 (*) 70 - 99 mg/dL   GLUCOSE, CAPILLARY     Status: Abnormal   Collection Time   03/22/12 10:56 AM      Component Value Range Comment   Glucose-Capillary 170 (*) 70 - 99 mg/dL    Comment 1 Notify RN     GLUCOSE, CAPILLARY     Status: Abnormal   Collection Time   03/22/12  3:58 PM      Component Value Range Comment   Glucose-Capillary 174 (*) 70 - 99 mg/dL    Comment 1 Notify RN     GLUCOSE, CAPILLARY     Status: Abnormal    Collection Time   03/22/12  5:54 PM      Component Value Range Comment   Glucose-Capillary 149 (*) 70 - 99 mg/dL    Comment 1 Notify RN     GLUCOSE, CAPILLARY     Status: Abnormal   Collection Time   03/22/12  9:45 PM      Component Value Range Comment   Glucose-Capillary 101 (*) 70 - 99 mg/dL    Comment 1 Notify RN     GLUCOSE, CAPILLARY     Status: Normal   Collection Time   03/23/12  6:42 AM      Component Value Range Comment   Glucose-Capillary 99  70 - 99 mg/dL   PROTIME-INR     Status: Normal   Collection Time   03/23/12  8:55 AM      Component Value Range Comment   Prothrombin Time 14.3  11.6 - 15.2 seconds    INR 1.13  0.00 - 1.49   GLUCOSE, CAPILLARY     Status: Normal   Collection Time   03/23/12 11:00 AM      Component Value Range Comment   Glucose-Capillary 74  70 - 99 mg/dL    Comment 1 Documented in Chart      Comment 2 Notify RN     GLUCOSE, CAPILLARY     Status: Abnormal   Collection Time   03/23/12  4:24 PM      Component Value Range Comment   Glucose-Capillary 125 (*) 70 - 99 mg/dL    Comment 1 Documented in Chart      Comment 2 Notify RN     GLUCOSE, CAPILLARY     Status: Abnormal   Collection Time   03/23/12  9:28 PM      Component Value Range Comment   Glucose-Capillary 120 (*) 70 - 99 mg/dL    Comment 1 Notify RN     PROTIME-INR     Status: Normal   Collection Time   03/24/12  5:50 AM      Component Value Range Comment   Prothrombin Time 15.1  11.6 - 15.2 seconds    INR 1.21  0.00 - 1.49   BASIC METABOLIC PANEL     Status: Abnormal   Collection Time   03/24/12  5:50 AM      Component Value Range Comment   Sodium 135  135 - 145 mEq/L    Potassium 4.4  3.5 - 5.1 mEq/L    Chloride 102  96 - 112 mEq/L    CO2 26  19 - 32 mEq/L    Glucose, Bld 90  70 - 99 mg/dL    BUN 21  6 - 23 mg/dL    Creatinine, Ser 1.24  0.50 - 1.35   mg/dL    Calcium 9.2  8.4 - 10.5 mg/dL    GFR calc non Af Amer 65 (*) >90 mL/min    GFR calc Af Amer 76 (*) >90 mL/min   CBC      Status: Abnormal   Collection Time   03/24/12  5:50 AM      Component Value Range Comment   WBC 15.8 (*) 4.0 - 10.5 K/uL    RBC 4.07 (*) 4.22 - 5.81 MIL/uL    Hemoglobin 11.1 (*) 13.0 - 17.0 g/dL    HCT 34.6 (*) 39.0 - 52.0 %    MCV 85.0  78.0 - 100.0 fL    MCH 27.3  26.0 - 34.0 pg    MCHC 32.1  30.0 - 36.0 g/dL    RDW 14.0  11.5 - 15.5 %    Platelets 186  150 - 400 K/uL   GLUCOSE, CAPILLARY     Status: Normal   Collection Time   03/24/12  6:58 AM      Component Value Range Comment   Glucose-Capillary 90  70 - 99 mg/dL    No results found.  Post Admission Physician Evaluation: 1. Functional deficits secondary  to left below knee amputation. 2. Patient is admitted to receive collaborative, interdisciplinary care between the physiatrist, rehab nursing staff, and therapy team. 3. Patient's level of medical complexity and substantial therapy needs in context of that medical necessity cannot be provided at a lesser intensity of care such as a SNF. 4. Patient has experienced substantial functional loss from his/her baseline which was documented above under the "Functional History" and "Functional Status" headings.  Judging by the patient's diagnosis, physical exam, and functional history, the patient has potential for functional progress which will result in measurable gains while on inpatient rehab.  These gains will be of substantial and practical use upon discharge  in facilitating mobility and self-care at the household level. 5. Physiatrist will provide 24 hour management of medical needs as well as oversight of the therapy plan/treatment and provide guidance as appropriate regarding the interaction of the two. 6. 24 hour rehab nursing will assist with bladder management, bowel management, safety, skin/wound care, disease management, medication administration, pain management and patient education  and help integrate therapy concepts, techniques,education, etc. 7. PT will assess and treat  for:  fxnl mobility, pre-prosthetic ed, safety, adaptive equipment.  Goals are: mod I. 8. OT will assess and treat for: UES, fxnl mobility, ADL's, .   Goals are: mod I. 9. SLP will assess and treat for: n/a.  Goals are: n/a. 10. Case Management and Social Worker will assess and treat for psychological issues and discharge planning. 11. Team conference will be held weekly to assess progress toward goals and to determine barriers to discharge. 12. Patient will receive at least 3 hours of therapy per day at least 5 days per week. 13. ELOS: one week      Prognosis:  excellent   Medical Problem List and Plan: 1. Left below-knee amputation secondary to peripheral vascular disease 03/22/2012 2. DVT Prophylaxis/Anticoagulation: Coumadin for DVT prophylaxis. Monitor for any bleeding episodes 3. Pain Management: Oxycodone as well as Robaxin as needed. Neurontin 400 mg 3 times a day. Will titrate medications 4. Neuropsych: This patient is capable of making decisions on his/her own behalf. 5. History of renal transplant. Continue immunosuppressant medication 6. 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 Hemoglobin A1c of 11.2. Lantus insulin 32 units twice a day. Check blood sugars a.c. and   at bedtime 7. Hypertension. Lopressor 25 mg twice a day. Monitor with increased mobility 8. Hyperlipidemia. Lovaza, Lipitor  03/24/2012, Zach Swartz, MD 

## 2012-03-24 NOTE — Progress Notes (Signed)
Physical Therapy Treatment Patient Details Name: Trevor Thompson MRN: [id number] DOB: May 30, 1961 Today's Date: 03/24/2012 Time: 0850-0910 PT Time Calculation (min): 20 min  PT Assessment / Plan / Recommendation Comments on Treatment Session  Pt continues to need (A) to perform ambulation and transfers.  Pt able to review HEP.Marland Kitchen  Pt very motivated and willing to go to inpatient rehab.  Continue to highly recommend inpatient rehab for further strengthening, balance and prepare for prosthesis.    Follow Up Recommendations  Inpatient Rehab    Barriers to Discharge  None      Equipment Recommendations  Rolling walker with 5" wheels    Recommendations for Other Services Rehab consult  Frequency Min 3X/week   Plan Discharge plan remains appropriate    Precautions / Restrictions Precautions Precautions: Fall Restrictions Weight Bearing Restrictions: Yes LLE Weight Bearing: Non weight bearing   Pertinent Vitals/Pain C/o phantom pain and overall mm soreness rating 8/10 left LE pain    Mobility  Bed Mobility Bed Mobility: Supine to Sit Supine to Sit: 5: Supervision Details for Bed Mobility Assistance: Supervision for safety Transfers Transfers: Sit to Stand;Stand to Sit Sit to Stand: 4: Min assist;From chair/3-in-1;With armrests;With upper extremity assist Stand to Sit: 4: Min assist;To toilet;With armrests Details for Transfer Assistance: (A) to initiate transfer and slowly descend to recliner.  Pt needed 2 attempts to stand and needed max VCs for hand and LE placement. Ambulation/Gait Ambulation/Gait Assistance: 4: Min assist Ambulation Distance (Feet): 20 Feet Assistive device: Rolling walker Ambulation/Gait Assistance Details: (A) to maintain balance with cues for hand placement and RW placement  Gait Pattern: Step-to pattern;Antalgic Stairs: No    Exercises Amputee Exercises Quad Sets: AAROM;Strengthening;Left;10 reps Gluteal Sets: Strengthening;Both;5 reps Hip  ABduction/ADduction: AAROM;Strengthening;Left;10 reps Straight Leg Raises: Strengthening;Left;10 reps Chair Push Up: Both;5 reps   PT Diagnosis:    PT Problem List:   PT Treatment Interventions:     PT Goals Acute Rehab PT Goals PT Goal Formulation: With patient Time For Goal Achievement: 03/30/12 Potential to Achieve Goals: Good Pt will go Supine/Side to Sit: with modified independence PT Goal: Supine/Side to Sit - Progress: Progressing toward goal Pt will go Sit to Stand: with modified independence PT Goal: Sit to Stand - Progress: Progressing toward goal Pt will go Stand to Sit: with modified independence PT Goal: Stand to Sit - Progress: Progressing toward goal Pt will Ambulate: 51 - 150 feet;with rolling walker;with supervision PT Goal: Ambulate - Progress: Progressing toward goal  Visit Information  Last PT Received On: 03/24/12 Assistance Needed: +1    Subjective Data  Subjective: "I'm really sore today." Patient Stated Goal: To go to rehab   Cognition  Overall Cognitive Status: Appears within functional limits for tasks assessed/performed Arousal/Alertness: Awake/alert Orientation Level: Appears intact for tasks assessed Behavior During Session: [facility] for tasks performed    Balance     End of Session PT - End of Session Equipment Utilized During Treatment: Gait belt Activity Tolerance: Patient tolerated treatment well Patient left: in chair;with call bell/phone within reach Nurse Communication: Mobility status;Precautions   GP     Muntaha Vermette 03/24/2012, 9:46 AM Jake Shark, PT DPT [phone]

## 2012-03-24 NOTE — Progress Notes (Signed)
1450 Pt. Arrived to rehab 4100 from 5N08.  Pt. AOX3, VSS, no complaints of pain upon admission.  Family at bedside; pt. Oriented to rehab.

## 2012-03-24 NOTE — H&P (Signed)
Physical Medicine and Rehabilitation Admission H&P    No chief complaint on file. : HPI: Trevor Thompson is a 51 y.o. right-handed male with history of diabetes mellitus with peripheral neuropathy, renal transplant 2004, recent left second toe amputation secondary to peripheral vascular disease 02/25/2012. Patient was independent prior to admission. Admitted 03/22/2012 with nonhealing left foot and no change with conservative care. Limb was not felt to be salvageable. 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 Lajoyce Corners. Placed on Coumadin for DVT prophylaxis. Postoperative pain control. Physical and occupational therapy evaluations completed an ongoing. M.D. is requested physical medicine rehabilitation consult to consider inpatient rehabilitation services . Patient was felt to be a good candidate for inpatient rehabilitation services and was admitted for comprehensive rehabilitation program   Review of Systems  HENT:  Decrease in vision secondary to diabetic retinopathy  Musculoskeletal: Positive for myalgias and joint pain.  Neurological: Positive for tingling.  All other systems reviewed and are negative   Past Medical History  Diagnosis Date  . Type 1 diabetes mellitus, uncontrolled   . Hypertension   . Renal disorder   . PVD (peripheral vascular disease)   . S/p cadaver renal transplant   . Cold intolerance   . 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 GERD (gastroesophageal reflux disease)   . 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 Hypertriglyceridemia   . Diabetic retinopathy    Past Surgical History  Procedure Date  . Kidney transplant 2004    right  . Cataract extraction w/ intraocular lens  implant, bilateral ~ 1997  . Av fistula placement 1997-2004    "I've got 3; right upper arm; right forearm; left forearm "  . Av fistula repair 1997-2004    "multiple"  . Amputation 02/25/2012    Procedure: AMPUTATION DIGIT;  Surgeon: Larina Earthly, MD;  Location: [facility] OR;   Service: Vascular;  Laterality: Left;  Left 2nd Toe Amputation, Incision and Drainage left lateral foot.  . Amputation 03/22/2012    Procedure: AMPUTATION BELOW KNEE;  Surgeon: Nadara Mustard, MD;  Location: [facility] OR;  Service: Orthopedics;  Laterality: Left;  Left Below Knee Amputation   Family History  Problem Relation Age of Onset  . Diabetes Mother   . Diabetes Father   . Lupus Sister    Social History:  reports that he quit smoking about 30 years ago. His smoking use included Cigarettes. He has a 1.5 pack-year smoking history. He has never used smokeless tobacco. He reports that he drinks about .6 ounces of alcohol per week. His drug history not on file. Allergies:  Allergies  Allergen Reactions  . Penicillins Other (See Comments)    Since birth   Medications Prior to Admission  Medication Sig Dispense Refill  . esomeprazole (NEXIUM) 40 MG capsule Take 40 mg by mouth daily before breakfast.      . gabapentin (NEURONTIN) 300 MG capsule Take 1 capsule (300 mg total) by mouth 3 (three) times daily.  90 capsule  0  . insulin aspart (NOVOLOG) 100 UNIT/ML injection Inject 12 Units into the skin 3 (three) times daily before meals.  1 vial  0  . insulin glargine (LANTUS) 100 UNIT/ML injection Inject 32 Units into the skin 2 (two) times daily.      . metoprolol tartrate (LOPRESSOR) 25 MG tablet Take 25 mg by mouth 2 (two) times daily.      . mycophenolate (MYFORTIC) 360 MG TBEC Take 360 mg by mouth 2 (two) times daily.       Marland Kitchen  omega-3 acid ethyl esters (LOVAZA) 1 G capsule Take 2 g by mouth 2 (two) times daily.      . predniSONE (DELTASONE) 5 MG tablet Take 5 mg by mouth daily.      . rosuvastatin (CRESTOR) 5 MG tablet Take 5 mg by mouth daily.      . tacrolimus (PROGRAF) 1 MG capsule Take 4 mg by mouth 2 (two) times daily.        Home: Home Living Lives With: Spouse Available Help at Discharge: Family;Available 24 hours/day Type of Home: House Home Access: Stairs to enter [employer] of Steps: 2 Entrance Stairs-Rails: None Home Layout: 1/2 bath on main level;Two level Alternate Level Stairs-Number of Steps: 14 Alternate Level Stairs-Rails: Right Bathroom Shower/Tub: Engineer, manufacturing systems: Standard Bathroom Accessibility: Yes How Accessible: Accessible via walker Home Adaptive Equipment: None   Functional History: Prior Function Able to Take Stairs?: Yes Driving: No Vocation: On disability  Functional Status:  Mobility: Bed Mobility Bed Mobility: Not assessed Supine to Sit: 5: Supervision Transfers Transfers: Sit to Stand;Stand to Sit Sit to Stand: 4: Min assist;From chair/3-in-1;With armrests;With upper extremity assist (decreased balance when standing ) Stand to Sit: 6: Modified independent (Device/Increase time);To chair/3-in-1;With upper extremity assist;With armrests Ambulation/Gait Ambulation/Gait Assistance: 4: Min assist Ambulation Distance (Feet): 20 Feet Assistive device: Rolling walker Ambulation/Gait Assistance Details: (A) to maintain balance with cues for hand placement and RW placement  Gait Pattern: Step-to pattern;Antalgic    ADL: ADL Eating/Feeding: Performed;Set up Where Assessed - Eating/Feeding: Chair Toilet Transfer: Simulated Toilet Transfer Method: Sit to stand Toilet Transfer Equipment: Raised toilet seat with arms (or 3-in-1 over toilet) Equipment Used: Gait belt;Rolling walker Transfers/Ambulation Related to ADLs: Pt. demonstrated decreased balance when transfering sit<>stand,stating he forgot he didn't have his left leg anymore.  ADL Comments: Pt. educated on limb desensitization and to check wrapping every 4 hours to make sure it does not come loose. [email or web address] to keep leg elevated and knee straight to prevent contractures as well as using his UE's to push up from the chair to increase UE strength for mobility during ADL tasks.   Cognition: Cognition Arousal/Alertness: Awake/alert Orientation  Level: Oriented X4 Cognition Overall Cognitive Status: Appears within functional limits for tasks assessed/performed Arousal/Alertness: Awake/alert Orientation Level: Appears intact for tasks assessed Behavior During Session: [facility] for tasks performed   Blood pressure 100/55, pulse 90, temperature 98.4 F (36.9 C), temperature source Oral, resp. rate 18, SpO2 96.00%. Physical Exam  Vitals reviewed.  Constitutional: He is oriented to person, place, and time.  HENT:  Head: Normocephalic.  Eyes:  Pupils are sluggish to light but reactive  Neck: Neck supple. No thyromegaly present.  Cardiovascular: Normal rate and regular rhythm.no murmurs  Pulmonary/Chest: Breath sounds normal. He has no wheezes. No rales Abdominal: Bowel sounds are normal. He exhibits no distension. There is no tenderness.  Neurological: He is alert and oriented to person, place, and time. CN exam normal Skin:  Amputation sites dressed. Skin is quite dry on right foot/leg Psychiatric: He has a normal mood and affect.  Motor strength is 5/5 in bilateral deltoid, biceps, triceps, grip  Right hip flexor knee extensor ankle dorsiflexor 5/5  Left hip flexor 3-4/5 (pain)knee flexor extensor 3/5 secondary to range of motion(pain)  Extremity has coban wrap on left stump   Results for orders placed during the hospital encounter of 03/22/12 (from the past 48 hour(s))  GLUCOSE, CAPILLARY     Status: Abnormal   Collection Time   03/22/12  9:47 AM      Component Value Range Comment   Glucose-Capillary 170 (*) 70 - 99 mg/dL   GLUCOSE, CAPILLARY     Status: Abnormal   Collection Time   03/22/12 10:56 AM      Component Value Range Comment   Glucose-Capillary 170 (*) 70 - 99 mg/dL    Comment 1 Notify RN     GLUCOSE, CAPILLARY     Status: Abnormal   Collection Time   03/22/12  3:58 PM      Component Value Range Comment   Glucose-Capillary 174 (*) 70 - 99 mg/dL    Comment 1 Notify RN     GLUCOSE, CAPILLARY     Status: Abnormal    Collection Time   03/22/12  5:54 PM      Component Value Range Comment   Glucose-Capillary 149 (*) 70 - 99 mg/dL    Comment 1 Notify RN     GLUCOSE, CAPILLARY     Status: Abnormal   Collection Time   03/22/12  9:45 PM      Component Value Range Comment   Glucose-Capillary 101 (*) 70 - 99 mg/dL    Comment 1 Notify RN     GLUCOSE, CAPILLARY     Status: Normal   Collection Time   03/23/12  6:42 AM      Component Value Range Comment   Glucose-Capillary 99  70 - 99 mg/dL   PROTIME-INR     Status: Normal   Collection Time   03/23/12  8:55 AM      Component Value Range Comment   Prothrombin Time 14.3  11.6 - 15.2 seconds    INR 1.13  0.00 - 1.49   GLUCOSE, CAPILLARY     Status: Normal   Collection Time   03/23/12 11:00 AM      Component Value Range Comment   Glucose-Capillary 74  70 - 99 mg/dL    Comment 1 Documented in Chart      Comment 2 Notify RN     GLUCOSE, CAPILLARY     Status: Abnormal   Collection Time   03/23/12  4:24 PM      Component Value Range Comment   Glucose-Capillary 125 (*) 70 - 99 mg/dL    Comment 1 Documented in Chart      Comment 2 Notify RN     GLUCOSE, CAPILLARY     Status: Abnormal   Collection Time   03/23/12  9:28 PM      Component Value Range Comment   Glucose-Capillary 120 (*) 70 - 99 mg/dL    Comment 1 Notify RN     PROTIME-INR     Status: Normal   Collection Time   03/24/12  5:50 AM      Component Value Range Comment   Prothrombin Time 15.1  11.6 - 15.2 seconds    INR 1.21  0.00 - 1.49   BASIC METABOLIC PANEL     Status: Abnormal   Collection Time   03/24/12  5:50 AM      Component Value Range Comment   Sodium 135  135 - 145 mEq/L    Potassium 4.4  3.5 - 5.1 mEq/L    Chloride 102  96 - 112 mEq/L    CO2 26  19 - 32 mEq/L    Glucose, Bld 90  70 - 99 mg/dL    BUN 21  6 - 23 mg/dL    Creatinine, Ser 1.61  0.50 - 1.35  mg/dL    Calcium 9.2  8.4 - 16.1 mg/dL    GFR calc non Af Amer 65 (*) >90 mL/min    GFR calc Af Amer 76 (*) >90 mL/min   CBC      Status: Abnormal   Collection Time   03/24/12  5:50 AM      Component Value Range Comment   WBC 15.8 (*) 4.0 - 10.5 K/uL    RBC 4.07 (*) 4.22 - 5.81 MIL/uL    Hemoglobin 11.1 (*) 13.0 - 17.0 g/dL    HCT 09.6 (*) 04.5 - 52.0 %    MCV 85.0  78.0 - 100.0 fL    MCH 27.3  26.0 - 34.0 pg    MCHC 32.1  30.0 - 36.0 g/dL    RDW 40.9  81.1 - 91.4 %    Platelets 186  150 - 400 K/uL   GLUCOSE, CAPILLARY     Status: Normal   Collection Time   03/24/12  6:58 AM      Component Value Range Comment   Glucose-Capillary 90  70 - 99 mg/dL    No results found.  Post Admission Physician Evaluation: 1. Functional deficits secondary  to left below knee amputation. 2. Patient is admitted to receive collaborative, interdisciplinary care between the physiatrist, rehab nursing staff, and therapy team. 3. Patient's level of medical complexity and substantial therapy needs in context of that medical necessity cannot be provided at a lesser intensity of care such as a SNF. 4. Patient has experienced substantial functional loss from his/her baseline which was documented above under the "Functional History" and "Functional Status" headings.  Judging by the patient's diagnosis, physical exam, and functional history, the patient has potential for functional progress which will result in measurable gains while on inpatient rehab.  These gains will be of substantial and practical use upon discharge  in facilitating mobility and self-care at the household level. 5. Physiatrist will provide 24 hour management of medical needs as well as oversight of the therapy plan/treatment and provide guidance as appropriate regarding the interaction of the two. 6. 24 hour rehab nursing will assist with bladder management, bowel management, safety, skin/wound care, disease management, medication administration, pain management and patient education  and help integrate therapy concepts, techniques,education, etc. 7. PT will assess and treat  for:  fxnl mobility, pre-prosthetic ed, safety, adaptive equipment.  Goals are: mod I. 8. OT will assess and treat for: UES, fxnl mobility, ADL's, .   Goals are: mod I. 9. SLP will assess and treat for: n/a.  Goals are: n/a. 10. Case Management and Social Worker will assess and treat for psychological issues and discharge planning. 11. Team conference will be held weekly to assess progress toward goals and to determine barriers to discharge. 12. Patient will receive at least 3 hours of therapy per day at least 5 days per week. 13. ELOS: one week      Prognosis:  excellent   Medical Problem List and Plan: 1. Left below-knee amputation secondary to peripheral vascular disease 03/22/2012 2. DVT Prophylaxis/Anticoagulation: Coumadin for DVT prophylaxis. Monitor for any bleeding episodes 3. Pain Management: Oxycodone as well as Robaxin as needed. Neurontin 400 mg 3 times a day. Will titrate medications 4. Neuropsych: This patient is capable of making decisions on his/her own behalf. 5. History of renal transplant. Continue immunosuppressant medication 6. 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 Hemoglobin A1c of 11.2. Lantus insulin 32 units twice a day. Check blood sugars a.c. and  at bedtime 7. Hypertension. Lopressor 25 mg twice a day. Monitor with increased mobility 8. Hyperlipidemia. Lovaza, Lipitor  03/24/2012, Ivory Broad, MD

## 2012-03-24 NOTE — Progress Notes (Signed)
I met with patient and his spouse at bedside. Both are in agreement to an inpt rehab admit today.Bed is available and we will admit today. RN has contacted Dr. Lajoyce Corners. [phone] with any questions.

## 2012-03-25 ENCOUNTER — Inpatient Hospital Stay (HOSPITAL_COMMUNITY): Payer: Medicare Other | Admitting: Physical Therapy

## 2012-03-25 ENCOUNTER — Inpatient Hospital Stay (HOSPITAL_COMMUNITY): Payer: Medicare Other | Admitting: Occupational Therapy

## 2012-03-25 DIAGNOSIS — I739 Peripheral vascular disease, unspecified: Secondary | ICD-10-CM

## 2012-03-25 DIAGNOSIS — L98499 Non-pressure chronic ulcer of skin of other sites with unspecified severity: Secondary | ICD-10-CM

## 2012-03-25 DIAGNOSIS — S88119A Complete traumatic amputation at level between knee and ankle, unspecified lower leg, initial encounter: Secondary | ICD-10-CM

## 2012-03-25 DIAGNOSIS — Z5189 Encounter for other specified aftercare: Secondary | ICD-10-CM

## 2012-03-25 LAB — GLUCOSE, CAPILLARY
Glucose-Capillary: 96 mg/dL (ref 70–99)
Glucose-Capillary: 68 mg/dL — ABNORMAL LOW (ref 70–99)
Glucose-Capillary: 101 mg/dL — ABNORMAL HIGH (ref 70–99)

## 2012-03-25 MED ORDER — WARFARIN SODIUM 5 MG PO TABS
5.0000 mg | ORAL_TABLET | Freq: Once | ORAL | Status: AC
  Administered 2012-03-25: 5 mg via ORAL
  Filled 2012-03-25: qty 1

## 2012-03-25 NOTE — Evaluation (Signed)
Occupational Therapy Assessment and Plan  Patient Details  Name: Trevor Thompson MRN: [id number] Date of Birth: 1961/03/12  OT Diagnosis: muscle weakness (generalized) Rehab Potential: Rehab Potential: Good ELOS: 7- 10 days   Today's Date: 03/25/2012 Time: 4098-1191 and 1500-1600 Time Calculation (min): 60 min and 60 min  Visit 1:  Pt seen for the initial evaluation and BADL retraining from chair level for bathing, bed level for dressing, pt worked on toilet transfers.  Discussed safe transfer techniques. Pt was having a great deal of difficulty with standing due to increased pain in RLE.  Visit 2: Pt seen for UE strengthening and activity tolerance training with UBE 10 min set at level 7, medicine ball shoulder/bicep/tricep exercises, and wheelchair pushups. Pt worked on left knee extension.  Pt was feeling too tired to work on transfers, but discussed tub bench set up and use and therapist demonstrated a tub bench transfer.  Pt will look into seeing if one of his relatives owns one.  Pt transferred back to bed with squat pivot with supervision.  Problem List:  Patient Active Problem List  Diagnosis  . Cellulitis of left foot  . Necrotic toe  .  ? PVD (peripheral vascular disease)  . History of renal transplant  . 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 Insulin dependent type 2 diabetes mellitus, uncontrolled  . HTN (hypertension)  . Dehydration as evidenced by hemoconcentration  . Left foot pain  . Type 1 diabetes mellitus, uncontrolled  . Hypertension  . S/p cadaver renal transplant  . Cold intolerance  . 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 GERD (gastroesophageal reflux disease)  . 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 Hypertriglyceridemia  . Diabetic retinopathy  . Gangrene Associated With Diabetes Mellitus  . Leukocytosis  . Autonomic orthostatic hypotension  . Acute kidney injury  . Fever  . S/P BKA (below knee amputation)    Past  Medical History:  Past Medical History  Diagnosis Date  . Type 1 diabetes mellitus, uncontrolled   . Hypertension   . Renal disorder   . PVD (peripheral vascular disease)   . S/p cadaver renal transplant   . Cold intolerance   . 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 GERD (gastroesophageal reflux disease)   . 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 Hypertriglyceridemia   . Diabetic retinopathy    Past Surgical History:  Past Surgical History  Procedure Date  . Kidney transplant 2004    right  . Cataract extraction w/ intraocular lens  implant, bilateral ~ 1997  . Av fistula placement 1997-2004    "I've got 3; right upper arm; right forearm; left forearm "  . Av fistula repair 1997-2004    "multiple"  . Amputation 02/25/2012    Procedure: AMPUTATION DIGIT;  Surgeon: Larina Earthly, MD;  Location: [facility] OR;  Service: Vascular;  Laterality: Left;  Left 2nd Toe Amputation, Incision and Drainage left lateral foot.  . Amputation 03/22/2012    Procedure: AMPUTATION BELOW KNEE;  Surgeon: Nadara Mustard, MD;  Location: [facility] OR;  Service: Orthopedics;  Laterality: Left;  Left Below Knee Amputation    Assessment & Plan Clinical Impression: Trevor Thompson is a 51 y.o. right-handed male with history of diabetes mellitus with peripheral neuropathy, renal transplant 2004, recent left second toe amputation secondary to peripheral vascular disease 02/25/2012. Patient was independent prior to admission. Admitted 03/22/2012 with nonhealing left foot and no change with conservative care. Limb was not felt to be salvageable.  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 Lajoyce Corners. Placed on Coumadin for DVT prophylaxis. Postoperative pain control.  Patient transferred to CIR on 03/24/2012 .    Patient currently requires max with basic self-care skills of toileting, min assist with ADL transfers secondary to muscle weakness and decreased standing balance.  Prior to hospitalization, patient could complete  all ADLs/ IADLs independently.  Patient will benefit from skilled intervention to increase independence with basic self-care skills prior to discharge home with care partner.  Anticipate patient will require intermittent supervision and no further OT follow recommended.  OT - End of Session Endurance Deficit: Yes Endurance Deficit Description: Decreased with standing tolerance. OT Assessment Rehab Potential: Good Barriers to Discharge: None OT Plan OT Frequency: 1-2 X/day, 60-90 minutes;5 out of 7 days Estimated Length of Stay: 7- 10 days OT Treatment/Interventions: Balance/vestibular training;Discharge planning;DME/adaptive equipment instruction;Functional mobility training;Patient/family education;Self Care/advanced ADL retraining;Therapeutic Activities;Therapeutic Exercise OT Recommendation Follow Up Recommendations: None Equipment Recommended: Tub/shower bench  OT Evaluation Precautions/Restrictions  Precautions Precautions: Fall Restrictions Weight Bearing Restrictions: Yes LLE Weight Bearing: Non weight bearing (LLE)      Pain Pain Assessment Pain Assessment: 0-10 Pain Score: Asleep Pain Type: Acute pain Pain Location: Leg Pain Orientation: Left Pain Descriptors: Throbbing Pain Frequency: Intermittent Pain Onset: Gradual Patients Stated Pain Goal: 8 Home Living/Prior Functioning Home Living Lives With: Spouse Available Help at Discharge: Family (wife will be able to provide 24hr supervision, no assist) Type of Home: House Home Access: Stairs to enter [employer] of Steps: 3 Entrance Stairs-Rails: None Home Layout: 1/2 bath on main level;Two level Alternate Level Stairs-Number of Steps: 14 rail on Lt. Alternate Level Stairs-Rails: Left (intermittant Rt. railing) Bathroom Shower/Tub: Tub/shower unit Bathroom Toilet: Standard Bathroom Accessibility: Yes How Accessible: Accessible via walker Home Adaptive Equipment: Walker - rolling;Straight  cane Prior Function Level of Independence: Independent with transfers;Independent with basic ADLs Able to Take Stairs?: Yes Driving: No Vocation: On disability Leisure: Hobbies-yes (Comment) Comments: watch football, play cards, play with grand kids ADL Refer to FIM   Vision/Perception  Vision - History Baseline Vision: Wears glasses only for reading Patient Visual Report: No change from baseline Vision - Assessment Eye Alignment: Within Functional Limits  Cognition Overall Cognitive Status: Appears within functional limits for tasks assessed Sensation Sensation Light Touch Impaired Details: Impaired RUE (in right hand) Stereognosis: Appears Intact Hot/Cold: Appears Intact Coordination Gross Motor Movements are Fluid and Coordinated: Yes Fine Motor Movements are Fluid and Coordinated: Yes Motor  Motor Motor: Within Functional Limits Mobility  Refer to FIM    Trunk/Postural Assessment  Cervical Assessment Cervical Assessment: Within Functional Limits Thoracic Assessment Thoracic Assessment: Within Functional Limits Lumbar Assessment Lumbar Assessment: Within Functional Limits Postural Control Postural Control: Within Functional Limits  Balance Balance Balance Assessed: Yes Static Sitting Balance Static Sitting - Level of Assistance: 5: Stand by assistance Static Standing Balance Static Standing - Level of Assistance: 4: Min assist Dynamic Standing Balance Dynamic Standing - Level of Assistance: 3: Mod assist Extremity/Trunk Assessment RUE Assessment RUE Assessment: Within Functional Limits LUE Assessment LUE Assessment: Within Functional Limits  See FIM for current functional status Refer to Care Plan for Long Term Goals  Recommendations for other services: None  Discharge Criteria: Patient will be discharged from OT if patient refuses treatment 3 consecutive times without medical reason, if treatment goals not met, if there is a change in medical status,  if patient makes no progress towards goals or if patient is discharged from hospital.  The above assessment,  treatment plan, treatment alternatives and goals were discussed and mutually agreed upon: by patient  [facility] 03/25/2012, 2:22 PM

## 2012-03-25 NOTE — Progress Notes (Signed)
Patient ID: Trevor Thompson, male   DOB: 1961-05-23, 51 y.o.   MRN: [id number] Trevor Thompson is a 51 y.o. right-handed male with history of diabetes mellitus with peripheral neuropathy, renal transplant 2004, recent left second toe amputation secondary to peripheral vascular disease 02/25/2012. Patient was independent prior to admission. Admitted 03/22/2012 with nonhealing left foot and no change with conservative care. Limb was not felt to be salvageable. 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 Lajoyce Corners. Placed on Coumadin for DVT prophylaxis  Subjective/Complaints: Do I need this IV? No pain c/o, occ phantom sensation Review of Systems  Musculoskeletal: Positive for myalgias.  Skin: Positive for itching.  Neurological: Positive for dizziness. Negative for sensory change.  All other systems reviewed and are negative.    Objective: Vital Signs: Blood pressure 137/81, pulse 101, temperature 98.7 F (37.1 C), temperature source Oral, resp. rate 18, height 5\' 11"  (1.803 m), weight 109.997 kg (242 lb 8 oz), SpO2 90.00%. No results found. Results for orders placed during the hospital encounter of 03/24/12 (from the past 72 hour(s))  GLUCOSE, CAPILLARY     Status: Abnormal   Collection Time   03/24/12  4:29 PM      Component Value Range Comment   Glucose-Capillary 141 (*) 70 - 99 mg/dL   GLUCOSE, CAPILLARY     Status: Normal   Collection Time   03/24/12  8:50 PM      Component Value Range Comment   Glucose-Capillary 87  70 - 99 mg/dL   PROTIME-INR     Status: Abnormal   Collection Time   03/25/12  6:50 AM      Component Value Range Comment   Prothrombin Time 15.6 (*) 11.6 - 15.2 seconds    INR 1.27  0.00 - 1.49   GLUCOSE, CAPILLARY     Status: Normal   Collection Time   03/25/12  7:38 AM      Component Value Range Comment   Glucose-Capillary 96  70 - 99 mg/dL      HEENT: normal Cardio: RRR Resp: CTA B/L GI: BS positive Extremity:  No Edema Skin:   Wound Coban wrap to be  removed by Ortho in 1 week Neuro: Alert/Oriented, Abnormal Sensory reduce R toes and Normal Motor Musc/Skel:  Other L BKA   Assessment/Plan: 1. Functional deficits secondary to L BKA due to diabetes and gangrene which require 3+ hours per day of interdisciplinary therapy in a comprehensive inpatient rehab setting. Physiatrist is providing close team supervision and 24 hour management of active medical problems listed below. Physiatrist and rehab team continue to assess barriers to discharge/monitor patient progress toward functional and medical goals. FIM:             FIM - Bed/Chair Transfer Bed/Chair Transfer: 5: Supine > Sit: Supervision (verbal cues/safety issues);5: Sit > Supine: Supervision (verbal cues/safety issues);3: Bed > Chair or W/C: Mod A (lift or lower assist);3: Chair or W/C > Bed: Mod A (lift or lower assist) (without device)  FIM - Locomotion: Wheelchair Distance: 150' Locomotion: Wheelchair: 5: Travels 150 ft or more: maneuvers on rugs and over door sills with supervision, cueing or coaxing FIM - Locomotion: Ambulation Locomotion: Ambulation Assistive Devices: Designer, industrial/product Ambulation/Gait Assistance: 3: Mod assist Locomotion: Ambulation: 1: Travels less than 50 ft with moderate assistance (Pt: 50 - 74%)  Comprehension Comprehension Mode: Auditory  Expression Expression Mode: Verbal Expression: 7-Expresses complex ideas: With no assist  Social Interaction Social Interaction: 7-Interacts appropriately with others - No medications needed.  Problem Solving Problem Solving: 7-Solves complex problems: Recognizes & self-corrects  Memory Memory: 7-Complete Independence: No helper   Medical Problem List and Plan:  1. Left below-knee amputation secondary to peripheral vascular disease 03/22/2012  2. DVT Prophylaxis/Anticoagulation: Coumadin for DVT prophylaxis. Monitor for any bleeding episodes  3. Pain Management: Oxycodone as well as Robaxin as needed.  Neurontin 400 mg 3 times a day. Will titrate medications  4. Neuropsych: This patient is capable of making decisions on his/her own behalf.  5. History of renal transplant. Continue immunosuppressant medication  6. 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 Hemoglobin A1c of 11.2. Lantus insulin 32 units twice a day. Check blood sugars a.c. and at bedtime  7. Hypertension. Lopressor 25 mg twice a day. Monitor with increased mobility  8. Hyperlipidemia. Lovaza, Lipitor   LOS (Days) 1 A FACE TO FACE EVALUATION WAS PERFORMED  KIRSTEINS,ANDREW E 03/25/2012, 11:08 AM

## 2012-03-25 NOTE — Progress Notes (Signed)
Physical Therapy Session Note  Patient Details  Name: Konner Saiz MRN: [id number] Date of Birth: 04-04-1961  Today's Date: 03/25/2012 Time: 0830-0930 Time Calculation (min): 60 min  Short Term Goals: Week 1:  PT Short Term Goal 1 (Week 1): = long term goals  Therapy Documentation Precautions:  Precautions: Fall, Left BKA Weight Bearing Restrictions: Yes LLE Weight Bearing: Non weight bearing Pain: Pain Assessment: No/denies pain at rest Pain Score: 0-No pain Pain Type: Acute pain Pain Location: Leg Pain Orientation: Left Pain Descriptors: Throbbing Pain Frequency: Intermittent Pain Onset: Gradual Patients Stated Pain Goal: 3  Therapeutic Activity:(15') Transfer training sit<->stand into RW and bed mobility with S/Mod-I Therapeutic Exercise:(30') B LE's in supine and in sitting Gait Training:(15') gait using RW 3 x 20' (patient c/o increased pain in residual limb when standing upright in RW at 8/10. Nursing notified).  See FIM for current functional status  Therapy/Group: Individual Therapy  Rex Kras 03/25/2012, 8:51 AM

## 2012-03-25 NOTE — Plan of Care (Signed)
Problem: RH BOWEL ELIMINATION Goal: RH STG MANAGE BOWEL W/MEDICATION W/ASSISTANCE STG Manage Bowel with Medication with Assistance.  Outcome: Not Progressing Refused to take PRN bowel medication. LBM 03/21/12  Problem: RH PAIN MANAGEMENT Goal: RH STG PAIN MANAGED AT OR BELOW PT'S PAIN GOAL Outcome: Progressing Utilizing Oxycodone 10mg  q4hr and scheduled medications for pain relief

## 2012-03-26 ENCOUNTER — Inpatient Hospital Stay (HOSPITAL_COMMUNITY): Payer: Medicare Other | Admitting: *Deleted

## 2012-03-26 LAB — GLUCOSE, CAPILLARY
Glucose-Capillary: 128 mg/dL — ABNORMAL HIGH (ref 70–99)
Glucose-Capillary: 52 mg/dL — ABNORMAL LOW (ref 70–99)

## 2012-03-26 LAB — PROTIME-INR
INR: 1.07 (ref 0.00–1.49)
Prothrombin Time: 13.8 seconds (ref 11.6–15.2)

## 2012-03-26 MED ORDER — WARFARIN SODIUM 7.5 MG PO TABS
7.5000 mg | ORAL_TABLET | Freq: Once | ORAL | Status: AC
  Administered 2012-03-26: 7.5 mg via ORAL
  Filled 2012-03-26: qty 1

## 2012-03-26 NOTE — Progress Notes (Signed)
Occupational Therapy Note  Patient Details  Name: Trevor Thompson MRN: [id number] Date of Birth: October 22, 1960 Today's Date: 03/26/2012  Individual therapy Time:  1530-1600  (30 min) Pain:  4/10 LLE  Engaged in UE exercises using 4 # weights, sit to stand and standing balance.  Pt. Stood for 1 minute while moving right arm all around his body and holding with left UE.  Used bed for posterior support.  Practiced sit to stand with supervision x10.     Humberto Seals 03/26/2012, 4:01 PM

## 2012-03-26 NOTE — Progress Notes (Signed)
Patient ID: Trevor Thompson, male   DOB: 1961/04/01, 51 y.o.   MRN: [id number] Trevor Thompson is a 51 y.o. right-handed male with history of diabetes mellitus with peripheral neuropathy, renal transplant 2004, recent left second toe amputation secondary to peripheral vascular disease 02/25/2012. Patient was independent prior to admission. Admitted 03/22/2012 with nonhealing left foot and no change with conservative care. Limb was not felt to be salvageable. 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 Lajoyce Corners. Placed on Coumadin for DVT prophylaxis  Subjective/Complaints:   pain c/o during therapy but not at rest  Review of Systems  Musculoskeletal: Positive for myalgias.  Skin: Positive for itching.  Neurological: Positive for dizziness. Negative for sensory change.  All other systems reviewed and are negative.    Objective: Vital Signs: Blood pressure 111/56, pulse 90, temperature 99.1 F (37.3 C), temperature source Oral, resp. rate 18, height 5\' 11"  (1.803 m), weight 109.997 kg (242 lb 8 oz), SpO2 94.00%. No results found. Results for orders placed during the hospital encounter of 03/24/12 (from the past 72 hour(s))  GLUCOSE, CAPILLARY     Status: Abnormal   Collection Time   03/24/12  4:29 PM      Component Value Range Comment   Glucose-Capillary 141 (*) 70 - 99 mg/dL   GLUCOSE, CAPILLARY     Status: Normal   Collection Time   03/24/12  8:50 PM      Component Value Range Comment   Glucose-Capillary 87  70 - 99 mg/dL   PROTIME-INR     Status: Abnormal   Collection Time   03/25/12  6:50 AM      Component Value Range Comment   Prothrombin Time 15.6 (*) 11.6 - 15.2 seconds    INR 1.27  0.00 - 1.49   GLUCOSE, CAPILLARY     Status: Normal   Collection Time   03/25/12  7:38 AM      Component Value Range Comment   Glucose-Capillary 96  70 - 99 mg/dL   GLUCOSE, CAPILLARY     Status: Abnormal   Collection Time   03/25/12 11:21 AM      Component Value Range Comment   Glucose-Capillary 68 (*) 70 - 99 mg/dL    Comment 1 Notify RN      Comment 2 Documented in Chart     GLUCOSE, CAPILLARY     Status: Abnormal   Collection Time   03/25/12  4:51 PM      Component Value Range Comment   Glucose-Capillary 186 (*) 70 - 99 mg/dL   GLUCOSE, CAPILLARY     Status: Abnormal   Collection Time   03/25/12  8:53 PM      Component Value Range Comment   Glucose-Capillary 101 (*) 70 - 99 mg/dL    Comment 1 Notify RN     PROTIME-INR     Status: Normal   Collection Time   03/26/12  6:25 AM      Component Value Range Comment   Prothrombin Time 13.8  11.6 - 15.2 seconds    INR 1.07  0.00 - 1.49   GLUCOSE, CAPILLARY     Status: Abnormal   Collection Time   03/26/12  7:37 AM      Component Value Range Comment   Glucose-Capillary 52 (*) 70 - 99 mg/dL    Comment 1 Notify RN     GLUCOSE, CAPILLARY     Status: Abnormal   Collection Time   03/26/12  8:24 AM  Component Value Range Comment   Glucose-Capillary 128 (*) 70 - 99 mg/dL    Comment 1 Documented in Chart      Comment 2 Notify RN        HEENT: normal Cardio: RRR Resp: CTA B/L GI: BS positive Extremity:  No Edema Skin:   Wound Coban wrap to be removed by Ortho in 1 week Neuro: Alert/Oriented, Abnormal Sensory reduce R toes and Normal Motor Musc/Skel:  Other L BKA, Coban wrap   Assessment/Plan: 1. Functional deficits secondary to L BKA due to diabetes and gangrene which require 3+ hours per day of interdisciplinary therapy in a comprehensive inpatient rehab setting. Physiatrist is providing close team supervision and 24 hour management of active medical problems listed below. Physiatrist and rehab team continue to assess barriers to discharge/monitor patient progress toward functional and medical goals. FIM: FIM - Bathing Bathing Steps Patient Completed: Chest;Right Arm;Left Arm;Abdomen;Front perineal area;Buttocks;Right lower leg (including foot);Left upper leg;Right upper leg;Left lower leg (including  foot) Bathing: 4: Steadying assist  FIM - Upper Body Dressing/Undressing Upper body dressing/undressing steps patient completed: Thread/unthread right sleeve of pullover shirt/dresss;Thread/unthread left sleeve of pullover shirt/dress;Put head through opening of pull over shirt/dress;Pull shirt over trunk Upper body dressing/undressing: 5: Set-up assist to: Obtain clothing/put away FIM - Lower Body Dressing/Undressing Lower body dressing/undressing steps patient completed: Thread/unthread right underwear leg;Thread/unthread left underwear leg;Pull underwear up/down;Thread/unthread right pants leg;Thread/unthread left pants leg;Pull pants up/down;Don/Doff right sock Lower body dressing/undressing: 5: Set-up assist to: Obtain clothing  FIM - Toileting Toileting steps completed by patient: Performs perineal hygiene Toileting: 2: Max-Patient completed 1 of 3 steps  FIM - Diplomatic Services operational officer Devices: Bedside commode (BSC over toilet) Toilet Transfers: 4-To toilet/BSC: Min A (steadying Pt. > 75%);4-From toilet/BSC: Min A (steadying Pt. > 75%)  FIM - Bed/Chair Transfer Bed/Chair Transfer: 5: Supine > Sit: Supervision (verbal cues/safety issues);5: Sit > Supine: Supervision (verbal cues/safety issues);3: Bed > Chair or W/C: Mod A (lift or lower assist);3: Chair or W/C > Bed: Mod A (lift or lower assist) (without device)  FIM - Locomotion: Wheelchair Distance: 150' Locomotion: Wheelchair: 5: Travels 150 ft or more: maneuvers on rugs and over door sills with supervision, cueing or coaxing FIM - Locomotion: Ambulation Locomotion: Ambulation Assistive Devices: Designer, industrial/product Ambulation/Gait Assistance: 3: Mod assist Locomotion: Ambulation: 1: Travels less than 50 ft with moderate assistance (Pt: 50 - 74%)  Comprehension Comprehension Mode: Auditory Comprehension: 7-Follows complex conversation/direction: With no assist  Expression Expression Mode: Verbal Expression:  7-Expresses complex ideas: With no assist  Social Interaction Social Interaction: 7-Interacts appropriately with others - No medications needed.  Problem Solving Problem Solving: 7-Solves complex problems: Recognizes & self-corrects  Memory Memory: 7-Complete Independence: No helper   Medical Problem List and Plan:  1. Left below-knee amputation secondary to peripheral vascular disease 03/22/2012  2. DVT Prophylaxis/Anticoagulation: Coumadin for DVT prophylaxis. Monitor for any bleeding episodes  3. Pain Management: Oxycodone as well as Robaxin as needed. Neurontin 400 mg 3 times a day. Will titrate medications  4. Neuropsych: This patient is capable of making decisions on his/her own behalf.  5. History of renal transplant. Continue immunosuppressant medication  6. 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 Hemoglobin A1c of 11.2. Lantus insulin 32 units twice a day. Check blood sugars a.c. and at bedtime  7. Hypertension. Lopressor 25 mg twice a day. Monitor with increased mobility  8. Hyperlipidemia. Lovaza, Lipitor   LOS (Days) 2 A FACE TO FACE EVALUATION WAS PERFORMED  Claudette Laws E 03/26/2012, 10:59 AM

## 2012-03-26 NOTE — Progress Notes (Signed)
ANTICOAGULATION CONSULT NOTE - Follow Up Consult  Pharmacy Consult for warfarin Indication: VTE prophylaxis  Allergies  Allergen Reactions  . Penicillins Other (See Comments)    Since birth    Patient Measurements: Height: 5\' 11"  (180.3 cm) Weight: 242 lb 8 oz (109.997 kg) IBW/kg (Calculated) : 75.3    Vital Signs: Temp: 99.1 F (37.3 C) (09/22 0500) Temp src: Oral (09/22 0500) BP: 111/56 mmHg (09/22 0842) Pulse Rate: 90  (09/22 0842)  Labs:  [name] 03/26/12 0625 03/25/12 0650 03/24/12 0550  HGB -- -- 11.1*  HCT -- -- 34.6*  PLT -- -- 186  APTT -- -- --  LABPROT 13.8 15.6* 15.1  INR 1.07 1.27 1.21  HEPARINUNFRC -- -- --  CREATININE -- -- 1.24  CKTOTAL -- -- --  CKMB -- -- --  TROPONINI -- -- --    Estimated Creatinine Clearance: 87.9 ml/min (by C-G formula based on Cr of 1.24).   Medications:  Prescriptions prior to admission  Medication Sig Dispense Refill  . esomeprazole (NEXIUM) 40 MG capsule Take 40 mg by mouth daily before breakfast.      . gabapentin (NEURONTIN) 300 MG capsule Take 1 capsule (300 mg total) by mouth 3 (three) times daily.  90 capsule  0  . insulin aspart (NOVOLOG) 100 UNIT/ML injection Inject 12 Units into the skin 3 (three) times daily before meals.  1 vial  0  . insulin glargine (LANTUS) 100 UNIT/ML injection Inject 32 Units into the skin 2 (two) times daily.      . metoprolol tartrate (LOPRESSOR) 25 MG tablet Take 25 mg by mouth 2 (two) times daily.      . mycophenolate (MYFORTIC) 360 MG TBEC Take 360 mg by mouth 2 (two) times daily.       Marland Kitchen omega-3 acid ethyl esters (LOVAZA) 1 G capsule Take 2 g by mouth 2 (two) times daily.      . predniSONE (DELTASONE) 5 MG tablet Take 5 mg by mouth daily.      . rosuvastatin (CRESTOR) 5 MG tablet Take 5 mg by mouth daily.      . tacrolimus (PROGRAF) 1 MG capsule Take 4 mg by mouth 2 (two) times daily.        Assessment: 51 year old man s/p left BKA requiring warfarin for VTE prophylaxis.  INR  slow to rise - 1.07 today Goal of Therapy:  INR 2-3 Monitor platelets by anticoagulation protocol: Yes   Plan:  Warfarin 7.5mg  x 1 dose today. Continue daily protimes.  Mickeal Skinner 03/26/2012,10:45 AM

## 2012-03-27 ENCOUNTER — Inpatient Hospital Stay (HOSPITAL_COMMUNITY): Payer: Medicare Other

## 2012-03-27 ENCOUNTER — Inpatient Hospital Stay (HOSPITAL_COMMUNITY): Payer: Medicare Other | Admitting: *Deleted

## 2012-03-27 ENCOUNTER — Encounter (HOSPITAL_COMMUNITY): Payer: Medicare Other | Admitting: Occupational Therapy

## 2012-03-27 ENCOUNTER — Inpatient Hospital Stay (HOSPITAL_COMMUNITY): Payer: Medicare Other | Admitting: Occupational Therapy

## 2012-03-27 DIAGNOSIS — S88119A Complete traumatic amputation at level between knee and ankle, unspecified lower leg, initial encounter: Secondary | ICD-10-CM

## 2012-03-27 DIAGNOSIS — Z5189 Encounter for other specified aftercare: Secondary | ICD-10-CM

## 2012-03-27 DIAGNOSIS — I739 Peripheral vascular disease, unspecified: Secondary | ICD-10-CM

## 2012-03-27 DIAGNOSIS — L98499 Non-pressure chronic ulcer of skin of other sites with unspecified severity: Secondary | ICD-10-CM

## 2012-03-27 LAB — CBC WITH DIFFERENTIAL/PLATELET
Basophils Relative: 0 % (ref 0–1)
Eosinophils Relative: 2 % (ref 0–5)
Hemoglobin: 11.6 g/dL — ABNORMAL LOW (ref 13.0–17.0)
Lymphocytes Relative: 13 % (ref 12–46)
Monocytes Absolute: 1.8 10*3/uL — ABNORMAL HIGH (ref 0.1–1.0)
Monocytes Relative: 8 % (ref 3–12)
Neutrophils Relative %: 77 % (ref 43–77)
Platelets: 221 10*3/uL (ref 150–400)
RBC: 4.33 MIL/uL (ref 4.22–5.81)
WBC: 22.5 10*3/uL — ABNORMAL HIGH (ref 4.0–10.5)

## 2012-03-27 LAB — GLUCOSE, CAPILLARY
Glucose-Capillary: 42 mg/dL — CL (ref 70–99)
Glucose-Capillary: 63 mg/dL — ABNORMAL LOW (ref 70–99)
Glucose-Capillary: 90 mg/dL (ref 70–99)

## 2012-03-27 LAB — COMPREHENSIVE METABOLIC PANEL
ALT: 26 U/L (ref 0–53)
AST: 37 U/L (ref 0–37)
Alkaline Phosphatase: 260 U/L — ABNORMAL HIGH (ref 39–117)
CO2: 28 mEq/L (ref 19–32)
Calcium: 9.6 mg/dL (ref 8.4–10.5)
Chloride: 100 mEq/L (ref 96–112)
GFR calc non Af Amer: >90 mL/min (ref >90)
Glucose, Bld: 47 mg/dL — ABNORMAL LOW (ref 70–99)
Potassium: 4.4 mEq/L (ref 3.5–5.1)
Sodium: 135 mEq/L (ref 135–145)

## 2012-03-27 LAB — PROTIME-INR: Prothrombin Time: 15.4 seconds — ABNORMAL HIGH (ref 11.6–15.2)

## 2012-03-27 MED ORDER — INSULIN GLARGINE 100 UNIT/ML ~~LOC~~ SOLN: 16.0000 [IU] | Freq: Two times a day (BID) | SUBCUTANEOUS | Status: DC

## 2012-03-27 MED ORDER — WARFARIN SODIUM 7.5 MG PO TABS
7.5000 mg | ORAL_TABLET | Freq: Once | ORAL | Status: AC
  Administered 2012-03-27: 7.5 mg via ORAL
  Filled 2012-03-27: qty 1

## 2012-03-27 NOTE — Significant Event (Signed)
Hypoglycemic Event  CBG: 64  Treatment: 15 GM carbohydrate snack  Symptoms: None  Follow-up CBG: Time:1300 CBG Result:82  Possible Reasons for Event: Inadequate meal intake  Comments/MD notified: poor intake    Trevor Thompson  Remember to initiate Hypoglycemia Order Set & complete

## 2012-03-27 NOTE — Progress Notes (Signed)
Physical Therapy Session Note  Patient Details  Name: Trevor Thompson MRN: [id number] Date of Birth: 01/22/1961  Today's Date: 03/27/2012 Time: 0930-1015 Time Calculation (min): 45 min  Short Term Goals: Week 1:  PT Short Term Goal 1 (Week 1): = long term goals  Skilled Therapeutic Interventions/Progress Updates:  Pt reports being worn out from OT this AM and increased pain in residual limb. Educated on phantom pain/sensation as well.   Discussed options for home entry and stair negotiation. Discussed possibility of using different entrance which would only require 1 STE vs 3. Reports wife is unable to provide physical assistance due to her health and he does not have someone reliable to help. Also suggested option to put in temporary ramp, but pt reports he is renting the house and that would not be an option.  Pivot transfer to w/c with min A, cueing for technique but pt very quick to do it his own way. W/c propulsion on unit with supervision for endurance and strengthening to/from gym. Transfer with correct squat pivot technique from w/c to mat and w/c to bed with min A. Pt able to manage legrests with cueing. Supine therex for quad sets and hip flexion with knee extension for functional strengthening on L (3 sets of 10 each) while therapist adjusted leg rest for residual limb support. Gait with RW with minA x 35', cueing for posture.  Therapy Documentation Precautions:  Precautions Precautions: Fall Restrictions Weight Bearing Restrictions: Yes LLE Weight Bearing: Non weight bearing   Pain: 8/10 pain in residual limb. Reports premedicated.  See FIM for current functional status  Therapy/Group: Individual Therapy  Karolee Stamps [facility] 03/27/2012, 10:02 AM

## 2012-03-27 NOTE — Progress Notes (Signed)
ANTICOAGULATION CONSULT NOTE - Follow Up Consult  Pharmacy Consult:  Coumadin Indication: VTE prophylaxis  Allergies  Allergen Reactions  . Penicillins Other (See Comments)    Since birth    Patient Measurements: Height: 5\' 11"  (180.3 cm) Weight: 242 lb 8 oz (109.997 kg) IBW/kg (Calculated) : 75.3    Vital Signs: Temp: 99 F (37.2 C) (09/23 0500) Temp src: Oral (09/23 0500) BP: 113/64 mmHg (09/23 0500) Pulse Rate: 100  (09/23 0500)  Labs:  [name] 03/27/12 1191 03/26/12 0625 03/25/12 0650  HGB 11.6* -- --  HCT 36.6* -- --  PLT 221 -- --  APTT -- -- --  LABPROT 15.4* 13.8 15.6*  INR 1.24 1.07 1.27  HEPARINUNFRC -- -- --  CREATININE 0.92 -- --  CKTOTAL -- -- --  CKMB -- -- --  TROPONINI -- -- --    Estimated Creatinine Clearance: 118.5 ml/min (by C-G formula based on Cr of 0.92).     Assessment: 51 y/o male patient admitted with gangrenous left foot s/p left BKA requiring Coumadin for VTE prophylaxis. INR starting to trend up post dose increase.  No bleeding reported.   Goal of Therapy:  INR 2-3 Monitor platelets by anticoagulation protocol: Yes    Plan:  - Repeat Coumadin 7.5mg  PO today - Daily PT / INR - Monitor CBGs and insulin adjustments, WBC trend     Alexius Hangartner D. Laney Potash, PharmD, BCPS Pager:  [phone] 03/27/2012, 10:45 AM

## 2012-03-27 NOTE — Significant Event (Signed)
Hypoglycemic Event  CBG: 44  Treatment: 15 GM carbohydrate snack  Symptoms: None  Follow-up CBG: Time:1225 CBG Result:64  Possible Reasons for Event: Inadequate meal intake  Comments/MD notified:no    Trevor Thompson  Remember to initiate Hypoglycemia Order Set & complete

## 2012-03-27 NOTE — Progress Notes (Signed)
Physical Therapy Session Note  Patient Details  Name: Trevor Thompson MRN: [id number] Date of Birth: 1961/05/05  Today's Date: 03/27/2012 Time: 1191-4782 Time Calculation (min): 30 min  Short Term Goals: Week 1:  PT Short Term Goal 1 (Week 1): = long term goals  Skilled Therapeutic Interventions/Progress Updates:    therapeutic activity to address standing balance and tolerance while performing LLE A/ROM for contracture prevention and preprosthetic training and UE there-ex for joint preservation with 5 # weight(shoulder IR, ER, presses and chest press) close S for balance with 1 UE support; seated rest breaks required, discussed energy requirements of amputees and need for conditioning  Pain- c/o burning in residual limb when leg hanging down, relieved with repositioning  Therapy Documentation Precautions:  Precautions Precautions: Fall Restrictions Weight Bearing Restrictions: Yes LLE Weight Bearing: Non weight bearing 03/27/2012, 3:39 PM

## 2012-03-27 NOTE — Progress Notes (Signed)
Social Work Assessment and Plan Social Work Assessment and Plan  Patient Details  Name: Trevor Thompson MRN: [id number] Date of Birth: 06-26-1961  Today's Date: 03/27/2012  Problem List:  Patient Active Problem List  Diagnosis  . Cellulitis of left foot  . Necrotic toe  .  ? PVD (peripheral vascular disease)  . History of renal transplant  . 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 Insulin dependent type 2 diabetes mellitus, uncontrolled  . HTN (hypertension)  . Dehydration as evidenced by hemoconcentration  . Left foot pain  . Type 1 diabetes mellitus, uncontrolled  . Hypertension  . S/p cadaver renal transplant  . Cold intolerance  . 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 GERD (gastroesophageal reflux disease)  . 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 Hypertriglyceridemia  . Diabetic retinopathy  . Gangrene Associated With Diabetes Mellitus  . Leukocytosis  . Autonomic orthostatic hypotension  . Acute kidney injury  . Fever  . S/P BKA (below knee amputation)   Past Medical History:  Past Medical History  Diagnosis Date  . Type 1 diabetes mellitus, uncontrolled   . Hypertension   . Renal disorder   . PVD (peripheral vascular disease)   . S/p cadaver renal transplant   . Cold intolerance   . 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 GERD (gastroesophageal reflux disease)   . 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 Hypertriglyceridemia   . Diabetic retinopathy    Past Surgical History:  Past Surgical History  Procedure Date  . Kidney transplant 2004    right  . Cataract extraction w/ intraocular lens  implant, bilateral ~ 1997  . Av fistula placement 1997-2004    "I've got 3; right upper arm; right forearm; left forearm "  . Av fistula repair 1997-2004    "multiple"  . Amputation 02/25/2012    Procedure: AMPUTATION DIGIT;  Surgeon: Larina Earthly, MD;  Location: [facility] OR;  Service: Vascular;  Laterality: Left;  Left 2nd Toe  Amputation, Incision and Drainage left lateral foot.  . Amputation 03/22/2012    Procedure: AMPUTATION BELOW KNEE;  Surgeon: Nadara Mustard, MD;  Location: [facility] OR;  Service: Orthopedics;  Laterality: Left;  Left Below Knee Amputation   Social History:  reports that he quit smoking about 30 years ago. His smoking use included Cigarettes. He has a 1.5 pack-year smoking history. He has never used smokeless tobacco. He reports that he drinks about .6 ounces of alcohol per week. His drug history not on file.  Family / Support Systems Marital Status: Married Patient Roles: Spouse;Parent Spouse/Significant Other: [phone] home  [phone] Children: Daughter here Anticipated Caregiver: Wife Ability/Limitations of Caregiver: Wife has severe arthritis and uses a cane, also on disability because of this Caregiver Availability: 24/7 Family Dynamics: Pt reports they have a daughter here and son in [place].  Thye tend to go back and forth alot, his MD's are there.  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  He wants to be independent but the pain gets in the way. Recent Psychosocial Issues: Other health issues  Pyschiatric History: No history- deferred depression screen due to pt too tired and in pain. Substance Abuse History: No issues  Patient / Family Perceptions, Expectations & Goals Pt/Family understanding of illness & functional limitations: Pt is able to  expalin his condition and amputation.  He wants to do for himself since wife has her own health issues. 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  Community Resources [employer]: None Premorbid Home Care/DME Agencies: None Transportation available at discharge: Wife Resource referrals recommended: Support group (specify) (Amputee Support group)  Discharge Planning Living Arrangements: Spouse/significant other Support Systems: Spouse/significant other;Children;Other relatives;Friends/neighbors Type of Residence: Private residence Insurance Resources: [employer] Financial Resources: SSD;Family Support Financial Screen Referred: Yes Living Expenses: Lives with family Money Management: Patient;Spouse Do you have any problems obtaining your medications?: Yes (Describe) (has no MD here so he goes back and forth to [place]) Home Management: Wife Patient/Family Preliminary Plans: Return home with wife assisting with supervision level.  Should do well here as long as he pushes himself. Social Work Anticipated Follow Up Needs: HH/OP;Support Group DC Planning Additional Notes/Comments: Will attempt to connect pt with a primary MD here before he is discharged  Clinical Impression Pleasant gentleman who is in pain with his amputation.  He is motivated to improve and wants to get a primary MD prior to discharge here who is local. Wife supportive but disabled herself with severe arthritis.  [name] 03/27/2012, 10:52 AM

## 2012-03-27 NOTE — Progress Notes (Signed)
Pt refused hs snack.

## 2012-03-27 NOTE — Significant Event (Signed)
Hypoglycemic Event  CBG: 63  Treatment: 15 GM carbohydrate snack  Symptoms: None  Follow-up CBG: Time:0815 CBG Result:90  Possible Reasons for Event: Inadequate meal intake  Comments/MD notified:yes. Insulin modified    Trevor Thompson  Remember to initiate Hypoglycemia Order Set & complete

## 2012-03-27 NOTE — Progress Notes (Signed)
INITIAL ADULT NUTRITION ASSESSMENT Date: 03/27/2012   Time: 9:38 AM  Reason for Assessment: MD Consult for poor PO intake  INTERVENTION:  Snacks TID between meals.   DOCUMENTATION CODES Per approved criteria  -Obesity Unspecified    ASSESSMENT: Male 51 y.o.  Dx: S/P BKA (below knee amputation)  Hx:  Past Medical History  Diagnosis Date  . Type 1 diabetes mellitus, uncontrolled   . Hypertension   . Renal disorder   . PVD (peripheral vascular disease)   . S/p cadaver renal transplant   . Cold intolerance   . 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 GERD (gastroesophageal reflux disease)   . 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 Hypertriglyceridemia   . Diabetic retinopathy    Past Surgical History  Procedure Date  . Kidney transplant 2004    right  . Cataract extraction w/ intraocular lens  implant, bilateral ~ 1997  . Av fistula placement 1997-2004    "I've got 3; right upper arm; right forearm; left forearm "  . Av fistula repair 1997-2004    "multiple"  . Amputation 02/25/2012    Procedure: AMPUTATION DIGIT;  Surgeon: Larina Earthly, MD;  Location: [facility] OR;  Service: Vascular;  Laterality: Left;  Left 2nd Toe Amputation, Incision and Drainage left lateral foot.  . Amputation 03/22/2012    Procedure: AMPUTATION BELOW KNEE;  Surgeon: Nadara Mustard, MD;  Location: [facility] OR;  Service: Orthopedics;  Laterality: Left;  Left Below Knee Amputation    Related Meds:  Scheduled Meds:   . atorvastatin  10 mg Oral q1800  . gabapentin  400 mg Oral TID  . insulin aspart  0-15 Units Subcutaneous TID WC  . insulin glargine  16 Units Subcutaneous BID  . metoprolol tartrate  25 mg Oral BID  . mycophenolate  360 mg Oral BID  . omega-3 acid ethyl esters  2 g Oral BID  . pantoprazole  40 mg Oral Q1200  . predniSONE  5 mg Oral Daily  . tacrolimus  4 mg Oral BID  . warfarin  7.5 mg Oral ONCE-1800  . Warfarin - Pharmacist Dosing Inpatient   Does not apply q1800  . DISCONTD:  insulin aspart  4 Units Subcutaneous TID WC  . DISCONTD: insulin glargine  32 Units Subcutaneous BID   Continuous Infusions:  PRN Meds:.acetaminophen, methocarbamol (ROBAXIN) IV, methocarbamol, ondansetron (ZOFRAN) IV, ondansetron, oxyCODONE, polyethylene glycol, sorbitol   Ht: 5\' 11"  (180.3 cm)  Wt: 242 lb 8 oz (109.997 kg) (9/20 -- post amputation)  Ideal Wt: 73.6 kg % Ideal Wt: 149%  Usual Wt:  Wt Readings from Last 10 Encounters:  03/24/12 242 lb 8 oz (109.997 kg)  03/20/12 250 lb (113.399 kg)  03/07/12 250 lb (113.399 kg)  02/29/12 253 lb 8.5 oz (115 kg)  02/29/12 253 lb 8.5 oz (115 kg)  02/29/12 253 lb 8.5 oz (115 kg)  11/23/11 255 lb (115.667 kg)    % Usual Wt: 250-255 lb pre-amputation  BMI=36  Food/Nutrition Related Hx: Good appetite with good intake PTA; weight stable  Labs:  CMP     Component Value Date/Time   NA 135 03/27/2012 0638   K 4.4 03/27/2012 0638   CL 100 03/27/2012 0638   CO2 28 03/27/2012 0638   GLUCOSE 47* 03/27/2012 0638   BUN 16 03/27/2012 0638   CREATININE 0.92 03/27/2012 0638   CALCIUM 9.6 03/27/2012 0638   PROT 7.3 03/27/2012 0638   ALBUMIN 1.9* 03/27/2012 0638   AST 37  03/27/2012 0638   ALT 26 03/27/2012 0638   ALKPHOS 260* 03/27/2012 0638   BILITOT 0.3 03/27/2012 0638   GFRNONAA >90 03/27/2012 0638   GFRAA >90 03/27/2012 0638    CBG (last 3)   Basename 03/27/12 0814 03/27/12 0733 03/27/12 0712  GLUCAP 90 63* 42*    Sodium  Date/Time Value Range Status  03/27/2012  6:38 AM 135  135 - 145 mEq/L Final  03/24/2012  5:50 AM 135  135 - 145 mEq/L Final  03/20/2012 11:24 AM 140  135 - 145 mEq/L Final    Potassium  Date/Time Value Range Status  03/27/2012  6:38 AM 4.4  3.5 - 5.1 mEq/L Final  03/24/2012  5:50 AM 4.4  3.5 - 5.1 mEq/L Final  03/20/2012 11:24 AM 4.2  3.5 - 5.1 mEq/L Final     Intake/Output Summary (Last 24 hours) at 03/27/12 0942 Last data filed at 03/27/12 0900  Gross per 24 hour  Intake   2780 ml  Output   2950 ml  Net    -170 ml    Diet Order: CHO-modified medium  PO's:  85-100% meal completion  IVF:  None  Estimated Nutritional Needs:   Kcal: 2000-2200 Protein: 115-130 grams Fluid: 2-2.2 liters  Patient reports poor appetite, however, has been eating 85-100% of meals.  Blood glucoses have been low.  Patient does not drink milk because it causes him to have diarrhea; other foods cause him diarrhea too.  Patient is not willing to try supplements at this time, but will eat peanut butter and crackers, and [place] sandwiches for snacks.    NUTRITION DIAGNOSIS: -Altered nutrition-related laboratory values (specify) (NI-2.2).  Status: Ongoing  RELATED TO: inadequate medication regimen  AS EVIDENCED BY: low CBG's  MONITORING/EVALUATION(Goals): Goal:  Intake to meet >90% of estimated nutrition needs. Monitor:  PO intake, labs, weight trend.  EDUCATION NEEDS: -Education needs addressed   Joaquin Courts, RD, LDN, CNSC Pager# [phone] After Hours Pager# [phone]  03/27/2012, 9:38 AM

## 2012-03-27 NOTE — Care Management Note (Signed)
[facility] Individual Statement of Services  Patient Name:  Trevor Thompson  Date:  03/27/2012  Welcome to the [facility].  Our goal is to provide you with an individualized program based on your diagnosis and situation, designed to meet your specific needs.  With this comprehensive rehabilitation program, you will be expected to participate in at least 3 hours of rehabilitation therapies Monday-Friday, with modified therapy programming on the weekends.  Your rehabilitation program will include the following services:  Physical Therapy (PT), Occupational Therapy (OT), 24 hour per day rehabilitation nursing, Therapeutic Recreaction (TR), Case Management (RN and Child psychotherapist), Rehabilitation Medicine, Nutrition Services and Pharmacy Services  Weekly team conferences will be held on Tuesday to discuss your progress.  Your RN Case Designer, television/film set will talk with you frequently to get your input and to update you on team discussions.  Team conferences with you and your family in attendance may also be held.  Expected length of stay: 7-10 days Overall anticipated outcome: Supervision/mod/i level  Depending on your progress and recovery, your program may change.  Your RN Case Estate agent will coordinate services and will keep you informed of any changes.  Your RN Sports coach and SW names and contact numbers are listed  below.  The following services may also be recommended but are not provided by the [facility]patient [facility]:   Driving Evaluations  Home Health Rehabiltiation Services  Outpatient Rehabilitatation [facility]  Vocational Rehabilitation   Arrangements will be made to provide these services after discharge if needed.  Arrangements include referral to agencies that provide these services.  Your insurance has been verified to be:  Medicare Your primary doctor is:  MD in [place], would like a local MD  Pertinent  information will be shared with your doctor and your insurance company.   Social Worker:  Dossie Der, [place] [phone]  Information discussed with and copy given to patient by: [name], 03/27/2012, 8:37 AM

## 2012-03-27 NOTE — Discharge Summary (Signed)
Physician Discharge Summary  Patient ID: Trevor Thompson MRN: [id number] DOB/AGE: 1961/04/11 51 y.o.  Admit date: 03/22/2012 Discharge date: 03/27/2012  Admission Diagnoses: Osteomyelitis left foot  Discharge Diagnoses: Same Active Problems:  * No active hospital problems. *    Discharged Condition: stable  Hospital Course: Patient's hospital course was essentially unremarkable he was admitted for osteomyelitis of the left foot failed conservative wound care. Patient underwent a left transtibial amputation. Postoperatively patient did not have sufficient support at home to provide for him at home and was felt that he would benefit from either inpatient versus skilled nursing facility for discharge. Patient was felt to be safe for inpatient rehabilitation and was discharged to inpatient rehabilitation in stable condition.  Consults: None  Significant Diagnostic Studies: labs: Routine labs  Treatments: surgery: Please see operative note  Discharge Exam: Blood pressure 100/55, pulse 90, temperature 98.4 F (36.9 C), temperature source Oral, resp. rate 18, SpO2 96.00%. Incision/Wound: dressing clean dry and intact at time of discharge  Disposition: 62-Rehab Facility  Discharge Orders    Future Appointments: Provider: Department: Dept Phone: Center:   03/27/2012 8:15 AM Coralyn Mark, OT [facility] [phone] None   03/27/2012 9:30 AM Philip Aspen, PT [facility] [phone] None   03/27/2012 11:00 AM Delon Sacramento, OT [facility] [phone] None   03/27/2012 2:15 PM Elvia Collum [facility] [phone] None       Medication List     As of 03/27/2012  6:05 AM    ASK your doctor about these medications         esomeprazole 40 MG capsule   Commonly known as: NEXIUM   Take 40 mg by mouth daily before breakfast.      gabapentin 300 MG capsule   Commonly known as: NEURONTIN   Take 1 capsule (300 mg total) by mouth 3 (three) times daily.      insulin  aspart 100 UNIT/ML injection   Commonly known as: novoLOG   Inject 12 Units into the skin 3 (three) times daily before meals.      insulin glargine 100 UNIT/ML injection   Commonly known as: LANTUS   Inject 32 Units into the skin 2 (two) times daily.      metoprolol tartrate 25 MG tablet   Commonly known as: LOPRESSOR   Take 25 mg by mouth 2 (two) times daily.      mycophenolate 360 MG Tbec   Commonly known as: MYFORTIC   Take 360 mg by mouth 2 (two) times daily.      omega-3 acid ethyl esters 1 G capsule   Commonly known as: LOVAZA   Take 2 g by mouth 2 (two) times daily.      predniSONE 5 MG tablet   Commonly known as: DELTASONE   Take 5 mg by mouth daily.      rosuvastatin 5 MG tablet   Commonly known as: CRESTOR   Take 5 mg by mouth daily.      tacrolimus 1 MG capsule   Commonly known as: PROGRAF   Take 4 mg by mouth 2 (two) times daily.         Signed: Dequandre Cordova V 03/27/2012, 6:05 AM

## 2012-03-27 NOTE — Progress Notes (Signed)
Occupational Therapy Session Note  Patient Details  Name: Trevor Thompson MRN: [id number] Date of Birth: [birth year]  Today's Date: 03/27/2012 Time: 1100-1157 Time Calculation (min): 57 min   Skilled Therapeutic Interventions/Progress Updates:    Worked on static and dynamic standing balance using the RW.  Had pt work with WII and bowling activity to release walker with one hand while manipulating controller maintaining his balance.  Pt needs min instructional cueing for hand placement with sit to stand and stand to sit.  Able to maintain standing balance when releasing the RUE with close supervision but needs constant min assist to let go of the walker with the LUE while holding on with the right.  Pt performed a minimum of 10 sit to stand transitions during the activity with min assist needed as he fatigued.  Able to tolerate standing for 2-3 min intervals before needing rest breaks.  Therapy Documentation Precautions:  Precautions Precautions: Fall Restrictions Weight Bearing Restrictions: Yes LLE Weight Bearing: Non weight bearing  Pain: Pain Assessment Pain Assessment: 0-10 Pain Score:   5 Pain Type: Surgical pain Pain Location: Leg Pain Orientation: Left Pain Descriptors: Aching Pain Intervention(s): Medication (See eMAR);Repositioned Multiple Pain Sites: No ADL: See FIM for current functional status  Therapy/Group: Individual Therapy  Dimas Scheck OTR/L 03/27/2012, 4:05 PM

## 2012-03-27 NOTE — Significant Event (Signed)
Hypoglycemic Event  CBG: 42  Treatment: 15 GM carbohydrate snack  Symptoms: None  Follow-up CBG: Time:0735 CBG Result:63  Possible Reasons for Event: Inadequate meal intake and Medication regimen: insulin dose. MD modifiying order  Comments/MD notified:yes. Insulin modified    Madie Reno  Remember to initiate Hypoglycemia Order Set & complete

## 2012-03-27 NOTE — Progress Notes (Signed)
Patient ID: Maxden Hailu, male   DOB: 12/05/1960, 51 y.o.   MRN: [id number] Zyion Smialek is a 51 y.o. right-handed male with history of diabetes mellitus with peripheral neuropathy, renal transplant 2004, recent left second toe amputation secondary to peripheral vascular disease 02/25/2012. Patient was independent prior to admission. Admitted 03/22/2012 with nonhealing left foot and no change with conservative care. Limb was not felt to be salvageable. 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 Lajoyce Corners. Placed on Coumadin for DVT prophylaxis  Subjective/Complaints:   pain c/o during therapy but not at rest. Appetite not good--sugars quite low. . Pain under fair control. Wonders when post-op wrap will come off.  Review of Systems  Musculoskeletal: Positive for myalgias.  Skin: Positive for itching.  Neurological: Positive for dizziness. Negative for sensory change.  All other systems reviewed and are negative.    Objective: Vital Signs: Blood pressure 113/64, pulse 100, temperature 99 F (37.2 C), temperature source Oral, resp. rate 20, height 5\' 11"  (1.803 m), weight 109.997 kg (242 lb 8 oz), SpO2 93.00%. No results found. Results for orders placed during the hospital encounter of 03/24/12 (from the past 72 hour(s))  GLUCOSE, CAPILLARY     Status: Abnormal   Collection Time   03/24/12  4:29 PM      Component Value Range Comment   Glucose-Capillary 141 (*) 70 - 99 mg/dL   GLUCOSE, CAPILLARY     Status: Normal   Collection Time   03/24/12  8:50 PM      Component Value Range Comment   Glucose-Capillary 87  70 - 99 mg/dL   PROTIME-INR     Status: Abnormal   Collection Time   03/25/12  6:50 AM      Component Value Range Comment   Prothrombin Time 15.6 (*) 11.6 - 15.2 seconds    INR 1.27  0.00 - 1.49   GLUCOSE, CAPILLARY     Status: Normal   Collection Time   03/25/12  7:38 AM      Component Value Range Comment   Glucose-Capillary 96  70 - 99 mg/dL   GLUCOSE, CAPILLARY      Status: Abnormal   Collection Time   03/25/12 11:21 AM      Component Value Range Comment   Glucose-Capillary 68 (*) 70 - 99 mg/dL    Comment 1 Notify RN      Comment 2 Documented in Chart     GLUCOSE, CAPILLARY     Status: Abnormal   Collection Time   03/25/12  4:51 PM      Component Value Range Comment   Glucose-Capillary 186 (*) 70 - 99 mg/dL   GLUCOSE, CAPILLARY     Status: Abnormal   Collection Time   03/25/12  8:53 PM      Component Value Range Comment   Glucose-Capillary 101 (*) 70 - 99 mg/dL    Comment 1 Notify RN     PROTIME-INR     Status: Normal   Collection Time   03/26/12  6:25 AM      Component Value Range Comment   Prothrombin Time 13.8  11.6 - 15.2 seconds    INR 1.07  0.00 - 1.49   GLUCOSE, CAPILLARY     Status: Abnormal   Collection Time   03/26/12  7:37 AM      Component Value Range Comment   Glucose-Capillary 52 (*) 70 - 99 mg/dL    Comment 1 Notify RN     GLUCOSE, CAPILLARY  Status: Abnormal   Collection Time   03/26/12  8:24 AM      Component Value Range Comment   Glucose-Capillary 128 (*) 70 - 99 mg/dL    Comment 1 Documented in Chart      Comment 2 Notify RN     GLUCOSE, CAPILLARY     Status: Abnormal   Collection Time   03/26/12 11:38 AM      Component Value Range Comment   Glucose-Capillary 52 (*) 70 - 99 mg/dL    Comment 1 Documented in Chart      Comment 2 Notify RN     GLUCOSE, CAPILLARY     Status: Abnormal   Collection Time   03/26/12 12:44 PM      Component Value Range Comment   Glucose-Capillary 118 (*) 70 - 99 mg/dL    Comment 1 Documented in Chart      Comment 2 Notify RN     GLUCOSE, CAPILLARY     Status: Abnormal   Collection Time   03/26/12  4:16 PM      Component Value Range Comment   Glucose-Capillary 67 (*) 70 - 99 mg/dL    Comment 1 Documented in Chart      Comment 2 Notify RN     GLUCOSE, CAPILLARY     Status: Normal   Collection Time   03/26/12  5:19 PM      Component Value Range Comment   Glucose-Capillary 78  70 - 99  mg/dL    Comment 1 Documented in Chart      Comment 2 Notify RN     GLUCOSE, CAPILLARY     Status: Normal   Collection Time   03/26/12  8:55 PM      Component Value Range Comment   Glucose-Capillary 78  70 - 99 mg/dL    Comment 1 Notify RN     PROTIME-INR     Status: Abnormal   Collection Time   03/27/12  6:38 AM      Component Value Range Comment   Prothrombin Time 15.4 (*) 11.6 - 15.2 seconds    INR 1.24  0.00 - 1.49   CBC WITH DIFFERENTIAL     Status: Abnormal   Collection Time   03/27/12  6:38 AM      Component Value Range Comment   WBC 22.5 (*) 4.0 - 10.5 K/uL    RBC 4.33  4.22 - 5.81 MIL/uL    Hemoglobin 11.6 (*) 13.0 - 17.0 g/dL    HCT 54.0 (*) 98.1 - 52.0 %    MCV 84.5  78.0 - 100.0 fL    MCH 26.8  26.0 - 34.0 pg    MCHC 31.7  30.0 - 36.0 g/dL    RDW 19.1  47.8 - 29.5 %    Platelets 221  150 - 400 K/uL    Neutrophils Relative 77  43 - 77 %    Lymphocytes Relative 13  12 - 46 %    Monocytes Relative 8  3 - 12 %    Eosinophils Relative 2  0 - 5 %    Basophils Relative 0  0 - 1 %    Neutro Abs 17.3 (*) 1.7 - 7.7 K/uL    Lymphs Abs 2.9  0.7 - 4.0 K/uL    Monocytes Absolute 1.8 (*) 0.1 - 1.0 K/uL    Eosinophils Absolute 0.5  0.0 - 0.7 K/uL    Basophils Absolute 0.0  0.0 - 0.1 K/uL  RBC Morphology POLYCHROMASIA PRESENT     COMPREHENSIVE METABOLIC PANEL     Status: Abnormal   Collection Time   03/27/12  6:38 AM      Component Value Range Comment   Sodium 135  135 - 145 mEq/L    Potassium 4.4  3.5 - 5.1 mEq/L    Chloride 100  96 - 112 mEq/L    CO2 28  19 - 32 mEq/L    Glucose, Bld 47 (*) 70 - 99 mg/dL    BUN 16  6 - 23 mg/dL    Creatinine, Ser 1.61  0.50 - 1.35 mg/dL    Calcium 9.6  8.4 - 09.6 mg/dL    Total Protein 7.3  6.0 - 8.3 g/dL    Albumin 1.9 (*) 3.5 - 5.2 g/dL    AST 37  0 - 37 U/L    ALT 26  0 - 53 U/L    Alkaline Phosphatase 260 (*) 39 - 117 U/L    Total Bilirubin 0.3  0.3 - 1.2 mg/dL    GFR calc non Af Amer >90  >90 mL/min    GFR calc Af Amer >90   >90 mL/min   GLUCOSE, CAPILLARY     Status: Abnormal   Collection Time   03/27/12  7:12 AM      Component Value Range Comment   Glucose-Capillary 42 (*) 70 - 99 mg/dL    Comment 1 Notify RN     GLUCOSE, CAPILLARY     Status: Abnormal   Collection Time   03/27/12  7:33 AM      Component Value Range Comment   Glucose-Capillary 63 (*) 70 - 99 mg/dL    Comment 1 Notify RN     GLUCOSE, CAPILLARY     Status: Normal   Collection Time   03/27/12  8:14 AM      Component Value Range Comment   Glucose-Capillary 90  70 - 99 mg/dL    Comment 1 Notify RN        HEENT: normal Cardio: RRR Resp: CTA B/L GI: BS positive Extremity:  No Edema Skin:   Wound Coban wrap to be removed by Ortho in 1 week Neuro: Alert/Oriented, Abnormal Sensory reduce R toes and Normal Motor Musc/Skel:  Other L BKA, Coban wrap- continue   Assessment/Plan: 1. Functional deficits secondary to L BKA due to diabetes and gangrene which require 3+ hours per day of interdisciplinary therapy in a comprehensive inpatient rehab setting. Physiatrist is providing close team supervision and 24 hour management of active medical problems listed below. Physiatrist and rehab team continue to assess barriers to discharge/monitor patient progress toward functional and medical goals. FIM: FIM - Bathing Bathing Steps Patient Completed: Chest;Right Arm;Left Arm;Abdomen;Front perineal area;Buttocks;Right lower leg (including foot);Left upper leg;Right upper leg;Left lower leg (including foot) Bathing: 4: Steadying assist  FIM - Upper Body Dressing/Undressing Upper body dressing/undressing steps patient completed: Thread/unthread right sleeve of pullover shirt/dresss;Thread/unthread left sleeve of pullover shirt/dress;Put head through opening of pull over shirt/dress;Pull shirt over trunk Upper body dressing/undressing: 5: Set-up assist to: Obtain clothing/put away FIM - Lower Body Dressing/Undressing Lower body dressing/undressing steps  patient completed: Thread/unthread right underwear leg;Thread/unthread left underwear leg;Pull underwear up/down;Thread/unthread right pants leg;Thread/unthread left pants leg;Pull pants up/down;Don/Doff right sock Lower body dressing/undressing: 5: Set-up assist to: Obtain clothing  FIM - Toileting Toileting steps completed by patient: Performs perineal hygiene Toileting: 2: Max-Patient completed 1 of 3 steps  FIM - Diplomatic Services operational officer Devices:  Bedside commode (BSC over toilet) Toilet Transfers: 4-To toilet/BSC: Min A (steadying Pt. > 75%);4-From toilet/BSC: Min A (steadying Pt. > 75%)  FIM - Bed/Chair Transfer Bed/Chair Transfer: 5: Supine > Sit: Supervision (verbal cues/safety issues);5: Sit > Supine: Supervision (verbal cues/safety issues);3: Bed > Chair or W/C: Mod A (lift or lower assist);3: Chair or W/C > Bed: Mod A (lift or lower assist) (without device)  FIM - Locomotion: Wheelchair Distance: 150' Locomotion: Wheelchair: 5: Travels 150 ft or more: maneuvers on rugs and over door sills with supervision, cueing or coaxing FIM - Locomotion: Ambulation Locomotion: Ambulation Assistive Devices: Designer, industrial/product Ambulation/Gait Assistance: 3: Mod assist Locomotion: Ambulation: 1: Travels less than 50 ft with moderate assistance (Pt: 50 - 74%)  Comprehension Comprehension Mode: Auditory Comprehension: 7-Follows complex conversation/direction: With no assist  Expression Expression Mode: Verbal Expression: 7-Expresses complex ideas: With no assist  Social Interaction Social Interaction: 7-Interacts appropriately with others - No medications needed.  Problem Solving Problem Solving: 7-Solves complex problems: Recognizes & self-corrects  Memory Memory: 7-Complete Independence: No helper   Medical Problem List and Plan:  1. Left below-knee amputation secondary to peripheral vascular disease 03/22/2012  2. DVT Prophylaxis/Anticoagulation: Coumadin for DVT  prophylaxis. Monitor for any bleeding episodes  3. Pain Management: Oxycodone as well as Robaxin as needed. Neurontin 400 mg 3 times a day. Will titrate medications  4. Neuropsych: This patient is capable of making decisions on his/her own behalf.  5. History of renal transplant. Continue immunosuppressant medication  6. 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 Hemoglobin A1c of 11.2. Sugars quite low. Decrease lantus and stop mealtime covg.  -push po  -rd consult for poor appetite.  7. Hypertension. Lopressor 25 mg twice a day. Monitor with increased mobility  8. Hyperlipidemia. Lovaza, Lipitor   LOS (Days) 3 A FACE TO FACE EVALUATION WAS PERFORMED  SWARTZ,ZACHARY T 03/27/2012, 9:11 AM

## 2012-03-27 NOTE — Progress Notes (Signed)
Occupational Therapy Session Note  Patient Details  Name: Trevor Thompson MRN: [id number] Date of Birth: Nov 13, 1960  Today's Date: 03/27/2012 Time: 0815-0900 Time Calculation (min): 45 min  Short Term Goals = Long Term Goals  Skilled Therapeutic Interventions/Progress Updates:  Upon entering room patient found seated in chair beside sink with pants half up. Patient stood with rolling walker to pull pants up to waist with steady assist. Patient then transferred into w/c and propelled self -> tub room for simulated tub/shower transfer on/off tub/stransfer bench. Patient min assist for transfer <->. When patient ambulated back to w/c that was seated outside of room patient with LOB and left residual limb touched the floor after coming close to falling in floor. Patient caught self on w/c and pulled self into w/c seat; minimal complaints of pain-RN notified. Patient then engaged in UE exercises by doing w/c pushups and using theraband. At end of session left patient seated in w/c with call bell & phone within reach.   Precautions:  Precautions Precautions: Fall Restrictions Weight Bearing Restrictions: Yes LLE Weight Bearing: Non weight bearing  See FIM for current functional status  Therapy/Group: Individual Therapy  Trevor Thompson 03/27/2012, 9:39 AM

## 2012-03-28 ENCOUNTER — Inpatient Hospital Stay (HOSPITAL_COMMUNITY): Payer: Medicare Other | Admitting: Physical Therapy

## 2012-03-28 ENCOUNTER — Inpatient Hospital Stay (HOSPITAL_COMMUNITY): Payer: Medicare Other | Admitting: Occupational Therapy

## 2012-03-28 ENCOUNTER — Inpatient Hospital Stay (HOSPITAL_COMMUNITY): Payer: Medicare Other

## 2012-03-28 LAB — PROTIME-INR
INR: 1.69 — ABNORMAL HIGH (ref 0.00–1.49)
Prothrombin Time: 19.3 seconds — ABNORMAL HIGH (ref 11.6–15.2)

## 2012-03-28 LAB — GLUCOSE, CAPILLARY: Glucose-Capillary: 132 mg/dL — ABNORMAL HIGH (ref 70–99)

## 2012-03-28 MED ORDER — WARFARIN SODIUM 6 MG PO TABS
6.0000 mg | ORAL_TABLET | Freq: Once | ORAL | Status: AC
  Administered 2012-03-28: 6 mg via ORAL
  Filled 2012-03-28: qty 1

## 2012-03-28 MED ORDER — ALUM & MAG HYDROXIDE-SIMETH 200-200-20 MG/5ML PO SUSP: 30.0000 mL | Freq: Four times a day (QID) | ORAL | Status: DC | PRN

## 2012-03-28 NOTE — Progress Notes (Signed)
ANTICOAGULATION CONSULT NOTE - Follow Up Consult  Pharmacy Consult:  Coumadin Indication: VTE prophylaxis  Allergies  Allergen Reactions  . Penicillins Other (See Comments)    Since birth    Patient Measurements: Height: 5\' 11"  (180.3 cm) Weight: 242 lb 8 oz (109.997 kg) IBW/kg (Calculated) : 75.3    Vital Signs: Temp: 99.5 F (37.5 C) (09/24 0500) Temp src: Oral (09/24 0500) BP: 132/72 mmHg (09/24 0500) Pulse Rate: 91  (09/24 0500)  Labs:  [name] 03/28/12 0605 03/27/12 6213 03/26/12 0625  HGB -- 11.6* --  HCT -- 36.6* --  PLT -- 221 --  APTT -- -- --  LABPROT 19.3* 15.4* 13.8  INR 1.69* 1.24 1.07  HEPARINUNFRC -- -- --  CREATININE -- 0.92 --  CKTOTAL -- -- --  CKMB -- -- --  TROPONINI -- -- --    Estimated Creatinine Clearance: 118.5 ml/min (by C-G formula based on Cr of 0.92).     Assessment: 51 y/o male patient admitted with gangrenous left foot s/p left BKA requiring Coumadin for VTE prophylaxis. INR trending up towards goal.  No bleeding documented.   Goal of Therapy:  INR 2-3 Monitor platelets by anticoagulation protocol: Yes    Plan:  - Coumadin 6mg  PO today - Daily PT / INR - Monitor CBGs, WBC trend    Teven Mittman D. Laney Potash, PharmD, BCPS Pager:  [phone] 03/28/2012, 8:45 AM

## 2012-03-28 NOTE — Progress Notes (Signed)
Physical Therapy Session Note  Patient Details  Name: Trevor Thompson MRN: [id number] Date of Birth: [birth year]  Today's Date: 03/28/2012 Time: 4098-1191 Time Calculation (min): 55 min  Short Term Goals: Week 1:  PT Short Term Goal 1 (Week 1): = long term goals  Floor transfers from w/c <-> mat and up/down 2 steps on bottom for strengthening and negotiating stairs at home. Pt requires heavy min A for floor to w/c and S to floor.   Rest of treatment focused on outdoor w/c mobility to practice community re-integration, uneven surface negotiation, up/down ramp, and negotiating elevators with overall S and rest breaks as needed. Pt surprised at how much energy he has to exert. Discussed energy conservation techniques with day to day activities and outings for errands, etc.      Therapy Documentation Precautions:  Precautions Precautions: Fall Restrictions Weight Bearing Restrictions: Yes LLE Weight Bearing: Non weight bearing     Pain: C/o pain in residual limb - premedicated.   See FIM for current functional status  Therapy/Group: Individual Therapy  Karolee Stamps [facility] 03/28/2012, 2:12 PM

## 2012-03-28 NOTE — Progress Notes (Addendum)
Physical Therapy Session Note  Patient Details  Name: Trevor Thompson MRN: [id number] Date of Birth: [birth year]  Today's Date: 03/28/2012 Time: 4132-4401 Time Calculation (min): 32 min  Short Term Goals: Week 1:  PT Short Term Goal 1 (Week 1): = long term goals  Skilled Therapeutic Interventions/Progress Updates:    This session focused on WC mobility and initiating stair training.  He has 3 steps to enter with no rails and a flight of stairs that has both rails 1/2 way up and then only one rail the rest of the way.  His strategy is going to be to scoot up the stairs on his bottom, so we practiced floor transfers in anticipation that this would be the strategy he used at home.  I would advise to also continue practicing stairs the conventional way, but I wanted pt to be prepared for either method.  See details below.    Therapy Documentation Precautions:  Precautions Precautions: Fall Restrictions Weight Bearing Restrictions: Yes LLE Weight Bearing: Non weight bearing General: Amount of Missed PT Time (min): 15 Minutes Missed Time Reason: Other (comment) (eating)   Pain: Pain Assessment Pain Assessment: 0-10 Pain Score:   6 Pain Type: Surgical pain Pain Location: Leg Pain Orientation: Left Pain Intervention(s): Repositioned;Ambulation/increased activity Mobility: Bed Mobility Supine to Sit: With rails;HOB flat;6: Modified independent (Device/Increase time) Transfers Sit to Stand: 5: Supervision;With upper extremity assist;From bed;From chair/3-in-1 Sit to Stand Details: Verbal cues for precautions/safety Sit to Stand Details (indicate cue type and reason): verbal cues for safe hand placement for stand Stand to Sit: 5: Supervision Stand to Sit Details (indicate cue type and reason): Verbal cues for precautions/safety Squat Pivot Transfers: 6: Modified independent (Device/Increase time);With upper extremity assistance Locomotion : Ambulation Ambulation: Yes Ambulation/Gait  Assistance: 5: Supervision Ambulation Distance (Feet): 200 Feet (50' x 4) Assistive device: Rolling walker Ambulation/Gait Assistance Details: chair to follow to encourage increased gait distance.   Stairs / Additional Locomotion Stairs: Yes Stairs Assistance: 4: Min assist Stairs Assistance Details: Verbal cues for sequencing;Verbal cues for technique;Verbal cues for precautions/safety;Manual facilitation for weight shifting Stairs Assistance Details (indicate cue type and reason): min assist to stabilize pt at hips for balance and to help stabilize RW when used.   Stair Management Technique: With walker;Forwards;Two rails;Step to pattern;With crutches Number of Stairs: 10  Height of Stairs: 6  (1" all the way up to 6") Curb: 4: Writer: Yes Wheelchair Assistance: 5: Financial planner Details: Verbal cues for safe use of DME/AE Wheelchair Propulsion: Both upper extremities;Right lower extremity Wheelchair Parts Management: Needs assistance (with leg rest on the left) Distance: 200     Balance: Static Sitting Balance Static Sitting - Balance Support: No upper extremity supported;Feet supported Static Sitting - Level of Assistance: 5: Stand by assistance Exercises: General Exercises - Lower Extremity Long Arc Quad: AROM;Left;15 reps;Seated Standing Knee Flexion: AROM;Left;15 reps Total Joint Exercises Bridges: AROM;Right;Supine;15 reps Amputee Exercises Quad Sets: AROM;Left;15 reps;Supine Hip ABduction/ADduction: AROM;Left;15 reps;Sidelying;Standing Straight Leg Raises: AROM;Left;15 reps;Supine Other Exercises Other Exercises: standing hip extension AROM left x10 reps, standing sidelying and prone Other Exercises: prone knee flexion left x 15 reps Other Treatments: Treatments Therapeutic Activity: Talked at length about strategy for stairs at home.  Attempted stairs with one handrail and one crutch, but pt was unable to  successfully use crutch today (LOB and dropped crutch).  He reports that he will never be able to go up the stairs backwards.  He is able to hop  up with two handrails, but he has no handrails outside at home and only one half way up the flight inside at home.  He told me, "Honestly I will scoot up on my bottom and then crawl to my room"  So, this being said we practiced floor transfers with mod assist to get from floor back into WC.    See FIM for current functional status  Therapy/Group: Individual Therapy  Lurena Joiner B. Katrisha Segall, PT, DPT [phone]   03/28/2012, 12:18 PM

## 2012-03-28 NOTE — Patient Care Conference (Signed)
Inpatient RehabilitationTeam Conference Note Date: 03/28/2012   Time: 2:10 PM    Patient Name: Trevor Thompson      Medical Record Number: [id number]  Date of Birth: [birth year] Sex: Male         Room/Bed: 4149/4149-01 Payor Info: Payor: MEDICARE  Plan: MEDICARE PART A AND B  Product Type: *No Product type*     Admitting Diagnosis: LT BKA  Admit Date/Time:  03/24/2012  3:14 PM Admission Comments: No comment available   Primary Diagnosis:  S/P BKA (below knee amputation) Principal Problem: S/P BKA (below knee amputation)  Patient Active Problem List   Diagnosis Date Noted  . S/P BKA (below knee amputation) 03/24/2012  . Fever 02/28/2012  . 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 Acute kidney injury 02/27/2012  . Leukocytosis 02/26/2012  . 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 Cellulitis of left foot 02/21/2012  . Necrotic toe 02/21/2012  .  ? PVD (peripheral vascular disease) 02/21/2012  . History of renal transplant 02/21/2012  . 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 Insulin dependent type 2 diabetes mellitus, uncontrolled 02/21/2012  . HTN (hypertension) 02/21/2012  . Dehydration as evidenced by hemoconcentration 02/21/2012  . Left foot pain 02/21/2012  . Type 1 diabetes mellitus, uncontrolled   . Hypertension   . S/p cadaver renal transplant   . Cold intolerance   . 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 GERD (gastroesophageal reflux disease)   . 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 Hypertriglyceridemia   . Diabetic retinopathy     Expected Discharge Date: Expected Discharge Date: 04/01/12  Team Members Present: Physician: Dr. Faith Rogue Social Worker Present: Dossie Der, LCSW Nurse Present: Other (comment) Donnamarie Poag Hicks-RN) PT Present: Karolee Stamps, PT OT Present: Mackie Pai, OT;Patricia Mat Carne, OT SLP Present: Feliberto Gottron, SLP Other (Discipline and Name): Charolette Child  Coordinator     Current Status/Progress Goal Weekly Team Focus  Medical   left bka, pain mgt  pre-pros ed, pain mgt.  wound care and stump mgt   Bowel/Bladder   Pt. continent of bowel and bladder         Swallow/Nutrition/ Hydration             ADL's   supervision -> steady assist  supervision ->  mod I  ADL retraining, UE strengthening, overall activity tolerance/endurance   Mobility   min/mod A overall transfers and gait, S w/c mobility  mod I w/c level, S gait, stairs with min A??  stair negotiation, standing balance, safety, gait,   Communication             Safety/Cognition/ Behavioral Observations            Pain   Neurotin 400mg  TID, Oxycodone 10mg  PRN q4 hrs  3 or less  monitpor pain level and medication as needed and before therapy   Skin   Left BKA with coban original dressing intact  no new skin breakdown   assess skin q shift and monitor for skin breakdown and s/s of infection      *See Interdisciplinary Assessment and Plan and progress notes for long and short-term goals  Barriers to Discharge: none identified    Possible Resolutions to Barriers:  n.a    Discharge Planning/Teaching Needs:  Home with wife who can porvide supervision level only-due to won health issues.      Team Discussion:  Pt can take a shower tomorrow, working on stairs. Family education Check wound Thurs  Revisions to Treatment Plan:  None   Continued Need for Acute Rehabilitation Level of Care: The patient requires daily medical management by a physician with specialized training in physical medicine and rehabilitation for the following conditions: Daily direction of a multidisciplinary physical rehabilitation program to ensure safe treatment while eliciting the highest outcome that is of practical value to the patient.: Yes Daily medical management of patient stability for increased activity during participation in an intensive rehabilitation regime.: Yes Daily analysis of  laboratory values and/or radiology reports with any subsequent need for medication adjustment of medical intervention for : Other;Post surgical problems;Neurological problems  [name] 03/28/2012, 2:33 PM

## 2012-03-28 NOTE — Progress Notes (Signed)
Social Work Patient ID: Trevor Thompson, male   DOB: [birth year], 51 y.o.   MRN: [id number] Met with pt and spoke with wife via telephone to inform team conference goals-supervision/mod/i level and discharge 9/28. Wife to come back in to go through therapies with pt, car and steps.  Agreeable to DME and follow up recommendations. Will work toward discharge date.  Both wife and pt pleased with plan.

## 2012-03-28 NOTE — Progress Notes (Signed)
Physical Therapy Session Note  Patient Details  Name: Trevor Thompson MRN: [id number] Date of Birth: October 03, 1960  Today's Date: 03/28/2012 Time: 4098-1191 Time Calculation (min): 30 min  Short Term Goals: Week 1:  PT Short Term Goal 1 (Week 1): = long term goals  Skilled Therapeutic Interventions/Progress Updates:    This morning's session focused on amputee exercises encouraging hip extension in both supine prone and standing, WC mobility and management of his left leg rest, and car transfers with cues for safety and strategies to make it easier.  See details below.  Next session we are going to attempt to start practicing stairs.    Therapy Documentation Precautions:  Precautions Precautions: Fall Restrictions Weight Bearing Restrictions: Yes LLE Weight Bearing: Non weight bearing General: Amount of Missed PT Time (min): 15 Minutes Missed Time Reason: Other (comment) (eating)   Pain: Pain Assessment Pain Assessment: 0-10 Pain Score:   4 Pain Type: Surgical pain Pain Location: Leg Pain Orientation: Left Pain Descriptors: Aching;Sore Pain Intervention(s): Repositioned;Ambulation/increased activity Mobility: Transfers Sit to Stand: 5: Supervision;With upper extremity assist;From chair/3-in-1;With armrests Sit to Stand Details: Verbal cues for precautions/safety Sit to Stand Details (indicate cue type and reason): verbal cues for safe hand placement for stand Stand to Sit: 5: Supervision Stand to Sit Details (indicate cue type and reason): Verbal cues for precautions/safety Locomotion : Ambulation Ambulation: Yes Ambulation/Gait Assistance: 5: Supervision Ambulation Distance (Feet): 200 Feet (50' x 4) Assistive device: Rolling walker Ambulation/Gait Assistance Details: chair to follow to encourage increased gait distance.   Corporate treasurer: Yes Wheelchair Assistance: 5: Financial planner Details: Verbal cues for safe use of  DME/AE Wheelchair Propulsion: Both upper extremities;Right lower extremity Wheelchair Parts Management: Needs assistance Distance: 200     Balance: Static Sitting Balance Static Sitting - Balance Support: No upper extremity supported;Feet supported Static Sitting - Level of Assistance: 5: Stand by assistance Exercises: General Exercises - Lower Extremity Long Arc Quad: AROM;Left;15 reps;Seated Standing Knee Flexion: AROM;Left;15 reps Total Joint Exercises Bridges: AROM;Right;Supine;15 reps Amputee Exercises Quad Sets: AROM;Left;15 reps;Supine Hip ABduction/ADduction: AROM;Left;15 reps;Sidelying;Standing Straight Leg Raises: AROM;Left;15 reps;Supine Other Exercises Other Exercises: standing hip extension AROM left x10 reps, standing sidelying and prone Other Exercises: prone knee flexion left x 15 reps Other Treatments: Treatments Therapeutic Activity: Worked on car transfer from [facility] with RW, supervision, verbal cues for technique, safety and strategies to make it easier.    See FIM for current functional status  Therapy/Group: Individual Therapy  Trevor Thompson, PT, DPT [phone]   03/28/2012, 9:35 AM

## 2012-03-28 NOTE — Progress Notes (Signed)
Occupational Therapy Session Note  Patient Details  Name: Trevor Thompson MRN: [id number] Date of Birth: 1961/05/30  Today's Date: 03/28/2012 Time: 4098-1191 Time Calculation (min): 45 min  Short Term Goals = Long Term Goals  Skilled Therapeutic Interventions/Progress Updates:  Treatment focus on UE strengthening using SCIFIT machine and theraband. Also focused on w/c propulsion/maneuvering.   Precautions:  Precautions Precautions: Fall Restrictions Weight Bearing Restrictions: Yes LLE Weight Bearing: Non weight bearing  See FIM for current functional status  Therapy/Group: Individual Therapy  Caprice Wasko 03/28/2012, 12:06 PM

## 2012-03-28 NOTE — Progress Notes (Signed)
Patient ID: Trevor Thompson, male   DOB: [birth year], 51 y.o.   MRN: [id number] Trevor Thompson is a 51 y.o. right-handed male with history of diabetes mellitus with peripheral neuropathy, renal transplant 2004, recent left second toe amputation secondary to peripheral vascular disease 02/25/2012. Patient was independent prior to admission. Admitted 03/22/2012 with nonhealing left foot and no change with conservative care. Limb was not felt to be salvageable. 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 Lajoyce Corners. Placed on Coumadin for DVT prophylaxis  Subjective/Complaints:   sugars remain low. Pain under control.  Review of Systems  Musculoskeletal: Positive for myalgias.  Neurological: Positive for dizziness. Negative for sensory change.  All other systems reviewed and are negative.    Objective: Vital Signs: Blood pressure 132/72, pulse 91, temperature 99.5 F (37.5 C), temperature source Oral, resp. rate 19, height 5\' 11"  (1.803 m), weight 109.997 kg (242 lb 8 oz), SpO2 91.00%. No results found. Results for orders placed during the hospital encounter of 03/24/12 (from the past 72 hour(s))  PROTIME-INR     Status: Abnormal   Collection Time   03/25/12  6:50 AM      Component Value Range Comment   Prothrombin Time 15.6 (*) 11.6 - 15.2 seconds    INR 1.27  0.00 - 1.49   GLUCOSE, CAPILLARY     Status: Normal   Collection Time   03/25/12  7:38 AM      Component Value Range Comment   Glucose-Capillary 96  70 - 99 mg/dL   GLUCOSE, CAPILLARY     Status: Abnormal   Collection Time   03/25/12 11:21 AM      Component Value Range Comment   Glucose-Capillary 68 (*) 70 - 99 mg/dL    Comment 1 Notify RN      Comment 2 Documented in Chart     GLUCOSE, CAPILLARY     Status: Abnormal   Collection Time   03/25/12  4:51 PM      Component Value Range Comment   Glucose-Capillary 186 (*) 70 - 99 mg/dL   GLUCOSE, CAPILLARY     Status: Abnormal   Collection Time   03/25/12  8:53 PM      Component  Value Range Comment   Glucose-Capillary 101 (*) 70 - 99 mg/dL    Comment 1 Notify RN     PROTIME-INR     Status: Normal   Collection Time   03/26/12  6:25 AM      Component Value Range Comment   Prothrombin Time 13.8  11.6 - 15.2 seconds    INR 1.07  0.00 - 1.49   GLUCOSE, CAPILLARY     Status: Abnormal   Collection Time   03/26/12  7:37 AM      Component Value Range Comment   Glucose-Capillary 52 (*) 70 - 99 mg/dL    Comment 1 Notify RN     GLUCOSE, CAPILLARY     Status: Abnormal   Collection Time   03/26/12  8:24 AM      Component Value Range Comment   Glucose-Capillary 128 (*) 70 - 99 mg/dL    Comment 1 Documented in Chart      Comment 2 Notify RN     GLUCOSE, CAPILLARY     Status: Abnormal   Collection Time   03/26/12 11:38 AM      Component Value Range Comment   Glucose-Capillary 52 (*) 70 - 99 mg/dL    Comment 1 Documented in Chart  Comment 2 Notify RN     GLUCOSE, CAPILLARY     Status: Abnormal   Collection Time   03/26/12 12:44 PM      Component Value Range Comment   Glucose-Capillary 118 (*) 70 - 99 mg/dL    Comment 1 Documented in Chart      Comment 2 Notify RN     GLUCOSE, CAPILLARY     Status: Abnormal   Collection Time   03/26/12  4:16 PM      Component Value Range Comment   Glucose-Capillary 67 (*) 70 - 99 mg/dL    Comment 1 Documented in Chart      Comment 2 Notify RN     GLUCOSE, CAPILLARY     Status: Normal   Collection Time   03/26/12  5:19 PM      Component Value Range Comment   Glucose-Capillary 78  70 - 99 mg/dL    Comment 1 Documented in Chart      Comment 2 Notify RN     GLUCOSE, CAPILLARY     Status: Normal   Collection Time   03/26/12  8:55 PM      Component Value Range Comment   Glucose-Capillary 78  70 - 99 mg/dL    Comment 1 Notify RN     PROTIME-INR     Status: Abnormal   Collection Time   03/27/12  6:38 AM      Component Value Range Comment   Prothrombin Time 15.4 (*) 11.6 - 15.2 seconds    INR 1.24  0.00 - 1.49   CBC WITH  DIFFERENTIAL     Status: Abnormal   Collection Time   03/27/12  6:38 AM      Component Value Range Comment   WBC 22.5 (*) 4.0 - 10.5 K/uL    RBC 4.33  4.22 - 5.81 MIL/uL    Hemoglobin 11.6 (*) 13.0 - 17.0 g/dL    HCT 16.1 (*) 09.6 - 52.0 %    MCV 84.5  78.0 - 100.0 fL    MCH 26.8  26.0 - 34.0 pg    MCHC 31.7  30.0 - 36.0 g/dL    RDW 04.5  40.9 - 81.1 %    Platelets 221  150 - 400 K/uL    Neutrophils Relative 77  43 - 77 %    Lymphocytes Relative 13  12 - 46 %    Monocytes Relative 8  3 - 12 %    Eosinophils Relative 2  0 - 5 %    Basophils Relative 0  0 - 1 %    Neutro Abs 17.3 (*) 1.7 - 7.7 K/uL    Lymphs Abs 2.9  0.7 - 4.0 K/uL    Monocytes Absolute 1.8 (*) 0.1 - 1.0 K/uL    Eosinophils Absolute 0.5  0.0 - 0.7 K/uL    Basophils Absolute 0.0  0.0 - 0.1 K/uL    RBC Morphology POLYCHROMASIA PRESENT     COMPREHENSIVE METABOLIC PANEL     Status: Abnormal   Collection Time   03/27/12  6:38 AM      Component Value Range Comment   Sodium 135  135 - 145 mEq/L    Potassium 4.4  3.5 - 5.1 mEq/L    Chloride 100  96 - 112 mEq/L    CO2 28  19 - 32 mEq/L    Glucose, Bld 47 (*) 70 - 99 mg/dL    BUN 16  6 - 23 mg/dL  Creatinine, Ser 0.92  0.50 - 1.35 mg/dL    Calcium 9.6  8.4 - 16.1 mg/dL    Total Protein 7.3  6.0 - 8.3 g/dL    Albumin 1.9 (*) 3.5 - 5.2 g/dL    AST 37  0 - 37 U/L    ALT 26  0 - 53 U/L    Alkaline Phosphatase 260 (*) 39 - 117 U/L    Total Bilirubin 0.3  0.3 - 1.2 mg/dL    GFR calc non Af Amer >90  >90 mL/min    GFR calc Af Amer >90  >90 mL/min   GLUCOSE, CAPILLARY     Status: Abnormal   Collection Time   03/27/12  7:12 AM      Component Value Range Comment   Glucose-Capillary 42 (*) 70 - 99 mg/dL    Comment 1 Notify RN     GLUCOSE, CAPILLARY     Status: Abnormal   Collection Time   03/27/12  7:33 AM      Component Value Range Comment   Glucose-Capillary 63 (*) 70 - 99 mg/dL    Comment 1 Notify RN     GLUCOSE, CAPILLARY     Status: Normal   Collection Time    03/27/12  8:14 AM      Component Value Range Comment   Glucose-Capillary 90  70 - 99 mg/dL    Comment 1 Notify RN     GLUCOSE, CAPILLARY     Status: Abnormal   Collection Time   03/27/12 11:55 AM      Component Value Range Comment   Glucose-Capillary 44 (*) 70 - 99 mg/dL    Comment 1 Notify RN     GLUCOSE, CAPILLARY     Status: Abnormal   Collection Time   03/27/12 12:28 PM      Component Value Range Comment   Glucose-Capillary 64 (*) 70 - 99 mg/dL    Comment 1 Notify RN     GLUCOSE, CAPILLARY     Status: Normal   Collection Time   03/27/12  1:01 PM      Component Value Range Comment   Glucose-Capillary 82  70 - 99 mg/dL    Comment 1 Notify RN     GLUCOSE, CAPILLARY     Status: Normal   Collection Time   03/27/12  9:15 PM      Component Value Range Comment   Glucose-Capillary 78  70 - 99 mg/dL   PROTIME-INR     Status: Abnormal   Collection Time   03/28/12  6:05 AM      Component Value Range Comment   Prothrombin Time 19.3 (*) 11.6 - 15.2 seconds    INR 1.69 (*) 0.00 - 1.49      HEENT: normal Cardio: RRR Resp: CTA B/L GI: BS positive Extremity:  No Edema Skin:   Wound Coban wrap. Neuro: Alert/Oriented, Abnormal Sensory reduce R toes and Normal Motor Musc/Skel:  Other L BKA, Coban wrap- well shaped limb. Appropriately tender   Assessment/Plan: 1. Functional deficits secondary to L BKA due to diabetes and gangrene which require 3+ hours per day of interdisciplinary therapy in a comprehensive inpatient rehab setting. Physiatrist is providing close team supervision and 24 hour management of active medical problems listed below. Physiatrist and rehab team continue to assess barriers to discharge/monitor patient progress toward functional and medical goals. FIM: FIM - Bathing Bathing Steps Patient Completed: Chest;Right Arm;Left Arm;Abdomen;Front perineal area;Buttocks;Right lower leg (including foot);Left upper leg;Right upper  leg;Left lower leg (including foot) Bathing: 4:  Steadying assist  FIM - Upper Body Dressing/Undressing Upper body dressing/undressing steps patient completed: Thread/unthread right sleeve of pullover shirt/dresss;Thread/unthread left sleeve of pullover shirt/dress;Put head through opening of pull over shirt/dress;Pull shirt over trunk Upper body dressing/undressing: 5: Set-up assist to: Obtain clothing/put away FIM - Lower Body Dressing/Undressing Lower body dressing/undressing steps patient completed: Thread/unthread right underwear leg;Thread/unthread left underwear leg;Pull underwear up/down;Thread/unthread right pants leg;Thread/unthread left pants leg;Pull pants up/down;Don/Doff right sock Lower body dressing/undressing: 5: Set-up assist to: Obtain clothing  FIM - Toileting Toileting steps completed by patient: Performs perineal hygiene Toileting: 2: Max-Patient completed 1 of 3 steps  FIM - Diplomatic Services operational officer Devices: Bedside commode (BSC over toilet) Toilet Transfers: 4-To toilet/BSC: Min A (steadying Pt. > 75%);4-From toilet/BSC: Min A (steadying Pt. > 75%)  FIM - Bed/Chair Transfer Bed/Chair Transfer Assistive Devices: Arm rests;Bed rails Bed/Chair Transfer: 6: Assistive device: no helper;5: Chair or W/C > Bed: Supervision (verbal cues/safety issues);5: Bed > Chair or W/C: Supervision (verbal cues/safety issues);6: Sit > Supine: No assist  FIM - Locomotion: Wheelchair Distance: 150' Locomotion: Wheelchair: 5: Travels 150 ft or more: maneuvers on rugs and over door sills with supervision, cueing or coaxing FIM - Locomotion: Ambulation Locomotion: Ambulation Assistive Devices: Designer, industrial/product Ambulation/Gait Assistance: 4: Min assist Locomotion: Ambulation: 1: Travels less than 50 ft with minimal assistance (Pt.>75%)  Comprehension Comprehension Mode: Auditory Comprehension: 7-Follows complex conversation/direction: With no assist  Expression Expression Mode: Verbal Expression: 7-Expresses  complex ideas: With no assist  Social Interaction Social Interaction: 7-Interacts appropriately with others - No medications needed.  Problem Solving Problem Solving: 7-Solves complex problems: Recognizes & self-corrects  Memory Memory: 7-Complete Independence: No helper   Medical Problem List and Plan:  1. Left below-knee amputation secondary to peripheral vascular disease 03/22/2012  2. DVT Prophylaxis/Anticoagulation: Coumadin for DVT prophylaxis. Monitor for any bleeding episodes  3. Pain Management: Oxycodone as well as Robaxin as needed. Neurontin 400 mg 3 times a day. Will titrate medications  4. Neuropsych: This patient is capable of making decisions on his/her own behalf.  5. History of renal transplant. Continue immunosuppressant medication  6. 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 Hemoglobin A1c of 11.2. Sugars quite low.hold all scheduled insulin  -we discussed the impact of dietary adherence on glycemic control      7. Hypertension. Lopressor 25 mg twice a day. Monitor with increased mobility  8. Hyperlipidemia. Lovaza, Lipitor   LOS (Days) 4 A FACE TO FACE EVALUATION WAS PERFORMED  Amire Leazer T 03/28/2012, 6:42 AM

## 2012-03-29 ENCOUNTER — Inpatient Hospital Stay (HOSPITAL_COMMUNITY): Payer: Medicare Other | Admitting: Occupational Therapy

## 2012-03-29 ENCOUNTER — Inpatient Hospital Stay (HOSPITAL_COMMUNITY): Payer: Medicare Other | Admitting: Physical Therapy

## 2012-03-29 ENCOUNTER — Inpatient Hospital Stay (HOSPITAL_COMMUNITY): Payer: Medicare Other

## 2012-03-29 DIAGNOSIS — L98499 Non-pressure chronic ulcer of skin of other sites with unspecified severity: Secondary | ICD-10-CM

## 2012-03-29 DIAGNOSIS — Z5189 Encounter for other specified aftercare: Secondary | ICD-10-CM

## 2012-03-29 DIAGNOSIS — I739 Peripheral vascular disease, unspecified: Secondary | ICD-10-CM

## 2012-03-29 DIAGNOSIS — S88119A Complete traumatic amputation at level between knee and ankle, unspecified lower leg, initial encounter: Secondary | ICD-10-CM

## 2012-03-29 LAB — URINALYSIS, ROUTINE W REFLEX MICROSCOPIC
Ketones, ur: NEGATIVE mg/dL
Leukocytes, UA: NEGATIVE
Protein, ur: NEGATIVE mg/dL
Urobilinogen, UA: 0.2 mg/dL (ref 0.0–1.0)

## 2012-03-29 LAB — CBC WITH DIFFERENTIAL/PLATELET
Basophils Absolute: 0 10*3/uL (ref 0.0–0.1)
Eosinophils Absolute: 0.5 10*3/uL (ref 0.0–0.7)
Lymphocytes Relative: 10 % — ABNORMAL LOW (ref 12–46)
MCHC: 32.5 g/dL (ref 30.0–36.0)
Monocytes Absolute: 2.8 10*3/uL — ABNORMAL HIGH (ref 0.1–1.0)
Neutrophils Relative %: 77 % (ref 43–77)
Platelets: 264 10*3/uL (ref 150–400)
RDW: 14 % (ref 11.5–15.5)

## 2012-03-29 LAB — GLUCOSE, CAPILLARY
Glucose-Capillary: 177 mg/dL — ABNORMAL HIGH (ref 70–99)
Glucose-Capillary: 73 mg/dL (ref 70–99)

## 2012-03-29 LAB — PROTIME-INR
INR: 1.75 — ABNORMAL HIGH (ref 0.00–1.49)
Prothrombin Time: 19.8 seconds — ABNORMAL HIGH (ref 11.6–15.2)

## 2012-03-29 MED ORDER — WARFARIN SODIUM 7.5 MG PO TABS
7.5000 mg | ORAL_TABLET | Freq: Once | ORAL | Status: AC
  Administered 2012-03-29: 7.5 mg via ORAL
  Filled 2012-03-29: qty 1

## 2012-03-29 MED ORDER — LOPERAMIDE HCL 2 MG PO CAPS
2.0000 mg | ORAL_CAPSULE | ORAL | Status: DC | PRN
  Administered 2012-03-29: 2 mg via ORAL
  Filled 2012-03-29: qty 1

## 2012-03-29 NOTE — Progress Notes (Addendum)
Occupational Therapy Session Note  Patient Details  Name: Trevor Thompson MRN: [id number] Date of Birth: Nov 08, 1960  Today's Date: 03/29/2012 Time: 4098-1191 Time Calculation (min): 38 min  Short Term Goals: Week 1:     Skilled Therapeutic Interventions/Progress Updates:    #1) Pt seen this am for individual OT treatment session w/ focus on ADL Re-training. Pt declined/refused bathing at shower level but was agreeable to bathe/dress UB at sink level w/ occasional VC's for safety w/ functional mobility. Pt was w/ 1 LOB during sit to stand from 3:1 over toliet in bathroom when adjusting his clothing and required Min Guard A to self correct. Pt then performed tub transfers and functional mobility in ADL apartment w/ focus on static & dynamic standing balance. He benefits from occasional VC's for safety & positioning of RW.   #2) 13:00-13:30 30 Min) Pt seen this afternoon for individual OT treatment session with focus on functional mobility/transfers in room, toileting, toileting transfer, dynamic standing balance w/ supervision & occasional VC's. Pt also performed bilateral UE strengthening exercises w/ theraband and 5# wt while seated in w/c. Verbal discussion of shoulder stretching secondary to pt c/o soreness w/ increased performance of transfers. Pt rated LLE leg pain as 7/10, RN made aware, repositioned. Pt seated in w/c in room at conclusion of session w/ call bell & phone w/in reach.  Therapy Documentation Precautions:  Precautions Precautions: Fall Restrictions Weight Bearing Restrictions: Yes LLE Weight Bearing: Non weight bearing:     Pain: Pain Assessment Pain Assessment: 0-10 Pain Score:   4 Pain Type: Surgical pain Pain Location: Leg Pain Orientation: Left Pain Intervention(s): Repositioned       See FIM for current functional status  Therapy/Group: Individual Therapy  Alm Bustard 03/29/2012, 12:46 PM

## 2012-03-29 NOTE — Progress Notes (Signed)
Physical Therapy Session Note  Patient Details  Name: Trevor Thompson MRN: [id number] Date of Birth: December 20, 1960  Today's Date: 03/29/2012 Time: 1000-1045 Time Calculation (min): 45 min  Short Term Goals: Week 1:  PT Short Term Goal 1 (Week 1): = long term goals   Treatment session with main focus on stair negotiation with RW for 3 curb steps into the house. Able to complete with steady A, cueing for walker placement and safety. Discussed need to demo with pt's wife and she is planning to come this afternoon for therapy sessions. Pt wants to defer practicing floor transfer until afternoon session when she is present. Gait with RW with close S x 50', cueing during turns to slow down and pay attention to positioning of RW. Core and UE strengthening with yellow weighted medicine ball in PNF diagonals 3 sets of 10 reps each direction. W/c propulsion mod I on unit to/from therapy for endurance and strengthening.     Therapy Documentation Precautions:  Precautions Precautions: Fall Restrictions Weight Bearing Restrictions: Yes LLE Weight Bearing: Non weight bearing   Pain: C/o ongoing residual limb pain - plans to wait til end of session for pain medication   Locomotion : Ambulation Ambulation/Gait Assistance: 5: Supervision   See FIM for current functional status  Therapy/Group: Individual Therapy  Karolee Stamps [facility] 03/29/2012, 10:49 AM

## 2012-03-29 NOTE — Significant Event (Signed)
Hypoglycemic Event  CBG: 67  Treatment: 15 GM carbohydrate snack  Symptoms: None  Follow-up CBG: Time:1147 CBG Result:69  Possible Reasons for Event: Unknown  Comments/MD notified:N/A responding to treatment    Roberts-VonCannon, Trevor Thompson  Remember to initiate Hypoglycemia Order Set & complete

## 2012-03-29 NOTE — Progress Notes (Addendum)
Hypoglycemic Event  CBG: 69  Treatment: 15 GM carbohydrate snack  Symptoms: None  Follow-up CBG: Time:1156 CBG Result:73  Possible Reasons for Event: Unknown  Comments/MD notified:n/a, responded to treatment given    Trevor Thompson, Trevor Thompson  Remember to initiate Hypoglycemia Order Set & complete

## 2012-03-29 NOTE — Progress Notes (Signed)
ANTICOAGULATION CONSULT NOTE - Follow Up Consult  Pharmacy Consult:  Coumadin Indication: VTE prophylaxis  Allergies  Allergen Reactions  . Penicillins Other (See Comments)    Since birth    Patient Measurements: Height: 5\' 11"  (180.3 cm) Weight: 242 lb 8 oz (109.997 kg) IBW/kg (Calculated) : 75.3    Vital Signs: Temp: 98.8 F (37.1 C) (09/25 0500) Temp src: Oral (09/25 0500) BP: 145/82 mmHg (09/25 0500) Pulse Rate: 91  (09/25 0500)  Labs:  Trevor Thompson 03/29/12 4098 03/28/12 0605 03/27/12 0638  HGB -- -- 11.6*  HCT -- -- 36.6*  PLT -- -- 221  APTT -- -- --  LABPROT 19.8* 19.3* 15.4*  INR 1.75* 1.69* 1.24  HEPARINUNFRC -- -- --  CREATININE -- -- 0.92  CKTOTAL -- -- --  CKMB -- -- --  TROPONINI -- -- --    Estimated Creatinine Clearance: 118.5 ml/min (by C-G formula based on Cr of 0.92).     Assessment: 51 y/o male patient admitted with gangrenous left foot s/p left BKA requiring Coumadin for VTE prophylaxis. INR increased minimally.  No bleeding documented.   Goal of Therapy:  INR 2-3 Monitor platelets by anticoagulation protocol: Yes    Plan:  - Coumadin 7.5mg  PO today - Daily PT / INR    Esmae Donathan D. Laney Potash, PharmD, BCPS Pager:  [phone] 03/29/2012, 8:45 AM

## 2012-03-29 NOTE — Progress Notes (Signed)
Patient ID: Trevor Thompson, male   DOB: 01-07-1961, 51 y.o.   MRN: [id number] Serjio Deupree is a 51 y.o. right-handed male with history of diabetes mellitus with peripheral neuropathy, renal transplant 2004, recent left second toe amputation secondary to peripheral vascular disease 02/25/2012. Patient was independent prior to admission. Admitted 03/22/2012 with nonhealing left foot and no change with conservative care. Limb was not felt to be salvageable. 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 Lajoyce Corners. Placed on Coumadin for DVT prophylaxis  Subjective/Complaints:   sugars remain low. Pain under control.  Review of Systems  Musculoskeletal: Positive for myalgias.  Neurological: Positive for dizziness. Negative for sensory change.  All other systems reviewed and are negative.    Objective: Vital Signs: Blood pressure 145/82, pulse 91, temperature 98.8 F (37.1 C), temperature source Oral, resp. rate 19, height 5\' 11"  (1.803 m), weight 109.997 kg (242 lb 8 oz), SpO2 96.00%. No results found. Results for orders placed during the hospital encounter of 03/24/12 (from the past 72 hour(s))  GLUCOSE, CAPILLARY     Status: Abnormal   Collection Time   03/26/12 11:38 AM      Component Value Range Comment   Glucose-Capillary 52 (*) 70 - 99 mg/dL    Comment 1 Documented in Chart      Comment 2 Notify RN     GLUCOSE, CAPILLARY     Status: Abnormal   Collection Time   03/26/12 12:44 PM      Component Value Range Comment   Glucose-Capillary 118 (*) 70 - 99 mg/dL    Comment 1 Documented in Chart      Comment 2 Notify RN     GLUCOSE, CAPILLARY     Status: Abnormal   Collection Time   03/26/12  4:16 PM      Component Value Range Comment   Glucose-Capillary 67 (*) 70 - 99 mg/dL    Comment 1 Documented in Chart      Comment 2 Notify RN     GLUCOSE, CAPILLARY     Status: Normal   Collection Time   03/26/12  5:19 PM      Component Value Range Comment   Glucose-Capillary 78  70 - 99 mg/dL      Comment 1 Documented in Chart      Comment 2 Notify RN     GLUCOSE, CAPILLARY     Status: Normal   Collection Time   03/26/12  8:55 PM      Component Value Range Comment   Glucose-Capillary 78  70 - 99 mg/dL    Comment 1 Notify RN     PROTIME-INR     Status: Abnormal   Collection Time   03/27/12  6:38 AM      Component Value Range Comment   Prothrombin Time 15.4 (*) 11.6 - 15.2 seconds    INR 1.24  0.00 - 1.49   CBC WITH DIFFERENTIAL     Status: Abnormal   Collection Time   03/27/12  6:38 AM      Component Value Range Comment   WBC 22.5 (*) 4.0 - 10.5 K/uL    RBC 4.33  4.22 - 5.81 MIL/uL    Hemoglobin 11.6 (*) 13.0 - 17.0 g/dL    HCT 40.9 (*) 81.1 - 52.0 %    MCV 84.5  78.0 - 100.0 fL    MCH 26.8  26.0 - 34.0 pg    MCHC 31.7  30.0 - 36.0 g/dL    RDW 13.9  11.5 - 15.5 %    Platelets 221  150 - 400 K/uL    Neutrophils Relative 77  43 - 77 %    Lymphocytes Relative 13  12 - 46 %    Monocytes Relative 8  3 - 12 %    Eosinophils Relative 2  0 - 5 %    Basophils Relative 0  0 - 1 %    Neutro Abs 17.3 (*) 1.7 - 7.7 K/uL    Lymphs Abs 2.9  0.7 - 4.0 K/uL    Monocytes Absolute 1.8 (*) 0.1 - 1.0 K/uL    Eosinophils Absolute 0.5  0.0 - 0.7 K/uL    Basophils Absolute 0.0  0.0 - 0.1 K/uL    RBC Morphology POLYCHROMASIA PRESENT     COMPREHENSIVE METABOLIC PANEL     Status: Abnormal   Collection Time   03/27/12  6:38 AM      Component Value Range Comment   Sodium 135  135 - 145 mEq/L    Potassium 4.4  3.5 - 5.1 mEq/L    Chloride 100  96 - 112 mEq/L    CO2 28  19 - 32 mEq/L    Glucose, Bld 47 (*) 70 - 99 mg/dL    BUN 16  6 - 23 mg/dL    Creatinine, Ser 1.61  0.50 - 1.35 mg/dL    Calcium 9.6  8.4 - 09.6 mg/dL    Total Protein 7.3  6.0 - 8.3 g/dL    Albumin 1.9 (*) 3.5 - 5.2 g/dL    AST 37  0 - 37 U/L    ALT 26  0 - 53 U/L    Alkaline Phosphatase 260 (*) 39 - 117 U/L    Total Bilirubin 0.3  0.3 - 1.2 mg/dL    GFR calc non Af Amer >90  >90 mL/min    GFR calc Af Amer >90  >90  mL/min   GLUCOSE, CAPILLARY     Status: Abnormal   Collection Time   03/27/12  7:12 AM      Component Value Range Comment   Glucose-Capillary 42 (*) 70 - 99 mg/dL    Comment 1 Notify RN     GLUCOSE, CAPILLARY     Status: Abnormal   Collection Time   03/27/12  7:33 AM      Component Value Range Comment   Glucose-Capillary 63 (*) 70 - 99 mg/dL    Comment 1 Notify RN     GLUCOSE, CAPILLARY     Status: Normal   Collection Time   03/27/12  8:14 AM      Component Value Range Comment   Glucose-Capillary 90  70 - 99 mg/dL    Comment 1 Notify RN     GLUCOSE, CAPILLARY     Status: Abnormal   Collection Time   03/27/12 11:55 AM      Component Value Range Comment   Glucose-Capillary 44 (*) 70 - 99 mg/dL    Comment 1 Notify RN     GLUCOSE, CAPILLARY     Status: Abnormal   Collection Time   03/27/12 12:28 PM      Component Value Range Comment   Glucose-Capillary 64 (*) 70 - 99 mg/dL    Comment 1 Notify RN     GLUCOSE, CAPILLARY     Status: Normal   Collection Time   03/27/12  1:01 PM      Component Value Range Comment   Glucose-Capillary 82  70 -  99 mg/dL    Comment 1 Notify RN     GLUCOSE, CAPILLARY     Status: Normal   Collection Time   03/27/12  9:15 PM      Component Value Range Comment   Glucose-Capillary 78  70 - 99 mg/dL   PROTIME-INR     Status: Abnormal   Collection Time   03/28/12  6:05 AM      Component Value Range Comment   Prothrombin Time 19.3 (*) 11.6 - 15.2 seconds    INR 1.69 (*) 0.00 - 1.49   GLUCOSE, CAPILLARY     Status: Normal   Collection Time   03/28/12  7:13 AM      Component Value Range Comment   Glucose-Capillary 80  70 - 99 mg/dL    Comment 1 Notify RN     GLUCOSE, CAPILLARY     Status: Normal   Collection Time   03/28/12 11:42 AM      Component Value Range Comment   Glucose-Capillary 83  70 - 99 mg/dL    Comment 1 Notify RN     GLUCOSE, CAPILLARY     Status: Normal   Collection Time   03/28/12  4:26 PM      Component Value Range Comment    Glucose-Capillary 97  70 - 99 mg/dL   GLUCOSE, CAPILLARY     Status: Abnormal   Collection Time   03/28/12  9:32 PM      Component Value Range Comment   Glucose-Capillary 132 (*) 70 - 99 mg/dL   PROTIME-INR     Status: Abnormal   Collection Time   03/29/12  6:35 AM      Component Value Range Comment   Prothrombin Time 19.8 (*) 11.6 - 15.2 seconds    INR 1.75 (*) 0.00 - 1.49   GLUCOSE, CAPILLARY     Status: Abnormal   Collection Time   03/29/12  7:14 AM      Component Value Range Comment   Glucose-Capillary 177 (*) 70 - 99 mg/dL    Comment 1 Notify RN        HEENT: normal Cardio: RRR Resp: CTA B/L GI: BS positive Extremity:  No Edema Skin:   Wound Coban wrap. Neuro: Alert/Oriented, Abnormal Sensory reduce R toes and Normal Motor Musc/Skel:  Other L BKA, Coban wrap- well shaped limb. Appropriately tender   Assessment/Plan: 1. Functional deficits secondary to L BKA due to diabetes and gangrene which require 3+ hours per day of interdisciplinary therapy in a comprehensive inpatient rehab setting. Physiatrist is providing close team supervision and 24 hour management of active medical problems listed below. Physiatrist and rehab team continue to assess barriers to discharge/monitor patient progress toward functional and medical goals. FIM: FIM - Bathing Bathing Steps Patient Completed: Chest;Right Arm;Left Arm;Abdomen;Front perineal area;Buttocks;Right lower leg (including foot);Left upper leg;Right upper leg;Left lower leg (including foot) Bathing: 4: Steadying assist  FIM - Upper Body Dressing/Undressing Upper body dressing/undressing steps patient completed: Thread/unthread right sleeve of pullover shirt/dresss;Thread/unthread left sleeve of pullover shirt/dress;Put head through opening of pull over shirt/dress;Pull shirt over trunk Upper body dressing/undressing: 5: Set-up assist to: Obtain clothing/put away FIM - Lower Body Dressing/Undressing Lower body dressing/undressing  steps patient completed: Thread/unthread right underwear leg;Thread/unthread left underwear leg;Pull underwear up/down;Thread/unthread right pants leg;Thread/unthread left pants leg;Pull pants up/down;Don/Doff right sock Lower body dressing/undressing: 5: Set-up assist to: Obtain clothing  FIM - Toileting Toileting steps completed by patient: Performs perineal hygiene Toileting: 2: Max-Patient completed 1 of  3 steps  FIM - Diplomatic Services operational officer Devices: Bedside commode (BSC over toilet) Toilet Transfers: 4-To toilet/BSC: Min A (steadying Pt. > 75%);4-From toilet/BSC: Min A (steadying Pt. > 75%)  FIM - Bed/Chair Transfer Bed/Chair Transfer Assistive Devices: Arm rests;Walker Bed/Chair Transfer: 6: Supine > Sit: No assist;7: Sit > Supine: No assist;5: Bed > Chair or W/C: Supervision (verbal cues/safety issues);5: Chair or W/C > Bed: Supervision (verbal cues/safety issues)  FIM - Locomotion: Wheelchair Distance: 200 Locomotion: Wheelchair: 5: Travels 150 ft or more: maneuvers on rugs and over door sills with supervision, cueing or coaxing FIM - Locomotion: Ambulation Locomotion: Ambulation Assistive Devices: Designer, industrial/product Ambulation/Gait Assistance: 5: Supervision Locomotion: Ambulation: 2: Travels 50 - 149 ft with supervision/safety issues  Comprehension Comprehension Mode: Auditory Comprehension: 7-Follows complex conversation/direction: With no assist  Expression Expression Mode: Verbal Expression: 7-Expresses complex ideas: With no assist  Social Interaction Social Interaction: 6-Interacts appropriately with others with medication or extra time (anti-anxiety, antidepressant).  Problem Solving Problem Solving: 7-Solves complex problems: Recognizes & self-corrects  Memory Memory: 7-Complete Independence: No helper   Medical Problem List and Plan:  1. Left below-knee amputation secondary to peripheral vascular disease 03/22/2012  2. DVT  Prophylaxis/Anticoagulation: Coumadin for DVT prophylaxis. Monitor for any bleeding episodes  3. Pain Management: Oxycodone as well as Robaxin as needed. Neurontin 400 mg 3 times a day appears effective 4. Neuropsych: This patient is capable of making decisions on his/her own behalf.  5. History of renal transplant. Continue immunosuppressant medication  6. 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 Hemoglobin A1c of 11.2. Sugars quite low.hold all scheduled insulin  -all scheduled meds held and sugars still low yesterday  -watch today      7. Hypertension. Lopressor 25 mg twice a day. Monitor with increased mobility  8. Hyperlipidemia. Lovaza, Lipitor 9. Leukocytosis: recheck tomorrow  -check urine today  -remove dressing in the am    LOS (Days) 5 A FACE TO FACE EVALUATION WAS PERFORMED  Corlene Sabia T 03/29/2012, 8:32 AM

## 2012-03-29 NOTE — Progress Notes (Signed)
Occupational Therapy Session Note  Patient Details  Name: Trevor Thompson MRN: [id number] Date of Birth: October 27, 1960  Today's Date: 03/29/2012 Time: 1130-1200 Time Calculation (min): 30 min  Skilled Therapeutic Interventions/Progress Updates:    Worked on static standing balance at high/low table while engaged in activity with bilateral UEs.  Pt able to maintain standing for 3-4 mins before needing rest break.  Pt able to maintain balance with close supervision throughout the task.  When attempting to use BUEs at the same time he attempts to compensate by using his stomach up against the table to help him balance.  CBG taken during session at 69.  Staff provided beverage and CBG taken again at 80.  Pt with no reports of extreme dizziness or fatigue.  Returned to room at end of session and left in wheelchair per pt's request.  Therapy Documentation Precautions:  Precautions Precautions: Fall Restrictions Weight Bearing Restrictions: Yes LLE Weight Bearing: Non weight bearing  Pain: Pain Assessment Pain Assessment: 0-10 Pain Score:   4 Pain Type: Surgical pain Pain Location: Leg Pain Orientation: Left Pain Intervention(s): Repositioned ADL: See FIM for current functional status  Therapy/Group: Individual Therapy  Waldron Gerry OTR/L 03/29/2012, 12:32 PM

## 2012-03-30 ENCOUNTER — Inpatient Hospital Stay (HOSPITAL_COMMUNITY): Payer: Medicare Other | Admitting: Occupational Therapy

## 2012-03-30 ENCOUNTER — Inpatient Hospital Stay (HOSPITAL_COMMUNITY): Payer: Medicare Other

## 2012-03-30 ENCOUNTER — Inpatient Hospital Stay (HOSPITAL_COMMUNITY): Payer: Medicare Other | Admitting: *Deleted

## 2012-03-30 LAB — GLUCOSE, CAPILLARY
Glucose-Capillary: 139 mg/dL — ABNORMAL HIGH (ref 70–99)
Glucose-Capillary: 157 mg/dL — ABNORMAL HIGH (ref 70–99)
Glucose-Capillary: 152 mg/dL — ABNORMAL HIGH (ref 70–99)

## 2012-03-30 LAB — URINE CULTURE: Culture: NO GROWTH

## 2012-03-30 LAB — PROTIME-INR: INR: 2 — ABNORMAL HIGH (ref 0.00–1.49)

## 2012-03-30 MED ORDER — WARFARIN SODIUM 7.5 MG PO TABS
7.5000 mg | ORAL_TABLET | Freq: Once | ORAL | Status: AC
  Administered 2012-03-30: 7.5 mg via ORAL
  Filled 2012-03-30: qty 1

## 2012-03-30 NOTE — Progress Notes (Signed)
Occupational Therapy Session Note  Patient Details  Name: Sidney Kann MRN: [id number] Date of Birth: [birth year]  Today's Date: 03/30/2012 Time: 4098-1191 Time Calculation (min): 44 min  Skilled Therapeutic Interventions/Progress Updates:    Worked on dynamic and static standing using his RW.  Performed intervals of sit to stand repeatedly with close supervision throughout session.  Performed standing while using each hand to hold and use WII controller without requiring assist to regain balance.  Pt with more difficulty releasing the LUE and performing task than with the RUE.    Therapy Documentation Precautions:  Precautions Precautions: Fall Restrictions Weight Bearing Restrictions: Yes LLE Weight Bearing: Non weight bearing  Pain: Pain Assessment Pain Assessment: 0-10 Pain Score:   3 Pain Type: Surgical pain Pain Location: Leg Pain Orientation: Left Pain Intervention(s): Medication (See eMAR) ADL:  See FIM for current functional status  Therapy/Group: Individual Therapy  Kandis Henry OTR/L Pager number [phone] 03/30/2012, 1:20 PM

## 2012-03-30 NOTE — Progress Notes (Addendum)
ANTICOAGULATION CONSULT NOTE - Follow Up Consult  Pharmacy Consult:  Coumadin Indication: VTE prophylaxis  Allergies  Allergen Reactions  . Penicillins Other (See Comments)    Since birth    Patient Measurements: Height: 5\' 11"  (180.3 cm) Weight: 231 lb 11.3 oz (105.1 kg) (standing scale) IBW/kg (Calculated) : 75.3    Vital Signs: Temp: 98.3 F (36.8 C) (09/26 0521) Temp src: Oral (09/26 0521) BP: 168/94 mmHg (09/26 0521) Pulse Rate: 92  (09/26 0521)  Labs:  Basename 03/30/12 0540 03/29/12 1102 03/29/12 0635 03/28/12 0605  HGB -- 11.6* -- --  HCT -- 35.7* -- --  PLT -- 264 -- --  APTT -- -- -- --  LABPROT 21.9* -- 19.8* 19.3*  INR 2.00* -- 1.75* 1.69*  HEPARINUNFRC -- -- -- --  CREATININE -- -- -- --  CKTOTAL -- -- -- --  CKMB -- -- -- --  TROPONINI -- -- -- --    Estimated Creatinine Clearance: 115.8 ml/min (by C-G formula based on Cr of 0.92).     Assessment: 51 y/o male patient admitted with gangrenous left foot s/p left BKA requiring Coumadin for VTE prophylaxis. INR therapeutic today; no bleeding reported.    Goal of Therapy:  INR 2-3 Monitor platelets by anticoagulation protocol: Yes    Plan:  - Coumadin 7.5mg  PO today - Daily PT / INR     Celena Lanius D. Laney Potash, PharmD, BCPS Pager:  [phone] 03/30/2012, 9:57 AM

## 2012-03-30 NOTE — Evaluation (Signed)
Recreational Therapy Assessment and Plan  Patient Details  Name: Trevor Thompson MRN: [id number] Date of Birth: 1960-09-23 Today's Date: 03/30/2012  Assessment Clinical Impression: Problem List:  Patient Active Problem List   Diagnosis   .  Cellulitis of left foot   .  Necrotic toe   .  ? PVD (peripheral vascular disease)   .  History of renal transplant   .  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  Insulin dependent type 2 diabetes mellitus, uncontrolled   .  HTN (hypertension)   .  Dehydration as evidenced by hemoconcentration   .  Left foot pain   .  Type 1 diabetes mellitus, uncontrolled   .  Hypertension   .  S/p cadaver renal transplant   .  Cold intolerance   .  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  GERD (gastroesophageal reflux disease)   .  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  Hypertriglyceridemia   .  Diabetic retinopathy   .  Gangrene Associated With Diabetes Mellitus   .  Leukocytosis   .  Autonomic orthostatic hypotension   .  Acute kidney injury   .  Fever   .  S/P BKA (below knee amputation)    Past Medical History:  Past Medical History   Diagnosis  Date   .  Type 1 diabetes mellitus, uncontrolled    .  Hypertension    .  Renal disorder    .  PVD (peripheral vascular disease)    .  S/p cadaver renal transplant    .  Cold intolerance    .  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  GERD (gastroesophageal reflux disease)    .  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  Hypertriglyceridemia    .  Diabetic retinopathy     Past Surgical History:  Past Surgical History   Procedure  Date   .  Kidney transplant  2004     right   .  Cataract extraction w/ intraocular lens implant, bilateral  ~ 1997   .  Av fistula placement  1997-2004     "I've got 3; right upper arm; right forearm; left forearm "   .  Av fistula repair  1997-2004     "multiple"   .  Amputation  02/25/2012     Procedure: AMPUTATION DIGIT;  Surgeon: Larina Earthly, MD; Location: [facility] OR; Service: Vascular; Laterality: Left; Left 2nd Toe Amputation, Incision and Drainage left lateral foot.   .  Amputation  03/22/2012     Procedure: AMPUTATION BELOW KNEE; Surgeon: Nadara Mustard, MD; Location: [facility] OR; Service: Orthopedics; Laterality: Left; Left Below Knee Amputation    Assessment & Plan  Clinical Impression: Trevor Thompson is a 51 y.o. right-handed male with history of diabetes mellitus with peripheral neuropathy, renal transplant 2004, recent left second toe amputation secondary to peripheral vascular disease 02/25/2012. Patient was independent prior to admission. Admitted 03/22/2012 with nonhealing left foot and no change with conservative care. Limb was not felt to be salvageable. 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 Lajoyce Corners. Placed on Coumadin for DVT prophylaxis. Postoperative pain control. Physical and occupational therapy evaluations completed an ongoing. M.D. is requested physical medicine rehabilitation consult to consider inpatient rehabilitation services . Patient transferred to [facility] on 03/24/2012 .   Met with pt and discussed leisure interests and community reintegration.  Pt able to identify >3 activities for participation post discharge.  Pt also able to verbalize potential obstacles he may encounter in community and ways to negotiate them.  Pt also stated energy conservation techniques.  Pt verbalized frustration in coping with current situation (limb loss) as well as his PMH and discussed his need for help with coping.  Discussed the amputee support group and informed team of pt's request for assistance with coping strategies.  No further TR as pt is to discharge home on 05/01/12. Leisure History/Participation Premorbid leisure interest/current participation: Garment/textile technologist - Journalist, newspaper - Press photographer - Travel (Comment) Other Leisure Interests: Television;Videogames;Computer Identified Leisure Barriers:  limited social contacts Leisure Participation Style: Alone Biochemist, clinical Resources: Good-identify 3 post discharge leisure resources Psychosocial / Spiritual Spiritual Interests: Church Stress Management: Fair Patient agreeable to Pet Therapy: No Social interaction - Mood/Behavior: Cooperative Film/video editor for Education?: Yes Strengths/Weaknesses Patient weaknesses: Physical limitations  Plan Recommendations for other services: Neuropsych  The above assessment, treatment plan, treatment alternatives and goals were discussed and mutually agreed upon: by patient  Tinaya Ceballos 03/30/2012, 4:52 PM

## 2012-03-30 NOTE — Progress Notes (Signed)
Patient ID: Trevor Thompson, male   DOB: [birth year], 51 y.o.   MRN: [id number] Left BKA healing well.  I'll f/u in 1 week in office

## 2012-03-30 NOTE — Progress Notes (Signed)
Physical Therapy Session Note  Patient Details  Name: Trevor Thompson MRN: [id number] Date of Birth: July 25, 1960  Today's Date: 03/30/2012 Time: 1130-1202 Time Calculation (min): 32 min  Short Term Goals: Week 1:  PT Short Term Goal 1 (Week 1): = long term goals  Skilled Therapeutic Interventions/Progress Updates:  Patient propelled wheelchair independently to and from gym 150+ feet. Patient ambulated 65 feet with rolling walker and supervision. Patient worked on Print production planner using UE ergometer x 15 minutes at work level 6.5.  Therapy Documentation Precautions:  Precautions Precautions: Fall Restrictions Weight Bearing Restrictions: Yes LLE Weight Bearing: Non weight bearing  Pain: Pain Assessment Pain Assessment: 0-10 Pain Score:   7 Pain Type: Surgical pain Pain Location: Leg (residual limb) Pain Orientation: Left Pain Descriptors: Burning Pain Intervention(s): Medication (See eMAR)  See FIM for current functional status  Therapy/Group: Individual Therapy  Arelia Longest M 03/30/2012, 1:50 PM

## 2012-03-30 NOTE — Progress Notes (Signed)
Occupational Therapy Session Note  Patient Details  Name: Imer Foxworth MRN: [id number] Date of Birth: 27-Feb-1961  Today's Date: 03/30/2012 Time: 1478-2956 Time Calculation (min): 40 min  Short Term Goals = Long Term Goals  Skilled Therapeutic Interventions/Progress Updates:  Patient found seated in w/c beside bed. Engaged in LB dressing in sit->stand position. Patient impulsive with movements and tends to do things his own way, against recommendation of therapist. Patient's wife present for entire session. Focused on family education regarding toilet transfers & toielting (through discussion), overall safety with use of rolling walker during sit/stands & dynamic standing tasks, tub/shower transfer on/off tub transfer bench (verbal education, demonstration, and return demonstration from patient), energy conservation at home during activities/tasks, and HEP for bilateral UEs. Patient left seated in w/c at end of session with wife present in room.   Precautions:  Precautions Precautions: Fall Restrictions Weight Bearing Restrictions: Yes LLE Weight Bearing: Non weight bearing  Pain: Pain Assessment Pain Score:   7 RN made aware  See FIM for current functional status  Therapy/Group: Individual Therapy  Melesa Lecy 03/30/2012, 9:41 AM

## 2012-03-30 NOTE — Progress Notes (Signed)
Physical Therapy Session Note  Patient Details  Name: Trevor Thompson MRN: [id number] Date of Birth: Jul 24, 1960  Today's Date: 03/30/2012 Time: 1300-1340 (40 minutes)  Short Term Goals: Week 1:  PT Short Term Goal 1 (Week 1): = long term goals  Skilled Therapeutic Interventions/Progress Updates:    Pt very flat and seems down on himself this session, frequently stating "I don't care what we do." Practiced up/down curb step with min A; cueing for safety and to slow down, min A needed to maintain balance while transferring RW on/off step. S transfer to mat to work on LE Lexicographer. Quad sets, SAQs, hip flexion, modified bridging, sidelying hip extension, sidelying hip abduction, and prone knee flexion all 3 sets of 10 reps each with rest breaks as needed. W/c propulsion on unit for endurance and strengthening. Pt managing legrests independently.  Therapy Documentation Precautions:  Precautions Precautions: Fall Restrictions Weight Bearing Restrictions: Yes LLE Weight Bearing: Non weight bearing  Pain:  See FIM for current functional status  Therapy/Group: Individual Therapy  Karolee Stamps [facility] 03/30/2012, 2:15 PM

## 2012-03-30 NOTE — Progress Notes (Signed)
Patient ID: Trevor Thompson, male   DOB: 1960/11/01, 51 y.o.   MRN: [id number] Trevor Thompson is a 51 y.o. right-handed male with history of diabetes mellitus with peripheral neuropathy, renal transplant 2004, recent left second toe amputation secondary to peripheral vascular disease 02/25/2012. Patient was independent prior to admission. Admitted 03/22/2012 with nonhealing left foot and no change with conservative care. Limb was not felt to be salvageable. 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 Lajoyce Corners. Placed on Coumadin for DVT prophylaxis  Subjective/Complaints:   no complaints. Pain reasonable.  Review of Systems  Musculoskeletal: Positive for myalgias.  Neurological: Positive for dizziness. Negative for sensory change.  All other systems reviewed and are negative.    Objective: Vital Signs: Blood pressure 168/94, pulse 92, temperature 98.3 F (36.8 C), temperature source Oral, resp. rate 20, height 5\' 11"  (1.803 m), weight 105.1 kg (231 lb 11.3 oz), SpO2 95.00%. No results found. Results for orders placed during the hospital encounter of 03/24/12 (from the past 72 hour(s))  GLUCOSE, CAPILLARY     Status: Abnormal   Collection Time   03/27/12 11:55 AM      Component Value Range Comment   Glucose-Capillary 44 (*) 70 - 99 mg/dL    Comment 1 Notify RN     GLUCOSE, CAPILLARY     Status: Abnormal   Collection Time   03/27/12 12:28 PM      Component Value Range Comment   Glucose-Capillary 64 (*) 70 - 99 mg/dL    Comment 1 Notify RN     GLUCOSE, CAPILLARY     Status: Normal   Collection Time   03/27/12  1:01 PM      Component Value Range Comment   Glucose-Capillary 82  70 - 99 mg/dL    Comment 1 Notify RN     GLUCOSE, CAPILLARY     Status: Normal   Collection Time   03/27/12  4:29 PM      Component Value Range Comment   Glucose-Capillary 99  70 - 99 mg/dL   GLUCOSE, CAPILLARY     Status: Normal   Collection Time   03/27/12  9:15 PM      Component Value Range Comment   Glucose-Capillary 78  70 - 99 mg/dL   PROTIME-INR     Status: Abnormal   Collection Time   03/28/12  6:05 AM      Component Value Range Comment   Prothrombin Time 19.3 (*) 11.6 - 15.2 seconds    INR 1.69 (*) 0.00 - 1.49   GLUCOSE, CAPILLARY     Status: Normal   Collection Time   03/28/12  7:13 AM      Component Value Range Comment   Glucose-Capillary 80  70 - 99 mg/dL    Comment 1 Notify RN     GLUCOSE, CAPILLARY     Status: Normal   Collection Time   03/28/12 11:42 AM      Component Value Range Comment   Glucose-Capillary 83  70 - 99 mg/dL    Comment 1 Notify RN     GLUCOSE, CAPILLARY     Status: Normal   Collection Time   03/28/12  4:26 PM      Component Value Range Comment   Glucose-Capillary 97  70 - 99 mg/dL   GLUCOSE, CAPILLARY     Status: Abnormal   Collection Time   03/28/12  9:32 PM      Component Value Range Comment   Glucose-Capillary 132 (*) 70 -  99 mg/dL   PROTIME-INR     Status: Abnormal   Collection Time   03/29/12  6:35 AM      Component Value Range Comment   Prothrombin Time 19.8 (*) 11.6 - 15.2 seconds    INR 1.75 (*) 0.00 - 1.49   GLUCOSE, CAPILLARY     Status: Abnormal   Collection Time   03/29/12  7:14 AM      Component Value Range Comment   Glucose-Capillary 177 (*) 70 - 99 mg/dL    Comment 1 Notify RN     CBC WITH DIFFERENTIAL     Status: Abnormal   Collection Time   03/29/12 11:02 AM      Component Value Range Comment   WBC 25.6 (*) 4.0 - 10.5 K/uL    RBC 4.21 (*) 4.22 - 5.81 MIL/uL    Hemoglobin 11.6 (*) 13.0 - 17.0 g/dL    HCT 52.8 (*) 41.3 - 52.0 %    MCV 84.8  78.0 - 100.0 fL    MCH 27.6  26.0 - 34.0 pg    MCHC 32.5  30.0 - 36.0 g/dL    RDW 24.4  01.0 - 27.2 %    Platelets 264  150 - 400 K/uL    Neutrophils Relative 77  43 - 77 %    Lymphocytes Relative 10 (*) 12 - 46 %    Monocytes Relative 11  3 - 12 %    Eosinophils Relative 2  0 - 5 %    Basophils Relative 0  0 - 1 %    Neutro Abs 19.7 (*) 1.7 - 7.7 K/uL    Lymphs Abs 2.6  0.7 - 4.0  K/uL    Monocytes Absolute 2.8 (*) 0.1 - 1.0 K/uL    Eosinophils Absolute 0.5  0.0 - 0.7 K/uL    Basophils Absolute 0.0  0.0 - 0.1 K/uL    RBC Morphology POLYCHROMASIA PRESENT     GLUCOSE, CAPILLARY     Status: Abnormal   Collection Time   03/29/12 11:22 AM      Component Value Range Comment   Glucose-Capillary 67 (*) 70 - 99 mg/dL    Comment 1 Notify RN     GLUCOSE, CAPILLARY     Status: Abnormal   Collection Time   03/29/12 11:47 AM      Component Value Range Comment   Glucose-Capillary 69 (*) 70 - 99 mg/dL    Comment 1 Notify RN     GLUCOSE, CAPILLARY     Status: Normal   Collection Time   03/29/12 11:56 AM      Component Value Range Comment   Glucose-Capillary 73  70 - 99 mg/dL    Comment 1 Notify RN     URINALYSIS, ROUTINE W REFLEX MICROSCOPIC     Status: Abnormal   Collection Time   03/29/12  4:23 PM      Component Value Range Comment   Color, Urine YELLOW  YELLOW    APPearance CLEAR  CLEAR    Specific Gravity, Urine 1.005  1.005 - 1.030    pH 5.5  5.0 - 8.0    Glucose, UA NEGATIVE  NEGATIVE mg/dL    Hgb urine dipstick TRACE (*) NEGATIVE    Bilirubin Urine NEGATIVE  NEGATIVE    Ketones, ur NEGATIVE  NEGATIVE mg/dL    Protein, ur NEGATIVE  NEGATIVE mg/dL    Urobilinogen, UA 0.2  0.0 - 1.0 mg/dL    Nitrite NEGATIVE  NEGATIVE  Leukocytes, UA NEGATIVE  NEGATIVE   URINE MICROSCOPIC-ADD ON     Status: Normal   Collection Time   03/29/12  4:23 PM      Component Value Range Comment   Squamous Epithelial / LPF RARE  RARE   GLUCOSE, CAPILLARY     Status: Abnormal   Collection Time   03/29/12  4:52 PM      Component Value Range Comment   Glucose-Capillary 164 (*) 70 - 99 mg/dL   GLUCOSE, CAPILLARY     Status: Abnormal   Collection Time   03/29/12  8:53 PM      Component Value Range Comment   Glucose-Capillary 154 (*) 70 - 99 mg/dL    Comment 1 Notify RN     PROTIME-INR     Status: Abnormal   Collection Time   03/30/12  5:40 AM      Component Value Range Comment    Prothrombin Time 21.9 (*) 11.6 - 15.2 seconds    INR 2.00 (*) 0.00 - 1.49   GLUCOSE, CAPILLARY     Status: Abnormal   Collection Time   03/30/12  7:18 AM      Component Value Range Comment   Glucose-Capillary 139 (*) 70 - 99 mg/dL    Comment 1 Notify RN        HEENT: normal Cardio: RRR Resp: CTA B/L GI: BS positive Extremity:  No Edema Skin:   Wound well approximate, small area of blister/necrotic tissue measuring around an inch over medial half of incision. No drainage Neuro: Alert/Oriented, Abnormal Sensory reduce R toes and Normal Motor Musc/Skel:  Other L BKA, Coban wrap- well shaped limb. Appropriately tender   Assessment/Plan: 1. Functional deficits secondary to L BKA due to diabetes and gangrene which require 3+ hours per day of interdisciplinary therapy in a comprehensive inpatient rehab setting. Physiatrist is providing close team supervision and 24 hour management of active medical problems listed below. Physiatrist and rehab team continue to assess barriers to discharge/monitor patient progress toward functional and medical goals. FIM: FIM - Bathing Bathing Steps Patient Completed: Chest;Right Arm;Left Arm;Abdomen (Pt declined shower and bathing/dressing LB) Bathing: 5: Set-up assist to: Obtain items  FIM - Upper Body Dressing/Undressing Upper body dressing/undressing steps patient completed: Thread/unthread right sleeve of pullover shirt/dresss;Thread/unthread left sleeve of pullover shirt/dress;Put head through opening of pull over shirt/dress;Pull shirt over trunk Upper body dressing/undressing: 5: Supervision: Safety issues/verbal cues FIM - Lower Body Dressing/Undressing Lower body dressing/undressing steps patient completed:  (Pt declined LB bathe/dressing) Lower body dressing/undressing: 0: Activity did not occur  FIM - Toileting Toileting steps completed by patient: Adjust clothing prior to toileting;Performs perineal hygiene;Adjust clothing after  toileting Toileting Assistive Devices: Grab bar or rail for support Toileting: 5: Supervision: Safety issues/verbal cues  FIM - Diplomatic Services operational officer Devices: Bedside commode;Walker;Grab bars Toilet Transfers: 4-To toilet/BSC: Min A (steadying Pt. > 75%);4-From toilet/BSC: Min A (steadying Pt. > 75%) (Pt w/ LOB that he self corrected w/ Min Guard)  FIM - Banker Devices: Walker;Arm rests Bed/Chair Transfer: 5: Bed > Chair or W/C: Supervision (verbal cues/safety issues);5: Chair or W/C > Bed: Supervision (verbal cues/safety issues)  FIM - Locomotion: Wheelchair Distance: 200 Locomotion: Wheelchair: 6: Travels 150 ft or more, turns around, maneuvers to table, bed or toilet, negotiates 3% grade: maneuvers on rugs and over door sills independently FIM - Locomotion: Ambulation Locomotion: Ambulation Assistive Devices: Designer, industrial/product Ambulation/Gait Assistance: 5: Supervision Locomotion: Ambulation: 2: Travels 50 -  149 ft with supervision/safety issues  Comprehension Comprehension Mode: Auditory Comprehension: 7-Follows complex conversation/direction: With no assist  Expression Expression Mode: Verbal Expression: 7-Expresses complex ideas: With no assist  Social Interaction Social Interaction: 6-Interacts appropriately with others with medication or extra time (anti-anxiety, antidepressant).  Problem Solving Problem Solving: 7-Solves complex problems: Recognizes & self-corrects  Memory Memory: 7-Complete Independence: No helper   Medical Problem List and Plan:  1. Left below-knee amputation secondary to peripheral vascular disease 03/22/2012  2. DVT Prophylaxis/Anticoagulation: Coumadin for DVT prophylaxis. Monitor for any bleeding episodes  3. Pain Management: Oxycodone as well as Robaxin as needed. Neurontin 400 mg 3 times a day appears effective 4. Neuropsych: This patient is capable of making decisions on his/her  own behalf.  5. History of renal transplant. Continue immunosuppressant medication  6. 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 Hemoglobin A1c of 11.2. Sugars quite low.hold all scheduled insulin  -sugars have been low to normal off DM rx 7. Hypertension. Lopressor 25 mg twice a day. Monitor with increased mobility  8. Hyperlipidemia. Lovaza, Lipitor 9. Leukocytosis: wbc with further elevation  -ua unremarkable  -wound looks pretty good honestly  -will ask ortho for follow up  -don't think cxr is needed  -may be steroid component also    LOS (Days) 6 A FACE TO FACE EVALUATION WAS PERFORMED  Arthuro Canelo T 03/30/2012, 8:25 AM

## 2012-03-31 ENCOUNTER — Inpatient Hospital Stay (HOSPITAL_COMMUNITY): Payer: Medicare Other | Admitting: Occupational Therapy

## 2012-03-31 ENCOUNTER — Inpatient Hospital Stay (HOSPITAL_COMMUNITY): Payer: Medicare Other

## 2012-03-31 ENCOUNTER — Encounter (HOSPITAL_COMMUNITY): Payer: Medicare Other | Admitting: Occupational Therapy

## 2012-03-31 DIAGNOSIS — L98499 Non-pressure chronic ulcer of skin of other sites with unspecified severity: Secondary | ICD-10-CM

## 2012-03-31 DIAGNOSIS — Z5189 Encounter for other specified aftercare: Secondary | ICD-10-CM

## 2012-03-31 DIAGNOSIS — I739 Peripheral vascular disease, unspecified: Secondary | ICD-10-CM

## 2012-03-31 DIAGNOSIS — S88119A Complete traumatic amputation at level between knee and ankle, unspecified lower leg, initial encounter: Secondary | ICD-10-CM

## 2012-03-31 LAB — CBC WITH DIFFERENTIAL/PLATELET
Basophils Absolute: 0 10*3/uL (ref 0.0–0.1)
Lymphs Abs: 3.5 10*3/uL (ref 0.7–4.0)
MCH: 26.6 pg (ref 26.0–34.0)
MCHC: 31.6 g/dL (ref 30.0–36.0)
MCV: 84.2 fL (ref 78.0–100.0)
Monocytes Absolute: 1.6 10*3/uL — ABNORMAL HIGH (ref 0.1–1.0)
Platelets: 306 10*3/uL (ref 150–400)
RDW: 13.7 % (ref 11.5–15.5)

## 2012-03-31 LAB — GLUCOSE, CAPILLARY
Glucose-Capillary: 84 mg/dL (ref 70–99)
Glucose-Capillary: 135 mg/dL — ABNORMAL HIGH (ref 70–99)

## 2012-03-31 MED ORDER — GABAPENTIN 400 MG PO CAPS: 400.0000 mg | ORAL_CAPSULE | Freq: Three times a day (TID) | ORAL | Status: DC

## 2012-03-31 MED ORDER — INSULIN ASPART 100 UNIT/ML ~~LOC~~ SOLN: 3.0000 [IU] | Freq: Three times a day (TID) | SUBCUTANEOUS | Status: DC

## 2012-03-31 MED ORDER — WARFARIN SODIUM 1 MG PO TABS
1.0000 mg | ORAL_TABLET | Freq: Every day | ORAL | Status: DC
  Filled 2012-03-31: qty 1

## 2012-03-31 MED ORDER — WARFARIN SODIUM 4 MG PO TABS
4.0000 mg | ORAL_TABLET | Freq: Once | ORAL | Status: AC
  Administered 2012-03-31: 4 mg via ORAL
  Filled 2012-03-31 (×2): qty 1

## 2012-03-31 MED ORDER — WARFARIN SODIUM 1 MG PO TABS: 1.0000 mg | ORAL_TABLET | Freq: Every day | ORAL | Status: DC

## 2012-03-31 MED ORDER — OXYCODONE HCL 10 MG PO TABS: 10.0000 mg | ORAL_TABLET | ORAL | Status: DC | PRN

## 2012-03-31 NOTE — Progress Notes (Signed)
Social Work Patient ID: Trevor Thompson, male   DOB: Aug 26, 1960, 51 y.o.   MRN: [id number] Met with pt to discuss discharge plans.  Wife to come in today to go through family education again to complete. Pt requires a lightweight wheelchair to be able to self propel, he is unable to self propel in a standard wheelchair. He also performs his self care in the wheelchair.  Pt pleased with progress and looking forward to going home tomorrow.

## 2012-03-31 NOTE — Progress Notes (Signed)
Physical Therapy Discharge Summary  Patient Details  Name: Trevor Thompson MRN: [id number] Date of Birth: December 09, 1960  Today's Date: 03/31/2012 Time: 1030-1125 (55 minutes) Individual therapy Premedicated for pain in residual limb. Treatment focused on family education with pt's wife on basic transfer techniques from various surfaces, floor transfers, curb step negotiation, safety in the home, gait with RW in controlled and home environment, car transfers with and without AD, importance of HEP, and overall energy conservation techniques. Pt able to demonstrate floor transfers with S and negotiate steps with min A using RW. Wife return demonstrated techniques. Discussed in detail recommendation to have chair at top of steps for when pt wants to go to second level so transfer would be floor to chair and then sit to stand with RW for gait. Also recommended to practice with home health and remember how much energy that will take. Pt and wife state they feel prepared for d/c tomorrow and have no further questions.   Patient has met 10 of 10 long term goals due to improved activity tolerance, improved balance, increased strength and decreased pain.  Patient to discharge at a wheelchair level Modified Independent and S gait with RW. Patient's care partner is independent to provide the necessary physical assistance at discharge and successfully completed family education.  Reasons goals not met: n/a   Recommendation:  Patient will benefit from ongoing skilled PT services in home health setting to continue to advance safe functional mobility, address ongoing impairments in balance, strength, endurance, gait training, pre-prosthetic training, and minimize fall risk.  Equipment: 18x18 w/c with basic cushion and L amp pad  Reasons for discharge: treatment goals met and discharge from hospital  Patient/family agrees with progress made and goals achieved: Yes  PT  Discharge Precautions/Restrictions Precautions Precautions: Fall Restrictions Weight Bearing Restrictions: Yes LLE Weight Bearing: Weight bearing as tolerated Vision/Perception  Vision - History Baseline Vision: Wears glasses only for reading Patient Visual Report: No change from baseline Vision - Assessment Eye Alignment: Within Functional Limits Perception Perception: Within Functional Limits Praxis Praxis: Intact  Cognition Overall Cognitive Status: Appears within functional limits for tasks assessed Arousal/Alertness: Awake/alert Orientation Level: Oriented X4 Memory: Appears intact Awareness: Appears intact Problem Solving: Appears intact Safety/Judgment: Appears intact Sensation Sensation Light Touch:  (same as admission) Light Touch Impaired Details: Impaired RUE (right hand; same as admission) Stereognosis: Appears Intact Hot/Cold: Appears Intact Coordination Gross Motor Movements are Fluid and Coordinated: Yes Fine Motor Movements are Fluid and Coordinated: Yes Motor  Motor Motor: Within Functional Limits (L BKA)  Trunk/Postural Assessment  Cervical Assessment Cervical Assessment: Within Functional Limits Thoracic Assessment Thoracic Assessment: Within Functional Limits Lumbar Assessment Lumbar Assessment: Within Functional Limits Postural Control Postural Control: Within Functional Limits  Balance Static Sitting Balance Static Sitting - Level of Assistance: 6: Modified independent (Device/Increase time) Dynamic Sitting Balance Dynamic Sitting - Level of Assistance: 6: Modified independent (Device/Increase time) Static Standing Balance Static Standing - Level of Assistance: 6: Modified independent (Device/Increase time) (with RW) Dynamic Standing Balance Dynamic Standing - Level of Assistance: 5: Stand by assistance (with RW) Extremity Assessment  RUE Assessment RUE Assessment: Within Functional Limits LUE Assessment LUE Assessment: Within  Functional Limits RLE Assessment RLE Assessment: Within Functional Limits LLE Assessment LLE Assessment: Exceptions to [facility] LLE Strength LLE Overall Strength Comments: ROM WFL; strength grossly 4/5  See FIM for current functional status  Karolee Stamps [facility] 03/31/2012, 12:08 PM

## 2012-03-31 NOTE — Discharge Summary (Signed)
  Discharge summary job [phone]

## 2012-03-31 NOTE — Progress Notes (Signed)
ANTICOAGULATION CONSULT NOTE - Follow Up Consult  Pharmacy Consult:  Coumadin Indication: VTE prophylaxis  Allergies  Allergen Reactions  . Penicillins Other (See Comments)    Since birth    Patient Measurements: Height: 5\' 11"  (180.3 cm) Weight: 231 lb 11.3 oz (105.1 kg) (standing scale) IBW/kg (Calculated) : 75.3    Vital Signs: Temp: 98.7 F (37.1 C) (09/27 0552) Temp src: Oral (09/27 0552) BP: 144/86 mmHg (09/27 0552) Pulse Rate: 93  (09/27 0552)  Labs:  Basename 03/31/12 0512 03/30/12 0540 03/29/12 1102 03/29/12 0635  HGB 11.3* -- 11.6* --  HCT 35.8* -- 35.7* --  PLT 306 -- 264 --  APTT -- -- -- --  LABPROT 25.0* 21.9* -- 19.8*  INR 2.39* 2.00* -- 1.75*  HEPARINUNFRC -- -- -- --  CREATININE -- -- -- --  CKTOTAL -- -- -- --  CKMB -- -- -- --  TROPONINI -- -- -- --    Estimated Creatinine Clearance: 115.8 ml/min (by C-G formula based on Cr of 0.92).   Assessment: 51 y/o male patient admitted with gangrenous left foot s/p left BKA (due to PVD) requiring Coumadin for VTE prophylaxis.  Anticoagulation: Warfarin for VTE prophylaxis s/p BKA 03/22/12. INR 2.39, no bleeding. CBC stable.  ID: Afebrile, WBC elevated at 22.5 down to 17.5 - not on abx. Afebrile. Urine cx negative. Probably from steroids.  Cardiovascular: HTN, PVD, Inc TG. BP 144/86,93 on Lipitor, Lovaza, Lopressor  Endocrinology: DM1, CBGs have previously been hypoglycemic, only on SSI now, Lantus d/c'ed. CBGs 117-167 last 24h. Hgb A1c 11.2.  Gastrointestinal / Nutrition: GERD on Protonix. Carb modified diet  Neurology: Peripheral and diabetic neuropathy on gabapentin  Nephrology: renal transplant 2004 on mycophenolate, tacrolimus, prednisone. SCr down 0.92, lytes WNL, decent UOP  Hematology / Oncology: hgb ok 11.6, plts WNL  Best Practices: warfarin   Goal of Therapy:  INR 2-3 Monitor platelets by anticoagulation protocol: Yes   Plan:  - Coumadin 4mg  po x 1 today - Daily PT /  INR     Lulubelle Simcoe S. Merilynn Finland, PharmD, [facility] Clinical Staff Pharmacist Pager [phone]  03/31/2012, 9:53 AM

## 2012-03-31 NOTE — Progress Notes (Signed)
Occupational Therapy Session Notes & Discharge Summary  Patient Details  Name: Trevor Thompson MRN: [id number] Date of Birth: September 24, 1960  Today's Date: 03/31/2012  SESSION NOTES  SESSION #1 2956-2130 - 55 Minutes Individual Therapy No complaints of pain Patient found prone in bed playing on tablet. Engaged in bed mobility for edge of bed -> w/c stand pivot transfer. Patient then propelled self -> tub room for ADL at shower level using tub/shower unit on tub transfer bench. Patient's wife present during entire ADL for education. Patient and wife completed ADL independently without assistance from therapist. After ADL patient propelled self-> gym for UE exercises using weighted bar and SCIFIT machine.   SESSION #2 1430-1510 - 40 Minutes Individual Therapy No complaints of pain Skilled intervention focusing on compression wrapping; education, demonstration, and return demonstration from patient and patient's wife. Also engaged in bed mobility and education regarding breaking down w/c for transportation.   DISCHARGE SUMMARY Patient has met 9 of 9 long term goals due to improved activity tolerance, improved balance, postural control, ability to compensate for deficits, improved attention, improved awareness and improved coordination.  Patient to discharge at overall mod I-supervision level.  Patient's care partner is independent to provide the necessary supervision prn assistance at discharge.    Reasons goals not met: n/a; all goals met at this time.  Recommendation:  Patient will benefit from ongoing skilled OT services in home health setting to continue to advance functional skills in the area of BADL, iADL, Reduce care partner burden and overall strengthening & endurance.  Equipment: tub transfer bench and BSC  Reasons for discharge: treatment goals met and discharge from hospital  Patient/family agrees with progress made and goals achieved: Yes  Precautions/Restrictions   Precautions Precautions: Fall Restrictions Weight Bearing Restrictions: Yes LLE Weight Bearing: Weight bearing as tolerated  Pain Pain Assessment Pain Assessment: No/denies pain Pain Score: 0-No pain Faces Pain Scale: No hurt Pain Type: Surgical pain Pain Location: Leg Pain Orientation: Left Pain Descriptors: Aching Pain Intervention(s): Medication (See eMAR)  ADL - See FIM  Vision/Perception  Vision - History Baseline Vision: Wears glasses only for reading Patient Visual Report: No change from baseline Vision - Assessment Eye Alignment: Within Functional Limits Perception Perception: Within Functional Limits Praxis Praxis: Intact   Cognition Overall Cognitive Status: Appears within functional limits for tasks assessed Arousal/Alertness: Awake/alert Orientation Level: Oriented X4 Memory: Appears intact Awareness: Appears intact Problem Solving: Appears intact Safety/Judgment: Appears intact  Sensation Sensation Light Touch: Impaired by gross assessment Light Touch Impaired Details: Impaired RUE (right hand; same as admission) Stereognosis: Appears Intact Hot/Cold: Appears Intact Coordination Gross Motor Movements are Fluid and Coordinated: Yes Fine Motor Movements are Fluid and Coordinated: Yes  Motor - See Discharge Navigator  Mobility - See Discharge Navigator  Trunk/Postural Assessment - See Discharge Navigator  Balance- See Discharge Navigator  Extremity/Trunk Assessment RUE Assessment RUE Assessment: Within Functional Limits LUE Assessment LUE Assessment: Within Functional Limits  See FIM for current functional status  Filiberto Wamble 03/31/2012, 11:52 AM

## 2012-03-31 NOTE — Progress Notes (Signed)
Patient ID: Trevor Thompson, male   DOB: December 23, 1960, 51 y.o.   MRN: [id number] Trevor Thompson is a 51 y.o. right-handed male with history of diabetes mellitus with peripheral neuropathy, renal transplant 2004, recent left second toe amputation secondary to peripheral vascular disease 02/25/2012. Patient was independent prior to admission. Admitted 03/22/2012 with nonhealing left foot and no change with conservative care. Limb was not felt to be salvageable. 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 Lajoyce Corners. Placed on Coumadin for DVT prophylaxis  Subjective/Complaints:   no complaints. Excited to go home  Review of Systems  Musculoskeletal: Positive for myalgias.  Neurological: Positive for dizziness. Negative for sensory change.  All other systems reviewed and are negative.    Objective: Vital Signs: Blood pressure 144/86, pulse 93, temperature 98.7 F (37.1 C), temperature source Oral, resp. rate 19, height 5\' 11"  (1.803 m), weight 105.1 kg (231 lb 11.3 oz), SpO2 96.00%. No results found. Results for orders placed during the hospital encounter of 03/24/12 (from the past 72 hour(s))  GLUCOSE, CAPILLARY     Status: Normal   Collection Time   03/28/12 11:42 AM      Component Value Range Comment   Glucose-Capillary 83  70 - 99 mg/dL    Comment 1 Notify RN     GLUCOSE, CAPILLARY     Status: Normal   Collection Time   03/28/12  4:26 PM      Component Value Range Comment   Glucose-Capillary 97  70 - 99 mg/dL   GLUCOSE, CAPILLARY     Status: Abnormal   Collection Time   03/28/12  9:32 PM      Component Value Range Comment   Glucose-Capillary 132 (*) 70 - 99 mg/dL   PROTIME-INR     Status: Abnormal   Collection Time   03/29/12  6:35 AM      Component Value Range Comment   Prothrombin Time 19.8 (*) 11.6 - 15.2 seconds    INR 1.75 (*) 0.00 - 1.49   GLUCOSE, CAPILLARY     Status: Abnormal   Collection Time   03/29/12  7:14 AM      Component Value Range Comment   Glucose-Capillary 177  (*) 70 - 99 mg/dL    Comment 1 Notify RN     CBC WITH DIFFERENTIAL     Status: Abnormal   Collection Time   03/29/12 11:02 AM      Component Value Range Comment   WBC 25.6 (*) 4.0 - 10.5 K/uL    RBC 4.21 (*) 4.22 - 5.81 MIL/uL    Hemoglobin 11.6 (*) 13.0 - 17.0 g/dL    HCT 40.9 (*) 81.1 - 52.0 %    MCV 84.8  78.0 - 100.0 fL    MCH 27.6  26.0 - 34.0 pg    MCHC 32.5  30.0 - 36.0 g/dL    RDW 91.4  78.2 - 95.6 %    Platelets 264  150 - 400 K/uL    Neutrophils Relative 77  43 - 77 %    Lymphocytes Relative 10 (*) 12 - 46 %    Monocytes Relative 11  3 - 12 %    Eosinophils Relative 2  0 - 5 %    Basophils Relative 0  0 - 1 %    Neutro Abs 19.7 (*) 1.7 - 7.7 K/uL    Lymphs Abs 2.6  0.7 - 4.0 K/uL    Monocytes Absolute 2.8 (*) 0.1 - 1.0 K/uL    Eosinophils  Absolute 0.5  0.0 - 0.7 K/uL    Basophils Absolute 0.0  0.0 - 0.1 K/uL    RBC Morphology POLYCHROMASIA PRESENT     GLUCOSE, CAPILLARY     Status: Abnormal   Collection Time   03/29/12 11:22 AM      Component Value Range Comment   Glucose-Capillary 67 (*) 70 - 99 mg/dL    Comment 1 Notify RN     GLUCOSE, CAPILLARY     Status: Abnormal   Collection Time   03/29/12 11:47 AM      Component Value Range Comment   Glucose-Capillary 69 (*) 70 - 99 mg/dL    Comment 1 Notify RN     GLUCOSE, CAPILLARY     Status: Normal   Collection Time   03/29/12 11:56 AM      Component Value Range Comment   Glucose-Capillary 73  70 - 99 mg/dL    Comment 1 Notify RN     URINALYSIS, ROUTINE W REFLEX MICROSCOPIC     Status: Abnormal   Collection Time   03/29/12  4:23 PM      Component Value Range Comment   Color, Urine YELLOW  YELLOW    APPearance CLEAR  CLEAR    Specific Gravity, Urine 1.005  1.005 - 1.030    pH 5.5  5.0 - 8.0    Glucose, UA NEGATIVE  NEGATIVE mg/dL    Hgb urine dipstick TRACE (*) NEGATIVE    Bilirubin Urine NEGATIVE  NEGATIVE    Ketones, ur NEGATIVE  NEGATIVE mg/dL    Protein, ur NEGATIVE  NEGATIVE mg/dL    Urobilinogen, UA 0.2   0.0 - 1.0 mg/dL    Nitrite NEGATIVE  NEGATIVE    Leukocytes, UA NEGATIVE  NEGATIVE   URINE CULTURE     Status: Normal   Collection Time   03/29/12  4:23 PM      Component Value Range Comment   Specimen Description URINE, CLEAN CATCH      Special Requests NONE      Culture  Setup Time 03/30/2012 00:44      Colony Count NO GROWTH      Culture NO GROWTH      Report Status 03/30/2012 FINAL     URINE MICROSCOPIC-ADD ON     Status: Normal   Collection Time   03/29/12  4:23 PM      Component Value Range Comment   Squamous Epithelial / LPF RARE  RARE   GLUCOSE, CAPILLARY     Status: Abnormal   Collection Time   03/29/12  4:52 PM      Component Value Range Comment   Glucose-Capillary 164 (*) 70 - 99 mg/dL   GLUCOSE, CAPILLARY     Status: Abnormal   Collection Time   03/29/12  8:53 PM      Component Value Range Comment   Glucose-Capillary 154 (*) 70 - 99 mg/dL    Comment 1 Notify RN     PROTIME-INR     Status: Abnormal   Collection Time   03/30/12  5:40 AM      Component Value Range Comment   Prothrombin Time 21.9 (*) 11.6 - 15.2 seconds    INR 2.00 (*) 0.00 - 1.49   GLUCOSE, CAPILLARY     Status: Abnormal   Collection Time   03/30/12  7:18 AM      Component Value Range Comment   Glucose-Capillary 139 (*) 70 - 99 mg/dL    Comment 1 Notify  RN     GLUCOSE, CAPILLARY     Status: Abnormal   Collection Time   03/30/12 11:33 AM      Component Value Range Comment   Glucose-Capillary 117 (*) 70 - 99 mg/dL    Comment 1 Notify RN     GLUCOSE, CAPILLARY     Status: Abnormal   Collection Time   03/30/12  4:43 PM      Component Value Range Comment   Glucose-Capillary 157 (*) 70 - 99 mg/dL   GLUCOSE, CAPILLARY     Status: Abnormal   Collection Time   03/30/12  9:22 PM      Component Value Range Comment   Glucose-Capillary 152 (*) 70 - 99 mg/dL   PROTIME-INR     Status: Abnormal   Collection Time   03/31/12  5:12 AM      Component Value Range Comment   Prothrombin Time 25.0 (*) 11.6 - 15.2  seconds    INR 2.39 (*) 0.00 - 1.49   CBC WITH DIFFERENTIAL     Status: Abnormal   Collection Time   03/31/12  5:12 AM      Component Value Range Comment   WBC 17.5 (*) 4.0 - 10.5 K/uL    RBC 4.25  4.22 - 5.81 MIL/uL    Hemoglobin 11.3 (*) 13.0 - 17.0 g/dL    HCT 16.1 (*) 09.6 - 52.0 %    MCV 84.2  78.0 - 100.0 fL    MCH 26.6  26.0 - 34.0 pg    MCHC 31.6  30.0 - 36.0 g/dL    RDW 04.5  40.9 - 81.1 %    Platelets 306  150 - 400 K/uL    Neutrophils Relative 69  43 - 77 %    Lymphocytes Relative 20  12 - 46 %    Monocytes Relative 9  3 - 12 %    Eosinophils Relative 2  0 - 5 %    Basophils Relative 0  0 - 1 %    Neutro Abs 12.0 (*) 1.7 - 7.7 K/uL    Lymphs Abs 3.5  0.7 - 4.0 K/uL    Monocytes Absolute 1.6 (*) 0.1 - 1.0 K/uL    Eosinophils Absolute 0.4  0.0 - 0.7 K/uL    Basophils Absolute 0.0  0.0 - 0.1 K/uL   GLUCOSE, CAPILLARY     Status: Abnormal   Collection Time   03/31/12  7:32 AM      Component Value Range Comment   Glucose-Capillary 167 (*) 70 - 99 mg/dL      HEENT: normal Cardio: RRR Resp: CTA B/L GI: BS positive Extremity:  No Edema Skin:   Wound well approximate, small area of blister/necrotic tissue measuring around an inch over medial half of incision. No drainage Neuro: Alert/Oriented, Abnormal Sensory reduce R toes and Normal Motor Musc/Skel:  Other L BKA, Coban wrap- well shaped limb. Appropriately tender   Assessment/Plan: 1. Functional deficits secondary to L BKA due to diabetes and gangrene which require 3+ hours per day of interdisciplinary therapy in a comprehensive inpatient rehab setting. Physiatrist is providing close team supervision and 24 hour management of active medical problems listed below. Physiatrist and rehab team continue to assess barriers to discharge/monitor patient progress toward functional and medical goals. FIM: FIM - Bathing Bathing Steps Patient Completed: Chest;Right Arm;Left Arm;Abdomen (Pt declined shower and bathing/dressing  LB) Bathing: 0: Activity did not occur  FIM - Upper Body Dressing/Undressing Upper body dressing/undressing steps  patient completed: Thread/unthread right sleeve of pullover shirt/dresss;Thread/unthread left sleeve of pullover shirt/dress;Put head through opening of pull over shirt/dress;Pull shirt over trunk Upper body dressing/undressing: 0: Activity did not occur FIM - Lower Body Dressing/Undressing Lower body dressing/undressing steps patient completed: Thread/unthread right pants leg;Thread/unthread left pants leg;Pull pants up/down;Don/Doff left sock;Don/Doff left shoe;Fasten/unfasten left shoe Lower body dressing/undressing: 5: Supervision: Safety issues/verbal cues  FIM - Toileting Toileting steps completed by patient: Adjust clothing prior to toileting;Performs perineal hygiene;Adjust clothing after toileting Toileting Assistive Devices: Grab bar or rail for support Toileting: 0: Activity did not occur  FIM - Diplomatic Services operational officer Devices: Bedside commode;Walker;Grab bars Toilet Transfers: 0-Activity did not occur  FIM - Banker Devices: Environmental consultant;Arm rests Bed/Chair Transfer: 5: Bed > Chair or W/C: Supervision (verbal cues/safety issues);5: Chair or W/C > Bed: Supervision (verbal cues/safety issues)  FIM - Locomotion: Wheelchair Distance: 200 Locomotion: Wheelchair: 5: Travels 150 ft or more: maneuvers on rugs and over door sills with supervision, cueing or coaxing FIM - Locomotion: Ambulation Locomotion: Ambulation Assistive Devices: Designer, industrial/product Ambulation/Gait Assistance: 5: Supervision Locomotion: Ambulation: 2: Travels 50 - 149 ft with supervision/safety issues  Comprehension Comprehension Mode: Auditory Comprehension: 7-Follows complex conversation/direction: With no assist  Expression Expression Mode: Verbal Expression: 7-Expresses complex ideas: With no assist  Social Interaction Social  Interaction: 7-Interacts appropriately with others - No medications needed.  Problem Solving Problem Solving: 7-Solves complex problems: Recognizes & self-corrects  Memory Memory: 6-More than reasonable amt of time   Medical Problem List and Plan:  1. Left below-knee amputation secondary to peripheral vascular disease 03/22/2012  2. DVT Prophylaxis/Anticoagulation: Coumadin for DVT prophylaxis. Monitor for any bleeding episodes  3. Pain Management: Oxycodone as well as Robaxin as needed. Neurontin 400 mg 3 times a day appears effective 4. Neuropsych: This patient is capable of making decisions on his/her own behalf.  5. History of renal transplant. Continue immunosuppressant medication  6. 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 Hemoglobin A1c of 11.2. Sugars quite low.hold all scheduled insulin  -sugars have been low to normal off DM rx--follow up with pcp 7. Hypertension. Lopressor 25 mg twice a day. Monitor with increased mobility  8. Hyperlipidemia. Lovaza, Lipitor 9. Leukocytosis: wbc down to 17--likely steroids  -ua unremarkable  -wound looks good  -ortho follow up next week for suture removal  -i'll see back in about one month  -ACE wrap for now. shrinker in about 10 days   LOS (Days) 7 A FACE TO FACE EVALUATION WAS PERFORMED  Trevor Thompson T 03/31/2012, 8:29 AM

## 2012-03-31 NOTE — Discharge Summary (Signed)
Trevor Thompson, Trevor Thompson NO.:  [id number]  MEDICAL RECORD NO.:  [id number]  LOCATION:  4149                         FACILITY:  [facility]  PHYSICIAN:  Ranelle Oyster, M.D.DATE OF BIRTH:  [birth year]  DATE OF ADMISSION:  03/24/2012 DATE OF DISCHARGE:  04/01/2012                              DISCHARGE SUMMARY   DISCHARGE DIAGNOSES: 1. Left below-knee amputation secondary to peripheral vascular disease     on March 22, 2012. 2. Coumadin for deep vein thrombosis prophylaxis, pain management. 3. History of renal transplant. 4. Diabetes mellitus. 5. Hypertension. 6. Leukocytosis.  HISTORY:  This is a 51 year old right-handed male with history of diabetes mellitus, peripheral neuropathy, and renal transplant in 2004, with recent left second toe amputation on February 25, 2012, secondary to peripheral vascular disease.  The patient was admitted on March 22, 2012, with nonhealing left foot and no change with conservative care. Limb was not felt to be salvageable.  Underwent left below-knee amputation on March 22, 2012, per Dr. Lajoyce Corners.  Placed on Coumadin for DVT prophylaxis.  Postoperative pain control.  Physical and Occupational Therapy ongoing with recommendations of Physical Medicine Rehab consultation and the patient was admitted for comprehensive rehab program.  PAST MEDICAL HISTORY:  See discharge diagnoses.  SOCIAL HISTORY:  Lives with his spouse, Two-level home, two steps to entry of home.  FUNCTIONAL HISTORY PRIOR TO ADMISSION:  Independent.  Not driving.  He is on disability.  FUNCTIONAL STATUS UPON ADMISSION TO REHAB SERVICES:  Minimal-assist to ambulate 20 feet with a rolling walker.  PHYSICAL EXAMINATION:  VITAL SIGNS:  Blood pressure 105/55, temperature 98, pulse 90, respirations 18. GENERAL:  This was an alert male, in no acute distress, oriented x3. HEENT:  Pupils were reactive to light. LUNGS:  Clear to auscultation. CARDIAC:  Regular  rate and rhythm. ABDOMEN:  Soft, nontender.  Good bowel sounds. EXTREMITIES:  Amputation site clean and dressed.  REHABILITATION HOSPITAL COURSE:  The patient was admitted to Inpatient Rehab Services with therapies initiated on a 3-hour daily basis consisting of physical therapy, occupational therapy, and rehabilitation nursing.  The following issues were addressed during the patient's rehabilitation stay.  Pertaining to Mr. Pulcini's left below-knee amputation on March 22, 2012, per Dr. Lajoyce Corners, surgical site healing nicely with follow up per Dr. Robyne Peers.  Arrangements will be made to follow up in the Amputee Clinic.  The patient remained on Coumadin for DVT prophylaxis.  Latest INR of 1.75 with no bleeding episodes.  Plan was to continue 1 mg of Coumadin daily until April 21, 2012, and stop. During the patient's hospital course, noted some elevated white blood cell count, felt to be driven by prednisone that he remained on chronically for renal transplant.  His wound looked good.  His lungs were clear to auscultation.  Urinalysis was unremarkable.  Oxygen saturations greater than 90% on room air.  No chest pain or shortness of breath.  His white count did improve from 25,000 to 17,000 at time of discharge.  His blood sugars were well controlled.  Hemoglobin A1c of 11.2.  Scheduled insulin as advised.  Pain management with the use of Neurontin 400 mg 3 times daily as well  as Robaxin and oxycodone for breakthrough pain.  His blood pressures were well controlled on Lopressor 25 mg b.i.d.  He did have a history of renal transplant.  He continued on immunosuppressive medication.  The patient received weekly collaborative interdisciplinary team conferences to discuss estimated length of stay, family teaching, and any barriers to discharge.  He was continent of bowel and bladder, required supervision to steady assist for activities of daily living, minimum-to-moderate-assist overall  for transfers and ambulation, supervision for wheelchair mobility.  Full family teaching was completed with his wife and plans to be discharged to home on April 01, 2012.  DISCHARGE MEDICATIONS: 1. Lipitor 10 mg daily. 2. Neurontin 400 mg 3 times daily. 3. Robaxin 500 mg every 6 hours as needed for spasms. 4. Metoprolol 25 mg b.i.d. 5. Myfortic 360 mg b.i.d. 6. Lovaza 2 g twice daily. 7. Oxycodone 10 mg every 4 hours as needed for pain. 8. Protonix 40 mg daily. 9. Prednisone 5 mg daily. 10.Prograf 4 mg b.i.d. 11.Coumadin 1 mg daily until 04/21/2012 and stopped  PLAN:  Plan would be to continue 1 mg Coumadin for DVT prophylaxis to be completed on April 21, 2012.  Home health therapies would be ongoing as per [employer].  The patient was to follow up with Dr. Lajoyce Corners in 1 week after discharge, Dr. Faith Rogue, the Outpatient Rehab Service office as advised.     Mariam Dollar, P.A.   ______________________________ Ranelle Oyster, M.D.    DA/MEDQ  D:  03/31/2012  T:  03/31/2012  Job:  [id number]  cc:   Nadara Mustard, MD

## 2012-03-31 NOTE — Progress Notes (Signed)
Physical Therapy Session Note  Patient Details  Name: Trevor Thompson MRN: [id number] Date of Birth: October 29, 1960  Today's Date: 03/31/2012 Time: 5621-3086 Time Calculation (min): 30 min  Short Term Goals: Week 1:  PT Short Term Goal 1 (Week 1): = long term goals  Session focused on education with pt and wife (at end of session) on dealing with amputation, available resources for amputee support group ([facility] Support group info given), First [employer] magazine issued and generally reviewed and discussion on process of how a prosthesis works in general. Pt expressed that this was helpful as he had a lot of questions and would be interested in contacting a support group after d/c. Also discussed DME for d/c and that w/c should be delivered later this afternoon most likely and adjusted for him.     Therapy Documentation Precautions:  Precautions Precautions: Fall Restrictions Weight Bearing Restrictions: Yes LLE Weight Bearing: Non weight bearing   Pain: No complaints  See FIM for current functional status  Therapy/Group: Individual Therapy  Karolee Stamps [facility] 03/31/2012, 9:13 AM

## 2012-03-31 NOTE — Progress Notes (Signed)
Social Work Discharge Note Discharge Note  The overall goal for the admission was met for:   Discharge location: Yes-HOME WITH WIFE PROVIDING SUPERVISION LEVEL  Length of Stay: Yes-8 DAYS  Discharge activity level: Yes-SUPERVISION/MOD/I LEVEL  Home/community participation: Yes  Services provided included: MD, RD, PT, OT, RN, TR, Pharmacy and SW  Financial Services: Medicare  Follow-up services arranged: Home Health: [facility]-PT, RN, DME: [facility]-WHEELCHAIR, [facility], [facility] and Patient/Family has no preference for HH/DME agencies  Comments (or additional information):FAMILY EDUCATION COMPLETED BOTH COMFORTABLE WITH DISCHARGE  Patient/Family verbalized understanding of follow-up arrangements: Yes  Individual responsible for coordination of the follow-up plan: PATIENT & DORTHY-WIFE  Confirmed correct DME delivered: [name] 03/31/2012    [name]

## 2012-04-01 LAB — PROTIME-INR: Prothrombin Time: 30.6 seconds — ABNORMAL HIGH (ref 11.6–15.2)

## 2012-04-01 NOTE — Progress Notes (Signed)
No complaints   Trevor Thompson is a 51 y.o. right-handed male with history of diabetes mellitus with peripheral neuropathy, renal transplant 2004, recent left second toe amputation secondary to peripheral vascular disease 02/25/2012. Patient was independent prior to admission. Admitted 03/22/2012 with nonhealing left foot and no change with conservative care. Limb was not felt to be salvageable. 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 Lajoyce Corners. Placed on Coumadin for DVT prophylaxis  Subjective/Complaints:   no complaints. Excited to go home  Review of Systems  Musculoskeletal: Positive for myalgias.  Neurological: Positive for dizziness. Negative for sensory change.  All other systems reviewed and are negative.    Objective: Vital Signs: Blood pressure 154/92, pulse 85, temperature 97.8 F (36.6 C), temperature source Oral, resp. rate 18, height 5\' 11"  (1.803 m), weight 231 lb 11.3 oz (105.1 kg), SpO2 98.00%. No results found. Results for orders placed during the hospital encounter of 03/24/12 (from the past 72 hour(s))  CBC WITH DIFFERENTIAL     Status: Abnormal   Collection Time   03/29/12 11:02 AM      Component Value Range Comment   WBC 25.6 (*) 4.0 - 10.5 K/uL    RBC 4.21 (*) 4.22 - 5.81 MIL/uL    Hemoglobin 11.6 (*) 13.0 - 17.0 g/dL    HCT 13.0 (*) 86.5 - 52.0 %    MCV 84.8  78.0 - 100.0 fL    MCH 27.6  26.0 - 34.0 pg    MCHC 32.5  30.0 - 36.0 g/dL    RDW 78.4  69.6 - 29.5 %    Platelets 264  150 - 400 K/uL    Neutrophils Relative 77  43 - 77 %    Lymphocytes Relative 10 (*) 12 - 46 %    Monocytes Relative 11  3 - 12 %    Eosinophils Relative 2  0 - 5 %    Basophils Relative 0  0 - 1 %    Neutro Abs 19.7 (*) 1.7 - 7.7 K/uL    Lymphs Abs 2.6  0.7 - 4.0 K/uL    Monocytes Absolute 2.8 (*) 0.1 - 1.0 K/uL    Eosinophils Absolute 0.5  0.0 - 0.7 K/uL    Basophils Absolute 0.0  0.0 - 0.1 K/uL    RBC Morphology POLYCHROMASIA PRESENT     GLUCOSE, CAPILLARY      Status: Abnormal   Collection Time   03/29/12 11:22 AM      Component Value Range Comment   Glucose-Capillary 67 (*) 70 - 99 mg/dL    Comment 1 Notify RN     GLUCOSE, CAPILLARY     Status: Abnormal   Collection Time   03/29/12 11:47 AM      Component Value Range Comment   Glucose-Capillary 69 (*) 70 - 99 mg/dL    Comment 1 Notify RN     GLUCOSE, CAPILLARY     Status: Normal   Collection Time   03/29/12 11:56 AM      Component Value Range Comment   Glucose-Capillary 73  70 - 99 mg/dL    Comment 1 Notify RN     URINALYSIS, ROUTINE W REFLEX MICROSCOPIC     Status: Abnormal   Collection Time   03/29/12  4:23 PM      Component Value Range Comment   Color, Urine YELLOW  YELLOW    APPearance CLEAR  CLEAR    Specific Gravity, Urine 1.005  1.005 - 1.030    pH 5.5  5.0 - 8.0    Glucose, UA NEGATIVE  NEGATIVE mg/dL    Hgb urine dipstick TRACE (*) NEGATIVE    Bilirubin Urine NEGATIVE  NEGATIVE    Ketones, ur NEGATIVE  NEGATIVE mg/dL    Protein, ur NEGATIVE  NEGATIVE mg/dL    Urobilinogen, UA 0.2  0.0 - 1.0 mg/dL    Nitrite NEGATIVE  NEGATIVE    Leukocytes, UA NEGATIVE  NEGATIVE   URINE CULTURE     Status: Normal   Collection Time   03/29/12  4:23 PM      Component Value Range Comment   Specimen Description URINE, CLEAN CATCH      Special Requests NONE      Culture  Setup Time 03/30/2012 00:44      Colony Count NO GROWTH      Culture NO GROWTH      Report Status 03/30/2012 FINAL     URINE MICROSCOPIC-ADD ON     Status: Normal   Collection Time   03/29/12  4:23 PM      Component Value Range Comment   Squamous Epithelial / LPF RARE  RARE   GLUCOSE, CAPILLARY     Status: Abnormal   Collection Time   03/29/12  4:52 PM      Component Value Range Comment   Glucose-Capillary 164 (*) 70 - 99 mg/dL   GLUCOSE, CAPILLARY     Status: Abnormal   Collection Time   03/29/12  8:53 PM      Component Value Range Comment   Glucose-Capillary 154 (*) 70 - 99 mg/dL    Comment 1 Notify RN       PROTIME-INR     Status: Abnormal   Collection Time   03/30/12  5:40 AM      Component Value Range Comment   Prothrombin Time 21.9 (*) 11.6 - 15.2 seconds    INR 2.00 (*) 0.00 - 1.49   GLUCOSE, CAPILLARY     Status: Abnormal   Collection Time   03/30/12  7:18 AM      Component Value Range Comment   Glucose-Capillary 139 (*) 70 - 99 mg/dL    Comment 1 Notify RN     GLUCOSE, CAPILLARY     Status: Abnormal   Collection Time   03/30/12 11:33 AM      Component Value Range Comment   Glucose-Capillary 117 (*) 70 - 99 mg/dL    Comment 1 Notify RN     GLUCOSE, CAPILLARY     Status: Abnormal   Collection Time   03/30/12  4:43 PM      Component Value Range Comment   Glucose-Capillary 157 (*) 70 - 99 mg/dL   GLUCOSE, CAPILLARY     Status: Abnormal   Collection Time   03/30/12  9:22 PM      Component Value Range Comment   Glucose-Capillary 152 (*) 70 - 99 mg/dL   PROTIME-INR     Status: Abnormal   Collection Time   03/31/12  5:12 AM      Component Value Range Comment   Prothrombin Time 25.0 (*) 11.6 - 15.2 seconds    INR 2.39 (*) 0.00 - 1.49   CBC WITH DIFFERENTIAL     Status: Abnormal   Collection Time   03/31/12  5:12 AM      Component Value Range Comment   WBC 17.5 (*) 4.0 - 10.5 K/uL    RBC 4.25  4.22 - 5.81 MIL/uL    Hemoglobin 11.3 (*)  13.0 - 17.0 g/dL    HCT 47.8 (*) 29.5 - 52.0 %    MCV 84.2  78.0 - 100.0 fL    MCH 26.6  26.0 - 34.0 pg    MCHC 31.6  30.0 - 36.0 g/dL    RDW 62.1  30.8 - 65.7 %    Platelets 306  150 - 400 K/uL    Neutrophils Relative 69  43 - 77 %    Lymphocytes Relative 20  12 - 46 %    Monocytes Relative 9  3 - 12 %    Eosinophils Relative 2  0 - 5 %    Basophils Relative 0  0 - 1 %    Neutro Abs 12.0 (*) 1.7 - 7.7 K/uL    Lymphs Abs 3.5  0.7 - 4.0 K/uL    Monocytes Absolute 1.6 (*) 0.1 - 1.0 K/uL    Eosinophils Absolute 0.4  0.0 - 0.7 K/uL    Basophils Absolute 0.0  0.0 - 0.1 K/uL   GLUCOSE, CAPILLARY     Status: Abnormal   Collection Time   03/31/12   7:32 AM      Component Value Range Comment   Glucose-Capillary 167 (*) 70 - 99 mg/dL   GLUCOSE, CAPILLARY     Status: Normal   Collection Time   03/31/12 11:51 AM      Component Value Range Comment   Glucose-Capillary 84  70 - 99 mg/dL   GLUCOSE, CAPILLARY     Status: Abnormal   Collection Time   03/31/12  4:59 PM      Component Value Range Comment   Glucose-Capillary 174 (*) 70 - 99 mg/dL   GLUCOSE, CAPILLARY     Status: Abnormal   Collection Time   03/31/12  8:36 PM      Component Value Range Comment   Glucose-Capillary 135 (*) 70 - 99 mg/dL   PROTIME-INR     Status: Abnormal   Collection Time   04/01/12  7:30 AM      Component Value Range Comment   Prothrombin Time 30.6 (*) 11.6 - 15.2 seconds    INR 3.14 (*) 0.00 - 1.49   GLUCOSE, CAPILLARY     Status: Abnormal   Collection Time   04/01/12  7:41 AM      Component Value Range Comment   Glucose-Capillary 243 (*) 70 - 99 mg/dL    Comment 1 Notify RN        HEENT: normal Cardio: RRR Resp: CTA B/L GI: BS positive Extremity:  No Edema Skin:   Wound well approximate, small area of blister/necrotic tissue measuring around an inch over medial half of incision. No drainage Neuro: Alert/Oriented, Abnormal Sensory reduce R toes and Normal Motor Musc/Skel:  Other L BKA, Coban wrap- well shaped limb. Appropriately tender   Assessment/Plan: 1. Functional deficits secondary to L BKA due to diabetes and gangrene which require 3+ hours per day of interdisciplinary therapy in a comprehensive inpatient rehab setting. Physiatrist is providing close team supervision and 24 hour management of active medical problems listed below. Physiatrist and rehab team continue to assess barriers to discharge/monitor patient progress toward functional and medical goals. FIM: FIM - Bathing Bathing Steps Patient Completed: Chest;Right Arm;Left Arm;Abdomen;Front perineal area;Buttocks;Right upper leg;Left upper leg;Right lower leg (including foot) Bathing:  5: Supervision: Safety issues/verbal cues  FIM - Upper Body Dressing/Undressing Upper body dressing/undressing steps patient completed: Thread/unthread right sleeve of pullover shirt/dresss;Thread/unthread left sleeve of pullover shirt/dress;Put head through opening  of pull over shirt/dress;Pull shirt over trunk Upper body dressing/undressing: 7: Complete Independence: No helper FIM - Lower Body Dressing/Undressing Lower body dressing/undressing steps patient completed: Thread/unthread right underwear leg;Thread/unthread left underwear leg;Pull underwear up/down;Thread/unthread left pants leg;Thread/unthread right pants leg;Pull pants up/down;Don/Doff right sock Lower body dressing/undressing: 6: Assistive device (Comment)  FIM - Toileting Toileting steps completed by patient: Adjust clothing prior to toileting;Performs perineal hygiene;Adjust clothing after toileting Toileting Assistive Devices: Grab bar or rail for support Toileting: 6: Assistive device: No helper  FIM - Diplomatic Services operational officer Devices: Bedside commode;Walker;Grab bars Toilet Transfers: 6-Assistive device: No helper  FIM - Banker Devices: Arm rests Bed/Chair Transfer: 6: Assistive device: no helper;7: Supine > Sit: No assist;7: Sit > Supine: No assist;6: Bed > Chair or W/C: No assist;6: Chair or W/C > Bed: No assist  FIM - Locomotion: Wheelchair Distance: 200 Locomotion: Wheelchair: 6: Travels 150 ft or more, turns around, maneuvers to table, bed or toilet, negotiates 3% grade: maneuvers on rugs and over door sills independently FIM - Locomotion: Ambulation Locomotion: Ambulation Assistive Devices: Designer, industrial/product Ambulation/Gait Assistance: 5: Supervision Locomotion: Ambulation: 2: Travels 50 - 149 ft with supervision/safety issues  Comprehension Comprehension Mode: Auditory Comprehension: 7-Follows complex conversation/direction: With no  assist  Expression Expression Mode: Verbal Expression: 7-Expresses complex ideas: With no assist  Social Interaction Social Interaction: 7-Interacts appropriately with others - No medications needed.  Problem Solving Problem Solving: 7-Solves complex problems: Recognizes & self-corrects  Memory Memory: 7-Complete Independence: No helper   Medical Problem List and Plan:  1. Left below-knee amputation secondary to peripheral vascular disease 03/22/2012  2. DVT Prophylaxis/Anticoagulation: Coumadin for DVT prophylaxis. Monitor for any bleeding episodes  3. Pain Management: Oxycodone as well as Robaxin as needed. Neurontin 400 mg 3 times a day appears effective 4. Neuropsych: This patient is capable of making decisions on his/her own behalf.  5. History of renal transplant. Continue immunosuppressant medication  6. 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 Hemoglobin A1c of 11.2. Sugars quite low.hold all scheduled insulin  -sugars have been low to normal off DM rx--follow up with pcp 7. Hypertension. Lopressor 25 mg twice a day. Monitor with increased mobility  8. Hyperlipidemia. Lovaza, Lipitor 9. Leukocytosis: wbc down to 17--likely steroids  -ua unremarkable  -wound looks good  -ortho follow up next week for suture removal  -i'll see back in about one month  -ACE wrap for now. shrinker in about 10 days  Will d/c home   LOS (Days) 8 A FACE TO FACE EVALUATION WAS PERFORMED  Alex Plotnikov 04/01/2012, 8:35 AM

## 2012-04-01 NOTE — Progress Notes (Signed)
Patient discharged approximately 0930 this morning with all belongings and family. Patient reported discussed coumadin perscription with doctor yesterday and had date when to stop. Patient verbalized understanding of D/C instructions. Al d/c instructions given via Harvel Ricks PA yesterday. Patient denied any questions.

## 2012-04-18 ENCOUNTER — Encounter: Payer: Medicare Other | Attending: Physical Medicine & Rehabilitation | Admitting: Physical Medicine & Rehabilitation

## 2012-04-18 ENCOUNTER — Encounter: Payer: Self-pay | Admitting: Physical Medicine & Rehabilitation

## 2012-04-18 ENCOUNTER — Encounter (HOSPITAL_COMMUNITY): Payer: Self-pay

## 2012-04-18 VITALS — BP 97/40 | HR 90 | Resp 16 | Ht 71.0 in | Wt 230.0 lb

## 2012-04-18 DIAGNOSIS — I10 Essential (primary) hypertension: Secondary | ICD-10-CM | POA: Insufficient documentation

## 2012-04-18 DIAGNOSIS — E119 Type 2 diabetes mellitus without complications: Secondary | ICD-10-CM | POA: Insufficient documentation

## 2012-04-18 DIAGNOSIS — I82409 Acute embolism and thrombosis of unspecified deep veins of unspecified lower extremity: Secondary | ICD-10-CM | POA: Insufficient documentation

## 2012-04-18 DIAGNOSIS — M79609 Pain in unspecified limb: Secondary | ICD-10-CM | POA: Insufficient documentation

## 2012-04-18 DIAGNOSIS — E1065 Type 1 diabetes mellitus with hyperglycemia: Secondary | ICD-10-CM

## 2012-04-18 DIAGNOSIS — Z89519 Acquired absence of unspecified leg below knee: Secondary | ICD-10-CM

## 2012-04-18 DIAGNOSIS — S88119A Complete traumatic amputation at level between knee and ankle, unspecified lower leg, initial encounter: Secondary | ICD-10-CM | POA: Insufficient documentation

## 2012-04-18 DIAGNOSIS — I739 Peripheral vascular disease, unspecified: Secondary | ICD-10-CM | POA: Insufficient documentation

## 2012-04-18 DIAGNOSIS — D72829 Elevated white blood cell count, unspecified: Secondary | ICD-10-CM | POA: Insufficient documentation

## 2012-04-18 DIAGNOSIS — Z940 Kidney transplant status: Secondary | ICD-10-CM | POA: Insufficient documentation

## 2012-04-18 DIAGNOSIS — Z7901 Long term (current) use of anticoagulants: Secondary | ICD-10-CM | POA: Insufficient documentation

## 2012-04-18 MED ORDER — OXYCODONE HCL 10 MG PO TABS: 10.0000 mg | ORAL_TABLET | Freq: Four times a day (QID) | ORAL | Status: DC | PRN

## 2012-04-18 NOTE — Progress Notes (Signed)
Subjective:    Patient ID: Trevor Thompson, male    DOB: [birth year], 51 y.o.   MRN: [id number]  HPI  Trevor Thompson is back regarding his left BKA. He is having a lot of pain, especially at the distal end of his stump. His phantom pain is better.  He has been moving around the house quite a bit but has fallen because of impulsivity. He has been following up with Dr. Lajoyce Corners for wound care management.   He is using oxycodone for breakthrough pain up to 4 -6 times per day. He denies any fever or chills.  Pain Inventory Average Pain 8 Pain Right Now 8 My pain is sharp  In the last 24 hours, has pain interfered with the following? General activity 3 Relation with others 3 Enjoyment of life 3 What TIME of day is your pain at its worst? night Sleep (in general) Fair  Pain is worse with: bending Pain improves with: rest Relief from Meds: 0  Mobility use a walker use a wheelchair  Function disabled: date disabled   Neuro/Psych trouble walking  Prior Studies Any changes since last visit?  no  Physicians involved in your care Any changes since last visit?  no   Family History  Problem Relation Age of Onset  . Diabetes Mother   . Diabetes Father   . Lupus Sister    History   Social History  . Marital Status: Married    Spouse Name: N/A    Number of Children: N/A  . Years of Education: N/A   Social History Main Topics  . Smoking status: Former Smoker -- 0.3 packs/day for 5 years    Types: Cigarettes    Quit date: 07/05/1981  . Smokeless tobacco: Never Used  . Alcohol Use: 0.6 oz/week    1 Cans of beer per week  . Drug Use: None     02/21/2012 "smoked pot in the 1970's"  . Sexually Active: Yes   Other Topics Concern  . None   Social History Narrative   Pt is married and traveling thru [place]   Past Surgical History  Procedure Date  . Kidney transplant 2004    right  . Cataract extraction w/ intraocular lens  implant, bilateral ~ 1997  . Av fistula placement  1997-2004    "I've got 3; right upper arm; right forearm; left forearm "  . Av fistula repair 1997-2004    "multiple"  . Amputation 02/25/2012    Procedure: AMPUTATION DIGIT;  Surgeon: Larina Earthly, MD;  Location: [facility] OR;  Service: Vascular;  Laterality: Left;  Left 2nd Toe Amputation, Incision and Drainage left lateral foot.  . Amputation 03/22/2012    Procedure: AMPUTATION BELOW KNEE;  Surgeon: Nadara Mustard, MD;  Location: [facility] OR;  Service: Orthopedics;  Laterality: Left;  Left Below Knee Amputation   Past Medical History  Diagnosis Date  . Type 1 diabetes mellitus, uncontrolled   . Hypertension   . Renal disorder   . PVD (peripheral vascular disease)   . S/p cadaver renal transplant   . Cold intolerance   . 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 GERD (gastroesophageal reflux disease)   . 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 Hypertriglyceridemia   . Diabetic retinopathy    BP 97/40  Pulse 90  Resp 16  Ht 5\' 11"  (1.803 m)  Wt 230 lb (104.327 kg)  BMI 32.08 kg/m2  SpO2 95%      Review of Systems  Constitutional: Negative.  HENT: Negative.   Eyes: Negative.   Respiratory: Negative.   Cardiovascular: Negative.   Gastrointestinal: Negative.   Genitourinary: Negative.   Musculoskeletal: Positive for myalgias, arthralgias and gait problem.  Skin: Negative.   Neurological: Negative.   Hematological: Negative.   Psychiatric/Behavioral: Negative.        Objective:   Physical Exam  General: Alert and oriented x 3, No apparent distress HEENT: Head is normocephalic, atraumatic, PERRLA, EOMI, sclera anicteric, oral mucosa pink and moist, dentition intact, ext ear canals clear,  Neck: Supple without JVD or lymphadenopathy Heart: Reg rate and rhythm. No murmurs rubs or gallops Chest: CTA bilaterally without wheezes, rales, or rhonchi; no distress Abdomen: Soft, non-tender, non-distended, bowel sounds positive. Extremities: No clubbing, cyanosis, or edema. Pulses  are 2+ Skin: Clean and intact without signs of breakdown Neuro: Pt is cognitively appropriate with normal insight, memory, and awareness. Cranial nerves 2-12 are intact. Sensory exam is normal. Reflexes are 2+ in all 4's. Fine motor coordination is intact. No tremors. Motor function is grossly 5/5.  Musculoskeletal: Left BKA is notable for significant fibronecrotic tissue along medial aspect of wound with drainage and odor. Multiple steristrips in place which are coming loose.  Area surrounding wound with erythema. Area is tender to palpation.  Psych: Pt's affect is appropriate. Pt is cooperative         Assessment & Plan:  1. 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 2. Coumadin for deep vein thrombosis prophylaxis, pain management.  3. History of renal transplant.  4. Diabetes mellitus.  5. Hypertension.  6. Leukocytosis  Plan: 1. Needs follow up of wound by ortho. May need further debridement of area which seems to be dehiscing and has significant necrotic tissue. We will contact Dr. Audrie Lia office regarding follow up ASAP, perhaps today. i redressed the wound today with 4x4's and kerlix. 2. Refilled oxycodone for breakthrough pain, 1 q6 prn 3. folow up with me in 6 weeks. 15 minutes of face to face patient care time were spent during this visit. All questions were encouraged and answered.

## 2012-04-18 NOTE — Patient Instructions (Signed)
Call me with questions 

## 2012-04-19 ENCOUNTER — Other Ambulatory Visit (HOSPITAL_COMMUNITY): Payer: Self-pay | Admitting: Orthopedic Surgery

## 2012-04-20 ENCOUNTER — Encounter (HOSPITAL_COMMUNITY)
Admission: RE | Admit: 2012-04-20 | Discharge: 2012-04-20 | Disposition: A | Discharge status: Home / Self Care | Payer: Medicare Other | Source: Ambulatory Visit | Attending: Orthopedic Surgery | Admitting: Orthopedic Surgery

## 2012-04-20 ENCOUNTER — Encounter (HOSPITAL_COMMUNITY): Payer: Self-pay

## 2012-04-20 HISTORY — DX: Sleep apnea, unspecified: G47.30

## 2012-04-20 LAB — COMPREHENSIVE METABOLIC PANEL
ALT: 13 U/L (ref 0–53)
AST: 18 U/L (ref 0–37)
Alkaline Phosphatase: 83 U/L (ref 39–117)
CO2: 28 mEq/L (ref 19–32)
Chloride: 103 mEq/L (ref 96–112)
GFR calc Af Amer: 78 mL/min — ABNORMAL LOW (ref >90)
GFR calc non Af Amer: 67 mL/min — ABNORMAL LOW (ref >90)
Glucose, Bld: 122 mg/dL — ABNORMAL HIGH (ref 70–99)
Potassium: 4.5 mEq/L (ref 3.5–5.1)
Sodium: 139 mEq/L (ref 135–145)

## 2012-04-20 LAB — APTT: aPTT: 34 seconds (ref 24–37)

## 2012-04-20 LAB — CBC
Hemoglobin: 12.4 g/dL — ABNORMAL LOW (ref 13.0–17.0)
RBC: 4.59 MIL/uL (ref 4.22–5.81)
WBC: 7.9 10*3/uL (ref 4.0–10.5)

## 2012-04-20 LAB — SURGICAL PCR SCREEN: Staphylococcus aureus: NEGATIVE

## 2012-04-20 MED ORDER — CLINDAMYCIN PHOSPHATE 900 MG/50ML IV SOLN
900.0000 mg | INTRAVENOUS | Status: AC
  Administered 2012-04-21: 900 mg via INTRAVENOUS
  Filled 2012-04-20: qty 50

## 2012-04-20 NOTE — Pre-Procedure Instructions (Signed)
19 Trevor Thompson  04/20/2012   Your procedure is scheduled on:  Friday, October18th.  Report to [facility] at 1100 AM.  Call this number if you have problems the morning of surgery: [phone]   Remember:   Do not eat food or anything to drink:After Midnight.     Take these medicines the morning of surgery with A SIP OF WATER: Doxycycline (Vibramycin), Esomeprazole (Nexium), Gabapentin (Neurotin), Metoprolol (Lopressor), Mycophenolate (Myfortic), Tacrolimus (Prograft).  May take Oxycodone if needed.    Do not wear jewelry, make-up or nail polish.  Do not wear lotions, powders, or perfumes. You may wear deodorant.  Do not shave 48 hours prior to surgery. Men may shave face and neck.  Do not bring valuables to the hospital.  Contacts, dentures or bridgework may not be worn into surgery.  Leave suitcase in the car. After surgery it may be brought to your room.  For patients admitted to the hospital, checkout time is 11:00 AM the day of discharge.   Patients discharged the day of surgery will not be allowed to drive home.  Name and phone number of your driver: NA  Special Instructions: Take shower with [employer] tonight 10/17 and in am 10/18.   Please read over the following fact sheets that you were given: Pain Booklet, Coughing and Deep Breathing and MRSA Information

## 2012-04-21 ENCOUNTER — Encounter (HOSPITAL_COMMUNITY): Payer: Self-pay | Admitting: Anesthesiology

## 2012-04-21 ENCOUNTER — Encounter (HOSPITAL_COMMUNITY)
Admission: RE | Disposition: A | Discharge status: Home Health | Payer: Self-pay | Source: Ambulatory Visit | Attending: Orthopedic Surgery

## 2012-04-21 ENCOUNTER — Inpatient Hospital Stay (HOSPITAL_COMMUNITY)
Admission: RE | Admit: 2012-04-21 | Discharge: 2012-04-22 | DRG: 475 | Disposition: A | Discharge status: Home Health | Payer: Medicare Other | Source: Ambulatory Visit | Attending: Orthopedic Surgery | Admitting: Orthopedic Surgery

## 2012-04-21 ENCOUNTER — Inpatient Hospital Stay (HOSPITAL_COMMUNITY): Payer: Medicare Other | Admitting: Anesthesiology

## 2012-04-21 DIAGNOSIS — Z940 Kidney transplant status: Secondary | ICD-10-CM

## 2012-04-21 DIAGNOSIS — Z880 Allergy status to penicillin: Secondary | ICD-10-CM

## 2012-04-21 DIAGNOSIS — E1039 Type 1 diabetes mellitus with other diabetic ophthalmic complication: Secondary | ICD-10-CM | POA: Diagnosis present

## 2012-04-21 DIAGNOSIS — I10 Essential (primary) hypertension: Secondary | ICD-10-CM | POA: Diagnosis present

## 2012-04-21 DIAGNOSIS — Z87891 Personal history of nicotine dependence: Secondary | ICD-10-CM

## 2012-04-21 DIAGNOSIS — E669 Obesity, unspecified: Secondary | ICD-10-CM | POA: Diagnosis present

## 2012-04-21 DIAGNOSIS — T8789 Other complications of amputation stump: Principal | ICD-10-CM | POA: Diagnosis present

## 2012-04-21 DIAGNOSIS — Z01812 Encounter for preprocedural laboratory examination: Secondary | ICD-10-CM

## 2012-04-21 DIAGNOSIS — S88119A Complete traumatic amputation at level between knee and ankle, unspecified lower leg, initial encounter: Secondary | ICD-10-CM

## 2012-04-21 DIAGNOSIS — K219 Gastro-esophageal reflux disease without esophagitis: Secondary | ICD-10-CM | POA: Diagnosis present

## 2012-04-21 DIAGNOSIS — T8781 Dehiscence of amputation stump: Secondary | ICD-10-CM

## 2012-04-21 DIAGNOSIS — G4730 Sleep apnea, unspecified: Secondary | ICD-10-CM | POA: Diagnosis present

## 2012-04-21 DIAGNOSIS — G909 Disorder of the autonomic nervous system, unspecified: Secondary | ICD-10-CM | POA: Diagnosis present

## 2012-04-21 DIAGNOSIS — Z23 Encounter for immunization: Secondary | ICD-10-CM

## 2012-04-21 DIAGNOSIS — E1049 Type 1 diabetes mellitus with other diabetic neurological complication: Secondary | ICD-10-CM | POA: Diagnosis present

## 2012-04-21 DIAGNOSIS — Z792 Long term (current) use of antibiotics: Secondary | ICD-10-CM

## 2012-04-21 DIAGNOSIS — Y92009 Unspecified place in unspecified non-institutional (private) residence as the place of occurrence of the external cause: Secondary | ICD-10-CM

## 2012-04-21 DIAGNOSIS — I739 Peripheral vascular disease, unspecified: Secondary | ICD-10-CM | POA: Diagnosis present

## 2012-04-21 DIAGNOSIS — N289 Disorder of kidney and ureter, unspecified: Secondary | ICD-10-CM | POA: Diagnosis present

## 2012-04-21 DIAGNOSIS — Y835 Amputation of limb(s) as the cause of abnormal reaction of the patient, or of later complication, without mention of misadventure at the time of the procedure: Secondary | ICD-10-CM | POA: Diagnosis present

## 2012-04-21 DIAGNOSIS — IMO0002 Reserved for concepts with insufficient information to code with codable children: Secondary | ICD-10-CM

## 2012-04-21 DIAGNOSIS — E781 Pure hyperglyceridemia: Secondary | ICD-10-CM | POA: Diagnosis present

## 2012-04-21 DIAGNOSIS — E11319 Type 2 diabetes mellitus with unspecified diabetic retinopathy without macular edema: Secondary | ICD-10-CM | POA: Diagnosis present

## 2012-04-21 DIAGNOSIS — Z79899 Other long term (current) drug therapy: Secondary | ICD-10-CM

## 2012-04-21 DIAGNOSIS — Z794 Long term (current) use of insulin: Secondary | ICD-10-CM

## 2012-04-21 HISTORY — PX: AMPUTATION: SHX166

## 2012-04-21 LAB — GLUCOSE, CAPILLARY: Glucose-Capillary: 124 mg/dL — ABNORMAL HIGH (ref 70–99)

## 2012-04-21 SURGERY — AMPUTATION BELOW KNEE
Anesthesia: General | Site: Leg Lower | Laterality: Left | Wound class: Dirty or Infected

## 2012-04-21 MED ORDER — SODIUM CHLORIDE 0.9 % IV SOLN
INTRAVENOUS | Status: DC | PRN
  Administered 2012-04-21: 13:00:00 via INTRAVENOUS

## 2012-04-21 MED ORDER — ONDANSETRON HCL 4 MG/2ML IJ SOLN: 4.0000 mg | Freq: Four times a day (QID) | INTRAMUSCULAR | Status: DC | PRN

## 2012-04-21 MED ORDER — OXYCODONE HCL 5 MG/5ML PO SOLN
5.0000 mg | Freq: Once | ORAL | Status: AC | PRN
Start: 2012-04-21 — End: 2012-04-21

## 2012-04-21 MED ORDER — METHOCARBAMOL 100 MG/ML IJ SOLN
500.0000 mg | Freq: Four times a day (QID) | INTRAVENOUS | Status: DC | PRN
  Filled 2012-04-21: qty 5

## 2012-04-21 MED ORDER — PHENYLEPHRINE HCL 10 MG/ML IJ SOLN
INTRAMUSCULAR | Status: DC | PRN
  Administered 2012-04-21 (×2): 80 ug via INTRAVENOUS
  Administered 2012-04-21: 120 ug via INTRAVENOUS
  Administered 2012-04-21: 80 ug via INTRAVENOUS

## 2012-04-21 MED ORDER — INSULIN ASPART 100 UNIT/ML ~~LOC~~ SOLN
4.0000 [IU] | Freq: Three times a day (TID) | SUBCUTANEOUS | Status: DC
  Administered 2012-04-22 (×2): 4 [IU] via SUBCUTANEOUS

## 2012-04-21 MED ORDER — MYCOPHENOLATE SODIUM 180 MG PO TBEC
360.0000 mg | DELAYED_RELEASE_TABLET | Freq: Two times a day (BID) | ORAL | Status: DC
  Administered 2012-04-21 – 2012-04-22 (×2): 360 mg via ORAL
  Filled 2012-04-21 (×3): qty 2

## 2012-04-21 MED ORDER — HYDROMORPHONE HCL PF 1 MG/ML IJ SOLN
INTRAMUSCULAR | Status: AC
  Filled 2012-04-21: qty 1

## 2012-04-21 MED ORDER — MYCOPHENOLATE SODIUM 180 MG PO TBEC: 360.0000 mg | DELAYED_RELEASE_TABLET | Freq: Two times a day (BID) | ORAL | Status: DC

## 2012-04-21 MED ORDER — MIDAZOLAM HCL 2 MG/2ML IJ SOLN: 1.0000 mg | INTRAMUSCULAR | Status: DC | PRN

## 2012-04-21 MED ORDER — METHOCARBAMOL 500 MG PO TABS
500.0000 mg | ORAL_TABLET | Freq: Four times a day (QID) | ORAL | Status: DC | PRN
  Administered 2012-04-21: 500 mg via ORAL
  Filled 2012-04-21: qty 1

## 2012-04-21 MED ORDER — ACETAMINOPHEN 10 MG/ML IV SOLN
1000.0000 mg | Freq: Once | INTRAVENOUS | Status: AC
  Administered 2012-04-21: 1000 mg via INTRAVENOUS
  Filled 2012-04-21: qty 100

## 2012-04-21 MED ORDER — METHOCARBAMOL 100 MG/ML IJ SOLN
500.0000 mg | INTRAVENOUS | Status: AC
  Administered 2012-04-21: 500 mg via INTRAVENOUS
  Filled 2012-04-21: qty 5

## 2012-04-21 MED ORDER — METOCLOPRAMIDE HCL 10 MG PO TABS: 5.0000 mg | ORAL_TABLET | Freq: Three times a day (TID) | ORAL | Status: DC | PRN

## 2012-04-21 MED ORDER — GABAPENTIN 400 MG PO CAPS
400.0000 mg | ORAL_CAPSULE | Freq: Three times a day (TID) | ORAL | Status: DC
  Administered 2012-04-21 – 2012-04-22 (×2): 400 mg via ORAL
  Filled 2012-04-21 (×4): qty 1

## 2012-04-21 MED ORDER — FENTANYL CITRATE 0.05 MG/ML IJ SOLN: 50.0000 ug | Freq: Once | INTRAMUSCULAR | Status: DC

## 2012-04-21 MED ORDER — HYDROMORPHONE BOLUS VIA INFUSION: 0.5000 mg | INTRAVENOUS | Status: DC | PRN

## 2012-04-21 MED ORDER — PREDNISONE 5 MG PO TABS
5.0000 mg | ORAL_TABLET | Freq: Every day | ORAL | Status: DC
  Administered 2012-04-22: 5 mg via ORAL
  Filled 2012-04-21 (×2): qty 1

## 2012-04-21 MED ORDER — HYDROCODONE-ACETAMINOPHEN 5-325 MG PO TABS
1.0000 | ORAL_TABLET | ORAL | Status: DC | PRN
  Administered 2012-04-22: 2 via ORAL
  Filled 2012-04-21: qty 2

## 2012-04-21 MED ORDER — HYDROMORPHONE HCL PF 1 MG/ML IJ SOLN
0.5000 mg | INTRAMUSCULAR | Status: DC | PRN
  Administered 2012-04-22 (×3): 1 mg via INTRAVENOUS
  Filled 2012-04-21 (×3): qty 1

## 2012-04-21 MED ORDER — METOCLOPRAMIDE HCL 5 MG/ML IJ SOLN: 5.0000 mg | Freq: Three times a day (TID) | INTRAMUSCULAR | Status: DC | PRN

## 2012-04-21 MED ORDER — INSULIN GLARGINE 100 UNIT/ML ~~LOC~~ SOLN
10.0000 [IU] | Freq: Every day | SUBCUTANEOUS | Status: DC
  Administered 2012-04-21: 10 [IU] via SUBCUTANEOUS

## 2012-04-21 MED ORDER — PROMETHAZINE HCL 25 MG/ML IJ SOLN: 6.2500 mg | INTRAMUSCULAR | Status: DC | PRN

## 2012-04-21 MED ORDER — ATORVASTATIN CALCIUM 10 MG PO TABS
10.0000 mg | ORAL_TABLET | Freq: Every day | ORAL | Status: DC
  Administered 2012-04-21: 10 mg via ORAL
  Filled 2012-04-21 (×2): qty 1

## 2012-04-21 MED ORDER — OXYCODONE-ACETAMINOPHEN 5-325 MG PO TABS
1.0000 | ORAL_TABLET | ORAL | Status: DC | PRN
  Administered 2012-04-21 (×2): 2 via ORAL
  Filled 2012-04-21 (×2): qty 2

## 2012-04-21 MED ORDER — PROPOFOL 10 MG/ML IV BOLUS
INTRAVENOUS | Status: DC | PRN
  Administered 2012-04-21: 200 mg via INTRAVENOUS

## 2012-04-21 MED ORDER — PANTOPRAZOLE SODIUM 40 MG PO TBEC
40.0000 mg | DELAYED_RELEASE_TABLET | Freq: Every day | ORAL | Status: DC
  Administered 2012-04-22: 40 mg via ORAL
  Filled 2012-04-21: qty 1

## 2012-04-21 MED ORDER — LIDOCAINE HCL (CARDIAC) 20 MG/ML IV SOLN
INTRAVENOUS | Status: DC | PRN
  Administered 2012-04-21: 100 mg via INTRAVENOUS

## 2012-04-21 MED ORDER — SODIUM CHLORIDE 0.9 % IV SOLN
INTRAVENOUS | Status: DC
  Administered 2012-04-21: 18:00:00 via INTRAVENOUS

## 2012-04-21 MED ORDER — INSULIN ASPART 100 UNIT/ML ~~LOC~~ SOLN
0.0000 [IU] | Freq: Three times a day (TID) | SUBCUTANEOUS | Status: DC
  Administered 2012-04-21: 18:00:00 via SUBCUTANEOUS
  Administered 2012-04-22: 3 [IU] via SUBCUTANEOUS

## 2012-04-21 MED ORDER — OXYCODONE HCL 5 MG PO TABS
5.0000 mg | ORAL_TABLET | Freq: Once | ORAL | Status: AC | PRN
  Administered 2012-04-21: 5 mg via ORAL

## 2012-04-21 MED ORDER — 0.9 % SODIUM CHLORIDE (POUR BTL) OPTIME
TOPICAL | Status: DC | PRN
  Administered 2012-04-21: 1000 mL

## 2012-04-21 MED ORDER — MIDAZOLAM HCL 5 MG/5ML IJ SOLN
INTRAMUSCULAR | Status: DC | PRN
  Administered 2012-04-21: 2 mg via INTRAVENOUS

## 2012-04-21 MED ORDER — OXYCODONE HCL 5 MG PO TABS
ORAL_TABLET | ORAL | Status: AC
  Filled 2012-04-21: qty 1

## 2012-04-21 MED ORDER — ACETAMINOPHEN 10 MG/ML IV SOLN
INTRAVENOUS | Status: AC
  Filled 2012-04-21: qty 100

## 2012-04-21 MED ORDER — METOPROLOL TARTRATE 25 MG PO TABS
25.0000 mg | ORAL_TABLET | Freq: Two times a day (BID) | ORAL | Status: DC
  Administered 2012-04-21 – 2012-04-22 (×2): 25 mg via ORAL
  Filled 2012-04-21 (×3): qty 1

## 2012-04-21 MED ORDER — TACROLIMUS 1 MG PO CAPS
4.0000 mg | ORAL_CAPSULE | Freq: Two times a day (BID) | ORAL | Status: DC
  Administered 2012-04-21 – 2012-04-22 (×2): 4 mg via ORAL
  Filled 2012-04-21 (×3): qty 4

## 2012-04-21 MED ORDER — ONDANSETRON HCL 4 MG PO TABS: 4.0000 mg | ORAL_TABLET | Freq: Four times a day (QID) | ORAL | Status: DC | PRN

## 2012-04-21 MED ORDER — CLINDAMYCIN PHOSPHATE 600 MG/50ML IV SOLN
600.0000 mg | Freq: Four times a day (QID) | INTRAVENOUS | Status: AC
  Administered 2012-04-21 – 2012-04-22 (×3): 600 mg via INTRAVENOUS
  Filled 2012-04-21 (×3): qty 50

## 2012-04-21 MED ORDER — HYDROMORPHONE HCL PF 1 MG/ML IJ SOLN
0.2500 mg | INTRAMUSCULAR | Status: DC | PRN
  Administered 2012-04-21 (×5): 0.5 mg via INTRAVENOUS

## 2012-04-21 SURGICAL SUPPLY — 44 items
BANDAGE ESMARK 6X9 LF (GAUZE/BANDAGES/DRESSINGS) ×1 IMPLANT
BANDAGE GAUZE ELAST BULKY 4 IN (GAUZE/BANDAGES/DRESSINGS) ×4 IMPLANT
BLADE SAW RECIP 87.9 MT (BLADE) ×2 IMPLANT
BLADE SURG 21 STRL SS (BLADE) ×2 IMPLANT
BNDG COHESIVE 6X5 TAN STRL LF (GAUZE/BANDAGES/DRESSINGS) ×4 IMPLANT
BNDG ESMARK 6X9 LF (GAUZE/BANDAGES/DRESSINGS) ×2
CLOTH BEACON ORANGE TIMEOUT ST (SAFETY) ×2 IMPLANT
COVER SURGICAL LIGHT HANDLE (MISCELLANEOUS) ×2 IMPLANT
CUFF TOURNIQUET SINGLE 34IN LL (TOURNIQUET CUFF) IMPLANT
CUFF TOURNIQUET SINGLE 44IN (TOURNIQUET CUFF) IMPLANT
DRAIN PENROSE 1/2X12 LTX STRL (WOUND CARE) IMPLANT
DRAPE EXTREMITY T 121X128X90 (DRAPE) ×2 IMPLANT
DRAPE PROXIMA HALF (DRAPES) ×4 IMPLANT
DRAPE U-SHAPE 47X51 STRL (DRAPES) ×4 IMPLANT
DRSG ADAPTIC 3X8 NADH LF (GAUZE/BANDAGES/DRESSINGS) ×2 IMPLANT
DRSG PAD ABDOMINAL 8X10 ST (GAUZE/BANDAGES/DRESSINGS) ×2 IMPLANT
DURAPREP 26ML APPLICATOR (WOUND CARE) ×2 IMPLANT
ELECT REM PT RETURN 9FT ADLT (ELECTROSURGICAL) ×2
ELECTRODE REM PT RTRN 9FT ADLT (ELECTROSURGICAL) ×1 IMPLANT
EVACUATOR 1/8 PVC DRAIN (DRAIN) IMPLANT
GLOVE BIOGEL PI IND STRL 9 (GLOVE) ×1 IMPLANT
GLOVE BIOGEL PI INDICATOR 9 (GLOVE) ×1
GLOVE SURG ORTHO 9.0 STRL STRW (GLOVE) ×2 IMPLANT
GOWN PREVENTION PLUS XLARGE (GOWN DISPOSABLE) ×2 IMPLANT
GOWN SRG XL XLNG 56XLVL 4 (GOWN DISPOSABLE) ×1 IMPLANT
GOWN STRL NON-REIN XL XLG LVL4 (GOWN DISPOSABLE) ×1
KIT BASIN OR (CUSTOM PROCEDURE TRAY) ×2 IMPLANT
KIT ROOM TURNOVER OR (KITS) ×2 IMPLANT
MANIFOLD NEPTUNE II (INSTRUMENTS) ×2 IMPLANT
NS IRRIG 1000ML POUR BTL (IV SOLUTION) ×2 IMPLANT
PACK GENERAL/GYN (CUSTOM PROCEDURE TRAY) ×2 IMPLANT
PAD ARMBOARD 7.5X6 YLW CONV (MISCELLANEOUS) ×4 IMPLANT
SPONGE GAUZE 4X4 12PLY (GAUZE/BANDAGES/DRESSINGS) ×2 IMPLANT
SPONGE LAP 18X18 X RAY DECT (DISPOSABLE) IMPLANT
STAPLER VISISTAT 35W (STAPLE) IMPLANT
STOCKINETTE IMPERVIOUS LG (DRAPES) ×2 IMPLANT
SUT PDS AB 1 CT  36 (SUTURE)
SUT PDS AB 1 CT 36 (SUTURE) IMPLANT
SUT SILK 2 0 (SUTURE) ×1
SUT SILK 2-0 18XBRD TIE 12 (SUTURE) ×1 IMPLANT
TOWEL OR 17X24 6PK STRL BLUE (TOWEL DISPOSABLE) ×2 IMPLANT
TOWEL OR 17X26 10 PK STRL BLUE (TOWEL DISPOSABLE) ×2 IMPLANT
TUBE ANAEROBIC SPECIMEN COL (MISCELLANEOUS) IMPLANT
WATER STERILE IRR 1000ML POUR (IV SOLUTION) ×2 IMPLANT

## 2012-04-21 NOTE — Anesthesia Preprocedure Evaluation (Addendum)
Anesthesia Evaluation  Patient identified by MRN, date of birth, ID band Patient awake    Reviewed: Allergy & Precautions, H&P , NPO status , Patient's Chart, lab work & pertinent test results, reviewed documented beta blocker date and time   Airway Mallampati: II TM Distance: >3 FB Neck ROM: Full    Dental  (+) Teeth Intact and Dental Advisory Given   Pulmonary sleep apnea , former smoker,  + rhonchi         Cardiovascular hypertension, Pt. on home beta blockers + Peripheral Vascular Disease Rhythm:Regular Rate:Normal     Neuro/Psych  Neuromuscular disease    GI/Hepatic GERD-  Controlled and Medicated,  Endo/Other  diabetes, Poorly Controlled, Type 1, Insulin Dependent  Renal/GU Renal InsufficiencyRenal disease     Musculoskeletal   Abdominal (+) + obese,   Peds  Hematology   Anesthesia Other Findings   Reproductive/Obstetrics                          Anesthesia Physical Anesthesia Plan  ASA: III  Anesthesia Plan: General   Post-op Pain Management:    Induction: Intravenous  Airway Management Planned: LMA  Additional Equipment:   Intra-op Plan:   Post-operative Plan: Extubation in OR  Informed Consent: I have reviewed the patients History and Physical, chart, labs and discussed the procedure including the risks, benefits and alternatives for the proposed anesthesia with the patient or authorized representative who has indicated his/her understanding and acceptance.     Plan Discussed with: CRNA and Surgeon  Anesthesia Plan Comments:         Anesthesia Quick Evaluation

## 2012-04-21 NOTE — Progress Notes (Signed)
Transported with O2 via stretcher to room with nurse

## 2012-04-21 NOTE — Anesthesia Postprocedure Evaluation (Signed)
Anesthesia Post Note  Patient: Trevor Thompson  Procedure(s) Performed: Procedure(s) (LRB): AMPUTATION BELOW KNEE (Left)  Anesthesia type: general  Patient location: PACU  Post pain: Pain level controlled  Post assessment: Patient's Cardiovascular Status Stable  Last Vitals:  Filed Vitals:   04/21/12 1530  BP: 111/56  Pulse: 86  Temp:   Resp: 12    Post vital signs: Reviewed and stable  Level of consciousness: sedated  Complications: No apparent anesthesia complications

## 2012-04-21 NOTE — Transfer of Care (Signed)
Immediate Anesthesia Transfer of Care Note  Patient: Trevor Thompson  Procedure(s) Performed: Procedure(s) (LRB) with comments: AMPUTATION BELOW KNEE (Left) - Left below knee amputation revision  Patient Location: PACU  Anesthesia Type: General  Level of Consciousness: awake, alert , oriented and patient cooperative  Airway & Oxygen Therapy: Patient Spontanous Breathing and Patient connected to nasal cannula oxygen  Post-op Assessment: Report given to PACU RN, Post -op Vital signs reviewed and stable and Patient moving all extremities X 4  Post vital signs: Reviewed and stable  Complications: No apparent anesthesia complications

## 2012-04-21 NOTE — H&P (Signed)
Trevor Thompson is an 51 y.o. male.   Chief Complaint: Dehiscence left transtibial amputation HPI: Patient is a 51 year old gentleman type I diabetic peripheral vascular disease who presents with dehiscence of the left transtibial amputation secondary to falling on his leg. Patient presents at this time for revision.  Past Medical History  Diagnosis Date  . Type 1 diabetes mellitus, uncontrolled   . Hypertension   . Renal disorder   . PVD (peripheral vascular disease)   . S/p cadaver renal transplant   . Cold intolerance   . 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 GERD (gastroesophageal reflux disease)   . 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 Hypertriglyceridemia   . Diabetic retinopathy   . Sleep apnea     in the past .  Was cleared in 2004 to not wear CPap.  Dr in [facility]    Past Surgical History  Procedure Date  . Kidney transplant 2004    right  . Cataract extraction w/ intraocular lens  implant, bilateral ~ 1997  . Av fistula placement 1997-2004    "I've got 3; right upper arm; right forearm; left forearm "  . Av fistula repair 1997-2004    "multiple"  . Amputation 02/25/2012    Procedure: AMPUTATION DIGIT;  Surgeon: Larina Earthly, MD;  Location: [facility] OR;  Service: Vascular;  Laterality: Left;  Left 2nd Toe Amputation, Incision and Drainage left lateral foot.  . Amputation 03/22/2012    Procedure: AMPUTATION BELOW KNEE;  Surgeon: Nadara Mustard, MD;  Location: [facility] OR;  Service: Orthopedics;  Laterality: Left;  Left Below Knee Amputation  . Eye surgery     cataract bil    Family History  Problem Relation Age of Onset  . Diabetes Mother   . Diabetes Father   . Lupus Sister    Social History:  reports that he quit smoking about 30 years ago. His smoking use included Cigarettes. He has a 1.5 pack-year smoking history. He has never used smokeless tobacco. He reports that he does not drink alcohol or use illicit drugs.  Allergies:  Allergies  Allergen Reactions  .  Penicillins Other (See Comments)    Since birth    Medications Prior to Admission  Medication Sig Dispense Refill  . doxycycline (VIBRAMYCIN) 100 MG capsule Take 100 mg by mouth 2 (two) times daily. Started on 04/18/12, for 30 days      . esomeprazole (NEXIUM) 40 MG capsule Take 40 mg by mouth daily before breakfast.      . gabapentin (NEURONTIN) 400 MG capsule Take 1 capsule (400 mg total) by mouth 3 (three) times daily.  90 capsule  1  . insulin aspart (NOVOLOG) 100 UNIT/ML injection Inject 3 Units into the skin 3 (three) times daily before meals.  1 vial  0  . metoprolol tartrate (LOPRESSOR) 25 MG tablet Take 25 mg by mouth 2 (two) times daily.      . mycophenolate (MYFORTIC) 360 MG TBEC Take 360 mg by mouth 2 (two) times daily.       Marland Kitchen omega-3 acid ethyl esters (LOVAZA) 1 G capsule Take 2 g by mouth 2 (two) times daily.      . Oxycodone HCl 10 MG TABS Take 1 tablet (10 mg total) by mouth 4 (four) times daily as needed.  90 tablet  0  . predniSONE (DELTASONE) 5 MG tablet Take 5 mg by mouth daily.      . rosuvastatin (CRESTOR) 5 MG tablet Take 5 mg  by mouth daily.      . tacrolimus (PROGRAF) 1 MG capsule Take 4 mg by mouth 2 (two) times daily.        Results for orders placed during the hospital encounter of 04/21/12 (from the past 48 hour(s))  GLUCOSE, CAPILLARY     Status: Abnormal   Collection Time   04/21/12 11:36 AM      Component Value Range Comment   Glucose-Capillary 136 (*) 70 - 99 mg/dL    No results found.  Review of Systems  All other systems reviewed and are negative.    Blood pressure 122/77, pulse 75, temperature 97.9 F (36.6 C), temperature source Oral, resp. rate 18, SpO2 97.00%. Physical Exam  On examination patient has a complete necrosis and dehiscence of his transtibial amputation status post falling on the residual limb. Assessment/Plan Assessment: Necrosis dehiscence left transtibial amputation.  Plan: We'll plan for revision of the transtibial  amputation on the left. Risks and benefits were discussed including infection neurovascular injury nonhealing of the wound need for higher level amputation. Patient states he understands and wished to proceed at this time.  Thompson,Trevor V 04/21/2012, 12:21 PM

## 2012-04-21 NOTE — Progress Notes (Signed)
BKA elevated on pillow dressing dry and intact

## 2012-04-21 NOTE — Op Note (Signed)
OPERATIVE REPORT  DATE OF SURGERY: 04/21/2012  PATIENT:  Trevor Thompson,  51 y.o. male  PRE-OPERATIVE DIAGNOSIS:  Necrosis left BKA  POST-OPERATIVE DIAGNOSIS:  necrosis left below knee amputation  PROCEDURE:  Procedure(s): AMPUTATION BELOW KNEE revision.  SURGEON:  Surgeon(s): Nadara Mustard, MD  ANESTHESIA:   general  EBL:  Minimal ML  SPECIMEN:  No Specimen  TOURNIQUET:  * No tourniquets in log *  PROCEDURE DETAILS: Patient is a 51 year old gentleman type II diabetic status post a left transtibial amputation. Patient was a month out essentially healed the wound when he fell on the wound sustaining dehiscence and necrosis of the entire incision. Do to the breakdown of the wound patient presents at this time for revision amputation. Risks and benefits were discussed patient states he understands and wished to proceed at this time. Description of procedure patient was brought to the operating room and underwent a general anesthetic. After adequate levels of anesthesia were obtained patient's left lower extremity was prepped using DuraPrep draped into a sterile field. A fishmouth incision was made around the necrotic tissue and this was carried down to bone the distal tibia and fibula resected approximately 2 cm. The vascular bundles were suture ligated with 2-0 silk. Hemostasis was obtained. The incision was closed using 2-0 nylon and staples. Wound was covered with Adaptic orthopedic sponges ABDs dressing Kerlix and Coban. Patient was extubated taken to the PACU in stable condition.  PLAN OF CARE: Discharge to home after PACU  PATIENT DISPOSITION:  PACU - hemodynamically stable.   Nadara Mustard, MD 04/21/2012 2:25 PM

## 2012-04-22 LAB — GLUCOSE, CAPILLARY: Glucose-Capillary: 194 mg/dL — ABNORMAL HIGH (ref 70–99)

## 2012-04-22 NOTE — Discharge Summary (Signed)
Physician Discharge Summary  Patient ID: Trevor Thompson MRN: [id number] DOB/AGE: July 16, 1960 51 y.o.  Admit date: 04/21/2012 Discharge date: 04/22/2012  Admission Diagnoses: Dehiscence left transtibial amputation  Discharge Diagnoses: Same Active Problems:  * No active hospital problems. *    Discharged Condition: stable  Hospital Course: Patient's hospital course was essentially unremarkable. He underwent revision of the left transtibial amputation. Postoperatively he progressed well the dressing was clean and dry and was discharged to home in stable condition.  Consults: None  Significant Diagnostic Studies: labs: Routine labs  Treatments: surgery: See operative note  Discharge Exam: Blood pressure 114/67, pulse 74, temperature 97.5 F (36.4 C), temperature source Oral, resp. rate 18, SpO2 100.00%. Incision/Wound: dressing clean and dry and intact  Disposition: 06-Home-Health Care Svc  Discharge Orders    Future Appointments: Provider: Department: Dept Phone: Center:   05/19/2012 10:20 AM Ranelle Oyster, MD [facility] [phone] CPR     Future Orders Please Complete By Expires   Diet - low sodium heart healthy      Diet - low sodium heart healthy      Call MD / Call 911      Comments:   If you experience chest pain or shortness of breath, CALL 911 and be transported to the hospital emergency room.  If you develope a fever above 101 F, pus (white drainage) or increased drainage or redness at the wound, or calf pain, call your surgeon's office.   Constipation Prevention      Comments:   Drink plenty of fluids.  Prune juice may be helpful.  You may use a stool softener, such as Colace (over the counter) 100 mg twice a day.  Use MiraLax (over the counter) for constipation as needed.   Increase activity slowly as tolerated      Call MD / Call 911      Comments:   If you experience chest pain or shortness of breath, CALL 911 and be transported to the hospital  emergency room.  If you develope a fever above 101 F, pus (white drainage) or increased drainage or redness at the wound, or calf pain, call your surgeon's office.   Constipation Prevention      Comments:   Drink plenty of fluids.  Prune juice may be helpful.  You may use a stool softener, such as Colace (over the counter) 100 mg twice a day.  Use MiraLax (over the counter) for constipation as needed.   Increase activity slowly as tolerated          Medication List     As of 04/22/2012  9:48 AM    TAKE these medications         doxycycline 100 MG capsule   Commonly known as: VIBRAMYCIN   Take 100 mg by mouth 2 (two) times daily. Started on 04/18/12, for 30 days      esomeprazole 40 MG capsule   Commonly known as: NEXIUM   Take 40 mg by mouth daily before breakfast.      gabapentin 400 MG capsule   Commonly known as: NEURONTIN   Take 1 capsule (400 mg total) by mouth 3 (three) times daily.      insulin aspart 100 UNIT/ML injection   Commonly known as: novoLOG   Inject 3 Units into the skin 3 (three) times daily before meals.      metoprolol tartrate 25 MG tablet   Commonly known as: LOPRESSOR   Take 25 mg by mouth  2 (two) times daily.      mycophenolate 360 MG Tbec   Commonly known as: MYFORTIC   Take 360 mg by mouth 2 (two) times daily.      omega-3 acid ethyl esters 1 G capsule   Commonly known as: LOVAZA   Take 2 g by mouth 2 (two) times daily.      Oxycodone HCl 10 MG Tabs   Take 1 tablet (10 mg total) by mouth 4 (four) times daily as needed.      predniSONE 5 MG tablet   Commonly known as: DELTASONE   Take 5 mg by mouth daily.      rosuvastatin 5 MG tablet   Commonly known as: CRESTOR   Take 5 mg by mouth daily.      tacrolimus 1 MG capsule   Commonly known as: PROGRAF   Take 4 mg by mouth 2 (two) times daily.           Follow-up Information    Follow up with DUDA,[name], MD. In 2 weeks.   Contact information:   [address] [place]  [place] [address] [phone]          Signed: Nadara Mustard 04/22/2012, 9:48 AM

## 2012-04-22 NOTE — Progress Notes (Signed)
Pt discharged to home via car by wife. Discharge instructions explained

## 2012-04-25 ENCOUNTER — Encounter (HOSPITAL_COMMUNITY): Payer: Self-pay | Admitting: Orthopedic Surgery

## 2012-05-17 ENCOUNTER — Telehealth: Payer: Self-pay

## 2012-05-17 NOTE — Telephone Encounter (Signed)
Advised patient he would need to have is PCP fill this.  His PCP refused because they are in [place].  Advised him he can buy these otc without a script.  He said that was not an option.  Offered him phone numbers to local family practices and he said he did not have a pen and did not want to deal with it at this point.

## 2012-05-17 NOTE — Telephone Encounter (Signed)
Patient called requesting insulin syringes.

## 2012-05-19 ENCOUNTER — Encounter: Payer: Self-pay | Admitting: Physical Medicine & Rehabilitation

## 2012-05-19 ENCOUNTER — Encounter: Payer: Medicare Other | Attending: Physical Medicine & Rehabilitation | Admitting: Physical Medicine & Rehabilitation

## 2012-05-19 DIAGNOSIS — I82409 Acute embolism and thrombosis of unspecified deep veins of unspecified lower extremity: Secondary | ICD-10-CM | POA: Insufficient documentation

## 2012-05-19 DIAGNOSIS — M79609 Pain in unspecified limb: Secondary | ICD-10-CM | POA: Insufficient documentation

## 2012-05-19 DIAGNOSIS — E119 Type 2 diabetes mellitus without complications: Secondary | ICD-10-CM | POA: Insufficient documentation

## 2012-05-19 DIAGNOSIS — I10 Essential (primary) hypertension: Secondary | ICD-10-CM | POA: Insufficient documentation

## 2012-05-19 DIAGNOSIS — Z7901 Long term (current) use of anticoagulants: Secondary | ICD-10-CM | POA: Insufficient documentation

## 2012-05-19 DIAGNOSIS — I739 Peripheral vascular disease, unspecified: Secondary | ICD-10-CM | POA: Insufficient documentation

## 2012-05-19 DIAGNOSIS — D72829 Elevated white blood cell count, unspecified: Secondary | ICD-10-CM | POA: Insufficient documentation

## 2012-05-19 DIAGNOSIS — S88119A Complete traumatic amputation at level between knee and ankle, unspecified lower leg, initial encounter: Secondary | ICD-10-CM

## 2012-05-19 DIAGNOSIS — Z89519 Acquired absence of unspecified leg below knee: Secondary | ICD-10-CM

## 2012-05-19 DIAGNOSIS — Z940 Kidney transplant status: Secondary | ICD-10-CM | POA: Insufficient documentation

## 2012-05-19 MED ORDER — ESOMEPRAZOLE MAGNESIUM 40 MG PO CPDR: 40.0000 mg | DELAYED_RELEASE_CAPSULE | Freq: Every day | ORAL | Status: DC

## 2012-05-19 NOTE — Progress Notes (Signed)
Subjective:    Patient ID: Trevor Thompson, male    DOB: August 23, 1960, 51 y.o.   MRN: [id number]  HPI  Trevor Thompson is back regarding his left BKA. Dr. Lajoyce Corners ended up performing and I and D. His pain is much improved. He's rarely using oxycodone at this point. He sees Dr. Lajoyce Corners in follow up next week.  Pain Inventory Average Pain 4 Pain Right Now 6 My pain is dull  In the last 24 hours, has pain interfered with the following? General activity 7 Relation with others 7 Enjoyment of life 7 What TIME of day is your pain at its worst? varies Sleep (in general) Fair  Pain is worse with: standing Pain improves with: medication Relief from Meds: 7  Mobility use a walker how many minutes can you walk? 5 use a wheelchair needs help with transfers Do you have any goals in this area?  yes  Function disabled: date disabled   Neuro/Psych trouble walking  Prior Studies Any changes since last visit?  no wound care  Physicians involved in your care Any changes since last visit?  no   Family History  Problem Relation Age of Onset  . Diabetes Mother   . Diabetes Father   . Lupus Sister    History   Social History  . Marital Status: Married    Spouse Name: N/A    Number of Children: N/A  . Years of Education: N/A   Social History Main Topics  . Smoking status: Former Smoker -- 0.3 packs/day for 5 years    Types: Cigarettes    Quit date: 07/05/1981  . Smokeless tobacco: Never Used  . Alcohol Use: No  . Drug Use: No     Comment: 02/21/2012 "smoked pot in the 1970's"  . Sexually Active: Yes   Other Topics Concern  . None   Social History Narrative   Pt is married and traveling thru [place]   Past Surgical History  Procedure Date  . Kidney transplant 2004    right  . Cataract extraction w/ intraocular lens  implant, bilateral ~ 1997  . Av fistula placement 1997-2004    "I've got 3; right upper arm; right forearm; left forearm "  . Av fistula repair 1997-2004   "multiple"  . Amputation 02/25/2012    Procedure: AMPUTATION DIGIT;  Surgeon: Larina Earthly, MD;  Location: [facility] OR;  Service: Vascular;  Laterality: Left;  Left 2nd Toe Amputation, Incision and Drainage left lateral foot.  . Amputation 03/22/2012    Procedure: AMPUTATION BELOW KNEE;  Surgeon: Nadara Mustard, MD;  Location: [facility] OR;  Service: Orthopedics;  Laterality: Left;  Left Below Knee Amputation  . Eye surgery     cataract bil  . Amputation 04/21/2012    Procedure: AMPUTATION BELOW KNEE;  Surgeon: Nadara Mustard, MD;  Location: [facility] OR;  Service: Orthopedics;  Laterality: Left;  Left below knee amputation revision   Past Medical History  Diagnosis Date  . Type 1 diabetes mellitus, uncontrolled   . Hypertension   . Renal disorder   . PVD (peripheral vascular disease)   . S/p cadaver renal transplant   . Cold intolerance   . 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 GERD (gastroesophageal reflux disease)   . 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 Hypertriglyceridemia   . Diabetic retinopathy   . Sleep apnea     in the past .  Was cleared in 2004 to not wear CPap.  Dr in [place]  There were no vitals taken for this visit.     Review of Systems  Constitutional: Positive for diaphoresis.  Gastrointestinal: Positive for diarrhea.  Musculoskeletal: Positive for myalgias, arthralgias and gait problem.  All other systems reviewed and are negative.       Objective:   Physical Exam  General: Alert and oriented x 3, No apparent distress  HEENT: Head is normocephalic, atraumatic, PERRLA, EOMI, sclera anicteric, oral mucosa pink and moist, dentition intact, ext ear canals clear,  Neck: Supple without JVD or lymphadenopathy  Heart: Reg rate and rhythm. No murmurs rubs or gallops  Chest: CTA bilaterally without wheezes, rales, or rhonchi; no distress  Abdomen: Soft, non-tender, non-distended, bowel sounds positive.  Extremities: No clubbing, cyanosis, or edema. Pulses are 2+  Skin:  Clean and intact without signs of breakdown other than the left leg. Neuro: Pt is cognitively appropriate with normal insight, memory, and awareness. Cranial nerves 2-12 are intact. Sensory exam is normal. Reflexes are 2+ in all 4's. Fine motor coordination is intact. No tremors. Motor function is grossly 5/5.  Musculoskeletal: leg has healed substantially. There are two open areas still draining fibrinous drainage. There is no odor. The rest of wound is well approximated. He has a wound on the patella which is superficial.   Psych: Pt's affect is appropriate. Pt is cooperative   Assessment & Plan:   1. 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  2. Coumadin for deep vein thrombosis prophylaxis, pain management.  3. History of renal transplant.  4. Diabetes mellitus.  5. Hypertension.  6. Leukocytosis   Plan:  1. Continue wound care follow up with Dr. Lajoyce Corners. He is still likely 2 months away from being ready for a prosthesis. 2. Use oxycodone prn only--which is rare at this point 3. Follow up with me in 2 months. 15 minutes of face to face patient care time were spent during this visit. All questions were encouraged and answered

## 2012-05-19 NOTE — Addendum Note (Signed)
Addended by: Faith Rogue T on: 05/19/2012 11:23 AM   Modules accepted: Orders

## 2012-05-19 NOTE — Patient Instructions (Signed)
CALL ME WITH QUESTIONS 

## 2012-07-19 ENCOUNTER — Encounter: Payer: Medicare Other | Admitting: Physical Medicine & Rehabilitation

## 2012-07-25 ENCOUNTER — Ambulatory Visit: Payer: Medicare Other | Attending: Orthopedic Surgery | Admitting: Physical Therapy

## 2012-07-25 DIAGNOSIS — IMO0001 Reserved for inherently not codable concepts without codable children: Secondary | ICD-10-CM | POA: Insufficient documentation

## 2012-07-25 DIAGNOSIS — R5381 Other malaise: Secondary | ICD-10-CM | POA: Insufficient documentation

## 2012-07-25 DIAGNOSIS — R269 Unspecified abnormalities of gait and mobility: Secondary | ICD-10-CM | POA: Insufficient documentation

## 2012-07-25 DIAGNOSIS — S88119A Complete traumatic amputation at level between knee and ankle, unspecified lower leg, initial encounter: Secondary | ICD-10-CM | POA: Insufficient documentation

## 2012-07-27 ENCOUNTER — Ambulatory Visit: Payer: Medicare Other | Admitting: Physical Therapy

## 2012-08-02 ENCOUNTER — Ambulatory Visit: Payer: Medicare Other | Admitting: Physical Therapy

## 2012-08-04 ENCOUNTER — Ambulatory Visit: Payer: Medicare Other | Admitting: Physical Therapy

## 2012-08-04 ENCOUNTER — Encounter: Payer: Medicare Other | Admitting: Physical Medicine & Rehabilitation

## 2012-08-09 ENCOUNTER — Ambulatory Visit: Payer: Medicare Other | Attending: Orthopedic Surgery | Admitting: Physical Therapy

## 2012-08-09 DIAGNOSIS — R5381 Other malaise: Secondary | ICD-10-CM | POA: Insufficient documentation

## 2012-08-09 DIAGNOSIS — S88119A Complete traumatic amputation at level between knee and ankle, unspecified lower leg, initial encounter: Secondary | ICD-10-CM | POA: Insufficient documentation

## 2012-08-09 DIAGNOSIS — IMO0001 Reserved for inherently not codable concepts without codable children: Secondary | ICD-10-CM | POA: Insufficient documentation

## 2012-08-09 DIAGNOSIS — R269 Unspecified abnormalities of gait and mobility: Secondary | ICD-10-CM | POA: Insufficient documentation

## 2012-08-10 ENCOUNTER — Encounter (INDEPENDENT_AMBULATORY_CARE_PROVIDER_SITE_OTHER): Payer: Self-pay

## 2012-08-11 ENCOUNTER — Ambulatory Visit: Payer: Medicare Other | Admitting: Physical Therapy

## 2012-08-15 ENCOUNTER — Ambulatory Visit: Payer: Medicare Other | Admitting: Physical Therapy

## 2012-08-17 ENCOUNTER — Ambulatory Visit: Payer: Medicare Other | Admitting: Physical Therapy

## 2012-08-22 ENCOUNTER — Ambulatory Visit: Payer: Medicare Other | Admitting: Physical Therapy

## 2012-08-24 ENCOUNTER — Ambulatory Visit: Payer: Medicare Other | Admitting: Physical Therapy

## 2012-08-29 ENCOUNTER — Ambulatory Visit: Payer: Medicare Other | Admitting: Physical Therapy

## 2012-08-31 ENCOUNTER — Ambulatory Visit: Payer: Medicare Other | Admitting: Physical Therapy

## 2012-09-04 ENCOUNTER — Encounter: Payer: Medicare Other | Admitting: Physical Therapy

## 2012-09-05 ENCOUNTER — Ambulatory Visit: Payer: Medicare Other | Admitting: Physical Therapy

## 2012-09-07 ENCOUNTER — Ambulatory Visit: Payer: Medicare Other | Attending: Orthopedic Surgery | Admitting: Physical Therapy

## 2012-09-07 DIAGNOSIS — R5381 Other malaise: Secondary | ICD-10-CM | POA: Insufficient documentation

## 2012-09-07 DIAGNOSIS — R269 Unspecified abnormalities of gait and mobility: Secondary | ICD-10-CM | POA: Insufficient documentation

## 2012-09-07 DIAGNOSIS — IMO0001 Reserved for inherently not codable concepts without codable children: Secondary | ICD-10-CM | POA: Insufficient documentation

## 2012-09-07 DIAGNOSIS — S88119A Complete traumatic amputation at level between knee and ankle, unspecified lower leg, initial encounter: Secondary | ICD-10-CM | POA: Insufficient documentation

## 2012-09-12 ENCOUNTER — Ambulatory Visit: Payer: Medicare Other | Admitting: Physical Therapy

## 2012-09-19 ENCOUNTER — Ambulatory Visit: Payer: Medicare Other | Admitting: Physical Therapy

## 2012-09-21 ENCOUNTER — Ambulatory Visit: Payer: Medicare Other | Admitting: Physical Therapy

## 2012-10-25 ENCOUNTER — Ambulatory Visit: Payer: Medicare Other | Attending: Orthopedic Surgery | Admitting: Physical Therapy

## 2012-10-25 DIAGNOSIS — IMO0001 Reserved for inherently not codable concepts without codable children: Secondary | ICD-10-CM | POA: Insufficient documentation

## 2012-10-25 DIAGNOSIS — R269 Unspecified abnormalities of gait and mobility: Secondary | ICD-10-CM | POA: Insufficient documentation

## 2012-10-25 DIAGNOSIS — R5381 Other malaise: Secondary | ICD-10-CM | POA: Insufficient documentation

## 2012-10-25 DIAGNOSIS — S88119A Complete traumatic amputation at level between knee and ankle, unspecified lower leg, initial encounter: Secondary | ICD-10-CM | POA: Insufficient documentation

## 2012-10-30 ENCOUNTER — Ambulatory Visit: Payer: Medicare Other | Admitting: Physical Therapy

## 2012-11-06 ENCOUNTER — Ambulatory Visit: Payer: Medicare Other | Attending: Orthopedic Surgery | Admitting: Physical Therapy

## 2012-11-06 DIAGNOSIS — R5381 Other malaise: Secondary | ICD-10-CM | POA: Insufficient documentation

## 2012-11-06 DIAGNOSIS — R269 Unspecified abnormalities of gait and mobility: Secondary | ICD-10-CM | POA: Insufficient documentation

## 2012-11-06 DIAGNOSIS — S88119A Complete traumatic amputation at level between knee and ankle, unspecified lower leg, initial encounter: Secondary | ICD-10-CM | POA: Insufficient documentation

## 2012-11-06 DIAGNOSIS — IMO0001 Reserved for inherently not codable concepts without codable children: Secondary | ICD-10-CM | POA: Insufficient documentation

## 2012-11-13 ENCOUNTER — Ambulatory Visit: Payer: Medicare Other | Admitting: Physical Therapy

## 2012-11-16 ENCOUNTER — Ambulatory Visit: Payer: Medicare Other | Admitting: Physical Therapy

## 2012-11-20 ENCOUNTER — Ambulatory Visit: Payer: Medicare Other | Admitting: Physical Therapy

## 2012-11-30 ENCOUNTER — Ambulatory Visit: Payer: Medicare Other | Admitting: Physical Therapy

## 2013-05-09 ENCOUNTER — Encounter (HOSPITAL_COMMUNITY): Payer: Self-pay | Admitting: Emergency Medicine

## 2013-05-09 ENCOUNTER — Emergency Department (HOSPITAL_COMMUNITY): Payer: Medicare Other

## 2013-05-09 ENCOUNTER — Inpatient Hospital Stay (HOSPITAL_COMMUNITY)
Admission: EM | Admit: 2013-05-09 | Discharge: 2013-05-11 | DRG: 871 | Disposition: A | Discharge status: Home / Self Care | Payer: Medicare Other | Attending: Internal Medicine | Admitting: Internal Medicine

## 2013-05-09 DIAGNOSIS — Z79899 Other long term (current) drug therapy: Secondary | ICD-10-CM

## 2013-05-09 DIAGNOSIS — E11319 Type 2 diabetes mellitus with unspecified diabetic retinopathy without macular edema: Secondary | ICD-10-CM | POA: Diagnosis present

## 2013-05-09 DIAGNOSIS — E871 Hypo-osmolality and hyponatremia: Secondary | ICD-10-CM | POA: Diagnosis present

## 2013-05-09 DIAGNOSIS — J189 Pneumonia, unspecified organism: Secondary | ICD-10-CM

## 2013-05-09 DIAGNOSIS — E1143 Type 2 diabetes mellitus with diabetic autonomic (poly)neuropathy: Secondary | ICD-10-CM | POA: Diagnosis present

## 2013-05-09 DIAGNOSIS — Z9489 Other transplanted organ and tissue status: Secondary | ICD-10-CM

## 2013-05-09 DIAGNOSIS — N058 Unspecified nephritic syndrome with other morphologic changes: Secondary | ICD-10-CM | POA: Diagnosis present

## 2013-05-09 DIAGNOSIS — A419 Sepsis, unspecified organism: Principal | ICD-10-CM

## 2013-05-09 DIAGNOSIS — Z794 Long term (current) use of insulin: Secondary | ICD-10-CM

## 2013-05-09 DIAGNOSIS — E1165 Type 2 diabetes mellitus with hyperglycemia: Secondary | ICD-10-CM | POA: Diagnosis present

## 2013-05-09 DIAGNOSIS — E119 Type 2 diabetes mellitus without complications: Secondary | ICD-10-CM | POA: Diagnosis present

## 2013-05-09 DIAGNOSIS — Z833 Family history of diabetes mellitus: Secondary | ICD-10-CM

## 2013-05-09 DIAGNOSIS — I129 Hypertensive chronic kidney disease with stage 1 through stage 4 chronic kidney disease, or unspecified chronic kidney disease: Secondary | ICD-10-CM | POA: Diagnosis present

## 2013-05-09 DIAGNOSIS — S88119A Complete traumatic amputation at level between knee and ankle, unspecified lower leg, initial encounter: Secondary | ICD-10-CM

## 2013-05-09 DIAGNOSIS — Z87891 Personal history of nicotine dependence: Secondary | ICD-10-CM

## 2013-05-09 DIAGNOSIS — D631 Anemia in chronic kidney disease: Secondary | ICD-10-CM | POA: Diagnosis present

## 2013-05-09 DIAGNOSIS — G909 Disorder of the autonomic nervous system, unspecified: Secondary | ICD-10-CM | POA: Diagnosis present

## 2013-05-09 DIAGNOSIS — D849 Immunodeficiency, unspecified: Secondary | ICD-10-CM | POA: Diagnosis present

## 2013-05-09 DIAGNOSIS — G4733 Obstructive sleep apnea (adult) (pediatric): Secondary | ICD-10-CM | POA: Diagnosis present

## 2013-05-09 DIAGNOSIS — D649 Anemia, unspecified: Secondary | ICD-10-CM

## 2013-05-09 DIAGNOSIS — K219 Gastro-esophageal reflux disease without esophagitis: Secondary | ICD-10-CM | POA: Diagnosis present

## 2013-05-09 DIAGNOSIS — E1139 Type 2 diabetes mellitus with other diabetic ophthalmic complication: Secondary | ICD-10-CM | POA: Diagnosis present

## 2013-05-09 DIAGNOSIS — I739 Peripheral vascular disease, unspecified: Secondary | ICD-10-CM | POA: Diagnosis present

## 2013-05-09 DIAGNOSIS — N182 Chronic kidney disease, stage 2 (mild): Secondary | ICD-10-CM | POA: Diagnosis present

## 2013-05-09 DIAGNOSIS — Z940 Kidney transplant status: Secondary | ICD-10-CM

## 2013-05-09 DIAGNOSIS — E1129 Type 2 diabetes mellitus with other diabetic kidney complication: Secondary | ICD-10-CM | POA: Diagnosis present

## 2013-05-09 DIAGNOSIS — E1149 Type 2 diabetes mellitus with other diabetic neurological complication: Secondary | ICD-10-CM | POA: Diagnosis present

## 2013-05-09 HISTORY — DX: Type 2 diabetes mellitus without complications: E11.9

## 2013-05-09 HISTORY — DX: End stage renal disease: N18.6

## 2013-05-09 HISTORY — DX: Pneumonia, unspecified organism: J18.9

## 2013-05-09 HISTORY — DX: Pure hypercholesterolemia, unspecified: E78.00

## 2013-05-09 LAB — CBC
HCT: 38 % — ABNORMAL LOW (ref 39.0–52.0)
Hemoglobin: 12.3 g/dL — ABNORMAL LOW (ref 13.0–17.0)
MCH: 27.6 pg (ref 26.0–34.0)
MCHC: 32.4 g/dL (ref 30.0–36.0)
Platelets: 150 10*3/uL (ref 150–400)
RDW: 13.6 % (ref 11.5–15.5)
WBC: 18.6 10*3/uL — ABNORMAL HIGH (ref 4.0–10.5)

## 2013-05-09 LAB — POTASSIUM: Potassium: 4.6 mEq/L (ref 3.5–5.1)

## 2013-05-09 LAB — COMPREHENSIVE METABOLIC PANEL
Albumin: 3 g/dL — ABNORMAL LOW (ref 3.5–5.2)
Alkaline Phosphatase: 72 U/L (ref 39–117)
BUN: 36 mg/dL — ABNORMAL HIGH (ref 6–23)
Creatinine, Ser: 1.41 mg/dL — ABNORMAL HIGH (ref 0.50–1.35)
GFR calc non Af Amer: 55 mL/min — ABNORMAL LOW (ref >90)
Total Protein: 8.1 g/dL (ref 6.0–8.3)

## 2013-05-09 LAB — URINALYSIS, ROUTINE W REFLEX MICROSCOPIC
Glucose, UA: >1000 mg/dL — AB
Ketones, ur: NEGATIVE mg/dL
Protein, ur: 30 mg/dL — AB
Urobilinogen, UA: 0.2 mg/dL (ref 0.0–1.0)

## 2013-05-09 LAB — MRSA PCR SCREENING: MRSA by PCR: NEGATIVE

## 2013-05-09 LAB — RAPID URINE DRUG SCREEN, HOSP PERFORMED
Benzodiazepines: NOT DETECTED
Cocaine: NOT DETECTED
Opiates: NOT DETECTED
Tetrahydrocannabinol: NOT DETECTED

## 2013-05-09 LAB — CBC WITH DIFFERENTIAL/PLATELET
Basophils Absolute: 0 10*3/uL (ref 0.0–0.1)
Eosinophils Absolute: 0 10*3/uL (ref 0.0–0.7)
HCT: 38.6 % — ABNORMAL LOW (ref 39.0–52.0)
Lymphs Abs: 1.7 10*3/uL (ref 0.7–4.0)
MCHC: 33.2 g/dL (ref 30.0–36.0)
MCV: 84.6 fL (ref 78.0–100.0)
Monocytes Relative: 18 % — ABNORMAL HIGH (ref 3–12)
Neutro Abs: 12.6 10*3/uL — ABNORMAL HIGH (ref 1.7–7.7)
Neutrophils Relative %: 72 % (ref 43–77)
Platelets: 206 10*3/uL (ref 150–400)
RBC: 4.56 MIL/uL (ref 4.22–5.81)
RDW: 13.4 % (ref 11.5–15.5)

## 2013-05-09 LAB — URINE MICROSCOPIC-ADD ON

## 2013-05-09 LAB — CREATININE, SERUM
Creatinine, Ser: 1.28 mg/dL (ref 0.50–1.35)
GFR calc non Af Amer: 62 mL/min — ABNORMAL LOW (ref >90)

## 2013-05-09 LAB — TROPONIN I: Troponin I: <0.3 ng/mL (ref <0.30)

## 2013-05-09 LAB — PROCALCITONIN: Procalcitonin: 0.25 ng/mL

## 2013-05-09 LAB — MONONUCLEOSIS SCREEN: Mono Screen: NEGATIVE

## 2013-05-09 MED ORDER — MYCOPHENOLATE SODIUM 180 MG PO TBEC
360.0000 mg | DELAYED_RELEASE_TABLET | Freq: Two times a day (BID) | ORAL | Status: DC
  Filled 2013-05-09: qty 2

## 2013-05-09 MED ORDER — TACROLIMUS 1 MG PO CAPS
4.0000 mg | ORAL_CAPSULE | Freq: Two times a day (BID) | ORAL | Status: DC
  Administered 2013-05-09 – 2013-05-11 (×4): 4 mg via ORAL
  Filled 2013-05-09 (×5): qty 4

## 2013-05-09 MED ORDER — LEVOFLOXACIN IN D5W 750 MG/150ML IV SOLN
750.0000 mg | Freq: Once | INTRAVENOUS | Status: AC
  Administered 2013-05-09: 750 mg via INTRAVENOUS
  Filled 2013-05-09: qty 150

## 2013-05-09 MED ORDER — MYCOPHENOLATE SODIUM 180 MG PO TBEC
720.0000 mg | DELAYED_RELEASE_TABLET | Freq: Every morning | ORAL | Status: DC
  Administered 2013-05-10 – 2013-05-11 (×2): 720 mg via ORAL
  Filled 2013-05-09 (×2): qty 4

## 2013-05-09 MED ORDER — INSULIN ASPART 100 UNIT/ML ~~LOC~~ SOLN
0.0000 [IU] | Freq: Three times a day (TID) | SUBCUTANEOUS | Status: DC
  Administered 2013-05-10: 8 [IU] via SUBCUTANEOUS
  Administered 2013-05-10: 13:00:00 via SUBCUTANEOUS
  Administered 2013-05-10: 15 [IU] via SUBCUTANEOUS
  Administered 2013-05-11 (×2): 8 [IU] via SUBCUTANEOUS

## 2013-05-09 MED ORDER — VANCOMYCIN HCL 10 G IV SOLR
1500.0000 mg | Freq: Once | INTRAVENOUS | Status: AC
  Administered 2013-05-09: 1500 mg via INTRAVENOUS
  Filled 2013-05-09: qty 1500

## 2013-05-09 MED ORDER — SODIUM CHLORIDE 0.9 % IJ SOLN
3.0000 mL | Freq: Two times a day (BID) | INTRAMUSCULAR | Status: DC
  Administered 2013-05-10 – 2013-05-11 (×3): 3 mL via INTRAVENOUS

## 2013-05-09 MED ORDER — PREDNISONE 5 MG PO TABS
5.0000 mg | ORAL_TABLET | Freq: Every day | ORAL | Status: DC
  Administered 2013-05-09 – 2013-05-11 (×3): 5 mg via ORAL
  Filled 2013-05-09 (×3): qty 1

## 2013-05-09 MED ORDER — SODIUM CHLORIDE 0.9 % IV BOLUS (SEPSIS)
1000.0000 mL | Freq: Once | INTRAVENOUS | Status: AC
  Administered 2013-05-09: 1000 mL via INTRAVENOUS

## 2013-05-09 MED ORDER — LEVOFLOXACIN IN D5W 750 MG/150ML IV SOLN
750.0000 mg | INTRAVENOUS | Status: DC
  Administered 2013-05-10 – 2013-05-11 (×2): 750 mg via INTRAVENOUS
  Filled 2013-05-09 (×2): qty 150

## 2013-05-09 MED ORDER — MYCOPHENOLATE SODIUM 180 MG PO TBEC
360.0000 mg | DELAYED_RELEASE_TABLET | Freq: Every day | ORAL | Status: DC
  Administered 2013-05-09 – 2013-05-10 (×2): 360 mg via ORAL
  Filled 2013-05-09 (×3): qty 2

## 2013-05-09 MED ORDER — METOCLOPRAMIDE HCL 5 MG/ML IJ SOLN
10.0000 mg | Freq: Once | INTRAMUSCULAR | Status: AC
  Administered 2013-05-09: 10 mg via INTRAVENOUS
  Filled 2013-05-09: qty 2

## 2013-05-09 MED ORDER — SODIUM CHLORIDE 0.9 % IV SOLN
INTRAVENOUS | Status: DC
  Administered 2013-05-09 – 2013-05-10 (×2): via INTRAVENOUS

## 2013-05-09 MED ORDER — HYDROCODONE-ACETAMINOPHEN 5-325 MG PO TABS: 1.0000 | ORAL_TABLET | ORAL | Status: DC | PRN

## 2013-05-09 MED ORDER — DIPHENHYDRAMINE HCL 50 MG/ML IJ SOLN
25.0000 mg | Freq: Once | INTRAMUSCULAR | Status: AC
  Administered 2013-05-09: 25 mg via INTRAVENOUS
  Filled 2013-05-09: qty 1

## 2013-05-09 MED ORDER — ACETAMINOPHEN 325 MG PO TABS
650.0000 mg | ORAL_TABLET | Freq: Once | ORAL | Status: AC
  Administered 2013-05-09: 650 mg via ORAL
  Filled 2013-05-09: qty 2

## 2013-05-09 MED ORDER — HEPARIN SODIUM (PORCINE) 5000 UNIT/ML IJ SOLN
5000.0000 [IU] | Freq: Three times a day (TID) | INTRAMUSCULAR | Status: DC
  Administered 2013-05-09 – 2013-05-11 (×6): 5000 [IU] via SUBCUTANEOUS
  Filled 2013-05-09 (×8): qty 1

## 2013-05-09 MED ORDER — METHYLPREDNISOLONE SODIUM SUCC 125 MG IJ SOLR
60.0000 mg | Freq: Once | INTRAMUSCULAR | Status: AC
  Administered 2013-05-09: 60 mg via INTRAVENOUS
  Filled 2013-05-09: qty 2

## 2013-05-09 MED ORDER — PROMETHAZINE HCL 25 MG PO TABS: 12.5000 mg | ORAL_TABLET | Freq: Four times a day (QID) | ORAL | Status: DC | PRN

## 2013-05-09 MED ORDER — VANCOMYCIN HCL 1000 MG IV SOLR: 15.0000 mg/kg | Freq: Once | INTRAVENOUS | Status: DC

## 2013-05-09 MED ORDER — INFLUENZA VAC SPLIT QUAD 0.5 ML IM SUSP
0.5000 mL | INTRAMUSCULAR | Status: AC
  Administered 2013-05-11: 0.5 mL via INTRAMUSCULAR
  Filled 2013-05-09 (×2): qty 0.5

## 2013-05-09 MED ORDER — VANCOMYCIN HCL 10 G IV SOLR
1250.0000 mg | Freq: Two times a day (BID) | INTRAVENOUS | Status: DC
  Administered 2013-05-10 – 2013-05-11 (×3): 1250 mg via INTRAVENOUS
  Filled 2013-05-09 (×5): qty 1250

## 2013-05-09 NOTE — ED Provider Notes (Signed)
CSN: [id number]     Arrival date & time 05/09/13  0919 History   First MD Initiated Contact with Patient 05/09/13 [phone]     Chief Complaint  Patient presents with  . Cough  . Generalized Body Aches  . Chills  . Nausea   (Consider location/radiation/quality/duration/timing/severity/associated sxs/prior Treatment) The history is provided by the patient. No language interpreter was used.  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 Patient reported that he has been having chest pain intermittently described as a pounding sensation localized to the center of the chest that has been ongoing for at least 1-2 times per day. Patient reported that the chest pain occurs sporadically. Patient reported that he has been having shortness of breathe and difficulty breathing associated with the discomfort. Patient reported that he has been coughing up greenish colored phlegm. Patient reported that he has been feeling rather nauseous, reported that he is unable to keep anything down over the past couple of days. Patient reported that he has been experiencing chills. Denied nasal congestion, sneezing, abdominal pain, back pain, neck pain, numbness, tingling.  PCP Dr. Lowell Guitar  Past Medical History  Diagnosis Date  . Type 1 diabetes mellitus, uncontrolled   . Hypertension   . Renal disorder   . PVD (peripheral vascular disease)   . S/p cadaver renal transplant   . Cold intolerance   . 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 GERD (gastroesophageal reflux disease)   . 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 Hypertriglyceridemia   . Diabetic retinopathy   . Sleep apnea     in the past .  Was cleared in 2004 to not wear CPap.  Dr in [facility]    Past Surgical History  Procedure Laterality Date  . Kidney transplant  2004    right  . Cataract extraction w/ intraocular lens  implant, bilateral  ~ 1997  . Av fistula placement  1997-2004    "I've got 3; right upper arm; right forearm; left forearm "  . Av fistula repair  1997-2004    "multiple"  . Amputation  02/25/2012    Procedure: AMPUTATION DIGIT;  Surgeon: Larina Earthly, MD;  Location: [facility] OR;  Service: Vascular;  Laterality: Left;  Left 2nd Toe Amputation, Incision and Drainage left lateral foot.  . Amputation  03/22/2012    Procedure: AMPUTATION BELOW KNEE;  Surgeon: Nadara Mustard, MD;  Location: [facility] OR;  Service: Orthopedics;  Laterality: Left;  Left Below Knee Amputation  . Eye surgery      cataract bil  . Amputation  04/21/2012    Procedure: AMPUTATION BELOW KNEE;  Surgeon: Nadara Mustard, MD;  Location: [facility] OR;  Service: Orthopedics;  Laterality: Left;  Left below knee amputation revision   Family History  Problem Relation Age of Onset  . Diabetes Mother   . Diabetes Father   . Lupus Sister    History  Substance Use Topics  . Smoking status: Former Smoker -- 0.30 packs/day for 5 years    Types: Cigarettes    Quit date: 07/05/1981  . Smokeless tobacco: Never Used  . Alcohol Use: No    Review of Systems  Constitutional: Positive for fever. Negative for chills.  HENT: Positive  for sore throat.   Eyes: Negative for visual disturbance.  Respiratory: Positive for cough and shortness of breath.   Cardiovascular: Positive for chest pain.  Gastrointestinal: Negative for nausea, vomiting, abdominal pain and diarrhea.  Musculoskeletal: Negative for back pain and neck pain.  Neurological: Positive for headaches. Negative for dizziness and weakness.  All other systems reviewed and are negative.    Allergies  Penicillins  Home Medications   No current outpatient prescriptions on file. BP 124/77  Pulse 110  Temp(Src) 97.7 F (36.5 C) (Oral)  Resp 19  Ht 5\' 11"   (1.803 m)  Wt 215 lb 13.3 oz (97.9 kg)  BMI 30.12 kg/m2  SpO2 93% Physical Exam  Nursing note and vitals reviewed. Constitutional: He is oriented to person, place, and time. He appears well-developed and well-nourished. No distress.  HENT:  Head: Normocephalic and atraumatic.  Mouth/Throat: Oropharynx is clear and moist. No oropharyngeal exudate.  Negative erythema, inflammation, swelling, exudate noted to the posterior oropharynx. Uvula midline, symmetrical elevation. Negative signs of peritonsillar abscess.   Eyes: Conjunctivae and EOM are normal. Pupils are equal, round, and reactive to light. Right eye exhibits no discharge. Left eye exhibits no discharge.  Neck: Normal range of motion. Neck supple. No tracheal deviation present.  Negative cervical LAD  Cardiovascular: Regular rhythm and normal heart sounds.  Exam reveals no friction rub.   No murmur heard. Pulses:      Radial pulses are 2+ on the right side, and 2+ on the left side.       Dorsalis pedis pulses are 2+ on the right side.  Tachycardia   Pulmonary/Chest: Effort normal. No respiratory distress. He has no wheezes. He has no rales. He exhibits no tenderness.  Decreased breath sounds bilaterally to upper and lower lobes Negative respiratory distress   Musculoskeletal: Normal range of motion.  Lymphadenopathy:    He has no cervical adenopathy.  Neurological: He is alert and oriented to person, place, and time. He exhibits normal muscle tone. Coordination normal.  Skin: Skin is warm and dry. No rash noted. He is not diaphoretic. No erythema.  Psychiatric: He has a normal mood and affect. His behavior is normal. Thought content normal.    ED Course  Procedures (including critical care time)  1:15 PM This provider was addressed regarding drop in patient's blood pressure. Another IV placed and patient placed on IV fluids - 2 L going. Dr. Jodi Mourning placed patient on Vancomycin. Elevation in potassium noted - 6.7 - potassium  level to be re-checked. Lactic acid ordered. Possible sepsis - blood cultures ordered.   1:38 PM This provider spoke with Dr. Regina Eck, Teaching Services - discussed history, case, presentation, labs, imaging with physician. Physician to come down and assess patient for admission. Physician reported that she would put the admission orders in.   3:17 PM Attending physician recommended 60 mg IV solu-medrol to be administered.    Date: 05/09/2013  Rate: 123  Rhythm: sinus tachycardia  QRS Axis: normal  Intervals: normal  ST/T Wave abnormalities: nonspecific T wave changes  Conduction Disutrbances:none  Narrative Interpretation: low voltage precordial leads  Old EKG Reviewed: unchanged EKG analyzed and reviewed by this provider and attending physician.   13:27  Hyperkalemia - potassium level of 6.7.  Date: 05/09/2013  Rate: 115  Rhythm: sinus tachycardia  QRS Axis: normal  Intervals: normal  ST/T Wave abnormalities: normal  Conduction Disutrbances:none  Narrative Interpretation:   Old EKG Reviewed: unchanged EKG analyzed and reviewed by this provider and  attending physician.   Labs Review Labs Reviewed  CBC WITH DIFFERENTIAL - Abnormal; Notable for the following:    WBC 17.4 (*)    Hemoglobin 12.8 (*)    HCT 38.6 (*)    Lymphocytes Relative 10 (*)    Monocytes Relative 18 (*)    Neutro Abs 12.6 (*)    Monocytes Absolute 3.1 (*)    All other components within normal limits  COMPREHENSIVE METABOLIC PANEL - Abnormal; Notable for the following:    Sodium 129 (*)    Potassium 6.7 (*)    Chloride 94 (*)    Glucose, Bld 325 (*)    BUN 36 (*)    Creatinine, Ser 1.41 (*)    Albumin 3.0 (*)    AST 44 (*)    GFR calc non Af Amer 55 (*)    GFR calc Af Amer 64 (*)    All other components within normal limits  URINALYSIS, ROUTINE W REFLEX MICROSCOPIC - Abnormal; Notable for the following:    APPearance HAZY (*)    Glucose, UA >1000 (*)    Hgb urine dipstick TRACE (*)    Protein, ur  30 (*)    Leukocytes, UA TRACE (*)    All other components within normal limits  URINE MICROSCOPIC-ADD ON - Abnormal; Notable for the following:    Bacteria, UA MANY (*)    All other components within normal limits  CULTURE, BLOOD (ROUTINE X 2)  CULTURE, BLOOD (ROUTINE X 2)  URINE CULTURE  CULTURE, EXPECTORATED SPUTUM-ASSESSMENT  RESPIRATORY VIRUS PANEL  URINE CULTURE  CULTURE, EXPECTORATED SPUTUM-ASSESSMENT  TROPONIN I  POTASSIUM  URINE RAPID DRUG SCREEN (HOSP PERFORMED)  MONONUCLEOSIS SCREEN  PROCALCITONIN  CBC  CREATININE, SERUM  HEMOGLOBIN A1C  URINALYSIS, ROUTINE W REFLEX MICROSCOPIC  BASIC METABOLIC PANEL  CBC  LEGIONELLA ANTIGEN, URINE  STREP PNEUMONIAE URINARY ANTIGEN  CG4 I-STAT (LACTIC ACID)   Imaging Review Dg Chest 2 View  05/09/2013   CLINICAL DATA:  Cough, chills, nausea, body aches  EXAM: CHEST  2 VIEW  COMPARISON:  Chest radiograph 03/20/2012  FINDINGS: Normal cardiac silhouette. There is linear opacity in the right middle lobe slightly increased from prior. No effusion, infiltrate, or pneumothorax.  IMPRESSION: Atelectasis versus pneumonia in the right middle lobe.   Electronically Signed   By: Genevive Bi M.D.   On: 05/09/2013 11:07    EKG Interpretation     Ventricular Rate:    PR Interval:    QRS Duration:   QT Interval:    QTC Calculation:   R Axis:     Text Interpretation:              MDM   1. Sepsis   2. Community acquired pneumonia     Patient presenting to the ED with productive cough, headache, bodyaches, chills, weakness, intermittent chest pain that has been ongoing at least 1-2 times per day sporadically. Patient has history of DM and HTN - poor control of the diabetes that led to BTKA of the left leg.  Alert and oriented. Heart rate noted tachycardia. Rhythm normal. Pulses palpable and strong - radial bilaterally and DP of the RLE. Full ROM to upper and lower extremities. Patient is no sign of respiratory distress. Patient  able to speak in full sentences without difficulty.  EKG mild ST segment elevations noted in the anterior leads, but seen on EKG performed on 03/20/2012 - reviewed EKG and old EKG with attending physician who agreed. Negative elevation in troponins.  CMP noted hyponatremia of 129, low chloride of 94, elevated glucose of 325 - bicarb normal - no DKA noted at this moment. BUN and Cr elevated - BUN 36, Cr 141. Chest xray noted actelectasis versus pneumonia in the right middle lobe. Negative elevation of lactic acid.  Patient febrile while in ED setting with a temperature of 101.16F - tylenol administered. Patient placed on cardiac monitoring and pulse ox. Patient has proper saturation on room air. Patient started on IV antibiotics and fluids for dehydration. Patient tachycardic - heart rate ranging from 118 - 120 bpm. Patient placed on IV antibiotics for CAP.  Patient hyperkalemic - 6.7. Potassium re-ordered. EKG ordered to review. Re-draw of potassium level performed with potassium 4.6. EKG negative T-wave changes noted.  While in ED setting patient developed a fever and started to become hypotensive with blood pressure dropping to 92/52 - this provider was notified. Another IV was started on the patient with 2 L of IV fluids going at the same time, patient was sat up in the bed. Blood pressure improved. Blood cultures and sepsis protocol started.  This provider spoke with Dr. Regina Eck from Internal Medicine Teaching Services - patient to be admitted to the hospital as inpatient for Sepsis and CAP. Patient has risk factors such as poorly controlled DM - leaving the patient with a BTKA of the LLE. Patient to be admitted to the hospital. Discussed plan for admission with patient - patient understood and agreed to plan. Patient's blood pressure in ED setting - patient stable for transfer.   Raymon Mutton, PA-C 05/09/13 2009

## 2013-05-09 NOTE — ED Notes (Signed)
2nd ekg given to Dr. Jodi Mourning.

## 2013-05-09 NOTE — Progress Notes (Signed)
ANTIBIOTIC CONSULT NOTE - INITIAL  Pharmacy Consult:  Vancomycin Indication:  Suspected PNA  Allergies  Allergen Reactions  . Penicillins Other (See Comments)    Since birth    Patient Measurements: Height: 5\' 11"  (180.3 cm) Weight: 215 lb 13.3 oz (97.9 kg) IBW/kg (Calculated) : 75.3  Vital Signs: Temp: 97.7 F (36.5 C) (11/05 1633) Temp src: Oral (11/05 1633) BP: 124/77 mmHg (11/05 1633) Pulse Rate: 110 (11/05 1633)  Labs:  Recent Labs  05/09/13 1135  WBC 17.4*  HGB 12.8*  PLT 206  CREATININE 1.41*   Estimated Creatinine Clearance: 72.2 ml/min (by C-G formula based on Cr of 1.41). No results found for this basename: VANCOTROUGH, VANCOPEAK, VANCORANDOM, GENTTROUGH, GENTPEAK, GENTRANDOM, TOBRATROUGH, TOBRAPEAK, TOBRARND, AMIKACINPEAK, AMIKACINTROU, AMIKACIN,  in the last 72 hours   Microbiology: No results found for this or any previous visit (from the past 720 hour(s)).  Medical History: Past Medical History  Diagnosis Date  . Type 1 diabetes mellitus, uncontrolled   . Hypertension   . Renal disorder   . PVD (peripheral vascular disease)   . S/p cadaver renal transplant   . Cold intolerance   . 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 GERD (gastroesophageal reflux disease)   . 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 Hypertriglyceridemia   . Diabetic retinopathy   . Sleep apnea     in the past .  Was cleared in 2004 to not wear CPap.  Dr in [place]       Assessment: 1 YOM with history of right renal transplant on immunosuppressants to continue vancomycin and levofloxacin for sepsis due to PNA.  Noted patient received levofloxacin 750mg  IV x 1 around 12 noon and vancomycin 1500mg  IV x 1 around 1400 today.  Baseline labs reviewed.  Vanc 11/5 >> LVQ 11/5 >>   Goal of Therapy:  Vancomycin trough level 15-20 mcg/ml   Plan:  - Vanc 1250mg  IV Q12H, start tomorrow - LVQ 750mg  IV Q24H as ordered - Monitor renal fxn, clinical course, vanc trough at  Css    Dhanvin Szeto D. Laney Potash, PharmD, BCPS Pager:  [phone] 05/09/2013, 7:35 PM

## 2013-05-09 NOTE — H&P (Signed)
Date: 05/09/2013               Patient Name:  Trevor Thompson MRN: [id number]  DOB: 05/07/1961 Age / Sex: 52 y.o., male   PCP: Lauris Poag, MD         Medical Service: Internal Medicine Teaching Service         Attending Physician: Dr. Burns Spain, MD    First Contact: Dr. Johna Roles Pager: [phone]  Second Contact: Dr. Burtis Junes  Pager: [phone]       After Hours (After 5p/  First Contact Pager: [phone]  weekends / holidays): Second Contact Pager: [phone]   Chief Complaint: cough, fever, chills, nausea, weakness x 3 days  History of Present Illness:   Tayton Decaire is a 52 year old with HTN, HL, complicated Type I DM, PVD s/p left BKA 2013, right renal transplant in 2004 on immunosuppressives, who presents with productive cough, fever, nausea, and weakness of 3 day duration. Patient reports that he was in his normal state of health until Sunday night when he began having a productive cough, subjective fever, chills, headache, nausea without emesis or abdominal pain, night sweating, weakness, body aches, decreased PO intake, and lightheadedness. He also reports elevated glucose levels at home (400-500s) with symptoms of polyuria and polydipsia. He has been unable to take his insulin (Lantus 32U BID & 3 novolog TID) for the past  2 months do to inability to afford it.  He denied dyspnea, CP, swelling,  rhinorrhea, sore throat, tender nodes, or weight loss. He also reports constipation (last BM yesterday). He has not pneumococcal or influenza vaccine this year. He denies sick contacts or recent travel.   He has had normal urination with no hematuria, tenderness at the site of his right kidney transplant, or flank pain. He had a right kidney transplant from cadaver in 2004 (original right kidney left in) in [place] at [facility] in 2004 and off of HD for 10 years. 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 He reports one infection since (involving chest)  then and no malignancy.  He is on prednisone (5 mg), mycophenolate (360mg  BIG),  and tacrolimus (4mg  BID). He follows with Dr. Casimiro Needle of  [facility]. He currently does not have a PCP.    Meds: Current Facility-Administered Medications  Medication Dose Route Frequency Provider Last Rate Last Dose  . vancomycin (VANCOCIN) 1,500 mg in sodium chloride 0.9 % 500 mL IVPB  1,500 mg Intravenous Once Judie Bonus Hammons, RPH 250 mL/hr at 05/09/13 1359 1,500 mg at 05/09/13 1359   Current Outpatient Prescriptions  Medication Sig Dispense Refill  . lisinopril (PRINIVIL,ZESTRIL) 10 MG tablet Take 10 mg by mouth daily.      . metoprolol tartrate (LOPRESSOR) 25 MG tablet Take 25 mg by mouth 2 (two) times daily.      . mycophenolate (MYFORTIC) 360 MG TBEC Take 360 mg by mouth 2 (two) times daily.       . predniSONE (DELTASONE) 5 MG tablet Take 5 mg by mouth daily.      . tacrolimus (PROGRAF) 1 MG capsule Take 4 mg by mouth 2 (two) times daily.      . insulin aspart (NOVOLOG) 100 UNIT/ML injection Inject 3 Units into the skin 3 (three) times daily before meals.  1 vial  0  . insulin glargine (LANTUS) 100 UNIT/ML injection Inject 32 Units into the skin 2 (two) times daily.  Allergies: Allergies as of 05/09/2013 - Review Complete 05/09/2013  Allergen Reaction Noted  . Penicillins Other (See Comments) 11/23/2011   Past Medical History  Diagnosis Date  . Type 1 diabetes mellitus, uncontrolled   . Hypertension   . Renal disorder   . PVD (peripheral vascular disease)   . S/p cadaver renal transplant   . Cold intolerance   . 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 GERD (gastroesophageal reflux disease)   . 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 Hypertriglyceridemia   . Diabetic retinopathy   . Sleep apnea     in the past .  Was cleared in 2004 to not wear CPap.  Dr in [facility]   Past Surgical History  Procedure Laterality Date  . Kidney transplant  2004    right   . Cataract extraction w/ intraocular lens  implant, bilateral  ~ 1997  . Av fistula placement  1997-2004    "I've got 3; right upper arm; right forearm; left forearm "  . Av fistula repair  1997-2004    "multiple"  . Amputation  02/25/2012    Procedure: AMPUTATION DIGIT;  Surgeon: Larina Earthly, MD;  Location: [facility] OR;  Service: Vascular;  Laterality: Left;  Left 2nd Toe Amputation, Incision and Drainage left lateral foot.  . Amputation  03/22/2012    Procedure: AMPUTATION BELOW KNEE;  Surgeon: Nadara Mustard, MD;  Location: [facility] OR;  Service: Orthopedics;  Laterality: Left;  Left Below Knee Amputation  . Eye surgery      cataract bil  . Amputation  04/21/2012    Procedure: AMPUTATION BELOW KNEE;  Surgeon: Nadara Mustard, MD;  Location: [facility] OR;  Service: Orthopedics;  Laterality: Left;  Left below knee amputation revision   Family History  Problem Relation Age of Onset  . Diabetes Mother   . Diabetes Father   . Lupus Sister    History   Social History  . Marital Status: Married    Spouse Name: N/A    Number of Children: N/A  . Years of Education: N/A   Occupational History  . Not on file.   Social History Main Topics  . Smoking status: Former Smoker -- 0.30 packs/day for 5 years    Types: Cigarettes    Quit date: 07/05/1981  . Smokeless tobacco: Never Used  . Alcohol Use: No  . Drug Use: No     Comment: 02/21/2012 "smoked pot in the 1970's"  . Sexual Activity: Yes   Other Topics Concern  . Not on file   Social History Narrative   Pt is married and traveling thru [place]    Review of Systems: Review of Systems  Constitutional: Positive for fever, chills, malaise/fatigue and diaphoresis (night sweating). Negative for weight loss.  HENT: Negative for congestion, ear pain and sore throat.   Eyes: Negative for blurred vision.  Respiratory: Positive for cough (productive) and sputum production. Negative for shortness of breath and wheezing.   Cardiovascular: Negative for  chest pain, palpitations and leg swelling.  Gastrointestinal: Positive for nausea and constipation (resolved). Negative for vomiting, abdominal pain, diarrhea and blood in stool.  Genitourinary: Negative for dysuria, urgency, frequency, hematuria and flank pain.  Musculoskeletal: Positive for myalgias. Negative for joint pain.  Skin: Negative for rash.  Neurological: Positive for dizziness, weakness and headaches.  Endo/Heme/Allergies: Positive for polydipsia.    Physical Exam: Blood pressure 109/76, pulse 114, temperature 98.8 F (37.1 C), temperature source Oral, resp. rate 30, SpO2 100.00%. Physical  Exam  Constitutional: He is oriented to person, place, and time. He appears well-developed and well-nourished. No distress.  HENT:  Head: Normocephalic and atraumatic.  Right Ear: External ear normal.  Left Ear: External ear normal.  Nose: Nose normal.  Mouth/Throat: Oropharynx is clear and moist. No oropharyngeal exudate.  Eyes: Conjunctivae and EOM are normal. Pupils are equal, round, and reactive to light. Right eye exhibits no discharge. Left eye exhibits no discharge.  Neck: Normal range of motion. Neck supple.  Cardiovascular: Regular rhythm and normal heart sounds.   No murmur heard. tachycardic  Pulmonary/Chest: Effort normal. No respiratory distress. He has no wheezes. He has no rales. He exhibits no tenderness.  Mildly decreased breath sounds at bases  Abdominal: Soft. Bowel sounds are normal. He exhibits no distension. There is no tenderness. There is no rebound and no guarding.  Scar RLQ   Musculoskeletal: Normal range of motion.  Neurological: He is alert and oriented to person, place, and time.  Skin: Skin is warm and dry. No rash noted. He is not diaphoretic. No erythema. No pallor.  Psychiatric: He has a normal mood and affect. His behavior is normal.    Lab results: Basic Metabolic Panel:  Recent Labs  16/10/96 1135 05/09/13 1200  NA 129*  --   K 6.7* 4.6   CL 94*  --   CO2 26  --   GLUCOSE 325*  --   BUN 36*  --   CREATININE 1.41*  --   CALCIUM 9.6  --    Liver Function Tests:  Recent Labs  05/09/13 1135  AST 44*  ALT 18  ALKPHOS 72  BILITOT 0.6  PROT 8.1  ALBUMIN 3.0*   No results found for this basename: LIPASE, AMYLASE,  in the last 72 hours No results found for this basename: AMMONIA,  in the last 72 hours CBC:  Recent Labs  05/09/13 1135  WBC 17.4*  NEUTROABS 12.6*  HGB 12.8*  HCT 38.6*  MCV 84.6  PLT 206   Cardiac Enzymes:  Recent Labs  05/09/13 1135  TROPONINI <0.30   BNP: No results found for this basename: PROBNP,  in the last 72 hours D-Dimer: No results found for this basename: DDIMER,  in the last 72 hours CBG: No results found for this basename: GLUCAP,  in the last 72 hours Hemoglobin A1C: No results found for this basename: HGBA1C,  in the last 72 hours Fasting Lipid Panel: No results found for this basename: CHOL, HDL, LDLCALC, TRIG, CHOLHDL, LDLDIRECT,  in the last 72 hours Thyroid Function Tests: No results found for this basename: TSH, T4TOTAL, FREET4, T3FREE, THYROIDAB,  in the last 72 hours Anemia Panel: No results found for this basename: VITAMINB12, FOLATE, FERRITIN, TIBC, IRON, RETICCTPCT,  in the last 72 hours Coagulation: No results found for this basename: LABPROT, INR,  in the last 72 hours Urine Drug Screen: Drugs of Abuse  No results found for this basename: labopia, cocainscrnur, labbenz, amphetmu, thcu, labbarb    Alcohol Level: No results found for this basename: ETH,  in the last 72 hours Urinalysis: No results found for this basename: COLORURINE, APPERANCEUR, LABSPEC, PHURINE, GLUCOSEU, HGBUR, BILIRUBINUR, KETONESUR, PROTEINUR, UROBILINOGEN, NITRITE, LEUKOCYTESUR,  in the last 72 hours  Imaging results:  Dg Chest 2 View  05/09/2013   CLINICAL DATA:  Cough, chills, nausea, body aches  EXAM: CHEST  2 VIEW  COMPARISON:  Chest radiograph 03/20/2012  FINDINGS: Normal  cardiac silhouette. There is linear opacity in the right  middle lobe slightly increased from prior. No effusion, infiltrate, or pneumothorax.  IMPRESSION: Atelectasis versus pneumonia in the right middle lobe.   Electronically Signed   By: Genevive Bi M.D.   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   Plan:  Sepsis due to Community Acquired Pneumonia  - Patient with +4 SIRS criteria (HR: 121, RR: 30, T:101.8, WBC 17.4K)  with source of infection thought to be due to pneumonia in setting of immunosuppression. CXR with possible pneumonia in the right middle lobe. Pneumonia Severity Index Class 5 with 27-29.2% mortality. Patient became hypotensive with BP 70/40 that was responsive to fluid bolus adminstration. No signs of end organ damage (Cr elevated, however not AKI). Troponin negative and 12-lead EKG  without cardiac ischemic changes. He became hypoxic (88%) on RA requiring oxygen supplementaton (2L via ).  Lactic acid was within normal levels.  No urinary symptoms or pain at renal transplant site. Patient received IV levofloxacin, IV vancomycin,  and IV solumedrol in  the ED. Patient has PCN allergy.   -Obtain blood cultures x 2  -Obtain Strep pneum, [place] Urine Ag -Obtain sputum culutre -Obtain procalcitonin -Obtain respiratory virus panel  -Obtain monospot test (atypical lymphocytes)  -Obtain UA w/culture  -Obtain UDS -Continue IV vancomycin & levofloxacin -Monitor  CBC  Hypovolemic Hyponatremia  - Patient with Na of 129 on admission and normal serum osmolarity (288) with hypotension that responded to fluid challenge. Most likely hypovolemia in setting of decreased PO intake x 3 days.  -NS bolus w/ maintenance fluid  Insulin dependent Type 2 Diabetes Mellitus  - complicated by retinopathy, nephropathy, and neuropathy. Last HbA1c 11.2 on 02/21/12. Patient with glucose 325 on admission with no AG. Patient reports he has been unable to obtain insulin (Lantus 32U BID & Novolog 3U TID) for the past 2 months and recent home glucose levels of 400-500s.  -Insulin sliding scale -Glucose monitoring x 4 daily   Chronic Immunosuppression s/p Renal Transplant -  Patient with history of right kidney transplant from cadaver in 2004 (original right kidney left in) in [place] at [facility] in 2004 and off of HD for 10 years. 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 He reports one infection since (involving chest) and no malignancy. He is on  prednisone (5 mg), mycophenolate (360mg  BIG), and tacrolimus (4mg  BID). He follows with Dr. Casimiro Needle of  [facility] -Continue home prednisone -Continue home mycophenolate -Continue home tacrolimus   CKD Stage II  - Patient with history of ESRD on HD, last HD 10 yrs ago. He is s/p right kidney transplant in 2004. Basline Cr 1.21 (1 yr ago), currently at 1.41 most likely due to pre-renal  azotemia (hypovolemia due to decreased PO intake) vs intrinsic renal (ATN due to hypotension).     -Obtain UA w/culture -Monitor BMP   Hypertension - Currently normotensive. Patient  reports compliance with lisinopril (10mg  daily) at home. Also on metoprolol tartrate 25 mg BID.  -Hold antihypertensives in setting of hypotension & sepsis  Normocytic anemia - Most likely due to CKD with stable Hg above baseline of 11. No history of blood transfusion. No recent bleeding.    -Monitor CBC   DVT PPx: Heparin SQ TID  Code: Full   Dispo: Disposition is deferred at this time, awaiting improvement of current medical problems. Anticipated discharge in approximately 3-4 day(s).   The patient does have a current PCP Lauris Poag, MD) and does need an [facility] hospital follow-up appointment after discharge.  The patient does have transportation limitations that hinder transportation to clinic appointments.  Signed: Otis Brace, MD 05/09/2013, 2:14 PM

## 2013-05-09 NOTE — Progress Notes (Signed)
Noted order for sputum culture.  RN already gave sterile sputum cup.  Pt aware if he coughs up sputum into cup, to let RN know so it can be sent to the lab.

## 2013-05-09 NOTE — ED Notes (Signed)
PA and MD made aware of pt's BP. Continuing to monitor, boluses given. Pt reports dizziness and headache, no other symptoms.

## 2013-05-09 NOTE — ED Notes (Signed)
Called main lab about bld work, report they have received the bld and working on it now.

## 2013-05-09 NOTE — ED Notes (Signed)
Pt states symptoms x 3 days. Pt states he has had a cough, body aches, night sweat, and headache x 3 days. Pt states feels weak, lightheaded and dizzy. Pt denies v/d. Pt states he does not know what color his mucus is. Pt alert and mentating appropriately. Pt speaking in clear complete sentences.

## 2013-05-09 NOTE — ED Notes (Signed)
Results of lactic acid shown to Dr. Jodi Mourning

## 2013-05-10 ENCOUNTER — Other Ambulatory Visit: Payer: Self-pay

## 2013-05-10 LAB — RESPIRATORY VIRUS PANEL
Influenza A H1: NOT DETECTED
Influenza A: NOT DETECTED
Influenza B: NOT DETECTED
Metapneumovirus: NOT DETECTED
Parainfluenza 1: NOT DETECTED
Parainfluenza 2: NOT DETECTED
Parainfluenza 3: NOT DETECTED
Respiratory Syncytial Virus B: NOT DETECTED

## 2013-05-10 LAB — CBC
HCT: 38.2 % — ABNORMAL LOW (ref 39.0–52.0)
Hemoglobin: 12.4 g/dL — ABNORMAL LOW (ref 13.0–17.0)
MCH: 27.8 pg (ref 26.0–34.0)
MCHC: 32.5 g/dL (ref 30.0–36.0)
RDW: 13.6 % (ref 11.5–15.5)

## 2013-05-10 LAB — BASIC METABOLIC PANEL
BUN: 34 mg/dL — ABNORMAL HIGH (ref 6–23)
Creatinine, Ser: 1.29 mg/dL (ref 0.50–1.35)
GFR calc Af Amer: 72 mL/min — ABNORMAL LOW (ref >90)
GFR calc non Af Amer: 62 mL/min — ABNORMAL LOW (ref >90)
Glucose, Bld: 444 mg/dL — ABNORMAL HIGH (ref 70–99)
Potassium: 5 mEq/L (ref 3.5–5.1)
Sodium: 132 mEq/L — ABNORMAL LOW (ref 135–145)

## 2013-05-10 LAB — GLUCOSE, CAPILLARY
Glucose-Capillary: 281 mg/dL — ABNORMAL HIGH (ref 70–99)
Glucose-Capillary: 333 mg/dL — ABNORMAL HIGH (ref 70–99)
Glucose-Capillary: 397 mg/dL — ABNORMAL HIGH (ref 70–99)
Glucose-Capillary: 276 mg/dL — ABNORMAL HIGH (ref 70–99)
Glucose-Capillary: 332 mg/dL — ABNORMAL HIGH (ref 70–99)

## 2013-05-10 LAB — URINE CULTURE

## 2013-05-10 LAB — HEMOGLOBIN A1C: Hgb A1c MFr Bld: 13.7 % — ABNORMAL HIGH (ref <5.7)

## 2013-05-10 MED ORDER — INSULIN GLARGINE 100 UNIT/ML ~~LOC~~ SOLN
15.0000 [IU] | Freq: Two times a day (BID) | SUBCUTANEOUS | Status: DC
  Administered 2013-05-10 – 2013-05-11 (×3): 15 [IU] via SUBCUTANEOUS
  Filled 2013-05-10 (×4): qty 0.15

## 2013-05-10 NOTE — Progress Notes (Signed)
Subjective:  Pt seen and examined in AM. Patient with nocturnal hypoxia. He states he has OSA but does not wear CPAP at home. Pt reports he is much better today. He denies cough, fever, chills, dyspnea, or CP. His weakness and fatigue has also improved.    Objective: Vital signs in last 24 hours: Filed Vitals:   05/10/13 0007 05/10/13 0428 05/10/13 0755 05/10/13 1216  BP: 137/69 145/82 106/69 95/75  Pulse: 91 100 97 96  Temp: 98.8 F (37.1 C) 97.8 F (36.6 C) 97.6 F (36.4 C) 98 F (36.7 C)  TempSrc: Oral Oral Oral Oral  Resp: 17 17 18 20   Height:      Weight:  97.705 kg (215 lb 6.4 oz)    SpO2: 96% 98% 93% 100%   Weight change:   Intake/Output Summary (Last 24 hours) at 05/10/13 1702 Last data filed at 05/10/13 1600  Gross per 24 hour  Intake 2206.33 ml  Output   2650 ml  Net -443.67 ml   Constitutional: He is oriented to person, place, and time. He appears well-developed and well-nourished. No distress.  Head: Normocephalic and atraumatic.  Right Ear: External ear normal.  Left Ear: External ear normal.  Nose: Nose normal.  Mouth/Throat: Oropharynx is clear and moist. No oropharyngeal exudate.  Eyes: Conjunctivae and EOM are normal  Neck: Normal range of motion. Neck supple.  Cardiovascular: Regular rate and  rhythm and normal heart sounds.  Pulmonary/Chest: Effort normal. No respiratory distress. He has no wheezes. He has no rales. He exhibits no tenderness.  Abdominal: Soft. Bowel sounds are normal. He exhibits no distension. There is no tenderness. There is no rebound and no guarding. Scar RLQ  Musculoskeletal: Normal range of motion.  Neurological: He is alert and oriented to person, place, and time.  Skin: Skin is warm and dry. No rash noted. He is not diaphoretic. No erythema. No pallor.  Psychiatric: He has a normal mood and affect. His behavior is normal.    Lab Results: Basic Metabolic Panel:  Recent Labs Lab 05/09/13 1135 05/09/13 1200  05/09/13 1940 05/10/13 0415  NA 129*  --   --  132*  K 6.7* 4.6  --  5.0  CL 94*  --   --  96  CO2 26  --   --  19  GLUCOSE 325*  --   --  444*  BUN 36*  --   --  34*  CREATININE 1.41*  --  1.28 1.29  CALCIUM 9.6  --   --  9.2   Liver Function Tests:  Recent Labs Lab 05/09/13 1135  AST 44*  ALT 18  ALKPHOS 72  BILITOT 0.6  PROT 8.1  ALBUMIN 3.0*   No results found for this basename: LIPASE, AMYLASE,  in the last 168 hours No results found for this basename: AMMONIA,  in the last 168 hours CBC:  Recent Labs Lab 05/09/13 1135 05/09/13 1940 05/10/13 0415  WBC 17.4* 18.6* 17.5*  NEUTROABS 12.6*  --   --   HGB 12.8* 12.3* 12.4*  HCT 38.6* 38.0* 38.2*  MCV 84.6 85.2 85.7  PLT 206 150 150   Cardiac Enzymes:  Recent Labs Lab 05/09/13 1135  TROPONINI <0.30   BNP: No results found for this basename: PROBNP,  in the last 168 hours D-Dimer: No results found for this basename: DDIMER,  in the last 168 hours CBG:  Recent Labs Lab 05/09/13 1624 05/10/13 0607 05/10/13 0758 05/10/13 1214  GLUCAP 281* 409*  333* 397*   Hemoglobin A1C:  Recent Labs Lab 05/09/13 1940  HGBA1C 13.7*   Fasting Lipid Panel: No results found for this basename: CHOL, HDL, LDLCALC, TRIG, CHOLHDL, LDLDIRECT,  in the last 168 hours Thyroid Function Tests: No results found for this basename: TSH, T4TOTAL, FREET4, T3FREE, THYROIDAB,  in the last 168 hours Coagulation: No results found for this basename: LABPROT, INR,  in the last 168 hours Anemia Panel: No results found for this basename: VITAMINB12, FOLATE, FERRITIN, TIBC, IRON, RETICCTPCT,  in the last 168 hours Urine Drug Screen: Drugs of Abuse     Component Value Date/Time   LABOPIA NONE DETECTED 05/09/2013 1545   COCAINSCRNUR NONE DETECTED 05/09/2013 1545   LABBENZ NONE DETECTED 05/09/2013 1545   AMPHETMU NONE DETECTED 05/09/2013 1545   THCU NONE DETECTED 05/09/2013 1545   LABBARB NONE DETECTED 05/09/2013 1545    Alcohol  Level: No results found for this basename: ETH,  in the last 168 hours Urinalysis:  Recent Labs Lab 05/09/13 1545  COLORURINE YELLOW  LABSPEC 1.017  PHURINE 5.0  GLUCOSEU >1000*  HGBUR TRACE*  BILIRUBINUR NEGATIVE  KETONESUR NEGATIVE  PROTEINUR 30*  UROBILINOGEN 0.2  NITRITE NEGATIVE  LEUKOCYTESUR TRACE*     Micro Results: Recent Results (from the past 240 hour(s))  CULTURE, BLOOD (ROUTINE X 2)     Status: None   Collection Time    05/09/13  1:38 PM      Result Value Range Status   Specimen Description BLOOD HAND RIGHT   Final   Special Requests BOTTLES DRAWN AEROBIC AND ANAEROBIC 10CC   Final   Culture  Setup Time     Final   Value: 05/09/2013 20:28     Performed at [facility]   Culture     Final   Value:        BLOOD CULTURE RECEIVED NO GROWTH TO DATE CULTURE WILL BE HELD FOR 5 DAYS BEFORE ISSUING A FINAL NEGATIVE REPORT     Performed at [facility]   Report Status PENDING   Incomplete  CULTURE, BLOOD (ROUTINE X 2)     Status: None   Collection Time    05/09/13  1:40 PM      Result Value Range Status   Specimen Description BLOOD HAND LEFT   Final   Special Requests BOTTLES DRAWN AEROBIC AND ANAEROBIC 10CC   Final   Culture  Setup Time     Final   Value: 05/09/2013 20:27     Performed at [facility]   Culture     Final   Value:        BLOOD CULTURE RECEIVED NO GROWTH TO DATE CULTURE WILL BE HELD FOR 5 DAYS BEFORE ISSUING A FINAL NEGATIVE REPORT     Performed at [facility]   Report Status PENDING   Incomplete  URINE CULTURE     Status: None   Collection Time    05/09/13  3:45 PM      Result Value Range Status   Specimen Description URINE, RANDOM   Final   Special Requests levofloxacin, vancomycin Immunocompromised   Final   Culture  Setup Time     Final   Value: 05/09/2013 17:53     Performed at [facility] Count     Final   Value: 9,000 COLONIES/ML     Performed at [facility]   Culture      Final   Value: INSIGNIFICANT GROWTH  Performed at [facility]   Report Status 05/10/2013 FINAL   Final  MRSA PCR SCREENING     Status: None   Collection Time    05/09/13  7:44 PM      Result Value Range Status   MRSA by PCR NEGATIVE  NEGATIVE Final   Comment:            The GeneXpert MRSA Assay (FDA     approved for NASAL specimens     only), is one component of a     comprehensive MRSA colonization     surveillance program. It is not     intended to diagnose MRSA     infection nor to guide or     monitor treatment for     MRSA infections.   Studies/Results: Dg Chest 2 View  05/09/2013   CLINICAL DATA:  Cough, chills, nausea, body aches  EXAM: CHEST  2 VIEW  COMPARISON:  Chest radiograph 03/20/2012  FINDINGS: Normal cardiac silhouette. There is linear opacity in the right middle lobe slightly increased from prior. No effusion, infiltrate, or pneumothorax.  IMPRESSION: Atelectasis versus pneumonia in the right middle lobe.   Electronically Signed   By: Genevive Bi M.D.   On: 05/09/2013 11:07   Medications: I have reviewed the patient's current medications. Scheduled Meds: . heparin  5,000 Units Subcutaneous Q8H  . influenza vac split quadrivalent PF  0.5 mL Intramuscular Tomorrow-1000  . insulin aspart  0-15 Units Subcutaneous TID WC  . insulin glargine  15 Units Subcutaneous BID  . levofloxacin (LEVAQUIN) IV  750 mg Intravenous Q24H  . mycophenolate  360 mg Oral QHS  . mycophenolate  720 mg Oral q morning - 10a  . predniSONE  5 mg Oral Daily  . sodium chloride  3 mL Intravenous Q12H  . tacrolimus  4 mg Oral BID  . vancomycin  1,250 mg Intravenous Q12H   Continuous Infusions: . 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  Plan:   Sepsis due to Community Acquired Pneumonia - improving. Patient presented with +4 SIRS criteria (HR: 121, RR: 30, T:101.8, WBC 17.4K) with source of infection thought to be due to pneumonia in setting of immunosuppression. CXR with possible pneumonia in the right middle lobe. Pneumonia Severity Index Class 5 with 27-29.2% mortality. Patient became hypotensive with BP 70/40 that was responsive to fluid bolus adminstration. No signs of end organ damage (Cr elevated, however not AKI). Troponin negative and 12-lead EKG without cardiac ischemic changes. He became hypoxic (88%) on RA requiring oxygen supplementaton (2L via [place]). Lactic acid was within normal levels. No urinary symptoms or pain at renal transplant site. Patient received IV levofloxacin, IV vancomycin, and IV solumedrol in the ED. Patient has PCN allergy.  -Obtain blood cultures x 2  -Obtain Strep pneum, [place] Urine Ag  -Obtain HIV Ab  - NR -Obtain sputum culture  -Obtain procalcitonin -  0.25, indicating sepsis not likely  -Obtain respiratory virus panel  -Obtain monospot test (atypical lymphocytes) -negative  -Obtain UA w/culture - glucosuria and proteinuria  -Obtain UDS - negative  -Continue IV vancomycin & levofloxacin  -Monitor CBC   Hypovolemic Hyponatremia - Improving. Patient with Na of 129 on admission and normal serum osmolarity (288) with hypotension that responded to fluid challenge. Most likely hypovolemia in setting of decreased PO intake x 3 days.  -NS bolus w/ maintenance fluid    Insulin dependent Type 2 Diabetes Mellitus - Uncontrolled and complicated by retinopathy, nephropathy, and neuropathy. Last HbA1c 11.2 on 02/21/12. Patient with glucose 325 on admission with no AG. Patient reports he has been unable to obtain insulin (Lantus 32U BID & Novolog 3U TID) for the past 2 months and recent home glucose levels of 400-500s. UA with glucosuria and proteinuria.  -Insulin sliding scale  -Glucose monitoring x 4 daily -Lantus 15 U daily   -Obtain HbA1c - 13.7  Chronic Immunosuppression s/p Renal Transplant - Patient with history of right kidney transplant from cadaver in 2004 (original right kidney left in) in [place] at [facility] in 2004 and off of HD for 10 years. 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 He reports one infection since (involving chest) and no malignancy. He is on prednisone (5 mg), mycophenolate (360mg  BIG), and tacrolimus (4mg  BID). He follows with Dr. Casimiro Needle of [facility]  -Continue home prednisone  -Continue home mycophenolate  -Continue home tacrolimus   CKD Stage II - Patient with history of ESRD on HD, last HD 10 yrs ago. He is s/p right kidney transplant in 2004. Basline Cr 1.21 (1 yr ago), currently at 1.41 most likely due to pre-renal azotemia (hypovolemia due to decreased PO intake) vs intrinsic renal (ATN due to hypotension).  -Obtain UA w/culture  -Monitor BMP   Hypertension - Currently normotensive. Patient reports compliance with lisinopril (10mg  daily) at home. Also on metoprolol tartrate 25 mg BID.  -Hold antihypertensives in setting of hypotension & sepsis   Normocytic anemia - Most likely due to CKD with stable Hg above baseline of 11. No history of blood transfusion. No recent bleeding.  -Monitor CBC   OSA - Patient with nocturnal hypoxia. Does not tolerate CPAP -Monitor SpO2 at night  DVT PPx: Heparin SQ TID  Code: Full       Dispo: Disposition is deferred at  this time, awaiting improvement of current medical problems.  Anticipated discharge in approximately 1 day(s).   The patient does have a current PCP Lauris Poag, MD) and does need an [facility] hospital follow-up appointment after discharge.  The patient does have transportation limitations that hinder transportation to clinic appointments.  .Services Needed at time of discharge: Y = Yes, Blank = No PT:   OT:   RN:   Equipment:   Other:     LOS: 1 day   Otis Brace, MD 05/10/2013, 5:02 PM

## 2013-05-10 NOTE — ED Provider Notes (Signed)
Medical screening examination/treatment/procedure(s) were conducted as a shared visit with non-physician practitioner(s) or resident and myself. I personally evaluated the patient during the encounter and agree with the findings and plan unless otherwise indicated.  I have personally reviewed any xrays and/ or EKG's with the provider and I agree with interpretation.  Complex medical hx, renal transplant, unsure if taking medicines, chronic steroids, DM presents with cough/ dyspnea. Clinically pneumonia, confirmed on Xray. Pt worsened in ED. Sepsis, pneumonia. 2 L fluid boluses, pt improved.  Broad abx. Cultures. Immunosuppression hx. Vanco and levaquin to start, pnc allergy. Rechecked, improved.  Rales bilateral, tachypnea, abd soft/ NT. FM to admit. Steroid bolus for possible adrenal insuff.  Severe sepsis, Pneumonia, Renal transplant hx, ARF, Hyponatremia  Labs Reviewed   CBC WITH DIFFERENTIAL - Abnormal; Notable for the following:    WBC  17.4 (*)     Hemoglobin  12.8 (*)     HCT  38.6 (*)     Lymphocytes Relative  10 (*)     Monocytes Relative  18 (*)     Neutro Abs  12.6 (*)     Monocytes Absolute  3.1 (*)     All other components within normal limits   COMPREHENSIVE METABOLIC PANEL - Abnormal; Notable for the following:    Sodium  129 (*)     Potassium  6.7 (*)     Chloride  94 (*)     Glucose, Bld  325 (*)     BUN  36 (*)     Creatinine, Ser  1.41 (*)     Albumin  3.0 (*)     AST  44 (*)     GFR calc non Af Amer  55 (*)     GFR calc Af Amer  64 (*)     All other components within normal limits   CULTURE, BLOOD (ROUTINE X 2)   CULTURE, BLOOD (ROUTINE X 2)   CULTURE, BLOOD (ROUTINE X 2)   CULTURE, BLOOD (ROUTINE X 2)   URINE CULTURE   CULTURE, EXPECTORATED SPUTUM-ASSESSMENT   RESPIRATORY VIRUS PANEL   TROPONIN I   POTASSIUM   URINALYSIS, ROUTINE W REFLEX MICROSCOPIC   URINE RAPID DRUG SCREEN (HOSP PERFORMED)   CG4 I-STAT (LACTIC ACID)      Enid Skeens, MD 05/10/13  1719

## 2013-05-10 NOTE — Progress Notes (Signed)
Nutrition Brief Note  Patient identified on the Malnutrition Screening Tool (MST) Report for recent weight lost without trying and eating poorly because of a decreased appetite.  Per readings below, patient has had 10% weight loss in ~ 1 year; not significant for time frame.  Wt Readings from Last 15 Encounters:  05/10/13 215 lb 6.4 oz (97.705 kg)  04/20/12 240 lb (108.863 kg)  04/18/12 230 lb (104.327 kg)  03/29/12 231 lb 11.3 oz (105.1 kg)  03/20/12 250 lb (113.399 kg)  03/07/12 250 lb (113.399 kg)  02/29/12 253 lb 8.5 oz (115 kg)  02/29/12 253 lb 8.5 oz (115 kg)  02/29/12 253 lb 8.5 oz (115 kg)  11/23/11 255 lb (115.667 kg)    Body mass index is 30.06 kg/(m^2). Patient meets criteria for Obesity Class I based on current BMI.   Current diet order is Carbohydrate Modified Medium Calorie, patient is consuming approximately 100% of meals at this time. Labs and medications reviewed.   No nutrition interventions warranted at this time. If nutrition issues arise, please consult RD.   Maureen Chatters, RD, LDN Pager #: [phone] After-Hours Pager #: [phone]

## 2013-05-10 NOTE — Progress Notes (Addendum)
Spoke with patient about his diabetes.  Was diagnosed in 1989.  Has had a renal transplant in 2004.  Has not taken his insulin in about 2 months due to not being able to afford his insulin.  Sees Dr. Lucianne Muss as his endocrinologist. Patient's HgbA1C is 13.7%.  Patient has been on Lantus 32 units twice a day at home when he was able to get it.   Does not have a PCP.  Case management to see patient regarding medication affordability.  Patient very worried about this situation.  Will continue to follow while in hospital.   Smith Mince RN BSN CDE

## 2013-05-10 NOTE — H&P (Signed)
  Date: 05/10/2013  Patient name: Trevor Thompson  Medical record number: [id number]  Date of birth: 1960/08/05   I have seen and evaluated Gerlene Burdock and discussed their care with the Residency Team.   Assessment and Plan: I have seen and evaluated the patient as outlined above. I agree with the formulated Assessment and Plan as detailed in the residents' admission note, with the following changes:   1. Sepsis 2/2 CAP - due to pt's immunosuppression, pt will be tx with aggressive IV ABX. Hydrate. CX P. Follow slinical response.   2. Nocturnal hypoxia - pt states he has had a sleep study that dx him with OSA and had a mask but cannot tolerate the masks and even refuses O2 by [place].Education.    3. S/P kidney transplant - Creat stable and no indication of rejection. No need to get Tacrolimus level as Cr stable and pt endorses compliance with that med.   Burns Spain, MD 11/6/20144:48 PM

## 2013-05-11 DIAGNOSIS — A419 Sepsis, unspecified organism: Principal | ICD-10-CM

## 2013-05-11 LAB — GLUCOSE, CAPILLARY
Glucose-Capillary: 298 mg/dL — ABNORMAL HIGH (ref 70–99)
Glucose-Capillary: 258 mg/dL — ABNORMAL HIGH (ref 70–99)

## 2013-05-11 LAB — CBC
MCH: 27.3 pg (ref 26.0–34.0)
MCHC: 32.6 g/dL (ref 30.0–36.0)
MCV: 83.6 fL (ref 78.0–100.0)
Platelets: 163 10*3/uL (ref 150–400)
RDW: 13.7 % (ref 11.5–15.5)

## 2013-05-11 LAB — BASIC METABOLIC PANEL
BUN: 33 mg/dL — ABNORMAL HIGH (ref 6–23)
CO2: 23 mEq/L (ref 19–32)
Calcium: 9.1 mg/dL (ref 8.4–10.5)
Creatinine, Ser: 1.12 mg/dL (ref 0.50–1.35)
GFR calc non Af Amer: 73 mL/min — ABNORMAL LOW (ref >90)
Glucose, Bld: 324 mg/dL — ABNORMAL HIGH (ref 70–99)
Sodium: 132 mEq/L — ABNORMAL LOW (ref 135–145)

## 2013-05-11 MED ORDER — INSULIN NPH ISOPHANE & REGULAR (70-30) 100 UNIT/ML ~~LOC~~ SUSP: 20.0000 [IU] | Freq: Two times a day (BID) | SUBCUTANEOUS | Status: DC

## 2013-05-11 MED ORDER — AZITHROMYCIN 500 MG PO TABS: 500.0000 mg | ORAL_TABLET | Freq: Every day | ORAL | Status: DC

## 2013-05-11 MED ORDER — LEVOFLOXACIN 750 MG PO TABS
750.0000 mg | ORAL_TABLET | Freq: Once | ORAL | Status: AC
  Administered 2013-05-11: 750 mg via ORAL
  Filled 2013-05-11: qty 1

## 2013-05-11 NOTE — Progress Notes (Signed)
Subjective:  Pt seen and examined in AM. No acute events overnight. Pt reports feels great and is ready to go home. He was not able to sleep well due to monitor that was beeping. He denies cough, dyspnea, CP, fever, chills, or weakness. He has been eating well and no nausea, vomiting, or abdominal pain.    Objective: Vital signs in last 24 hours: Filed Vitals:   05/11/13 0018 05/11/13 0411 05/11/13 0806 05/11/13 1120  BP: 121/71  120/75 97/55  Pulse: 98 86 89 102  Temp: 98 F (36.7 C) 97.9 F (36.6 C) 97.6 F (36.4 C) 97.5 F (36.4 C)  TempSrc: Oral Oral Oral Oral  Resp: 22 17 17 19   Height:      Weight:      SpO2: 99% 97% 99% 96%   Weight change:   Intake/Output Summary (Last 24 hours) at 05/11/13 1337 Last data filed at 05/11/13 1300  Gross per 24 hour  Intake   2653 ml  Output      0 ml  Net   2653 ml   Constitutional: He is oriented to person, place, and time. He appears well-developed and well-nourished. No distress.  Head: Normocephalic and atraumatic.  Right Ear: External ear normal.  Left Ear: External ear normal.  Nose: Nose normal.  Mouth/Throat: Oropharynx is clear and moist. No oropharyngeal exudate.  Eyes: Conjunctivae and EOM are normal  Neck: Normal range of motion. Neck supple.  Cardiovascular: Regular rate and  rhythm and normal heart sounds.  Pulmonary/Chest: Effort normal. No respiratory distress. He has no wheezes. He has no rales. He exhibits no tenderness.  Abdominal: Soft. Bowel sounds are normal. He exhibits no distension. There is no tenderness. There is no rebound and no guarding. Scar RLQ  Musculoskeletal: Normal range of motion.  Neurological: He is alert and oriented to person, place, and time.  Skin: Skin is warm and dry. No rash noted. He is not diaphoretic. No erythema. No pallor.  Psychiatric: He has a normal mood and affect. His behavior is normal.    Lab Results: Basic Metabolic Panel:  Recent Labs Lab 05/10/13 0415  05/11/13 0805  NA 132* 132*  K 5.0 4.4  CL 96 99  CO2 19 23  GLUCOSE 444* 324*  BUN 34* 33*  CREATININE 1.29 1.12  CALCIUM 9.2 9.1   Liver Function Tests:  Recent Labs Lab 05/09/13 1135  AST 44*  ALT 18  ALKPHOS 72  BILITOT 0.6  PROT 8.1  ALBUMIN 3.0*   No results found for this basename: LIPASE, AMYLASE,  in the last 168 hours No results found for this basename: AMMONIA,  in the last 168 hours CBC:  Recent Labs Lab 05/09/13 1135  05/10/13 0415 05/11/13 0805  WBC 17.4*  < > 17.5* 15.0*  NEUTROABS 12.6*  --   --   --   HGB 12.8*  < > 12.4* 12.1*  HCT 38.6*  < > 38.2* 37.1*  MCV 84.6  < > 85.7 83.6  PLT 206  < > 150 163  < > = values in this interval not displayed. Cardiac Enzymes:  Recent Labs Lab 05/09/13 1135  TROPONINI <0.30   BNP: No results found for this basename: PROBNP,  in the last 168 hours D-Dimer: No results found for this basename: DDIMER,  in the last 168 hours CBG:  Recent Labs Lab 05/10/13 0758 05/10/13 1214 05/10/13 1701 05/10/13 2137 05/11/13 0807 05/11/13 1124  GLUCAP 333* 397* 276* 332* 298* 258*  Hemoglobin A1C:  Recent Labs Lab 05/09/13 1940  HGBA1C 13.7*   Fasting Lipid Panel: No results found for this basename: CHOL, HDL, LDLCALC, TRIG, CHOLHDL, LDLDIRECT,  in the last 168 hours Thyroid Function Tests: No results found for this basename: TSH, T4TOTAL, FREET4, T3FREE, THYROIDAB,  in the last 168 hours Coagulation: No results found for this basename: LABPROT, INR,  in the last 168 hours Anemia Panel: No results found for this basename: VITAMINB12, FOLATE, FERRITIN, TIBC, IRON, RETICCTPCT,  in the last 168 hours Urine Drug Screen: Drugs of Abuse     Component Value Date/Time   LABOPIA NONE DETECTED 05/09/2013 1545   COCAINSCRNUR NONE DETECTED 05/09/2013 1545   LABBENZ NONE DETECTED 05/09/2013 1545   AMPHETMU NONE DETECTED 05/09/2013 1545   THCU NONE DETECTED 05/09/2013 1545   LABBARB NONE DETECTED 05/09/2013 1545     Alcohol Level: No results found for this basename: ETH,  in the last 168 hours Urinalysis:  Recent Labs Lab 05/09/13 1545  COLORURINE YELLOW  LABSPEC 1.017  PHURINE 5.0  GLUCOSEU >1000*  HGBUR TRACE*  BILIRUBINUR NEGATIVE  KETONESUR NEGATIVE  PROTEINUR 30*  UROBILINOGEN 0.2  NITRITE NEGATIVE  LEUKOCYTESUR TRACE*     Micro Results: Recent Results (from the past 240 hour(s))  CULTURE, BLOOD (ROUTINE X 2)     Status: None   Collection Time    05/09/13  1:38 PM      Result Value Range Status   Specimen Description BLOOD HAND RIGHT   Final   Special Requests BOTTLES DRAWN AEROBIC AND ANAEROBIC 10CC   Final   Culture  Setup Time     Final   Value: 05/09/2013 20:28     Performed at [facility]   Culture     Final   Value:        BLOOD CULTURE RECEIVED NO GROWTH TO DATE CULTURE WILL BE HELD FOR 5 DAYS BEFORE ISSUING A FINAL NEGATIVE REPORT     Performed at [facility]   Report Status PENDING   Incomplete  CULTURE, BLOOD (ROUTINE X 2)     Status: None   Collection Time    05/09/13  1:40 PM      Result Value Range Status   Specimen Description BLOOD HAND LEFT   Final   Special Requests BOTTLES DRAWN AEROBIC AND ANAEROBIC 10CC   Final   Culture  Setup Time     Final   Value: 05/09/2013 20:27     Performed at [facility]   Culture     Final   Value:        BLOOD CULTURE RECEIVED NO GROWTH TO DATE CULTURE WILL BE HELD FOR 5 DAYS BEFORE ISSUING A FINAL NEGATIVE REPORT     Performed at [facility]   Report Status PENDING   Incomplete  RESPIRATORY VIRUS PANEL     Status: None   Collection Time    05/09/13  3:37 PM      Result Value Range Status   Source - RVPAN NASAL WASHINGS   Corrected   Comment: CORRECTED ON 11/06 AT 1756: PREVIOUSLY REPORTED AS NASAL WASHINGS   Respiratory Syncytial Virus A NOT DETECTED   Final   Respiratory Syncytial Virus B NOT DETECTED   Final   Influenza A NOT DETECTED   Final   Influenza B NOT DETECTED   Final    Parainfluenza 1 NOT DETECTED   Final   Parainfluenza 2 NOT DETECTED   Final  Parainfluenza 3 NOT DETECTED   Final   Metapneumovirus NOT DETECTED   Final   Rhinovirus NOT DETECTED   Final   Adenovirus NOT DETECTED   Final   Influenza A H1 NOT DETECTED   Final   Influenza A H3 NOT DETECTED   Final   Comment: (NOTE)           Normal Reference Range for each Analyte: NOT DETECTED     Testing performed using the Luminex xTAG Respiratory Viral Panel test     kit.     This test was developed and its performance characteristics determined     by [facility]. It has not been cleared or approved by the [place]     Food and Drug Administration. This test is used for clinical purposes.     It should not be regarded as investigational or for research. This     laboratory is certified under the Clinical Laboratory Improvement     Amendments of 1988 (CLIA) as qualified to perform high complexity     clinical laboratory testing.     Performed at [facility]  URINE CULTURE     Status: None   Collection Time    05/09/13  3:45 PM      Result Value Range Status   Specimen Description URINE, RANDOM   Final   Special Requests levofloxacin, vancomycin Immunocompromised   Final   Culture  Setup Time     Final   Value: 05/09/2013 17:53     Performed at [facility] Count     Final   Value: 9,000 COLONIES/ML     Performed at [facility]   Culture     Final   Value: INSIGNIFICANT GROWTH     Performed at [facility]   Report Status 05/10/2013 FINAL   Final  MRSA PCR SCREENING     Status: None   Collection Time    05/09/13  7:44 PM      Result Value Range Status   MRSA by PCR NEGATIVE  NEGATIVE Final   Comment:            The GeneXpert MRSA Assay (FDA     approved for NASAL specimens     only), is one component of a     comprehensive MRSA colonization     surveillance program. It is not     intended to diagnose MRSA     infection nor to guide or      monitor treatment for     MRSA infections.   Studies/Results: No results found. Medications: I have reviewed the patient's current medications. Scheduled Meds: . heparin  5,000 Units Subcutaneous Q8H  . insulin aspart  0-15 Units Subcutaneous TID WC  . insulin glargine  15 Units Subcutaneous BID  . levofloxacin (LEVAQUIN) IV  750 mg Intravenous Q24H  . levofloxacin  750 mg Oral Once  . mycophenolate  360 mg Oral QHS  . mycophenolate  720 mg Oral q morning - 10a  . predniSONE  5 mg Oral Daily  . sodium chloride  3 mL Intravenous Q12H  . tacrolimus  4 mg Oral BID  . vancomycin  1,250 mg Intravenous Q12H   Continuous Infusions: . 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  Plan:   Sepsis due to Community Acquired Pneumonia - improving. Patient presented with +4 SIRS criteria (HR: 121, RR: 30, T:101.8, WBC 17.4K) with source of infection thought to be due to pneumonia in setting of immunosuppression. CXR with possible pneumonia in the right middle lobe. Pneumonia Severity Index Class 5 with 27-29.2% mortality. Patient became hypotensive with BP 70/40 that was responsive to fluid bolus adminstration. No signs of end organ damage (Cr elevated, however not AKI). Troponin negative and 12-lead EKG without cardiac ischemic changes. He  became hypoxic (88%) on RA requiring oxygen supplementaton (2L via [place]). Lactic acid was within normal levels. No urinary symptoms or pain at renal transplant site. Patient received IV levofloxacin, IV vancomycin, and IV solumedrol in the ED. Patient has PCN allergy.  -Obtain blood cultures x 2 --> NGTD -Obtain Strep pneum, [place] Urine Ag ---> not obatined  -Obtain HIV Ab  - NR -Obtain sputum culture --> not obtained -Obtain procalcitonin - 0.25, indicating sepsis not likely  -Obtain respiratory virus panel --> NGTD -Obtain monospot test (atypical lymphocytes) -negative  -Obtain UA w/culture - glucosuria and proteinuria  -Obtain UDS - negative  -Continue IV vancomycin & levofloxacin Day 3--> transition to PO levofloxacin  -Monitor CBC   Hypovolemic Hyponatremia - Improving. Patient with Na of 129 on admission and normal serum osmolarity (288) with hypotension that responded to fluid challenge. Most likely hypovolemia in setting of decreased PO intake x 3 days.  -continue to monitor BMP  Insulin dependent Type 2 Diabetes Mellitus - Uncontrolled and complicated by retinopathy, nephropathy, and neuropathy. Last HbA1c 11.2 on 02/21/12. Patient with glucose 325 on admission with no AG. Patient reports he has been unable to obtain insulin (Lantus 32U BID & Novolog 3U TID) for the past 2 months and recent home glucose levels of 400-500s. UA with glucosuria and proteinuria.  -Insulin sliding scale  -Glucose monitoring x 4 daily -Lantus 15 U daily   -Obtain HbA1c - 13.7  Chronic Immunosuppression s/p Renal Transplant - Patient with history of right kidney transplant from cadaver in 2004 (original right kidney left in) in [place] at [facility] in 2004 and off of HD for 10 years. 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 He reports one infection since (involving chest) and no malignancy. He is on prednisone (5 mg), mycophenolate (360mg  BIG), and tacrolimus (4mg   BID). He follows with Dr. Casimiro Needle of [facility]  -Continue home prednisone  -Continue home mycophenolate  -Continue home tacrolimus   CKD Stage II -  Patient with history of ESRD on HD, last HD 10 yrs ago. He is s/p right kidney transplant in 2004. Basline Cr 1.21 (1 yr ago), currently at 1.41 most likely due to pre-renal azotemia (hypovolemia due to decreased PO intake) vs intrinsic renal (ATN due to hypotension).  -Obtain UA w/culture --> insignificant growth, proteinuria, glucosuria, trace blood -Monitor BMP   Hypertension - Currently normotensive. Patient reports compliance with lisinopril (10mg  daily) at home. Also on metoprolol tartrate 25 mg BID.  -Hold antihypertensives in setting of hypotension & sepsis  Normocytic anemia -  Stable Most likely due to CKD with stable Hg above baseline of 11. No history of blood transfusion. No recent bleeding.  -Monitor CBC   OSA - Patient with nocturnal hypoxia. Does not tolerate CPAP -Monitor SpO2 at night  DVT PPx: Heparin SQ TID  Code: Full       Dispo: Disposition is deferred at this time, awaiting improvement of current medical problems.  Anticipated discharge in approximately 1 day(s).   The patient does have a current PCP Lauris Poag, MD) and does need an [facility] hospital follow-up appointment after discharge.  The patient does have transportation limitations that hinder transportation to clinic appointments.  .Services Needed at time of discharge: Y = Yes, Blank = No PT:   OT:   RN:   Equipment:   Other:     LOS: 2 days   Otis Brace, MD 05/11/2013, 1:37 PM

## 2013-05-11 NOTE — Progress Notes (Signed)
CBGs on 11/6:  [phone] mg/dl       91/4: 782 mg/dl Recommend Lantus dosage be increased to 25 units BID and titrate as needed.  Patient takes Lantus 32 units BID at home.  Still remains on Prednisone in the hospital.  Smith Mince RN BSN CDE

## 2013-05-11 NOTE — Progress Notes (Signed)
Patient in a stable condition and being discharged home at this time.  Discharge education performed, patient informed of follow up appointment, and medications prescribed following discharge.   Nicole Cella, RN

## 2013-05-11 NOTE — Progress Notes (Signed)
  Date: 05/11/2013  Patient name: Trevor Thompson  Medical record number: [id number]  Date of birth: 10/02/1960   This patient has been seen and the plan of care was discussed with the house staff. Please see their note for complete details. I concur with their findings with the following additions/corrections: [name] is stable for D/C home on PO ABX. Will need to assess that hte ABX will not interefere with the metabolism of his anti-rejection meds. Pt prefers walk in type clinic so will set up with [facility].   Burns Spain, MD 05/11/2013, 1:23 PM

## 2013-05-11 NOTE — Progress Notes (Signed)
CBGs on 11/6:  [phone] mg/dl       16/1:  096-045 mg/dl Recommend changing insulin to 70/30 mix for affordability.  Recommend starting with 70/30 insulin 25-30 units BID which is approximately 0.6 units/kg.   May need to titrate as needed.  Will need to see Dr. Lucianne Muss at discharge for further medication adjustments. Will need to check blood sugars at home at least twice per day.  Will continue to follow while in hospital.   Smith Mince RN BSN CDE

## 2013-05-11 NOTE — Care Management Note (Signed)
    Page 1 of 1   05/11/2013     3:42:58 PM   CARE MANAGEMENT NOTE 05/11/2013  Patient:  Trevor Thompson, Trevor Thompson   Account Number:  [id number]  Date Initiated:  05/11/2013  Documentation initiated by:  Deovion Batrez  Subjective/Objective Assessment:   adm with DKA; lives with spouse    PCP  Dr Reather Littler     DC Planning Services  CM consult      Per UR Regulation:  Reviewed for med. necessity/level of care/duration of stay  Comments:  05/11/13 1519 Adalida Garver RN MSN BSN CCM Pt will be able to get Relion 70/30 from [place] for $24.88.  05/10/13 1520 Dany Harten RN MSN BSN CCM Pt has Medicare D plan but copay for Lantus is $300 and he has not been able to afford.  Moved here from [place] where he had MCD, does not qualify here per DSS.  CM walked pt and spouse through steps to find a Part D plan for 2015 that will provide better coverage based on his med regimen.

## 2013-05-12 NOTE — Discharge Summary (Signed)
Name: Trevor Thompson MRN: [id number] DOB: Jun 11, 1961 52 y.o. PCP: Lauris Poag, MD  Date of Admission: 05/09/2013  9:21 AM Date of Discharge: 05/11/2013 4:34 PM Attending Physician: Blanch Media MD  Discharge Diagnosis: 1.  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     insulin NPH-regular (70-30) 100 UNIT/ML injection  Commonly known as:  HUMULIN 70/30  Inject 20 Units into the skin 2 (two) times daily with a meal.     lisinopril 10 MG tablet  Commonly known as:  PRINIVIL,ZESTRIL  Take 10 mg by mouth daily.     metoprolol tartrate 25 MG tablet  Commonly known as:  LOPRESSOR  Take 25 mg by mouth 2 (two) times daily.     mycophenolate 360 MG Tbec EC tablet  Commonly known as:  MYFORTIC  Take 360 mg by mouth See admin instructions. Patient reports taking 720mg  in the morning and 360mg  at night     predniSONE 5 MG tablet  Commonly known as:  DELTASONE  Take 5 mg by mouth daily.     tacrolimus 1 MG capsule  Commonly known as:  PROGRAF  Take 4 mg by mouth 2 (two) times daily.        Disposition and follow-up:   Trevor Thompson was discharged from [facility] in Good condition.  At the hospital follow up visit please address:  1.  Resolution of pneumonia       Diabetes control - compliance with insulin and  adjustment of Novolog 70/30      Resolution of transaminitis          Hepatitis B vaccination if not already immune 2.  Labs / imaging needed at time of follow-up: CXR, CBC (WBC), CMP (AST), acute hepatitis panel    3.  Pending labs/ test needing follow-up: none  Follow-up Appointments:     Follow-up Information   Follow up with [facility]     On 05/14/2013. (WALK IN)    Contact information:   [address] [place] [address] [phone]      Discharge Instructions:  -Please go to the wellness center on Monday 05/14/13  -Take Zithromax 500mg  daily for 7 days -Take 20 Units of Insulin 70/30 twice a day  -Check your sugar 4x daily, if your sugar is >250 or <100 over the weekend please call the [facility] and ask for Internal Medicine Teaching Service   Consultations:   none  Procedures Performed:  Dg Chest 2 View  05/09/2013   CLINICAL DATA:  Cough, chills, nausea, body aches  EXAM: CHEST  2 VIEW  COMPARISON:  Chest radiograph 03/20/2012  FINDINGS: Normal cardiac silhouette. There is linear opacity in the right middle lobe slightly increased from prior.  No effusion, infiltrate, or pneumothorax.  IMPRESSION: Atelectasis versus pneumonia in the right middle lobe.   Electronically Signed   By: Genevive Bi M.D.   On: 05/09/2013 11:07    2D Echo: none  Cardiac Cath: none  Admission HPI:   Trevor Thompson is a 52 year old with HTN, HL, complicated Type I DM, PVD s/p left BKA 2013, right renal transplant in 2004 on immunosuppressives, who presents with productive cough, fever, nausea, and weakness of 3 day duration. Patient reports that he was in his normal state of health until Sunday night when he began having a productive cough, subjective fever, chills, headache, nausea without emesis or abdominal pain, night sweating, weakness, body aches, decreased PO intake, and lightheadedness. He also reports elevated glucose levels  at home (400-500s) with symptoms of polyuria and polydipsia. He has been unable to take his insulin (Lantus 32U BID & 3 novolog TID) for the past 2 months do to inability to afford it. He denied dyspnea, CP, swelling, rhinorrhea, sore throat, tender nodes, or weight loss. He also reports constipation (last BM yesterday). He has not pneumococcal or influenza vaccine this year. He denies sick contacts or recent travel.   He has had normal urination with no hematuria, tenderness at the site of his right kidney transplant, or flank pain. He had a right kidney transplant from cadaver in 2004 (original right kidney left in) in [place] at [facility] in 2004 and off of HD for 10 years. 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 He reports one infection since (involving chest) then and no malignancy. He is on prednisone (5 mg), mycophenolate (360mg  BIG), and tacrolimus (4mg  BID). He follows with Dr. Casimiro Needle of [facility]. He currently does not have a PCP.    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 On admission he was found to be septic with +4 SIRS criteria (HR: 121, RR: 30, T:101.8, WBC 17.4K) with source of infection thought to be due to pneumonia in setting of immunosuppression. CXR with possible pneumonia in the right middle lobe. Patient became hypotensive with BP 70/40 in ED that was responsive to fluid bolus adminstration. There was no evidence of  end organ damage to suggest severe sepsis.  Troponin negative and 12-lead EKG did not suggest cardiac ischemia. His oxygen saturations transiently  dropped to 88% on RA requiring oxygen supplementation but later improved to normal no longer requiring oxygen supplementation. He had no urinary symptoms or pain at renal transplant site to suggest infection. Patient received IV levofloxacin, IV vancomycin, and IV solumedrol in the ED. Patient has PCN allergy. Patient was continued on IV vancomycin and levofloxacin for three days. Further workup included blood cultures (NG), urine cultures (insign growth),  UDS (neg), HIV Ab (NR), procalcitonin (low), lactic acid (wnl), monospot test due to atypical lymphocytes (neg, atypical lymps later not reported), and respiratory virus panel (neg). Pt clinically improved significantly during hospitalization and was transitioned to oral levofloxacin on day of discharge. He was instructed to take azithromycin 500 mg for 7  days  for total 10 day course of antibiotics. Per pharmacy there is interaction with immunosuppressants and many antibiotics including azithromycin and levofloxacin but should not be reason for limiting its use. He reported he will be able to afford his antibiotic course.  His SIRS resolved (WBC 15K on discharge) during hospitalization and he was able to ambulate with normal oxygen saturation on room air at time of discharge.    Insulin dependent Type 2 Diabetes Mellitus - Complicated by retinopathy (including cataracts), nephropathy,  neuropathy, PVD s/p left BKA (04/21/12 by Dr. Lajoyce Corners) and uncontrolled with last HbA1c 13.7 on 05/09/13. Patient with glucose of 325 on admission with no AG and evidence of glucosuria (>1000) and proteinuria. Patient reports he has been unable to obtain insulin (Lantus 32U BID & Novolog 3U TID) for the past 2 months because he could not afford it (co-pay Lantus $300). Reported recent home glucose levels were 400-500s.  Patient was placed on insulin sliding scale and then on 15U  Lantus BID while on carbohydrate modified diet. His glucose levels were monitored frequently and CBGs ranged from 258-444 and was 258 at time of discharge. Pt did follow with endocrinologist  Dr. Lucianne Muss but currently without a PCP. Patient was given 2 months worth of free coupons for Novolog 70/30 pen and told that [facility] has affordable option as well. He was instructed to take 20 U BID, monitor his blood sugars x4 daily and call [facility] hospital operator if CBG >250 or <100 over the weekend . He will go to the [facility] on Monday 05/14/13.      Chronic Immunosuppression s/p Renal Transplant - Patient with history of right kidney transplant from cadaver in 2004 (original right kidney left in) in [place] at [facility] in 2004 and off of HD for 10 years. 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 He reports one infection since (involving chest) and no malignancy or rejection. He is on prednisone (5 mg), mycophenolate (360mg  BIG), and tacrolimus (4mg  BID) at home which were continued during hospitalization. His Cr was stable during hospitalization with no tenderness at site of transplant or concern for rejection. Pt stated he was compliant with his immunosuppressants and tacrolimus levels were consequently not obtained. He follows with Dr. Casimiro Needle of [facility].   Hypovolemic Hyponatremia - Patient with Na of 134 on admission corrected for glucose and normal serum osmolarity (288) with hypotension that responded to fluid challenge. Most likely hypovolemia in setting of decreased PO intake x 3 days. Patient's sodium normalized to 136 (corrected for glucose) on day of discharge.    CKD Stage II - Patient with history of ESRD on HD, last HD 10 yrs ago. He is s/p right kidney transplant in 2004. Basline Cr 1.21 (1 yr ago) with Cr on admission elevated 1.41 most likely due to mild pre-renal azotemia (hypovolemia) since resolution with IV fluids to  1.12 at time of discharge.  There was trace hematuria and mild proteinuria that was also present 1 year ago.    Hypertension - Patient presented with hypotension (70/40) that responded to fluid challenge. His blood pressure ranged from 70/40 - 145/82 during hospitalization. Patient reported compliance with lisinopril (10mg  daily) and metoprolol tartrate 25 mg BID. Patient's home antihypertensives were held in setting of hypotension & sepsis.   Hypoalbuminemia - Pt with chronically low albumin that was higher than baseline (2-2.5) during hospitalization. Etiology most likely due to chronic malnutrition as there was no concern for  nephrotic syndrome or liver failure.    Transaminitis  - Pt with elevated AST on admission without past elevations in recent year. Unknown if patient obtained hepatitis testing in the past. He will need to obtain hepatitis viral panel as outpatient and vaccination for Hep B if not already immune. No RUQ pain or jaundice during hospitalization.   Normocytic anemia - Pt with chronic anemia most likely due to CKD with stable Hg above baseline of 11 throughout hospitalization with no need for blood transfusion. There was no reports of bleeding.   Obstructive Sleep Apnea - Pt reported he was diagnosed with OSA after sleep study but cannot tolerate CPAP. Per  records he was cleared in 2004 to not wear it. Pt had low oxygen saturations during sleep and refused nasal canaculi.     DVT Prophylaxis: Patient received Heparin SQ during hospitalization. There were no reports of leg pain/swelling, dyspnea/pleuiritc CP, or bleeding/bruising. Platelet levels remained stable during hospitalization.       Discharge Vitals:   BP 97/55  Pulse 91  Temp(Src) 97.5 F (36.4 C) (Oral)  Resp 20  Ht 5\' 11"  (1.803 m)  Wt 97.705 kg (215 lb 6.4 oz)  BMI 30.06 kg/m2  SpO2 97%  Discharge Labs:  No results found for this or any previous visit (from the past 24 hour(s)).  Signed: Otis Brace, MD 05/12/2013, 7:42 PM   Time Spent on Discharge: 90 minutes Services Ordered on Discharge: none Equipment Ordered on Discharge: none

## 2013-05-15 LAB — CULTURE, BLOOD (ROUTINE X 2)
Culture: NO GROWTH
Culture: NO GROWTH

## 2013-06-04 ENCOUNTER — Encounter: Payer: Self-pay | Admitting: Internal Medicine

## 2013-06-04 ENCOUNTER — Ambulatory Visit: Payer: Medicare Other | Attending: Internal Medicine | Admitting: Internal Medicine

## 2013-06-04 VITALS — BP 122/78 | HR 70 | Temp 98.0°F | Resp 17 | Ht 71.0 in | Wt 218.0 lb

## 2013-06-04 DIAGNOSIS — J189 Pneumonia, unspecified organism: Secondary | ICD-10-CM | POA: Insufficient documentation

## 2013-06-04 DIAGNOSIS — E119 Type 2 diabetes mellitus without complications: Secondary | ICD-10-CM | POA: Insufficient documentation

## 2013-06-04 LAB — CBC WITH DIFFERENTIAL/PLATELET
Basophils Absolute: 0 10*3/uL (ref 0.0–0.1)
Basophils Relative: 0 % (ref 0–1)
Eosinophils Relative: 1 % (ref 0–5)
HCT: 40 % (ref 39.0–52.0)
Lymphocytes Relative: 26 % (ref 12–46)
Lymphs Abs: 1.9 10*3/uL (ref 0.7–4.0)
MCH: 26.3 pg (ref 26.0–34.0)
MCHC: 29.8 g/dL — ABNORMAL LOW (ref 30.0–36.0)
MCV: 88.5 fL (ref 78.0–100.0)
Monocytes Relative: 8 % (ref 3–12)
Neutro Abs: 4.8 10*3/uL (ref 1.7–7.7)
Platelets: 147 10*3/uL — ABNORMAL LOW (ref 150–400)
RBC: 4.52 MIL/uL (ref 4.22–5.81)
RDW: 14.2 % (ref 11.5–15.5)
WBC: 7.4 10*3/uL (ref 4.0–10.5)

## 2013-06-04 LAB — COMPLETE METABOLIC PANEL WITH GFR
ALT: 11 U/L (ref 0–53)
AST: 10 U/L (ref 0–37)
Albumin: 3.6 g/dL (ref 3.5–5.2)
Alkaline Phosphatase: 61 U/L (ref 39–117)
BUN: 32 mg/dL — ABNORMAL HIGH (ref 6–23)
CO2: 24 mEq/L (ref 19–32)
Calcium: 10 mg/dL (ref 8.4–10.5)
Creat: 1.38 mg/dL — ABNORMAL HIGH (ref 0.50–1.35)
GFR, Est Non African American: 58 mL/min — ABNORMAL LOW
Glucose, Bld: 240 mg/dL — ABNORMAL HIGH (ref 70–99)
Potassium: 4.6 mEq/L (ref 3.5–5.3)

## 2013-06-04 MED ORDER — INSULIN NPH ISOPHANE & REGULAR (70-30) 100 UNIT/ML ~~LOC~~ SUSP: 20.0000 [IU] | Freq: Two times a day (BID) | SUBCUTANEOUS | Status: DC

## 2013-06-04 NOTE — Progress Notes (Signed)
Patient is follow up from hospital Was admitted with pneumonia History of kidney transplant

## 2013-06-04 NOTE — Progress Notes (Signed)
Patient ID: Trevor Thompson, male   DOB: 1960/09/18, 52 y.o.   MRN: [id number]   CC:  HPI:  Trevor Thompson is a 52 year old with HTN, HL, complicated Type I DM, PVD s/p left BKA 2013, right renal transplant in 2004 on immunosuppressives, who presented with cough and was diagnosed with right middle lobe pneumonia. Patient was treated with levofloxacin vancomycin Solu-Medrol in the ED. He was transitioned to oral levofloxacin and azithromycin. He states that he has completed his oral antibiotics needed he has no subjective fevers, no chest pain or shortness of breath He has a history of type 2 diabetes insulin-dependent, last hemoglobin A1c was 13.7. The patient states that he has not taken his insulin since discharge because he has been unable to afford it. According to the discharge summary, Patient was given 2 months worth of free coupons for Novolog 70/30 pen and told that [facility] has affordable option as well. He was instructed to take 20 U BID, monitor his blood sugars x4 daily and call [facility] hospital operator if CBG >250 or <100 over the weekend  Patient's AST was elevated during his hospitalization.    Allergies  Allergen Reactions  . Penicillins Other (See Comments)    Since birth   Past Medical History  Diagnosis Date  . Hypertension   . PVD (peripheral vascular disease)   . S/p cadaver renal transplant   . Cold intolerance   . 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 GERD (gastroesophageal reflux disease)   . 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 Hypertriglyceridemia   . Diabetic retinopathy   . Sleep apnea     in the past .  Was cleared in 2004 to not wear CPap.  Dr in [place]  . High cholesterol   . Pneumonia 05/09/2013    "first time was today" (05/09/2013)  . Type II diabetes mellitus   . ESRD (end stage renal disease)     "no dialysis; had kidney transplant" (05/09/2013)   Current Outpatient Prescriptions on File Prior to Visit  Medication Sig Dispense Refill  .  lisinopril (PRINIVIL,ZESTRIL) 10 MG tablet Take 10 mg by mouth daily.      . metoprolol tartrate (LOPRESSOR) 25 MG tablet Take 25 mg by mouth 2 (two) times daily.      . mycophenolate (MYFORTIC) 360 MG TBEC EC tablet Take 360 mg by mouth See admin instructions. Patient reports taking 720mg  in the morning and 360mg  at night      . predniSONE (DELTASONE) 5 MG tablet Take 5 mg by mouth daily.      . tacrolimus (PROGRAF) 1 MG capsule Take 4 mg by mouth 2 (two) times daily.      Marland Kitchen azithromycin (ZITHROMAX) 500 MG tablet Take 1 tablet (500 mg total) by mouth daily.  7 tablet  0   No current facility-administered medications on file prior to visit.   Family History  Problem Relation Age of Onset  . Diabetes Mother   . Diabetes Father   . Lupus Sister    History   Social History  . Marital Status: Married    Spouse Name: N/A    Number of Children: N/A  . Years of Education: N/A   Occupational History  . Not on file.   Social History Main Topics  . Smoking status: Former Smoker -- 0.30 packs/day for 3 years    Types: Cigarettes    Quit date: 07/05/1981  . Smokeless tobacco: Never Used  . Alcohol Use: 1.8 oz/week  3 Cans of beer per week  . Drug Use: Yes     Comment: 05/09/2013 "smoked pot in the 1970's; tried it again ~ 2010; stopped again"  . Sexual Activity: Yes   Other Topics Concern  . Not on file   Social History Narrative   Pt is married and traveling thru [place]    Review of Systems  Constitutional: Negative for fever, chills, diaphoresis, activity change, appetite change and fatigue.  HENT: Negative for ear pain, nosebleeds, congestion, facial swelling, rhinorrhea, neck pain, neck stiffness and ear discharge.   Eyes: Negative for pain, discharge, redness, itching and visual disturbance.  Respiratory: Negative for cough, choking, chest tightness, shortness of breath, wheezing and stridor.   Cardiovascular: Negative for chest pain, palpitations and leg swelling.   Gastrointestinal: Negative for abdominal distention.  Genitourinary: Negative for dysuria, urgency, frequency, hematuria, flank pain, decreased urine volume, difficulty urinating and dyspareunia.  Musculoskeletal: Negative for back pain, joint swelling, arthralgias and gait problem.  Neurological: Negative for dizziness, tremors, seizures, syncope, facial asymmetry, speech difficulty, weakness, light-headedness, numbness and headaches.  Hematological: Negative for adenopathy. Does not bruise/bleed easily.  Psychiatric/Behavioral: Negative for hallucinations, behavioral problems, confusion, dysphoric mood, decreased concentration and agitation.    Objective:   Filed Vitals:   06/04/13 1218  BP: 122/78  Pulse: 70  Temp: 98 F (36.7 C)  Resp: 17    Physical Exam  Constitutional: Appears well-developed and well-nourished. No distress.  HENT: Normocephalic. External right and left ear normal. Oropharynx is clear and moist.  Eyes: Conjunctivae and EOM are normal. PERRLA, no scleral icterus.  Neck: Normal ROM. Neck supple. No JVD. No tracheal deviation. No thyromegaly.  CVS: RRR, S1/S2 +, no murmurs, no gallops, no carotid bruit.  Pulmonary: Effort and breath sounds normal, no stridor, rhonchi, wheezes, rales.  Abdominal: Soft. BS +,  no distension, tenderness, rebound or guarding.  Musculoskeletal: Normal range of motion. No edema and no tenderness.  Lymphadenopathy: No lymphadenopathy noted, cervical, inguinal. Neuro: Alert. Normal reflexes, muscle tone coordination. No cranial nerve deficit. Skin: Skin is warm and dry. No rash noted. Not diaphoretic. No erythema. No pallor.  Psychiatric: Normal mood and affect. Behavior, judgment, thought content normal.   Lab Results  Component Value Date   WBC 15.0* 05/11/2013   HGB 12.1* 05/11/2013   HCT 37.1* 05/11/2013   MCV 83.6 05/11/2013   PLT 163 05/11/2013   Lab Results  Component Value Date   CREATININE 1.12 05/11/2013   BUN 33*  05/11/2013   NA 132* 05/11/2013   K 4.4 05/11/2013   CL 99 05/11/2013   CO2 23 05/11/2013    Lab Results  Component Value Date   HGBA1C 13.7* 05/09/2013   Lipid Panel     Component Value Date/Time   CHOL 131 02/22/2012 0442   TRIG 128 02/22/2012 0442   HDL 64 02/22/2012 0442   CHOLHDL 2.0 02/22/2012 0442   VLDL 26 02/22/2012 0442   LDLCALC 41 02/22/2012 0442       Assessment and plan:   Patient Active Problem List   Diagnosis Date Noted  . Sepsis 05/09/2013  . CAP (community acquired pneumonia) 05/09/2013  . S/P BKA (below knee amputation) 03/24/2012  . Fever 02/28/2012  . 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 Acute kidney injury 02/27/2012  . Leukocytosis 02/26/2012  . 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 Cellulitis of left foot 02/21/2012  . Necrotic toe 02/21/2012  .  ? PVD (peripheral vascular disease) 02/21/2012  .  History of renal transplant 02/21/2012  . 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 Insulin dependent type 2 diabetes mellitus, uncontrolled 02/21/2012  . HTN (hypertension) 02/21/2012  . Dehydration as evidenced by hemoconcentration 02/21/2012  . Left foot pain 02/21/2012  . Type 1 diabetes mellitus, uncontrolled   . Hypertension   . S/p cadaver renal transplant   . Cold intolerance   . 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 GERD (gastroesophageal reflux disease)   . 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 Hypertriglyceridemia   . Diabetic retinopathy        Right middle lobe pneumonia Resolved   Type 2 diabetes insulin-dependent Patient received coupons from the hospital for his insulin We will try to see the patient can receive insulin from the [facility] He also needs to meet her financial counselor   Transaminitis we'll repeat CMP panel today    The patient was given clear instructions to go to ER or return to medical center if symptoms don't improve,  worsen or new problems develop. The patient verbalized understanding. The patient was told to call to get any lab results if not heard anything in the next week.

## 2013-06-06 ENCOUNTER — Telehealth: Payer: Self-pay

## 2013-06-06 MED ORDER — INSULIN ASPART PROT & ASPART (70-30 MIX) 100 UNIT/ML ~~LOC~~ SUSP: 20.0000 [IU] | Freq: Two times a day (BID) | SUBCUTANEOUS | Status: DC

## 2013-06-06 NOTE — Telephone Encounter (Signed)
Patient had coupon for free insulin Prescription has to be changed to novolog 70/30 Dr Susie Cassette changed the script Patient aware to pick up at the front desk

## 2013-06-19 ENCOUNTER — Encounter: Payer: Self-pay | Admitting: Internal Medicine

## 2013-06-19 ENCOUNTER — Ambulatory Visit: Payer: Medicare Other | Attending: Internal Medicine | Admitting: Internal Medicine

## 2013-06-19 ENCOUNTER — Other Ambulatory Visit: Payer: Self-pay | Admitting: Nephrology

## 2013-06-19 ENCOUNTER — Ambulatory Visit: Payer: Medicare Other

## 2013-06-19 VITALS — BP 122/74 | HR 76 | Temp 98.0°F | Resp 16 | Wt 225.6 lb

## 2013-06-19 DIAGNOSIS — IMO0001 Reserved for inherently not codable concepts without codable children: Secondary | ICD-10-CM | POA: Insufficient documentation

## 2013-06-19 DIAGNOSIS — Z940 Kidney transplant status: Secondary | ICD-10-CM

## 2013-06-19 DIAGNOSIS — R195 Other fecal abnormalities: Secondary | ICD-10-CM

## 2013-06-19 DIAGNOSIS — E1165 Type 2 diabetes mellitus with hyperglycemia: Secondary | ICD-10-CM

## 2013-06-19 MED ORDER — LOPERAMIDE HCL 2 MG PO CAPS: 2.0000 mg | ORAL_CAPSULE | ORAL | Status: DC | PRN

## 2013-06-19 NOTE — Progress Notes (Signed)
Patient here for follow up-DM Blood sugar this am is 286 Needs [place] to draw prograf level

## 2013-06-19 NOTE — Progress Notes (Signed)
MRN: [id number] Name: Trevor Thompson  Sex: male Age: 52 y.o. DOB: 04-Feb-1961  Allergies: Penicillins  Chief Complaint  Patient presents with  . Follow-up  . Diabetes    HPI: Patient is 52 y.o. male who comes today for diabetes followup, was seen last month and was prescribed NovoLog 70/30 to take 20 units twice a day, as per patient he is not taking it instead he had Lantus as well as short-acting insulin which he was taking with meals, today fasting sugar was 280, patient denies any hypoglycemic symptoms. Patient reported to have loose bowel movements denies any fever chills nausea vomiting or any abdominal pain. Patient has these ongoing symptoms for the last several months.   Past Medical History  Diagnosis Date  . Hypertension   . PVD (peripheral vascular disease)   . S/p cadaver renal transplant   . Cold intolerance   . 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 GERD (gastroesophageal reflux disease)   . 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 Hypertriglyceridemia   . Diabetic retinopathy   . Sleep apnea     in the past .  Was cleared in 2004 to not wear CPap.  [place] in [place]  . High cholesterol   . Pneumonia 05/09/2013    "first time was today" (05/09/2013)  . Type II diabetes mellitus   . ESRD (end stage renal disease)     "no dialysis; had kidney transplant" (05/09/2013)    Past Surgical History  Procedure Laterality Date  . Kidney transplant Right 2004  . Cataract extraction w/ intraocular lens  implant, bilateral Bilateral ~ 1997  . Av fistula placement  1997-2004    "I've got 3; right upper arm; right forearm; left forearm "  . Av fistula repair  1997-2004    "multiple"  . Amputation  02/25/2012    Procedure: AMPUTATION DIGIT;  Surgeon: Larina Earthly, MD;  Location: [facility] OR;  Service: Vascular;  Laterality: Left;  Left 2nd Toe Amputation, Incision and Drainage left lateral foot.  . Amputation  03/22/2012    Procedure: AMPUTATION BELOW KNEE;  Surgeon: Nadara Mustard, MD;   Location: [facility] OR;  Service: Orthopedics;  Laterality: Left;  Left Below Knee Amputation  . Amputation  04/21/2012    Procedure: AMPUTATION BELOW KNEE;  Surgeon: Nadara Mustard, MD;  Location: [facility] OR;  Service: Orthopedics;  Laterality: Left;  Left below knee amputation revision      Medication List       This list is accurate as of: 06/19/13  5:20 PM.  Always use your most recent med list.               azithromycin 500 MG tablet  Commonly known as:  ZITHROMAX  Take 1 tablet (500 mg total) by mouth daily.     insulin aspart protamine- aspart (70-30) 100 UNIT/ML injection  Commonly known as:  NOVOLOG MIX 70/30  Inject 0.2 mLs (20 Units total) into the skin 2 (two) times daily with a meal.     lisinopril 10 MG tablet  Commonly known as:  PRINIVIL,ZESTRIL  Take 10 mg by mouth daily.     loperamide 2 MG capsule  Commonly known as:  IMODIUM  Take 1 capsule (2 mg total) by mouth as needed for diarrhea or loose stools.     metoprolol tartrate 25 MG tablet  Commonly known as:  LOPRESSOR  Take 25 mg by mouth 2 (two) times daily.     mycophenolate 360 MG  Tbec EC tablet  Commonly known as:  MYFORTIC  Take 360 mg by mouth See admin instructions. Patient reports taking 720mg  in the morning and 360mg  at night     predniSONE 5 MG tablet  Commonly known as:  DELTASONE  Take 5 mg by mouth daily.     tacrolimus 1 MG capsule  Commonly known as:  PROGRAF  Take 4 mg by mouth 2 (two) times daily.        Meds ordered this encounter  Medications  . loperamide (IMODIUM) 2 MG capsule    Sig: Take 1 capsule (2 mg total) by mouth as needed for diarrhea or loose stools.    Dispense:  30 capsule    Refill:  1    Immunization History  Administered Date(s) Administered  . Influenza Split 03/23/2012  . Influenza,inj,Quad PF,36+ Mos 05/11/2013  . Pneumococcal Polysaccharide-23 03/23/2012    Family History  Problem Relation Age of Onset  . Diabetes Mother   . Diabetes Father   . Lupus  Sister     History  Substance Use Topics  . Smoking status: Former Smoker -- 0.30 packs/day for 3 years    Types: Cigarettes    Quit date: 07/05/1981  . Smokeless tobacco: Never Used  . Alcohol Use: 1.8 oz/week    3 Cans of beer per week    Review of Systems  As noted in HPI  Filed Vitals:   06/19/13 1636  BP: 122/74  Pulse: 76  Temp: 98 F (36.7 C)  Resp: 16    Physical Exam  Physical Exam  Eyes: EOM are normal. Pupils are equal, round, and reactive to light.  Cardiovascular: Normal rate and regular rhythm.   Pulmonary/Chest: Breath sounds normal. He has no wheezes. He has no rales.  Abdominal: Soft. There is no tenderness. There is no rebound.    CBC    Component Value Date/Time   WBC 7.4 06/04/2013 1303   RBC 4.52 06/04/2013 1303   HGB 11.9* 06/04/2013 1303   HCT 40.0 06/04/2013 1303   PLT 147* 06/04/2013 1303   MCV 88.5 06/04/2013 1303   LYMPHSABS 1.9 06/04/2013 1303   MONOABS 0.6 06/04/2013 1303   EOSABS 0.1 06/04/2013 1303   BASOSABS 0.0 06/04/2013 1303    CMP     Component Value Date/Time   NA 137 06/04/2013 1303   K 4.6 06/04/2013 1303   CL 105 06/04/2013 1303   CO2 24 06/04/2013 1303   GLUCOSE 240* 06/04/2013 1303   BUN 32* 06/04/2013 1303   CREATININE 1.38* 06/04/2013 1303   CREATININE 1.12 05/11/2013 0805   CALCIUM 10.0 06/04/2013 1303   PROT 6.8 06/04/2013 1303   ALBUMIN 3.6 06/04/2013 1303   AST 10 06/04/2013 1303   ALT 11 06/04/2013 1303   ALKPHOS 61 06/04/2013 1303   BILITOT 0.6 06/04/2013 1303   GFRNONAA 73* 05/11/2013 0805   GFRAA 85* 05/11/2013 0805    Lab Results  Component Value Date/Time   CHOL 131 02/22/2012  4:42 AM    No components found with this basename: hga1c    Lab Results  Component Value Date/Time   AST 10 06/04/2013  1:03 PM    Assessment and Plan  Insulin dependent type 2 diabetes mellitus, uncontrolled     ... Patient will now start taking NovoLog 70/30 20 units twice a day advised patient to monitor his sugar, a fasting  sugars more than 150 can increase the dose to 22 units and titrate. Will check hemoglobin A1c  on the next visit.  History of renal transplant ..   following up with the nephrologist was advised to get blood work done for Prograf, level will be sent.   Loose stools - Plan: Advised patient for BRAT diet ann prescribed  loperamide (IMODIUM) 2 MG capsule when necessary.    Return in about 2 months (around 08/20/2013).  Doris Cheadle, MD

## 2013-06-20 LAB — TACROLIMUS LEVEL: Tacrolimus Lvl: 3.4 ng/mL — ABNORMAL LOW (ref 5.0–20.0)

## 2013-07-02 ENCOUNTER — Encounter: Payer: Self-pay | Admitting: Podiatry

## 2013-07-02 ENCOUNTER — Ambulatory Visit (INDEPENDENT_AMBULATORY_CARE_PROVIDER_SITE_OTHER): Payer: Medicare Other | Admitting: Podiatry

## 2013-07-02 VITALS — BP 122/74 | HR 72 | Resp 16

## 2013-07-02 DIAGNOSIS — M79609 Pain in unspecified limb: Secondary | ICD-10-CM

## 2013-07-02 DIAGNOSIS — B351 Tinea unguium: Secondary | ICD-10-CM

## 2013-07-02 NOTE — Progress Notes (Signed)
Subjective:     Patient ID: Trevor Thompson, male   DOB: January 15, 1961, 52 y.o.   MRN: [id number]  HPI patient presents with nail disease 1-5 right with thickness and pain in the nailbeds and loss a left leg secondary to vascular disease diabetes and infection   Review of Systems     Objective:   Physical Exam Neurovascular status right unchanged with thick painful nail bed 1-5 right feet    Assessment:     Mycotic nail infection with pain 1-5 right    Plan:     Debridement painful nail bed 1-5 right foot with patient with risk factor

## 2013-07-02 NOTE — Patient Instructions (Signed)
Diabetes and Foot Care Diabetes may cause you to have problems because of poor blood supply (circulation) to your feet and legs. This may cause the skin on your feet to become thinner, break easier, and heal more slowly. Your skin may become dry, and the skin may peel and crack. You may also have nerve damage in your legs and feet causing decreased feeling in them. You may not notice minor injuries to your feet that could lead to infections or more serious problems. Taking care of your feet is one of the most important things you can do for yourself.  HOME CARE INSTRUCTIONS  Wear shoes at all times, even in the house. Do not go barefoot. Bare feet are easily injured.  Check your feet daily for blisters, cuts, and redness. If you cannot see the bottom of your feet, use a mirror or ask someone for help.  Wash your feet with warm water (do not use hot water) and mild soap. Then pat your feet and the areas between your toes until they are completely dry. Do not soak your feet as this can dry your skin.  Apply a moisturizing lotion or petroleum jelly (that does not contain alcohol and is unscented) to the skin on your feet and to dry, brittle toenails. Do not apply lotion between your toes.  Trim your toenails straight across. Do not dig under them or around the cuticle. File the edges of your nails with an emery board or nail file.  Do not cut corns or calluses or try to remove them with medicine.  Wear clean socks or stockings every day. Make sure they are not too tight. Do not wear knee-high stockings since they may decrease blood flow to your legs.  Wear shoes that fit properly and have enough cushioning. To break in new shoes, wear them for just a few hours a day. This prevents you from injuring your feet. Always look in your shoes before you put them on to be sure there are no objects inside.  Do not cross your legs. This may decrease the blood flow to your feet.  If you find a minor scrape,  cut, or break in the skin on your feet, keep it and the skin around it clean and dry. These areas may be cleansed with mild soap and water. Do not cleanse the area with peroxide, alcohol, or iodine.  When you remove an adhesive bandage, be sure not to damage the skin around it.  If you have a wound, look at it several times a day to make sure it is healing.  Do not use heating pads or hot water bottles. They may burn your skin. If you have lost feeling in your feet or legs, you may not know it is happening until it is too late.  Make sure your health care provider performs a complete foot exam at least annually or more often if you have foot problems. Report any cuts, sores, or bruises to your health care provider immediately. SEEK MEDICAL CARE IF:   You have an injury that is not healing.  You have cuts or breaks in the skin.  You have an ingrown nail.  You notice redness on your legs or feet.  You feel burning or tingling in your legs or feet.  You have pain or cramps in your legs and feet.  Your legs or feet are numb.  Your feet always feel cold. SEEK IMMEDIATE MEDICAL CARE IF:   There is increasing redness,   swelling, or pain in or around a wound.  There is a red line that goes up your leg.  Pus is coming from a wound.  You develop a fever or as directed by your health care provider.  You notice a bad smell coming from an ulcer or wound. Document Released: 06/18/2000 Document Revised: 02/21/2013 Document Reviewed: 11/28/2012 [employer] Patient Information 2014 [employer].  

## 2013-07-13 ENCOUNTER — Other Ambulatory Visit: Payer: Self-pay | Admitting: Internal Medicine

## 2013-07-22 ENCOUNTER — Inpatient Hospital Stay (HOSPITAL_COMMUNITY)
Admission: EM | Admit: 2013-07-22 | Discharge: 2013-07-28 | DRG: 871 | Disposition: A | Discharge status: Home / Self Care | Payer: Medicare Other | Attending: Family Medicine | Admitting: Family Medicine

## 2013-07-22 ENCOUNTER — Emergency Department (HOSPITAL_COMMUNITY): Payer: Medicare Other

## 2013-07-22 ENCOUNTER — Encounter (HOSPITAL_COMMUNITY): Payer: Self-pay | Admitting: Emergency Medicine

## 2013-07-22 DIAGNOSIS — D849 Immunodeficiency, unspecified: Secondary | ICD-10-CM

## 2013-07-22 DIAGNOSIS — N12 Tubulo-interstitial nephritis, not specified as acute or chronic: Secondary | ICD-10-CM | POA: Diagnosis present

## 2013-07-22 DIAGNOSIS — Z940 Kidney transplant status: Secondary | ICD-10-CM

## 2013-07-22 DIAGNOSIS — I120 Hypertensive chronic kidney disease with stage 5 chronic kidney disease or end stage renal disease: Secondary | ICD-10-CM | POA: Diagnosis present

## 2013-07-22 DIAGNOSIS — B9689 Other specified bacterial agents as the cause of diseases classified elsewhere: Secondary | ICD-10-CM | POA: Diagnosis present

## 2013-07-22 DIAGNOSIS — N186 End stage renal disease: Secondary | ICD-10-CM | POA: Diagnosis present

## 2013-07-22 DIAGNOSIS — N179 Acute kidney failure, unspecified: Secondary | ICD-10-CM

## 2013-07-22 DIAGNOSIS — T380X5A Adverse effect of glucocorticoids and synthetic analogues, initial encounter: Secondary | ICD-10-CM

## 2013-07-22 DIAGNOSIS — G4730 Sleep apnea, unspecified: Secondary | ICD-10-CM | POA: Diagnosis present

## 2013-07-22 DIAGNOSIS — E1139 Type 2 diabetes mellitus with other diabetic ophthalmic complication: Secondary | ICD-10-CM | POA: Diagnosis present

## 2013-07-22 DIAGNOSIS — A419 Sepsis, unspecified organism: Principal | ICD-10-CM

## 2013-07-22 DIAGNOSIS — E871 Hypo-osmolality and hyponatremia: Secondary | ICD-10-CM | POA: Diagnosis present

## 2013-07-22 DIAGNOSIS — E11319 Type 2 diabetes mellitus with unspecified diabetic retinopathy without macular edema: Secondary | ICD-10-CM

## 2013-07-22 DIAGNOSIS — D899 Disorder involving the immune mechanism, unspecified: Secondary | ICD-10-CM | POA: Diagnosis present

## 2013-07-22 DIAGNOSIS — G909 Disorder of the autonomic nervous system, unspecified: Secondary | ICD-10-CM | POA: Diagnosis present

## 2013-07-22 DIAGNOSIS — E872 Acidosis, unspecified: Secondary | ICD-10-CM | POA: Diagnosis present

## 2013-07-22 DIAGNOSIS — Z89519 Acquired absence of unspecified leg below knee: Secondary | ICD-10-CM

## 2013-07-22 DIAGNOSIS — E119 Type 2 diabetes mellitus without complications: Secondary | ICD-10-CM | POA: Diagnosis present

## 2013-07-22 DIAGNOSIS — N390 Urinary tract infection, site not specified: Secondary | ICD-10-CM | POA: Diagnosis present

## 2013-07-22 DIAGNOSIS — S88119A Complete traumatic amputation at level between knee and ankle, unspecified lower leg, initial encounter: Secondary | ICD-10-CM

## 2013-07-22 DIAGNOSIS — I10 Essential (primary) hypertension: Secondary | ICD-10-CM

## 2013-07-22 DIAGNOSIS — J189 Pneumonia, unspecified organism: Secondary | ICD-10-CM

## 2013-07-22 DIAGNOSIS — E860 Dehydration: Secondary | ICD-10-CM

## 2013-07-22 DIAGNOSIS — E1143 Type 2 diabetes mellitus with diabetic autonomic (poly)neuropathy: Secondary | ICD-10-CM

## 2013-07-22 DIAGNOSIS — E1165 Type 2 diabetes mellitus with hyperglycemia: Secondary | ICD-10-CM

## 2013-07-22 DIAGNOSIS — Z87891 Personal history of nicotine dependence: Secondary | ICD-10-CM

## 2013-07-22 DIAGNOSIS — R509 Fever, unspecified: Secondary | ICD-10-CM

## 2013-07-22 DIAGNOSIS — Z7952 Long term (current) use of systemic steroids: Secondary | ICD-10-CM

## 2013-07-22 DIAGNOSIS — D72829 Elevated white blood cell count, unspecified: Secondary | ICD-10-CM

## 2013-07-22 DIAGNOSIS — J9600 Acute respiratory failure, unspecified whether with hypoxia or hypercapnia: Secondary | ICD-10-CM

## 2013-07-22 DIAGNOSIS — E1149 Type 2 diabetes mellitus with other diabetic neurological complication: Secondary | ICD-10-CM | POA: Diagnosis present

## 2013-07-22 LAB — COMPREHENSIVE METABOLIC PANEL
ALT: 25 U/L (ref 0–53)
AST: 19 U/L (ref 0–37)
Albumin: 2.9 g/dL — ABNORMAL LOW (ref 3.5–5.2)
Alkaline Phosphatase: 96 U/L (ref 39–117)
BUN: 50 mg/dL — AB (ref 6–23)
CALCIUM: 9 mg/dL (ref 8.4–10.5)
CO2: 17 meq/L — AB (ref 19–32)
Chloride: 98 mEq/L (ref 96–112)
Creatinine, Ser: 2.2 mg/dL — ABNORMAL HIGH (ref 0.50–1.35)
GFR, EST AFRICAN AMERICAN: 38 mL/min — AB (ref >90)
GFR, EST NON AFRICAN AMERICAN: 32 mL/min — AB (ref >90)
GLUCOSE: 300 mg/dL — AB (ref 70–99)
Potassium: 4.7 mEq/L (ref 3.7–5.3)
Sodium: 132 mEq/L — ABNORMAL LOW (ref 137–147)
Total Bilirubin: 1.1 mg/dL (ref 0.3–1.2)
Total Protein: 7 g/dL (ref 6.0–8.3)

## 2013-07-22 LAB — CBC WITH DIFFERENTIAL/PLATELET
BASOS PCT: 0 % (ref 0–1)
Basophils Absolute: 0 10*3/uL (ref 0.0–0.1)
EOS ABS: 0 10*3/uL (ref 0.0–0.7)
Eosinophils Relative: 0 % (ref 0–5)
HEMATOCRIT: 36.4 % — AB (ref 39.0–52.0)
HEMOGLOBIN: 11.9 g/dL — AB (ref 13.0–17.0)
LYMPHS ABS: 2 10*3/uL (ref 0.7–4.0)
Lymphocytes Relative: 7 % — ABNORMAL LOW (ref 12–46)
MCH: 27.7 pg (ref 26.0–34.0)
MCHC: 32.7 g/dL (ref 30.0–36.0)
MCV: 84.8 fL (ref 78.0–100.0)
MONO ABS: 2.3 10*3/uL — AB (ref 0.1–1.0)
Monocytes Relative: 8 % (ref 3–12)
NEUTROS ABS: 24.7 10*3/uL — AB (ref 1.7–7.7)
Neutrophils Relative %: 85 % — ABNORMAL HIGH (ref 43–77)
Platelets: 140 10*3/uL — ABNORMAL LOW (ref 150–400)
RBC: 4.29 MIL/uL (ref 4.22–5.81)
RDW: 15.5 % (ref 11.5–15.5)
WBC: 29 10*3/uL — ABNORMAL HIGH (ref 4.0–10.5)

## 2013-07-22 LAB — CG4 I-STAT (LACTIC ACID): LACTIC ACID, VENOUS: 2.54 mmol/L — AB (ref 0.5–2.2)

## 2013-07-22 LAB — INFLUENZA PANEL BY PCR (TYPE A & B)
H1N1 flu by pcr: NOT DETECTED
INFLAPCR: NEGATIVE
Influenza B By PCR: NEGATIVE

## 2013-07-22 LAB — URINALYSIS, ROUTINE W REFLEX MICROSCOPIC
BILIRUBIN URINE: NEGATIVE
Glucose, UA: 250 mg/dL — AB
Ketones, ur: NEGATIVE mg/dL
NITRITE: POSITIVE — AB
Protein, ur: >300 mg/dL — AB
Specific Gravity, Urine: 1.017 (ref 1.005–1.030)
UROBILINOGEN UA: 0.2 mg/dL (ref 0.0–1.0)
pH: 5 (ref 5.0–8.0)

## 2013-07-22 LAB — GLUCOSE, CAPILLARY
Glucose-Capillary: 303 mg/dL — ABNORMAL HIGH (ref 70–99)
Glucose-Capillary: 161 mg/dL — ABNORMAL HIGH (ref 70–99)

## 2013-07-22 LAB — TROPONIN I

## 2013-07-22 LAB — URINE MICROSCOPIC-ADD ON

## 2013-07-22 MED ORDER — FENTANYL CITRATE 0.05 MG/ML IJ SOLN
25.0000 ug | Freq: Once | INTRAMUSCULAR | Status: AC
  Administered 2013-07-22: 25 ug via INTRAVENOUS
  Filled 2013-07-22: qty 2

## 2013-07-22 MED ORDER — HYDROCORTISONE SOD SUCCINATE 100 MG IJ SOLR
50.0000 mg | Freq: Once | INTRAMUSCULAR | Status: AC
  Administered 2013-07-22: 50 mg via INTRAVENOUS
  Filled 2013-07-22: qty 2

## 2013-07-22 MED ORDER — DEXTROSE 5 % IV SOLN
1.0000 g | Freq: Three times a day (TID) | INTRAVENOUS | Status: DC
  Administered 2013-07-22 – 2013-07-26 (×11): 1 g via INTRAVENOUS
  Filled 2013-07-22 (×13): qty 1

## 2013-07-22 MED ORDER — SODIUM CHLORIDE 0.9 % IV SOLN: INTRAVENOUS | Status: AC

## 2013-07-22 MED ORDER — TACROLIMUS 1 MG PO CAPS
4.0000 mg | ORAL_CAPSULE | Freq: Two times a day (BID) | ORAL | Status: DC
  Administered 2013-07-22 – 2013-07-28 (×13): 4 mg via ORAL
  Filled 2013-07-22 (×14): qty 4

## 2013-07-22 MED ORDER — SODIUM CHLORIDE 0.9 % IV SOLN
INTRAVENOUS | Status: DC
  Administered 2013-07-22 (×2): via INTRAVENOUS

## 2013-07-22 MED ORDER — HYDROCORTISONE SOD SUCCINATE 100 MG IJ SOLR
50.0000 mg | Freq: Three times a day (TID) | INTRAMUSCULAR | Status: DC
  Administered 2013-07-22 – 2013-07-23 (×3): 50 mg via INTRAVENOUS
  Administered 2013-07-23: 06:00:00 via INTRAVENOUS
  Filled 2013-07-22 (×6): qty 1

## 2013-07-22 MED ORDER — VANCOMYCIN HCL 10 G IV SOLR: 1500.0000 mg | INTRAVENOUS | Status: DC

## 2013-07-22 MED ORDER — SODIUM CHLORIDE 0.9 % IV BOLUS (SEPSIS)
1000.0000 mL | Freq: Once | INTRAVENOUS | Status: AC
  Administered 2013-07-22: 1000 mL via INTRAVENOUS

## 2013-07-22 MED ORDER — INSULIN ASPART 100 UNIT/ML ~~LOC~~ SOLN
0.0000 [IU] | Freq: Three times a day (TID) | SUBCUTANEOUS | Status: DC
  Administered 2013-07-22: 15 [IU] via SUBCUTANEOUS
  Administered 2013-07-23: 4 [IU] via SUBCUTANEOUS
  Administered 2013-07-23: 7 [IU] via SUBCUTANEOUS
  Administered 2013-07-24: 4 [IU] via SUBCUTANEOUS
  Administered 2013-07-24 (×2): 3 [IU] via SUBCUTANEOUS
  Administered 2013-07-25: 7 [IU] via SUBCUTANEOUS
  Administered 2013-07-25: 4 [IU] via SUBCUTANEOUS
  Administered 2013-07-26: 3 [IU] via SUBCUTANEOUS
  Administered 2013-07-26: 4 [IU] via SUBCUTANEOUS
  Administered 2013-07-26: 7 [IU] via SUBCUTANEOUS
  Administered 2013-07-27 – 2013-07-28 (×4): 4 [IU] via SUBCUTANEOUS

## 2013-07-22 MED ORDER — ALUM & MAG HYDROXIDE-SIMETH 200-200-20 MG/5ML PO SUSP: 30.0000 mL | Freq: Four times a day (QID) | ORAL | Status: DC | PRN

## 2013-07-22 MED ORDER — INSULIN ASPART PROT & ASPART (70-30 MIX) 100 UNIT/ML ~~LOC~~ SUSP
20.0000 [IU] | Freq: Two times a day (BID) | SUBCUTANEOUS | Status: DC
  Administered 2013-07-22 – 2013-07-28 (×12): 20 [IU] via SUBCUTANEOUS
  Filled 2013-07-22 (×2): qty 10

## 2013-07-22 MED ORDER — ACETAMINOPHEN 650 MG RE SUPP: 650.0000 mg | Freq: Four times a day (QID) | RECTAL | Status: DC | PRN

## 2013-07-22 MED ORDER — VANCOMYCIN HCL 10 G IV SOLR
2000.0000 mg | Freq: Once | INTRAVENOUS | Status: DC
  Administered 2013-07-22: 2000 mg via INTRAVENOUS
  Filled 2013-07-22: qty 2000

## 2013-07-22 MED ORDER — MORPHINE SULFATE 2 MG/ML IJ SOLN
2.0000 mg | INTRAMUSCULAR | Status: DC | PRN
  Administered 2013-07-23 – 2013-07-25 (×6): 2 mg via INTRAVENOUS
  Filled 2013-07-22 (×6): qty 1

## 2013-07-22 MED ORDER — INSULIN ASPART 100 UNIT/ML ~~LOC~~ SOLN
0.0000 [IU] | Freq: Every day | SUBCUTANEOUS | Status: DC
  Administered 2013-07-27: 3 [IU] via SUBCUTANEOUS

## 2013-07-22 MED ORDER — DEXTROSE 5 % IV SOLN
2.0000 g | Freq: Once | INTRAVENOUS | Status: AC
  Administered 2013-07-22: 2 g via INTRAVENOUS
  Filled 2013-07-22: qty 2

## 2013-07-22 MED ORDER — OXYCODONE HCL 5 MG PO TABS
5.0000 mg | ORAL_TABLET | Freq: Four times a day (QID) | ORAL | Status: DC | PRN
  Administered 2013-07-23 – 2013-07-24 (×2): 5 mg via ORAL
  Filled 2013-07-22 (×3): qty 1

## 2013-07-22 MED ORDER — VANCOMYCIN HCL IN DEXTROSE 1-5 GM/200ML-% IV SOLN
1000.0000 mg | Freq: Once | INTRAVENOUS | Status: DC
  Filled 2013-07-22: qty 200

## 2013-07-22 MED ORDER — ACETAMINOPHEN 325 MG PO TABS
650.0000 mg | ORAL_TABLET | Freq: Four times a day (QID) | ORAL | Status: DC | PRN
  Administered 2013-07-22 – 2013-07-23 (×2): 650 mg via ORAL
  Administered 2013-07-23: 325 mg via ORAL
  Filled 2013-07-22 (×3): qty 2

## 2013-07-22 MED ORDER — SODIUM CHLORIDE 0.9 % IJ SOLN
3.0000 mL | Freq: Two times a day (BID) | INTRAMUSCULAR | Status: DC
  Administered 2013-07-25 – 2013-07-27 (×4): 3 mL via INTRAVENOUS

## 2013-07-22 MED ORDER — ONDANSETRON HCL 4 MG/2ML IJ SOLN
4.0000 mg | Freq: Once | INTRAMUSCULAR | Status: AC
Start: 2013-07-22 — End: 2013-07-22
  Administered 2013-07-22: 4 mg via INTRAVENOUS
  Filled 2013-07-22: qty 2

## 2013-07-22 MED ORDER — ONDANSETRON HCL 4 MG/2ML IJ SOLN
4.0000 mg | Freq: Four times a day (QID) | INTRAMUSCULAR | Status: DC | PRN
  Administered 2013-07-23 – 2013-07-25 (×2): 4 mg via INTRAVENOUS
  Filled 2013-07-22 (×2): qty 2

## 2013-07-22 MED ORDER — MYCOPHENOLATE SODIUM 180 MG PO TBEC
360.0000 mg | DELAYED_RELEASE_TABLET | Freq: Every day | ORAL | Status: DC
  Administered 2013-07-22 – 2013-07-27 (×6): 360 mg via ORAL
  Filled 2013-07-22 (×8): qty 2

## 2013-07-22 MED ORDER — ONDANSETRON HCL 4 MG PO TABS: 4.0000 mg | ORAL_TABLET | Freq: Four times a day (QID) | ORAL | Status: DC | PRN

## 2013-07-22 MED ORDER — DEXTROSE 5 % IV SOLN
1.0000 g | Freq: Three times a day (TID) | INTRAVENOUS | Status: DC
  Filled 2013-07-22: qty 1

## 2013-07-22 MED ORDER — MYCOPHENOLATE SODIUM 180 MG PO TBEC
720.0000 mg | DELAYED_RELEASE_TABLET | Freq: Every day | ORAL | Status: DC
  Administered 2013-07-23 – 2013-07-28 (×6): 720 mg via ORAL
  Filled 2013-07-22 (×6): qty 4

## 2013-07-22 MED ORDER — HEPARIN SODIUM (PORCINE) 5000 UNIT/ML IJ SOLN
5000.0000 [IU] | Freq: Three times a day (TID) | INTRAMUSCULAR | Status: DC
  Administered 2013-07-22 – 2013-07-28 (×17): 5000 [IU] via SUBCUTANEOUS
  Filled 2013-07-22 (×21): qty 1

## 2013-07-22 MED ORDER — VANCOMYCIN HCL 10 G IV SOLR
1500.0000 mg | INTRAVENOUS | Status: DC
  Administered 2013-07-23: 1500 mg via INTRAVENOUS
  Filled 2013-07-22: qty 1500

## 2013-07-22 NOTE — ED Notes (Signed)
Pt knows that urine is needed 

## 2013-07-22 NOTE — Progress Notes (Signed)
ANTIBIOTIC CONSULT NOTE - INITIAL  Pharmacy Consult for vancomycin and aztreonam Indication: rule out sepsis  Allergies  Allergen Reactions  . Penicillins Other (See Comments)    Since birth    Patient Measurements:   Body Weight: 102.3 kg  Vital Signs: Temp: 100.2 F (37.9 C) (01/18 1134) Temp src: Oral (01/18 1134) BP: 108/66 mmHg (01/18 1200) Pulse Rate: 129 (01/18 1200) Intake/Output from previous day:   Intake/Output from this shift:    Labs:  Recent Labs  07/22/13 1020  WBC 29.0*  HGB 11.9*  PLT 140*  CREATININE 2.20*   The CrCl is unknown because both a height and weight (above a minimum accepted value) are required for this calculation. No results found for this basename: VANCOTROUGH, VANCOPEAK, VANCORANDOM, GENTTROUGH, GENTPEAK, GENTRANDOM, TOBRATROUGH, TOBRAPEAK, TOBRARND, AMIKACINPEAK, AMIKACINTROU, AMIKACIN,  in the last 72 hours   Microbiology: No results found for this or any previous visit (from the past 720 hour(s)).  Medical History: Past Medical History  Diagnosis Date  . Hypertension   . PVD (peripheral vascular disease)   . S/p cadaver renal transplant   . Cold intolerance   . 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 GERD (gastroesophageal reflux disease)   . 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 Hypertriglyceridemia   . Diabetic retinopathy   . Sleep apnea     in the past .  Was cleared in 2004 to not wear CPap.  [place] in [place]  . High cholesterol   . Pneumonia 05/09/2013    "first time was today" (05/09/2013)  . Type II diabetes mellitus   . ESRD (end stage renal disease)     "no dialysis; had kidney transplant" (05/09/2013)    Medications:  See med rec Assessment: 53 yo man to start broad spectrum antibiotics for r/o sepsis.  He has had weakness and nausea for the past three days.  His lactic acid is 2.54, WBC 29.0, HR 129 and T 100.2.    Goal of Therapy:  Vancomycin trough level 15-20 mcg/ml  Plan:  Vancomycin 2 gm IV X 1  then 1500 mg IV q24 hours Aztreonam 2gm IV X 1 as per MD then 1 gm IV q8 hours Will f/u renal function, clinical course, and cultures.  Talbert CageSeay, Trevor Thompson 07/22/2013,12:22 PM

## 2013-07-22 NOTE — ED Notes (Signed)
Pt reports N/V that started Thursday.  Pt also reports weakness.  Pt's VS in Triage 84/58 and 87/55.  Pt has low grade temp 99.8 and HR 125.

## 2013-07-22 NOTE — Progress Notes (Signed)
Pt given sterile sputum trap and given instructions on how to perform sputum collection.  RN aware.

## 2013-07-22 NOTE — ED Notes (Signed)
PT reports a one day Hx of vomiting , generalized pain .

## 2013-07-22 NOTE — ED Provider Notes (Signed)
CSN: [id number]     Arrival date & time 07/22/13  [id number] History   First MD Initiated Contact with Patient 07/22/13 [phone]     Chief Complaint  Patient presents with  . Weakness  . Nausea  . Emesis   (Consider location/radiation/quality/duration/timing/severity/associated sxs/prior Treatment) HPI Patient presents with weakness and nausea. Symptoms began 3 days ago.  Since onset symptoms have been progressive.  There is associated anorexia, no diarrhea.  There is associated emesis. There is associated fever. There is no focal pain, just diffuse discomfort. Patient states that he was compliant with all medications until the past day. Patient has multiple medical problems, including diabetes, hypertension, prior kidney transplant. Patient is on steroids chronically.  Past Medical History  Diagnosis Date  . Hypertension   . PVD (peripheral vascular disease)   . S/p cadaver renal transplant   . Cold intolerance   . 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 GERD (gastroesophageal reflux disease)   . 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 Hypertriglyceridemia   . Diabetic retinopathy   . Sleep apnea     in the past .  Was cleared in 2004 to not wear CPap.  Dr in [place]  . High cholesterol   . Pneumonia 05/09/2013    "first time was today" (05/09/2013)  . Type II diabetes mellitus   . ESRD (end stage renal disease)     "no dialysis; had kidney transplant" (05/09/2013)   Past Surgical History  Procedure Laterality Date  . Kidney transplant Right 2004  . Cataract extraction w/ intraocular lens  implant, bilateral Bilateral ~ 1997  . Av fistula placement  1997-2004    "I've got 3; right upper arm; right forearm; left forearm "  . Av fistula repair  1997-2004    "multiple"  . Amputation  02/25/2012    Procedure: AMPUTATION DIGIT;  Surgeon: Larina Earthlyodd F Early, MD;  [facility];  Service: Vascular;  Laterality: Left;  Left 2nd Toe Amputation, Incision and Drainage left lateral foot.  .  Amputation  03/22/2012    Procedure: AMPUTATION BELOW KNEE;  Surgeon: Nadara MustardMarcus V Duda, MD;  Location: [facility] OR;  Service: Orthopedics;  Laterality: Left;  Left Below Knee Amputation  . Amputation  04/21/2012    Procedure: AMPUTATION BELOW KNEE;  Surgeon: Nadara MustardMarcus V Duda, MD;  Location: [facility] OR;  Service: Orthopedics;  Laterality: Left;  Left below knee amputation revision   Family History  Problem Relation Age of Onset  . Diabetes Mother   . Diabetes Father   . Lupus Sister    History  Substance Use Topics  . Smoking status: Former Smoker -- 0.30 packs/day for 3 years    Types: Cigarettes    Quit date: 07/05/1981  . Smokeless tobacco: Never Used  . Alcohol Use: 1.8 oz/week    3 Cans of beer per week    Review of Systems  Constitutional:       Per HPI, otherwise negative  HENT:       Per HPI, otherwise negative  Respiratory:       Per HPI, otherwise negative  Cardiovascular:       Per HPI, otherwise negative  Gastrointestinal: Positive for vomiting.  Endocrine:       Negative aside from HPI  Genitourinary:       Neg aside from HPI   Musculoskeletal:       Per HPI, otherwise negative  Skin: Negative.   Neurological: Negative for syncope.    Allergies  Penicillins  Home Medications   Current Outpatient Rx  Name  Route  Sig  Dispense  Refill  . azithromycin (ZITHROMAX) 500 MG tablet   Oral   Take 1 tablet (500 mg total) by mouth daily.   7 tablet   0   . HUMULIN 70/30 (70-30) 100 UNIT/ML injection      INJECT 20 UNITS UNDER THE SKIN 2 TIMES DAILY WITH MEALS.   10 mL   1   . insulin aspart protamine- aspart (NOVOLOG MIX 70/30) (70-30) 100 UNIT/ML injection   Subcutaneous   Inject 0.2 mLs (20 Units total) into the skin 2 (two) times daily with a meal.   10 mL   12   . lisinopril (PRINIVIL,ZESTRIL) 10 MG tablet   Oral   Take 10 mg by mouth daily.         Marland Kitchen loperamide (IMODIUM) 2 MG capsule   Oral   Take 1 capsule (2 mg total) by mouth as needed for diarrhea or  loose stools.   30 capsule   1   . metoprolol tartrate (LOPRESSOR) 25 MG tablet   Oral   Take 25 mg by mouth 2 (two) times daily.         . mycophenolate (MYFORTIC) 360 MG TBEC EC tablet   Oral   Take 360 mg by mouth See admin instructions. Patient reports taking 720mg  in the morning and 360mg  at night         . predniSONE (DELTASONE) 5 MG tablet   Oral   Take 5 mg by mouth daily.         . tacrolimus (PROGRAF) 1 MG capsule   Oral   Take 4 mg by mouth 2 (two) times daily.          BP 99/56  Pulse 126  Temp(Src) 99.8 F (37.7 C) (Oral)  Resp 22  SpO2 91% Physical Exam  Nursing note and vitals reviewed. Constitutional: He is oriented to person, place, and time. He appears well-developed. No distress.  HENT:  Head: Normocephalic and atraumatic.  Eyes: Conjunctivae and EOM are normal.  Cardiovascular: Normal rate and regular rhythm.   Pulmonary/Chest: Effort normal. No stridor. No respiratory distress.  Abdominal: He exhibits no distension.  Musculoskeletal: He exhibits no edema.  L BKA  Neurological: He is alert and oriented to person, place, and time. He displays no atrophy. No cranial nerve deficit or sensory deficit. He exhibits normal muscle tone.  Skin: Skin is warm and dry.  Psychiatric: He has a normal mood and affect.    ED Course  Procedures (including critical care time) Labs Review Labs Reviewed  CBC WITH DIFFERENTIAL  COMPREHENSIVE METABOLIC PANEL  TROPONIN I  URINALYSIS, ROUTINE W REFLEX MICROSCOPIC   Imaging Review No results found.  EKG Interpretation    Date/Time:  Sunday July 22 2013 10:40:17 EST Ventricular Rate:  129 PR Interval:  89 QRS Duration: 85 QT Interval:  282 QTC Calculation: 413 R Axis:   85 Text Interpretation:  Age not entered, assumed to be  53 years old for purpose of ECG interpretation Sinus or ectopic atrial tachycardia Low voltage, precordial leads Probable anteroseptal infarct, old Minimal ST elevation,  lateral leads Sinus tachycardia Abnormal ekg Confirmed by Gerhard Munch  MD [id number]) on 07/22/2013 2:38:48 PM           immediately after the initial evaluation with the tachycardia, hypotension, patient received IV fluids  Update: On repeat exam the patient appears in similar condition, tachycardia  persists, though the patient is now improving in terms of blood pressure.  Update: Initial labs notable for marked acidosis, leukocytosis.  Patient has are received IV fluids. With his notable abnormality he will receive empiric antibiotics for presumed sepsis.  X-ray does not demonstrate pneumonia, though the patient's history is notable for pneumonia one month ago, requiring admission here.   Update: Labs demonstrate a urinary tract infection.  Patient has received antibiotics that will cover likely pathogens. In addition, the patient has new renal failure.  MDM   1. Sepsis   2. Acute kidney injury   3. 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 Dehydration   5. Diabetic retinopathy   6. Fever   7. Hypertension   8. 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 Leukocytosis   10. S/P BKA (below knee amputation)    This patient with multiple medical problems now presents with generalized complaints, and on exam his fatigue, tachycardic and hypotensive, diaphoretic.  Patient's evaluation is notable for demonstration of acute renal failure lactic acidosis, leukocytosis.  Patient received IV fluids and analgesia, with some improvement in his subjective condition. After initiation of resuscitation, patient also received empiric antibiotics and a stress dose steroids..  Patient required admission for further evaluation and management.  CRITICAL CARE Performed by: Gerhard Munch Total critical care time: 35 Critical care time was exclusive of separately billable procedures and treating other patients. Critical care was necessary to treat or prevent imminent or life-threatening  deterioration. Critical care was time spent personally by me on the following activities: development of treatment plan with patient and/or surrogate as well as nursing, discussions with consultants, evaluation of patient's response to treatment, examination of patient, obtaining history from patient or surrogate, ordering and performing treatments and interventions, ordering and review of laboratory studies, ordering and review of radiographic studies, pulse oximetry and re-evaluation of patient's condition.     Gerhard Munch, MD 07/22/13 [phone]

## 2013-07-22 NOTE — H&P (Signed)
[facility] History and Physical  Trevor Thompson ZOX:[id number] DOB: Nov 11, 1960 DOA: 07/22/2013  Referring physician:  PCP: Trevor Lewandowsky, MD   Chief Complaint: Cough  HPI: Trevor Thompson is a 53 y.o. 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 Patient reports having a 3 to 4 day history of cough with associated sputum production characterized as yellowish to brown, also having subjective fevers, chills, night sweats, nausea and vomiting, generalized weakness, fatigue, malaise, and feeling quite poorly. Symptoms have progressively worsened as he stated feeling so weak today that he couldn't walk. He denies recent travel or sick contacts. Denies recent antibiotic use. No reports of headache, chest pain, significant shortness of breath, abdominal pain, hemoptysis, hematemesis, bloody stools or dysuria. he presented hypotensive to the emergency department with systolic blood pressures in the 80s improved to low 100s administration of IV fluids and stress dose of steroids. He was started on empiric IV antibiotic therapy with vancomycin and aztreonam. Lab work showing elevated white count of 29,000 as well as development of acute renal failure with creatinine of 2.2 and BUN of 50. Chest x-ray showed no acute findings.                                                                                                                                                                       Review of Systems:  Constitutional:  Positive for night sweats, Fevers, chills, fatigue.  HEENT:  No headaches, Difficulty swallowing,Tooth/dental problems,Sore throat,  No sneezing, itching, ear ache, nasal  congestion, post nasal drip,  Cardio-vascular:  No chest pain, Orthopnea, PND, swelling in lower extremities, anasarca, dizziness, palpitations  GI:  No heartburn, indigestion, abdominal pain, nausea, vomiting, diarrhea, change in bowel habits, loss of appetite  Resp:  No shortness of breath with exertion or at rest. No excess mucus, Positive for productive cough.  No coughing up of blood.No change in color of mucus.No wheezing.No chest wall deformity  Skin:  no rash or lesions.  GU:  no dysuria, change in color of urine, no urgency or frequency. No flank pain.  Musculoskeletal:  No joint pain or swelling. No decreased range of motion. No back pain.  Psych:  No change in mood or affect. No depression or anxiety. No memory loss.   Past Medical History  Diagnosis Date  . Hypertension   . PVD (peripheral vascular disease)   . S/p cadaver renal transplant   . Cold intolerance   . 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 GERD (gastroesophageal reflux disease)   . 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 Hypertriglyceridemia   . Diabetic retinopathy   . Sleep apnea     in the past .  Was cleared in 2004 to not wear CPap.  [place] in [place]  . High cholesterol   . Pneumonia 05/09/2013    "first time was today" (05/09/2013)  . Type II diabetes mellitus   . ESRD (end stage renal disease)     "no dialysis; had kidney transplant" (05/09/2013)   Past Surgical History  Procedure Laterality Date  . Kidney transplant Right 2004  . Cataract extraction w/ intraocular lens  implant, bilateral Bilateral ~ 1997  . Av fistula placement  1997-2004    "I've got 3; right upper arm; right forearm; left forearm "  . Av fistula repair  1997-2004    "multiple"  . Amputation  02/25/2012    Procedure: AMPUTATION DIGIT;  Surgeon: Larina Earthlyodd F Early, MD;  [facility];  Service: Vascular;  Laterality: Left;  Left 2nd Toe Amputation, Incision and Drainage left lateral foot.  . Amputation  03/22/2012    Procedure:  AMPUTATION BELOW KNEE;  Surgeon: Nadara MustardMarcus V Duda, MD;  Location: [facility] OR;  Service: Orthopedics;  Laterality: Left;  Left Below Knee Amputation  . Amputation  04/21/2012    Procedure: AMPUTATION BELOW KNEE;  Surgeon: Nadara MustardMarcus V Duda, MD;  Location: [facility] OR;  Service: Orthopedics;  Laterality: Left;  Left below knee amputation revision   Social History:  reports that he quit smoking about 32 years ago. His smoking use included Cigarettes. He has a .9 pack-year smoking history. He has never used smokeless tobacco. He reports that he drinks about 1.8 ounces of alcohol per week. He reports that he uses illicit drugs.  Allergies  Allergen Reactions  . Penicillins Other (See Comments)    Since birth    Family History  Problem Relation Age of Onset  . Diabetes Mother   . Diabetes Father   . Lupus Sister      Prior to Admission medications   Medication Sig Start Date End Date Taking? Authorizing Provider  HUMULIN 70/30 (70-30) 100 UNIT/ML injection INJECT 20 UNITS UNDER THE SKIN 2 TIMES DAILY WITH MEALS. 07/13/13  Yes Richarda OverlieNayana Abrol, MD  lisinopril (PRINIVIL,ZESTRIL) 10 MG tablet Take 10 mg by mouth daily.   Yes Historical Provider, MD  loperamide (IMODIUM) 2 MG capsule Take 1 capsule (2 mg total) by mouth as needed for diarrhea or loose stools. 06/19/13  Yes Doris Cheadleeepak Advani, MD  metoprolol tartrate (LOPRESSOR) 25 MG tablet Take 25 mg by mouth 2 (two) times daily.   Yes Historical Provider, MD  mycophenolate (MYFORTIC) 360 MG TBEC EC tablet Take 360 mg by mouth See admin instructions. Patient reports taking 720mg  in the morning and 360mg  at night   Yes Historical Provider, MD  predniSONE (DELTASONE) 5 MG tablet Take 5 mg by mouth daily.   Yes Historical Provider, MD  tacrolimus (PROGRAF) 1 MG capsule Take 4 mg by mouth 2 (two) times daily.   Yes Historical Provider, MD  azithromycin (ZITHROMAX) 500 MG tablet Take 1 tablet (500 mg total) by mouth daily. 05/11/13   Otis BraceMarjan Rabbani, MD  insulin aspart protamine-  aspart (NOVOLOG MIX 70/30) (70-30) 100 UNIT/ML injection Inject 0.2 mLs (20 Units total) into the skin 2 (two) times daily with a meal. 06/06/13   Richarda OverlieNayana Abrol, MD   Physical Exam: Filed Vitals:   07/22/13 1200  BP: 108/66  Pulse: 129  Temp:   Resp:  BP 108/66  Pulse 129  Temp(Src) 100.2 F (37.9 C) (Oral)  Resp 21  SpO2 97%  General:  Ill-appearing, toxic, lethargic however is arousable and can provide history. Eyes: PERRL, normal lids, irises & conjunctiva ENT: grossly normal hearing, dry oral mucosa Neck: no LAD, masses or thyromegaly Cardiovascular: Tachycardic, regular rate rhythm normal S1-S2 no extremity edema Telemetry: SR, tachycardia Respiratory: Patient having a few by basilar crackles, normal respiratory effort on supplemental oxygen. I do not auscultate wheezes rhonchi or rales Abdomen: soft, ntnd Skin: no rash or induration seen on limited exam Musculoskeletal: Status post below the knee amputation to his left lower extremity, right extremity no edema preserved range of motion Psychiatric: grossly normal mood and affect, speech fluent and appropriate Neurologic: grossly non-focal.          Labs on Admission:  Basic Metabolic Panel:  Recent Labs Lab 07/22/13 1020  NA 132*  K 4.7  CL 98  CO2 17*  GLUCOSE 300*  BUN 50*  CREATININE 2.20*  CALCIUM 9.0   Liver Function Tests:  Recent Labs Lab 07/22/13 1020  AST 19  ALT 25  ALKPHOS 96  BILITOT 1.1  PROT 7.0  ALBUMIN 2.9*   No results found for this basename: LIPASE, AMYLASE,  in the last 168 hours No results found for this basename: AMMONIA,  in the last 168 hours CBC:  Recent Labs Lab 07/22/13 1020  WBC 29.0*  NEUTROABS 24.7*  HGB 11.9*  HCT 36.4*  MCV 84.8  PLT 140*   Cardiac Enzymes:  Recent Labs Lab 07/22/13 1020  TROPONINI <0.30    BNP (last 3 results) No results found for this basename: PROBNP,  in the last 8760 hours CBG: No results found for this basename: GLUCAP,   in the last 168 hours  Radiological Exams on Admission: Dg Chest 2 View  07/22/2013   CLINICAL DATA:  Nausea and vomiting with body aches and chills for 3 days  EXAM: CHEST  2 VIEW  COMPARISON:  05/09/2013  FINDINGS: Heart size and vascular pattern are normal. Well-defined interstitial change in the right middle lobe is similar to prior study. No other opacities. No effusions.  IMPRESSION: No acute findings. Stable scar or discoid atelectasis right middle lobe.   Electronically Signed   By: Esperanza Heir M.D.   On: 07/22/2013 11:16    EKG: Independently reviewed. Sinus tachycardia  Assessment/Plan Active Problems:   Sepsis   HCAP (healthcare-associated pneumonia)   History of renal transplant   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 medications   Insulin dependent type 2 diabetes mellitus, uncontrolled   Hypertension   Autonomic neuropathy due to diabetes   Acute kidney injury   S/P BKA (below knee amputation)   1. Sepsis, present on admission, evidenced by hypotension, pulse of 131, development of acute renal failure with creatinine of 2.2, leukocytosis with white count of 29,000. Patient presenting with signs and symptoms consistent with pneumonia although chest x-ray not show an obvious infiltrate. He had a hospitalization back in November for community acquire pneumonia. This is compensated by his chronic immunosuppressed status with history of renal transplant. Will start broad-spectrum IV antimicrobial therapy with IV vancomycin and aztreonam, repeat chest x-ray in morning, followup on blood cultures, provide IV fluid resuscitation. Admit patient to the step down unit. 2. Suspected healthcare associated pneumonia. Patient having a hospitalization back in November at which time he was treated for a community acquired pneumonia. He presents with complaints of cough and sputum production although  chest x-ray is negative. Starting broad-spectrum IV antimicrobial therapy,  repeat a chest x-ray in a.m. Blood cultures were obtained in the emergency department. 3. Hypotension. Patient presented hypotensive with systolic blood pressures in the 80s which appeared to respond to IV fluids and stress dose steroids. He is chronically immunosuppressed on steriods, will provide hydrocortisone at 50 mg IV 3 times daily. Discontinue antihypertensive agents. 4. Acute kidney injury. Likely secondary to sepsis and volume depletion.  He presents with a creatinine of 2.2, having a creatinine of 1.38 on 06/04/2013. Provide IV fluid resuscitation. Repeat a.m. lab work 5. History of renal transplant, chronically immunosuppressed. Will continue Mycophenolate and Prograf.  6. Type 2 diabetes mellitus, poorly controlled. Presented with elevated blood sugars and 300 range. We'll continue insulin 70/3020 nasal cutaneous twice a day, slight scale coverage with Accu-Cheks q. a.c. and each bedtime. Elevated blood sugars likely secondary to underlying infectious process. 7. Hyponatremia. Patient and a sodium of 132 likely secondary to profound dehydration. Receiving IV fluid resuscitation 8. DVT prophylaxis. Heparin subcutaneous    Code Status: Full Code Disposition Plan: Admitting patient to step down unit, I anticipate he'll require greater than 2 night hospitalization  Time spent: 70 minutes  Jeralyn Bennett [facility] Pager [phone]

## 2013-07-22 NOTE — Progress Notes (Signed)
Trevor Thompson [id number] Code Status: full   Admission Data: 07/22/2013 3:56 PM Attending Provider:  Vanessa Barbara WUJ:WJXBJY, Keane Scrape, MD Consults/ Treatment Team:    Trevor Thompson is a 53 y.o. male patient admitted from ED awake, alert - oriented  X 3 - no acute distress noted.  VSS - Blood pressure 109/68, pulse 131, temperature 99.8 F (37.7 C), temperature source Oral, resp. rate 18, height 5\' 11"  (1.803 m), weight 101.515 kg (223 lb 12.8 oz), SpO2 96.00%.  no c/o shortness of breath, no c/o chest pain. Cardiac tele # [phone], in place, cardiac monitor yields:sinus tachycardia.  IV Fluids:  IV in place, occlusive dsg intact without redness, IV cath wrist left, condition patent and no redness normal saline.  Allergies:   Allergies  Allergen Reactions  . Penicillins Other (See Comments)    Since birth     Past Medical History  Diagnosis Date  . Hypertension   . PVD (peripheral vascular disease)   . S/p cadaver renal transplant   . Cold intolerance   . 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 GERD (gastroesophageal reflux disease)   . 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 Hypertriglyceridemia   . Diabetic retinopathy   . Sleep apnea     in the past .  Was cleared in 2004 to not wear CPap.  Dr in [place]  . High cholesterol   . Pneumonia 05/09/2013    "first time was today" (05/09/2013)  . Type II diabetes mellitus   . ESRD (end stage renal disease)     "no dialysis; had kidney transplant" (05/09/2013)   Medications Prior to Admission  Medication Sig Dispense Refill  . HUMULIN 70/30 (70-30) 100 UNIT/ML injection INJECT 20 UNITS UNDER THE SKIN 2 TIMES DAILY WITH MEALS.  10 mL  1  . lisinopril (PRINIVIL,ZESTRIL) 10 MG tablet Take 10 mg by mouth daily.      Marland Kitchen loperamide (IMODIUM) 2 MG capsule Take 1 capsule (2 mg total) by mouth as needed for diarrhea or loose stools.  30 capsule  1  . metoprolol tartrate (LOPRESSOR) 25 MG tablet Take 25 mg by mouth 2 (two) times daily.      .  mycophenolate (MYFORTIC) 360 MG TBEC EC tablet Take 360 mg by mouth See admin instructions. Patient reports taking 720mg  in the morning and 360mg  at night      . predniSONE (DELTASONE) 5 MG tablet Take 5 mg by mouth daily.      . tacrolimus (PROGRAF) 1 MG capsule Take 4 mg by mouth 2 (two) times daily.      Marland Kitchen azithromycin (ZITHROMAX) 500 MG tablet Take 1 tablet (500 mg total) by mouth daily.  7 tablet  0  . insulin aspart protamine- aspart (NOVOLOG MIX 70/30) (70-30) 100 UNIT/ML injection Inject 0.2 mLs (20 Units total) into the skin 2 (two) times daily with a meal.  10 mL  12   History:  obtained from the patient. Tobacco/alcohol: denied none  Orientation to room, and floor completed with information packet given to patient/family.  Patient declined safety video at this time.  Admission INP armband ID verified with patient/family, and in place.   SR up x 2, fall assessment complete, with patient and family able to verbalize understanding of risk associated with falls, and verbalized understanding to call nsg before up out of bed.  Call light within reach, patient able to voice, and demonstrate understanding.  Skin, clean-dry- intact without evidence of bruising, or skin tears.  No evidence of skin break down noted on exam.     Will cont to eval and treat per MD orders.  Orvan SeenJOHNSON, Brandis Wixted D, RN 07/22/2013 3:56 PM

## 2013-07-22 NOTE — ED Notes (Signed)
Pt states unable to void at this time. 

## 2013-07-22 NOTE — ED Notes (Signed)
Lactic Acid results shown to DR.Lockwood. ED-Lab

## 2013-07-23 ENCOUNTER — Inpatient Hospital Stay (HOSPITAL_COMMUNITY): Payer: Medicare Other

## 2013-07-23 LAB — BASIC METABOLIC PANEL
BUN: 43 mg/dL — AB (ref 6–23)
CALCIUM: 8.7 mg/dL (ref 8.4–10.5)
CO2: 17 meq/L — AB (ref 19–32)
Chloride: 106 mEq/L (ref 96–112)
Creatinine, Ser: 1.77 mg/dL — ABNORMAL HIGH (ref 0.50–1.35)
GFR calc Af Amer: 49 mL/min — ABNORMAL LOW (ref >90)
GFR calc non Af Amer: 42 mL/min — ABNORMAL LOW (ref >90)
GLUCOSE: 162 mg/dL — AB (ref 70–99)
Potassium: 5.4 mEq/L — ABNORMAL HIGH (ref 3.7–5.3)
SODIUM: 137 meq/L (ref 137–147)

## 2013-07-23 LAB — CBC
HCT: 35.8 % — ABNORMAL LOW (ref 39.0–52.0)
Hemoglobin: 11.6 g/dL — ABNORMAL LOW (ref 13.0–17.0)
MCH: 27.5 pg (ref 26.0–34.0)
MCHC: 32.4 g/dL (ref 30.0–36.0)
MCV: 84.8 fL (ref 78.0–100.0)
Platelets: 138 10*3/uL — ABNORMAL LOW (ref 150–400)
RBC: 4.22 MIL/uL (ref 4.22–5.81)
RDW: 15.5 % (ref 11.5–15.5)
WBC: 30.8 10*3/uL — ABNORMAL HIGH (ref 4.0–10.5)

## 2013-07-23 LAB — GLUCOSE, CAPILLARY
GLUCOSE-CAPILLARY: 203 mg/dL — AB (ref 70–99)
GLUCOSE-CAPILLARY: 187 mg/dL — AB (ref 70–99)
Glucose-Capillary: 221 mg/dL — ABNORMAL HIGH (ref 70–99)
Glucose-Capillary: 132 mg/dL — ABNORMAL HIGH (ref 70–99)

## 2013-07-23 MED ORDER — SODIUM CHLORIDE 0.9 % IV SOLN
INTRAVENOUS | Status: DC
  Administered 2013-07-23 – 2013-07-24 (×2): via INTRAVENOUS

## 2013-07-23 MED ORDER — SODIUM CHLORIDE 0.9 % IV BOLUS (SEPSIS)
500.0000 mL | Freq: Once | INTRAVENOUS | Status: AC
Start: 2013-07-23 — End: 2013-07-23
  Administered 2013-07-23: 500 mL via INTRAVENOUS

## 2013-07-23 MED ORDER — SODIUM CHLORIDE 0.9 % IV SOLN
INTRAVENOUS | Status: DC
  Administered 2013-07-23: 08:00:00 via INTRAVENOUS

## 2013-07-23 MED ORDER — HYDROCORTISONE SOD SUCCINATE 100 MG IJ SOLR
50.0000 mg | Freq: Two times a day (BID) | INTRAMUSCULAR | Status: DC
  Administered 2013-07-24 (×2): 50 mg via INTRAVENOUS
  Filled 2013-07-23 (×3): qty 1

## 2013-07-23 NOTE — Progress Notes (Signed)
CRITICAL VALUE ALERT  Critical value received:  Gram negative rods in aerobic blood cx bottle  Date of notification:  07/23/2012  Time of notification:  0600  Critical value read back:yes  Nurse who received alert:  L Hitt RN  MD notified (1st page):  PA Reidler  Time of first page:  0600  MD notified (2nd page):  Time of second page:  Responding MD:  Pa Reidler  Time MD responded:  [phone]  PA made aware, no new orders. Also updated on pt vital signs elevated HR in 120's. No new orders at this time. Will continue to monitor.

## 2013-07-23 NOTE — Progress Notes (Signed)
Utilization review completed.  

## 2013-07-23 NOTE — Progress Notes (Addendum)
Progress Note [facility] TEAM 1 - Stepdown/ICU TEAM   Gerlene Burdockrthur Odette ZOX:[id number] DOB: 01/25/1961 DOA: 07/22/2013 PCP: Jeanann LewandowskyJEGEDE, OLUGBEMIGA, MD  Admit HPI / Brief Narrative48: 53 y.o. 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 Patient reports having a 3 to 4 day history of cough with associated sputum production characterized as yellowish to brown, also with subjective fevers, chills, night sweats, nausea and vomiting, generalized weakness, fatigue, malaise, and feeling quite poorly. Symptoms progressively worsened and he started feeling so weak that he couldn't walk.  He presented hypotensive to the emergency department with systolic blood pressures in the 80s improved to low 100s w/ administration of IV fluids and stress dose of steroids. He was started on empiric IV antibiotic therapy with vancomycin and aztreonam. Lab work showing elevated white count of 29,000 as well as development of acute renal failure with creatinine of 2.2 and BUN of 50. Chest x-ray showed no acute findings.   HPI/Subjective: The patient is beginning to feel better today.  He denies current vomiting but does admit to some persistent nausea.  He denies abdominal cramps chest pain shortness of breath.  Assessment/Plan:  Gram-negative rod pyelonephritis Continue empiric antibiotic - assure urine culture sent - follow blood culture speciation and sensitivities  Sepsis due to above Continue current management - sepsis physiology is slowly improving  Hypotension due to above Much improved with antibiotics and volume resuscitation  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  Lowell GuitarPowell in the outpatient setting - acute failure likely simply prerenal azotemia with possible component of ATN - improving with volume resuscitation - patient is on chronic low-dose prednisone therefore stress dose steroids were initiated at presentation - slowly taper back to baseline dose  Uncontrolled diabetes mellitus type 2 Follow CBG without change in treatment today  Sleep apnea Was reportedly "cleared from using CPAP" by a former physician out of town  Code Status: FULL Family Communication: Discussed treatment plan with patient and wife at bedside Disposition Plan: SDU  Consultants: None  Procedures: None  Antibiotics: Aztreonam 1/18 >> Vancomycin 1/18 >>1/19  DVT prophylaxis: Subcutaneous heparin  Objective: Blood pressure 119/67, pulse 110, temperature 98.7 F (37.1 C), temperature source Oral, resp. rate 21, height 5\' 11"  (1.803 m), weight 101.515 kg (223 lb 12.8 oz), SpO2 90.00%.  Intake/Output Summary (Last 24 hours) at 07/23/13 1817 Last data filed at 07/23/13 1719  Gross per 24 hour  Intake   3785 ml  Output   1875 ml  Net   1910 ml   Exam: General: No acute respiratory distress Lungs: Clear to auscultation bilaterally without wheezes or crackles Cardiovascular: Tachycardic with regular rhythm without murmur gallop or rub normal S1 and S2 Abdomen: Obese, nontender, nondistended, soft, bowel sounds positive, no rebound, no ascites, no appreciable mass Extremities: No significant cyanosis, clubbing, or edema bilateral lower extremities  Data Reviewed: Basic Metabolic Panel:  Recent Labs Lab 07/22/13 1020 07/23/13 0240  NA 132* 137  K 4.7 5.4*  CL 98 106  CO2 17* 17*  GLUCOSE 300* 162*  BUN 50* 43*  CREATININE 2.20* 1.77*  CALCIUM 9.0 8.7   Liver Function Tests:  Recent Labs Lab 07/22/13 1020  AST 19  ALT 25  ALKPHOS 96  BILITOT 1.1  PROT 7.0  ALBUMIN 2.9*   CBC:  Recent Labs Lab 07/22/13 1020 07/23/13 0240  WBC 29.0* 30.8*    NEUTROABS 24.7*  --   HGB 11.9* 11.6*  HCT 36.4* 35.8*  MCV 84.8 84.8  PLT 140* 138*   Cardiac Enzymes:  Recent Labs Lab 07/22/13 1020  TROPONINI <0.30   CBG:  Recent Labs Lab 07/22/13 1729 07/22/13 2201 07/23/13 0821 07/23/13 1235 07/23/13 1715  GLUCAP 303* 161* 203* 221* 187*    Recent Results (from the past 240 hour(s))  CULTURE, BLOOD (ROUTINE X 2)     Status: None   Collection Time    07/22/13 12:25 PM      Result Value Range Status   Specimen Description BLOOD LEFT WRIST   Final   Special Requests BOTTLES DRAWN AEROBIC ONLY 10CC   Final   Culture  Setup Time     Final   Value: 07/22/2013 17:31     Performed at [facility]   Culture     Final   Value: GRAM NEGATIVE RODS     Note: Gram Stain Report Called to,Read Back By and Verified With: Leta Baptist RN on 07/23/13 at 04:50 by Christie Nottingham     Performed at [facility]   Report Status PENDING   Incomplete     Studies:  Recent x-ray studies have been reviewed in detail by the Attending Physician  Scheduled Meds:  Scheduled Meds: . aztreonam  1 g Intravenous Q8H  . heparin  5,000 Units Subcutaneous Q8H  . hydrocortisone sod succinate (SOLU-CORTEF) inj  50 mg Intravenous Q8H  . insulin aspart  0-20 Units Subcutaneous TID WC  . insulin aspart  0-5 Units Subcutaneous QHS  . insulin aspart protamine- aspart  20 Units Subcutaneous BID WC  . mycophenolate  360 mg Oral QHS  . mycophenolate  720 mg Oral Daily  . sodium chloride  3 mL Intravenous Q12H  . tacrolimus  4 mg Oral BID  . vancomycin  1,500 mg Intravenous Q24H    Time spent on care of this patient: 35 mins   [facility] T  [facility] Office  [phone] Pager - Text Page per Loretha Stapler as per below:  On-Call/Text Page:      [email or web address]      password [email or web address]  If 7PM-7AM, please contact night-coverage [email or web address] Password [email or web address] 07/23/2013, 6:17 PM   LOS: 1 day

## 2013-07-23 NOTE — Progress Notes (Signed)
Pt HR in 120's sustaining. BP 144/93 and temp 99.2 orally. Pt not complaining of any chest pain or SOB. PA Reidler made aware, will give tylenol and obtain an ekg. Will continue to monitor.

## 2013-07-24 ENCOUNTER — Inpatient Hospital Stay (HOSPITAL_COMMUNITY): Payer: Medicare Other

## 2013-07-24 DIAGNOSIS — J9600 Acute respiratory failure, unspecified whether with hypoxia or hypercapnia: Secondary | ICD-10-CM | POA: Diagnosis present

## 2013-07-24 DIAGNOSIS — N390 Urinary tract infection, site not specified: Secondary | ICD-10-CM | POA: Diagnosis present

## 2013-07-24 LAB — CBC
HCT: 35 % — ABNORMAL LOW (ref 39.0–52.0)
Hemoglobin: 11.5 g/dL — ABNORMAL LOW (ref 13.0–17.0)
MCH: 27.1 pg (ref 26.0–34.0)
MCHC: 32.9 g/dL (ref 30.0–36.0)
MCV: 82.5 fL (ref 78.0–100.0)
Platelets: 134 10*3/uL — ABNORMAL LOW (ref 150–400)
RBC: 4.24 MIL/uL (ref 4.22–5.81)
RDW: 15.6 % — ABNORMAL HIGH (ref 11.5–15.5)
WBC: 25.9 10*3/uL — ABNORMAL HIGH (ref 4.0–10.5)

## 2013-07-24 LAB — BASIC METABOLIC PANEL
BUN: 33 mg/dL — AB (ref 6–23)
CO2: 17 mEq/L — ABNORMAL LOW (ref 19–32)
CREATININE: 1.25 mg/dL (ref 0.50–1.35)
Calcium: 8.5 mg/dL (ref 8.4–10.5)
Chloride: 107 mEq/L (ref 96–112)
GFR calc Af Amer: 74 mL/min — ABNORMAL LOW (ref >90)
GFR, EST NON AFRICAN AMERICAN: 64 mL/min — AB (ref >90)
GLUCOSE: 96 mg/dL (ref 70–99)
Potassium: 3.7 mEq/L (ref 3.7–5.3)
Sodium: 137 mEq/L (ref 137–147)

## 2013-07-24 LAB — CORTISOL: CORTISOL PLASMA: 21 ug/dL

## 2013-07-24 LAB — GLUCOSE, CAPILLARY
GLUCOSE-CAPILLARY: 141 mg/dL — AB (ref 70–99)
GLUCOSE-CAPILLARY: 154 mg/dL — AB (ref 70–99)
GLUCOSE-CAPILLARY: 95 mg/dL (ref 70–99)
Glucose-Capillary: 140 mg/dL — ABNORMAL HIGH (ref 70–99)

## 2013-07-24 MED ORDER — METOPROLOL TARTRATE 12.5 MG HALF TABLET
12.5000 mg | ORAL_TABLET | Freq: Two times a day (BID) | ORAL | Status: DC
  Administered 2013-07-24: 12.5 mg via ORAL
  Filled 2013-07-24 (×2): qty 1

## 2013-07-24 MED ORDER — METOPROLOL TARTRATE 25 MG PO TABS
25.0000 mg | ORAL_TABLET | Freq: Two times a day (BID) | ORAL | Status: DC
  Administered 2013-07-24 – 2013-07-28 (×8): 25 mg via ORAL
  Filled 2013-07-24 (×9): qty 1

## 2013-07-24 MED ORDER — HYDROCORTISONE SOD SUCCINATE 100 MG IJ SOLR
50.0000 mg | Freq: Every day | INTRAMUSCULAR | Status: DC
  Administered 2013-07-25: 50 mg via INTRAVENOUS
  Filled 2013-07-24: qty 1

## 2013-07-24 MED ORDER — FUROSEMIDE 10 MG/ML IJ SOLN
40.0000 mg | Freq: Once | INTRAMUSCULAR | Status: AC
  Administered 2013-07-24: 40 mg via INTRAVENOUS
  Filled 2013-07-24: qty 4

## 2013-07-24 NOTE — Progress Notes (Signed)
Shift event: RN paged NP secondary to pt's O2 sat dropping into the high 80-90 range on normal O2 level and RN bumped him up to 6L O2 per [place] to keep O2 sats over 93%. RR in low 20s and RN says pt is not working hard to breathe. Pt has order to TF to telemetry floor. RN says pt's lungs with crackles at bases. CXR this am showed early pulmonary edema and pt's IVF were stopped. Lasix was held at that time. Creat is back down to 1.25 at last check.  Lasix 40mg  IV now. Try to wean O2 back down. RN to call if worse or is Lasix not effective and/or if not able to wean O2 down. Will keep pt on SDU for tonight given events.  Jimmye NormanKaren Kirby-Graham, NP [facility]

## 2013-07-24 NOTE — Progress Notes (Addendum)
Progress Note [place] TEAM 1 - Stepdown/ICU TEAM   Trevor Thompson ZOX:[id number] DOB: [birth year] DOA: 07/22/2013 PCP: Jeanann Lewandowsky, MD  Admit HPI / Brief Narrative: 53 y.o. 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 Patient reports having a 3 to 4 day history of cough with associated sputum production characterized as yellowish to brown, also with subjective fevers, chills, night sweats, nausea and vomiting, generalized weakness, fatigue, malaise, and feeling quite poorly. Symptoms progressively worsened and he started feeling so weak that he couldn't walk.  He presented hypotensive to the emergency department with systolic blood pressures in the 80s improved to low 100s w/ administration of IV fluids and stress dose of steroids. He was started on empiric IV antibiotic therapy with vancomycin and aztreonam. Lab work showing elevated white count of 29,000 as well as development of acute renal failure with creatinine of 2.2 and BUN of 50. Chest x-ray showed no acute findings.   HPI/Subjective: Feels weak - eating better with less nausea  Assessment/Plan:  Gram-negative rod pyelonephritis Continue empiric antibiotic - culture growing e. colil - follow sensitivities  Sepsis due to above Continue current management - sepsis physiology is slowly improving  Hypotension/ Tachycardia due to above Much improved with antibiotics and volume resuscitation - resume B Blocker   Hypoxia- acute respiratory failure - CXR suspicious of fluid overload however, it is important to note that the patient also had cough productive of green sputum upon admission and therefore may have a  pneumonia - Vanc and Azactam should cover all but atypicals - has a large cardiac silhouette on CXR-  will obtain an ECHO- hold off on Lasix but will stop IVF which have been running at 150 cc/hr  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 Lowell Guitar in the outpatient setting - acute failure likely simply prerenal azotemia with possible component of ATN - improved with volume resuscitation  - patient is on chronic low-dose prednisone therefore stress dose steroids were initiated at presentation - slowly taper back to baseline dose- will decrease Solu-medrol to once a day  Uncontrolled diabetes mellitus type 2 Follow CBG without change in treatment today  Sleep apnea Was reportedly "cleared from using CPAP" by a former physician out of town however noted to have pulse ox dropping into 70s while sleeping - Pt refusing to go back on CPAP at night- follow continuous pulse ox- may need to be send home with O2 at bedtime  Code Status: FULL Family Communication: Discussed treatment plan with patient and wife at bedside Disposition Plan: SDU  Consultants: None  Procedures: None  Antibiotics: Aztreonam 1/18 >> Vancomycin 1/18 >>1/19  DVT prophylaxis: Subcutaneous heparin  Objective: Blood pressure 132/85, pulse 107, temperature 98.8 F (37.1 C), temperature source Oral, resp. rate 16, height 5\' 11"  (1.803 m), weight 101.515 kg (223 lb 12.8 oz), SpO2 91.00%.  Intake/Output Summary (Last 24 hours) at 07/24/13 1127 Last data filed at 07/24/13 1100  Gross per 24 hour  Intake   4000 ml  Output   1600 ml  Net   2400 ml   Exam: General: No acute respiratory distress Lungs: Clear to auscultation bilaterally without wheezes or crackles Cardiovascular: Tachycardic with regular rhythm without murmur gallop or rub normal S1 and S2 Abdomen: Obese, nontender,  nondistended, soft, bowel sounds positive, no rebound, no ascites, no appreciable mass Extremities: No  significant cyanosis, clubbing, or edema bilateral lower extremities  Data Reviewed: Basic Metabolic Panel:  Recent Labs Lab 07/22/13 1020 07/23/13 0240 07/24/13 0255  NA 132* 137 137  K 4.7 5.4* 3.7  CL 98 106 107  CO2 17* 17* 17*  GLUCOSE 300* 162* 96  BUN 50* 43* 33*  CREATININE 2.20* 1.77* 1.25  CALCIUM 9.0 8.7 8.5   Liver Function Tests:  Recent Labs Lab 07/22/13 1020  AST 19  ALT 25  ALKPHOS 96  BILITOT 1.1  PROT 7.0  ALBUMIN 2.9*   CBC:  Recent Labs Lab 07/22/13 1020 07/23/13 0240 07/24/13 0255  WBC 29.0* 30.8* 25.9*  NEUTROABS 24.7*  --   --   HGB 11.9* 11.6* 11.5*  HCT 36.4* 35.8* 35.0*  MCV 84.8 84.8 82.5  PLT 140* 138* 134*   Cardiac Enzymes:  Recent Labs Lab 07/22/13 1020  TROPONINI <0.30   CBG:  Recent Labs Lab 07/23/13 0821 07/23/13 1235 07/23/13 1715 07/23/13 2126 07/24/13 0802  GLUCAP 203* 221* 187* 132* 140*    Recent Results (from the past 240 hour(s))  CULTURE, BLOOD (ROUTINE X 2)     Status: None   Collection Time    07/22/13 12:25 PM      Result Value Range Status   Specimen Description BLOOD LEFT WRIST   Final   Special Requests BOTTLES DRAWN AEROBIC ONLY 10CC   Final   Culture  Setup Time     Final   Value: 07/22/2013 17:31     Performed at [facility]   Culture     Final   Value: ESCHERICHIA COLI     Note: Gram Stain Report Called to,Read Back By and Verified With: Leta Baptist RN on 07/23/13 at 04:50 by Christie Nottingham     Performed at [facility]   Report Status PENDING   Incomplete  URINE CULTURE     Status: None   Collection Time    07/22/13  1:29 PM      Result Value Range Status   Specimen Description URINE, CLEAN CATCH   Final   Special Requests NONE   Final   Culture  Setup Time     Final   Value: 07/22/2013 15:55     Performed at [facility] Count     Final   Value: 70,000 COLONIES/ML     Performed at [facility]   Culture     Final   Value: ESCHERICHIA  COLI     Performed at [facility]   Report Status PENDING   Incomplete     Studies:  Recent x-ray studies have been reviewed in detail by the Attending Physician  Scheduled Meds:  Scheduled Meds: . aztreonam  1 g Intravenous Q8H  . heparin  5,000 Units Subcutaneous Q8H  . hydrocortisone sod succinate (SOLU-CORTEF) inj  50 mg Intravenous Q12H  . insulin aspart  0-20 Units Subcutaneous TID WC  . insulin aspart  0-5 Units Subcutaneous QHS  . insulin aspart protamine- aspart  20 Units Subcutaneous BID WC  . mycophenolate  360 mg Oral QHS  . mycophenolate  720 mg Oral Daily  . sodium chloride  3 mL Intravenous Q12H  . tacrolimus  4 mg Oral BID    Time spent on care of this patient: 35 mins   Lamya Lausch, MD  [facility] Office  [phone] Pager -  Text Page per Loretha StaplerAmion as per below:  On-Call/Text Page:      [email or web address]      password [email or web address]  If 7PM-7AM, please contact night-coverage [email or web address] Password [email or web address] 07/24/2013, 11:27 AM   LOS: 2 days

## 2013-07-24 NOTE — Care Management Note (Unsigned)
    Page 1 of 1   07/26/2013     2:42:53 PM   CARE MANAGEMENT NOTE 07/26/2013  Patient:  Trevor Thompson,Trevor Thompson   Account Number:  [id number]  Date Initiated:  07/23/2013  Documentation initiated by:  Trevor PieriniWEBSTER,KRISTI  Subjective/Objective Assessment:   Pt admitted with sepsis     Action/Plan:   PTA pt lived at home with spouse- NCM to follow for d/c needs   Anticipated DC Date:  07/27/2013   Anticipated DC Plan:  HOME/SELF CARE      DC Planning Services  CM consult      Choice offered to / List presented to:             Status of service:  In process, will continue to follow Medicare Important Message given?   (If response is "NO", the following Medicare IM given date fields will be blank) Date Medicare IM given:   Date Additional Medicare IM given:    Discharge Disposition:    Per UR Regulation:  Reviewed for med. necessity/level of care/duration of stay  If discussed at Long Length of Stay Meetings, dates discussed:    Comments:   07-06-13 1435 Trevor BambergerBrenda Graves- Bigelow, RN,BSN [phone] Pt continues on IV levaquin. On 4l 02 now. Pt may need home 02 at d/c. Will continue to monitor for disposition needs.   07/24/13- 1130- Trevor PieriniKristi Webster RN, BSN [phone] Referral received for medication needs- in to speak with pt at bedside- per conversation pt states that he has part D plan- last year his copays were high - [facility]- he states that he has changed plans for 2015 and he is now [facility] which is suppose to have lower copays- pt uses either [facility] or [facility]- since pt has part D plan he is not eligible for any assistance- no further CM needs noted at this time.

## 2013-07-25 ENCOUNTER — Inpatient Hospital Stay (HOSPITAL_COMMUNITY): Payer: Medicare Other

## 2013-07-25 LAB — URINE CULTURE

## 2013-07-25 LAB — CBC
HCT: 33.4 % — ABNORMAL LOW (ref 39.0–52.0)
Hemoglobin: 11.3 g/dL — ABNORMAL LOW (ref 13.0–17.0)
MCH: 27.7 pg (ref 26.0–34.0)
MCHC: 33.8 g/dL (ref 30.0–36.0)
MCV: 81.9 fL (ref 78.0–100.0)
PLATELETS: 133 10*3/uL — AB (ref 150–400)
RBC: 4.08 MIL/uL — ABNORMAL LOW (ref 4.22–5.81)
RDW: 15.4 % (ref 11.5–15.5)
WBC: 21.9 10*3/uL — AB (ref 4.0–10.5)

## 2013-07-25 LAB — BASIC METABOLIC PANEL
BUN: 25 mg/dL — ABNORMAL HIGH (ref 6–23)
CALCIUM: 9.1 mg/dL (ref 8.4–10.5)
CHLORIDE: 104 meq/L (ref 96–112)
CO2: 21 mEq/L (ref 19–32)
Creatinine, Ser: 1.18 mg/dL (ref 0.50–1.35)
GFR calc non Af Amer: 69 mL/min — ABNORMAL LOW (ref >90)
GFR, EST AFRICAN AMERICAN: 80 mL/min — AB (ref >90)
Glucose, Bld: 130 mg/dL — ABNORMAL HIGH (ref 70–99)
Potassium: 3.7 mEq/L (ref 3.7–5.3)
Sodium: 140 mEq/L (ref 137–147)

## 2013-07-25 LAB — GLUCOSE, CAPILLARY
GLUCOSE-CAPILLARY: 163 mg/dL — AB (ref 70–99)
Glucose-Capillary: 165 mg/dL — ABNORMAL HIGH (ref 70–99)
Glucose-Capillary: 107 mg/dL — ABNORMAL HIGH (ref 70–99)
Glucose-Capillary: 206 mg/dL — ABNORMAL HIGH (ref 70–99)

## 2013-07-25 LAB — CULTURE, BLOOD (ROUTINE X 2)

## 2013-07-25 MED ORDER — FUROSEMIDE 10 MG/ML IJ SOLN
40.0000 mg | Freq: Once | INTRAMUSCULAR | Status: AC
  Administered 2013-07-25: 40 mg via INTRAVENOUS
  Filled 2013-07-25: qty 4

## 2013-07-25 MED ORDER — PREDNISONE 5 MG PO TABS
5.0000 mg | ORAL_TABLET | Freq: Every day | ORAL | Status: DC
  Administered 2013-07-26 – 2013-07-28 (×3): 5 mg via ORAL
  Filled 2013-07-25 (×4): qty 1

## 2013-07-25 NOTE — Progress Notes (Signed)
Progress Note  TEAM 1 - Stepdown/ICU TEAM   Trevor Thompson ZOX:[id number] DOB: 07/01/1961 DOA: 07/22/2013 PCP: Jeanann Lewandowsky, MD  Admit HPI / Brief Narrative: 53 y.o. 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 Patient reports having a 3 to 4 day history of cough with associated sputum production characterized as yellowish to brown, also with subjective fevers, chills, night sweats, nausea and vomiting, generalized weakness, fatigue, malaise, and feeling quite poorly. Symptoms progressively worsened and he started feeling so weak that he couldn't walk.  He presented hypotensive to the emergency department with systolic blood pressures in the 80s improved to low 100s w/ administration of IV fluids and stress dose of steroids. He was started on empiric IV antibiotic therapy with vancomycin and aztreonam. Lab work showing elevated white count of 29,000 as well as development of acute renal failure with creatinine of 2.2 and BUN of 50. Chest x-ray showed no acute findings.   HPI/Subjective: Reports severe generalized weakness and low-grade persistent nausea.  Denies a sense of shortness of breath.  Denies chest pain or abdominal pain.  Assessment/Plan:  E coli pyelonephritis Continue directed antibiotic - culture growing pan sensitive E coli  Sepsis due to above Continue current management - sepsis physiology has markedly improved   Hypotension/ Tachycardia due to above Much improved with antibiotics and volume resuscitation - resumed B Blocker - follow trend   Acute hypoxic resp failure - possible ALI  - patient had cough productive of green sputum upon admission  and therefore may have a pneumonia - agree w/ continuing empiric abx coverage - has a large cardiac silhouette on CXR but TTE reveals preserved systolic fxn with only mod LVH and mild AoS - ALI in setting of sepsis may be at play - agree w/ minimizing fluids at this time - follow - may require Pulmonary eval/consult if does not improve soon   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 Lowell Guitar in the outpatient setting - acute failure likely simply prerenal azotemia with possible component of ATN - improved with volume resuscitation  - patient is on chronic low-dose prednisone therefore stress dose steroids were initiated at presentation - return to home steroid dose today   Uncontrolled diabetes mellitus type 2 Follow CBG without change in treatment today  Sleep apnea Was reportedly "cleared from using CPAP" by a former physician out of town however noted to have pulse ox dropping into 70s while sleeping - Pt refusing to go back on CPAP at night - follow continuous pulse ox - may need to be send home with O2 at bedtime  Code Status: FULL Family Communication: Discussed treatment plan with patient  Disposition Plan: SDU  Consultants: None  Procedures: TTE - 1/21 - moderate LVH - EF >70% - no WMA - mild AoS  Antibiotics: Aztreonam 1/18 >> Vancomycin 1/18 >>1/19  DVT prophylaxis: Subcutaneous heparin  Objective: Blood pressure 131/88, pulse 109, temperature 98.3 F (36.8 C), temperature source Oral, resp. rate 17, height 5\' 11"  (1.803 m), weight 101.515 kg (223 lb 12.8 oz), SpO2 97.00%.  Intake/Output Summary (Last 24 hours) at 07/25/13 1436 Last data filed at 07/25/13 1300  Gross per 24 hour  Intake  657.5 ml  Output   4350 ml  Net -3692.5 ml  Exam: General: No acute respiratory distress at rest  Lungs: fine diffuse crackles - no wheeze Cardiovascular: Tachycardic with regular rhythm without murmur gallop or rub Abdomen: Obese, nontender,  nondistended, soft, bowel sounds positive, no rebound, no ascites, no appreciable mass Extremities: No significant cyanosis, clubbing, or edema  Data Reviewed: Basic Metabolic Panel:  Recent Labs Lab 07/22/13 1020 07/23/13 0240 07/24/13 0255 07/25/13 0409  NA 132* 137 137 140  K 4.7 5.4* 3.7 3.7  CL 98 106 107 104  CO2 17* 17* 17* 21  GLUCOSE 300* 162* 96 130*  BUN 50* 43* 33* 25*  CREATININE 2.20* 1.77* 1.25 1.18  CALCIUM 9.0 8.7 8.5 9.1   Liver Function Tests:  Recent Labs Lab 07/22/13 1020  AST 19  ALT 25  ALKPHOS 96  BILITOT 1.1  PROT 7.0  ALBUMIN 2.9*   CBC:  Recent Labs Lab 07/22/13 1020 07/23/13 0240 07/24/13 0255 07/25/13 0409  WBC 29.0* 30.8* 25.9* 21.9*  NEUTROABS 24.7*  --   --   --   HGB 11.9* 11.6* 11.5* 11.3*  HCT 36.4* 35.8* 35.0* 33.4*  MCV 84.8 84.8 82.5 81.9  PLT 140* 138* 134* 133*   Cardiac Enzymes:  Recent Labs Lab 07/22/13 1020  TROPONINI <0.30   CBG:  Recent Labs Lab 07/24/13 1200 07/24/13 1633 07/24/13 2147 07/25/13 0803 07/25/13 1140  GLUCAP 141* 154* 95 165* 107*    Recent Results (from the past 240 hour(s))  CULTURE, BLOOD (ROUTINE X 2)     Status: None   Collection Time    07/22/13 12:25 PM      Result Value Range Status   Specimen Description BLOOD LEFT WRIST   Final   Special Requests BOTTLES DRAWN AEROBIC ONLY 10CC   Final   Culture  Setup Time     Final   Value: 07/22/2013 17:31     Performed at [facility]   Culture     Final   Value: ESCHERICHIA COLI     Note: Gram Stain Report Called to,Read Back By and Verified With: Leta BaptistLacey Hitt RN on 07/23/13 at 04:50 by Christie NottinghamAnne Skeen     Performed at [facility]   Report Status 07/25/2013 FINAL   Final   Organism ID, Bacteria ESCHERICHIA COLI   Final  URINE CULTURE     Status: None   Collection Time    07/22/13  1:29 PM      Result Value Range Status   Specimen Description URINE, CLEAN CATCH   Final   Special Requests NONE   Final   Culture   Setup Time     Final   Value: 07/22/2013 15:55     [facility]formed [facility]   Colony Count     Final   Value: 70,000 COLONIES/ML     [facility]formed at [facility]   Culture     Final   Value: ESCHERICHIA COLI     [facility]formed at [facility]   Report Status 07/25/2013 FINAL   Final   Organism ID, Bacteria ESCHERICHIA COLI   Final     Studies:  Recent x-ray studies have been reviewed in detail by the Attending Physician  Scheduled Meds:  Scheduled Meds: . aztreonam  1 g Intravenous Q8H  . heparin  5,000 Units Subcutaneous Q8H  . hydrocortisone sod succinate (SOLU-CORTEF) inj  50 mg Intravenous Daily  . insulin aspart  0-20 Units Subcutaneous TID WC  . insulin aspart  0-5 Units Subcutaneous QHS  .  insulin aspart protamine- aspart  20 Units Subcutaneous BID WC  . metoprolol tartrate  25 mg Oral BID  . mycophenolate  360 mg Oral QHS  . mycophenolate  720 mg Oral Daily  . sodium chloride  3 mL Intravenous Q12H  . tacrolimus  4 mg Oral BID    Time spent on care of this patient: 35 mins   Sandi Towe T, MD  [facility] Office  [phone] Pager - Text Page per Loretha Stapler as per below:  On-Call/Text Page:      [email or web address]      password [email or web address]  If 7PM-7AM, please contact night-coverage [email or web address] Password [email or web address] 07/25/2013, 2:36 PM   LOS: 3 days

## 2013-07-25 NOTE — Progress Notes (Signed)
Echo Lab  2D Echocardiogram completed.  Afshin Chrystal L Alin Hutchins, RDCS 07/25/2013 9:03 AM

## 2013-07-26 ENCOUNTER — Inpatient Hospital Stay (HOSPITAL_COMMUNITY): Payer: Medicare Other

## 2013-07-26 DIAGNOSIS — J9600 Acute respiratory failure, unspecified whether with hypoxia or hypercapnia: Secondary | ICD-10-CM

## 2013-07-26 LAB — GLUCOSE, CAPILLARY
GLUCOSE-CAPILLARY: 239 mg/dL — AB (ref 70–99)
Glucose-Capillary: 156 mg/dL — ABNORMAL HIGH (ref 70–99)
Glucose-Capillary: 131 mg/dL — ABNORMAL HIGH (ref 70–99)

## 2013-07-26 LAB — BASIC METABOLIC PANEL
BUN: 25 mg/dL — ABNORMAL HIGH (ref 6–23)
CHLORIDE: 102 meq/L (ref 96–112)
CO2: 25 mEq/L (ref 19–32)
CREATININE: 1.28 mg/dL (ref 0.50–1.35)
Calcium: 9 mg/dL (ref 8.4–10.5)
GFR calc non Af Amer: 62 mL/min — ABNORMAL LOW (ref >90)
GFR, EST AFRICAN AMERICAN: 72 mL/min — AB (ref >90)
Glucose, Bld: 150 mg/dL — ABNORMAL HIGH (ref 70–99)
POTASSIUM: 3.4 meq/L — AB (ref 3.7–5.3)
SODIUM: 139 meq/L (ref 137–147)

## 2013-07-26 LAB — TSH: TSH: 0.625 u[IU]/mL (ref 0.350–4.500)

## 2013-07-26 LAB — CLOSTRIDIUM DIFFICILE BY PCR: CDIFFPCR: NEGATIVE

## 2013-07-26 MED ORDER — LEVOFLOXACIN IN D5W 750 MG/150ML IV SOLN
750.0000 mg | INTRAVENOUS | Status: DC
  Filled 2013-07-26: qty 150

## 2013-07-26 MED ORDER — LEVOFLOXACIN 750 MG PO TABS
750.0000 mg | ORAL_TABLET | Freq: Every day | ORAL | Status: DC
Start: 2013-07-27 — End: 2013-07-28
  Administered 2013-07-27 – 2013-07-28 (×2): 750 mg via ORAL
  Filled 2013-07-26 (×2): qty 1

## 2013-07-26 MED ORDER — POTASSIUM CHLORIDE CRYS ER 20 MEQ PO TBCR
40.0000 meq | EXTENDED_RELEASE_TABLET | Freq: Once | ORAL | Status: AC
  Administered 2013-07-26: 40 meq via ORAL
  Filled 2013-07-26: qty 2

## 2013-07-26 NOTE — Progress Notes (Signed)
Progress Note [place] TEAM 1 - Stepdown/ICU TEAM   Trevor Thompson ZOX:[id number] DOB: 10-06-1960 DOA: 07/22/2013 PCP: Jeanann Lewandowsky, MD  Admit HPI / Brief Narrative: 53 y.o. 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 Patient reports having a 3 to 4 day history of cough with associated sputum production characterized as yellowish to brown, also with subjective fevers, chills, night sweats, nausea and vomiting, generalized weakness, fatigue, malaise, and feeling quite poorly. Symptoms progressively worsened and he started feeling so weak that he couldn't walk.  He presented hypotensive to the emergency department with systolic blood pressures in the 80s improved to low 100s w/ administration of IV fluids and stress dose of steroids. He was started on empiric IV antibiotic therapy with vancomycin and aztreonam. Lab work showing elevated white count of 29,000 as well as development of acute renal failure with creatinine of 2.2 and BUN of 50. Chest x-ray showed no acute findings.   HPI/Subjective: No complaints today. Feeling stronger.   Assessment/Plan:  E coli pyelonephritis  - culture growing pan sensitive E coli- will narrow antibiotics to Levaquin based upon sensitivities  Sepsis due to above Continue current management - sepsis physiology has markedly improved   Hypotension/ Tachycardia due to above Much improved with antibiotics and volume resuscitation - resumed B Blocker -   Acute hypoxic resp failure - possible ALI  - patient had cough productive of green sputum upon admission and therefore may have a pneumonia - agree w/ continuing empiric abx coverage - has a  large cardiac silhouette on CXR but TTE reveals preserved systolic fxn with only mod LVH and mild AoS - ALI in setting of sepsis may be at play - agree w/ minimizing fluids at this time - follow - may require Pulmonary eval/consult if does not improve soon   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 Lowell Guitar in the outpatient setting - acute failure likely simply prerenal azotemia with possible component of ATN - improved with volume resuscitation  - patient is on chronic low-dose prednisone therefore stress dose steroids were initiated at presentation - returned to home steroid dose 1/21  Uncontrolled diabetes mellitus type 2 Follow CBG without change in treatment today  Sleep apnea Was reportedly "cleared from using CPAP" by a former physician out of town however noted to have pulse ox dropping into 70s while sleeping - Pt refusing to go back on CPAP at night - follow continuous pulse ox - may need to be send home with O2 at bedtime  Code Status: FULL Family Communication: Discussed treatment plan with patient  Disposition Plan: SDU  Consultants: None  Procedures: TTE - 1/21 - moderate LVH - EF >70% - no WMA - mild AoS  Antibiotics: Aztreonam 1/18 >> Vancomycin 1/18 >>1/19  DVT prophylaxis: Subcutaneous heparin  Objective: Blood pressure 100/55, pulse 92, temperature 97.6 F (36.4 C), temperature source Oral, resp. rate 19, height 5\' 11"  (1.803 m), weight 101.515 kg (223 lb 12.8 oz), SpO2 93.00%.  Intake/Output Summary (Last 24 hours) at 07/26/13 1646 Last data filed at 07/26/13 1223  Gross per 24 hour  Intake    390 ml  Output   1075 ml  Net   -685 ml   Exam: General: No acute respiratory distress at rest  Lungs: fine diffuse crackles - no wheeze Cardiovascular: Tachycardic with regular rhythm without murmur gallop or rub Abdomen: Obese, nontender, nondistended, soft, bowel sounds positive, no rebound, no ascites, no appreciable  mass Extremities: No significant cyanosis, clubbing, or edema  Data Reviewed: Basic Metabolic Panel:  Recent Labs Lab 07/22/13 1020 07/23/13 0240 07/24/13 0255 07/25/13 0409 07/26/13 0316  NA 132* 137 137 140 139  K 4.7 5.4* 3.7 3.7 3.4*  CL 98 106 107 104 102  CO2 17* 17* 17* 21 25  GLUCOSE 300* 162* 96 130* 150*  BUN 50* 43* 33* 25* 25*  CREATININE 2.20* 1.77* 1.25 1.18 1.28  CALCIUM 9.0 8.7 8.5 9.1 9.0   Liver Function Tests:  Recent Labs Lab 07/22/13 1020  AST 19  ALT 25  ALKPHOS 96  BILITOT 1.1  PROT 7.0  ALBUMIN 2.9*   CBC:  Recent Labs Lab 07/22/13 1020 07/23/13 0240 07/24/13 0255 07/25/13 0409  WBC 29.0* 30.8* 25.9* 21.9*  NEUTROABS 24.7*  --   --   --   HGB 11.9* 11.6* 11.5* 11.3*  HCT 36.4* 35.8* 35.0* 33.4*  MCV 84.8 84.8 82.5 81.9  PLT 140* 138* 134* 133*   Cardiac Enzymes:  Recent Labs Lab 07/22/13 1020  TROPONINI <0.30   CBG:  Recent Labs Lab 07/25/13 1140 07/25/13 1530 07/25/13 2146 07/26/13 0740 07/26/13 1136  GLUCAP 107* 206* 163* 156* 131*    Recent Results (from the past 240 hour(s))  CULTURE, BLOOD (ROUTINE X 2)     Status: None   Collection Time    07/22/13 12:25 PM      Result Value Range Status   Specimen Description BLOOD LEFT WRIST   Final   Special Requests BOTTLES DRAWN AEROBIC ONLY 10CC   Final   Culture  Setup Time     Final   Value: 07/22/2013 17:31     Performed at [facility]   Culture     Final   Value: ESCHERICHIA COLI     Note: Gram Stain Report Called to,Read Back By and Verified With: Leta BaptistLacey Hitt RN on 07/23/13 at 04:50 by Christie NottinghamAnne Skeen     Performed at [facility]   Report Status 07/25/2013 FINAL   Final   Organism ID, Bacteria ESCHERICHIA COLI   Final  URINE CULTURE     Status: None   Collection Time    07/22/13  1:29 PM      Result Value Range Status   Specimen Description URINE, CLEAN CATCH   Final   Special Requests NONE   Final   Culture  Setup Time     Final   Value:  07/22/2013 15:55     [facility]formed at [facility]   Colony Count     Final   Value: 70,000 COLONIES/ML     Performed at [facility]   Culture     Final   Value: ESCHERICHIA COLI     Performed at [facility]   Report Status 07/25/2013 FINAL   Final   Organism ID, Bacteria ESCHERICHIA COLI   Final  CLOSTRIDIUM DIFFICILE BY PCR     Status: None   Collection Time    07/25/13 10:37 PM      Result Value Range Status   C difficile by pcr NEGATIVE  NEGATIVE Final     Studies:  Recent x-ray studies have been reviewed in detail by the Attending Physician  Scheduled Meds:  Scheduled Meds: . heparin  5,000 Units Subcutaneous  Q8H  . insulin aspart  0-20 Units Subcutaneous TID WC  . insulin aspart  0-5 Units Subcutaneous QHS  . insulin aspart protamine- aspart  20 Units Subcutaneous BID WC  . [START ON 07/27/2013] levofloxacin  750 mg Oral Daily  . metoprolol tartrate  25 mg Oral BID  . mycophenolate  360 mg Oral QHS  . mycophenolate  720 mg Oral Daily  . predniSONE  5 mg Oral Q breakfast  . sodium chloride  3 mL Intravenous Q12H  . tacrolimus  4 mg Oral BID    Time spent on care of this patient: 35 mins   Calvert Cantor, MD  [facility] Office  [phone] Pager - Text Page per Loretha Stapler as per below:  On-Call/Text Page:      [email or web address]      password [email or web address]  If 7PM-7AM, please contact night-coverage [email or web address] Password [facility] 07/26/2013, 4:46 PM   LOS: 4 days

## 2013-07-26 NOTE — Progress Notes (Addendum)
ANTIBIOTIC CONSULT NOTE - INITIAL  Pharmacy Consult for Levaquin (PO) Indication: E coli bacteremia  Allergies  Allergen Reactions  . Penicillins Other (See Comments)    Since birth    Patient Measurements: Height: 5\' 11"  (180.3 cm) Weight: 223 lb 12.8 oz (101.515 kg) IBW/kg (Calculated) : 75.3  Vital Signs: Temp: 97.6 F (36.4 C) (01/22 1131) Temp src: Oral (01/22 1131) BP: 100/55 mmHg (01/22 1511) Pulse Rate: 92 (01/22 1511) Intake/Output from previous day: 01/21 0701 - 01/22 0700 In: 150 [IV Piggyback:150] Out: 2525 [Urine:2525] Intake/Output from this shift: Total I/O In: 290 [P.O.:290] Out: 575 [Urine:575]  Labs:  Recent Labs  07/24/13 0255 07/25/13 0409 07/26/13 0316  WBC 25.9* 21.9*  --   HGB 11.5* 11.3*  --   PLT 134* 133*  --   CREATININE 1.25 1.18 1.28   Estimated Creatinine Clearance: 81 ml/min (by C-G formula based on Cr of 1.28). No results found for this basename: VANCOTROUGH, VANCOPEAK, VANCORANDOM, GENTTROUGH, GENTPEAK, GENTRANDOM, TOBRATROUGH, TOBRAPEAK, TOBRARND, AMIKACINPEAK, AMIKACINTROU, AMIKACIN,  in the last 72 hours   Microbiology: Recent Results (from the past 720 hour(s))  CULTURE, BLOOD (ROUTINE X 2)     Status: None   Collection Time    07/22/13 12:25 PM      Result Value Range Status   Specimen Description BLOOD LEFT WRIST   Final   Special Requests BOTTLES DRAWN AEROBIC ONLY 10CC   Final   Culture  Setup Time     Final   Value: 07/22/2013 17:31     Performed at [facility]   Culture     Final   Value: ESCHERICHIA COLI     Note: Gram Stain Report Called to,Read Back By and Verified With: Leta BaptistLacey Hitt RN on 07/23/13 at 04:50 by Christie NottinghamAnne Skeen     Performed at [facility]   Report Status 07/25/2013 FINAL   Final   Organism ID, Bacteria ESCHERICHIA COLI   Final  URINE CULTURE     Status: None   Collection Time    07/22/13  1:29 PM      Result Value Range Status   Specimen Description URINE, CLEAN CATCH   Final   Special Requests NONE   Final   Culture  Setup Time     Final   Value: 07/22/2013 15:55     Performed at [facility]   Colony Count     Final   Value: 70,000 COLONIES/ML     Performed at [facility]   Culture     Final   Value: ESCHERICHIA COLI     Performed at [facility]   Report Status 07/25/2013 FINAL   Final   Organism ID, Bacteria ESCHERICHIA COLI   Final  CLOSTRIDIUM DIFFICILE BY PCR     Status: None   Collection Time    07/25/13 10:37 PM      Result Value Range Status   C difficile by pcr NEGATIVE  NEGATIVE Final    Medical History: Past Medical History  Diagnosis Date  . Hypertension   . PVD (peripheral vascular disease)   . S/p cadaver renal transplant   . Cold intolerance   . 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 GERD (gastroesophageal reflux disease)   . 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 Hypertriglyceridemia   . Diabetic retinopathy   . Sleep apnea     in the past .  Was cleared in 2004 to not wear CPap.  [place]  in [place]  . High cholesterol   . Pneumonia 05/09/2013    "first time was today" (05/09/2013)  . Type II diabetes mellitus   . ESRD (end stage renal disease)     "no dialysis; had kidney transplant" (05/09/2013)    Assessment: 53 y/o male on who started IV Levaquin today for E coli bacteremia. Pharmacy consulted to change dose to PO. Renal function is stable. WBC trending down. Urine and blood cultures with E coli sensitive to Levaquin.  Vanc 1/18>>1/19 Aztreonam 1/18>>1/22 Levaquin 1/22>>  Goal of Therapy:  Resolution of infection  Plan:  -Levaquin 750 mg PO daily -Recommend 14 days of therapy -Pharmacy will adjust for renal function if needed -Pharmacy signing off  [facility], [place].D., BCPS Clinical Pharmacist Pager: [phone] 07/26/2013 3:53 PM

## 2013-07-27 LAB — GLUCOSE, CAPILLARY
GLUCOSE-CAPILLARY: 171 mg/dL — AB (ref 70–99)
GLUCOSE-CAPILLARY: 179 mg/dL — AB (ref 70–99)
GLUCOSE-CAPILLARY: 251 mg/dL — AB (ref 70–99)
Glucose-Capillary: 163 mg/dL — ABNORMAL HIGH (ref 70–99)
Glucose-Capillary: 174 mg/dL — ABNORMAL HIGH (ref 70–99)
Glucose-Capillary: 112 mg/dL — ABNORMAL HIGH (ref 70–99)

## 2013-07-27 LAB — BASIC METABOLIC PANEL
BUN: 24 mg/dL — ABNORMAL HIGH (ref 6–23)
CALCIUM: 8.8 mg/dL (ref 8.4–10.5)
CO2: 25 mEq/L (ref 19–32)
Chloride: 103 mEq/L (ref 96–112)
Creatinine, Ser: 1.11 mg/dL (ref 0.50–1.35)
GFR, EST AFRICAN AMERICAN: 86 mL/min — AB (ref >90)
GFR, EST NON AFRICAN AMERICAN: 74 mL/min — AB (ref >90)
GLUCOSE: 201 mg/dL — AB (ref 70–99)
POTASSIUM: 3.4 meq/L — AB (ref 3.7–5.3)
Sodium: 140 mEq/L (ref 137–147)

## 2013-07-27 LAB — CBC
HCT: 33.3 % — ABNORMAL LOW (ref 39.0–52.0)
HEMOGLOBIN: 10.9 g/dL — AB (ref 13.0–17.0)
MCH: 27 pg (ref 26.0–34.0)
MCHC: 32.7 g/dL (ref 30.0–36.0)
MCV: 82.4 fL (ref 78.0–100.0)
PLATELETS: 199 10*3/uL (ref 150–400)
RBC: 4.04 MIL/uL — ABNORMAL LOW (ref 4.22–5.81)
RDW: 15.2 % (ref 11.5–15.5)
WBC: 15.6 10*3/uL — ABNORMAL HIGH (ref 4.0–10.5)

## 2013-07-27 NOTE — Progress Notes (Signed)
Pt transferred to 6N30 per MD. Report called to receiving nurse and all questions answered.

## 2013-07-27 NOTE — Progress Notes (Signed)
Progress Note [place] TEAM 1 - Stepdown/ICU TEAM   Trevor Thompson ZOX:[id number] DOB: [birth year] DOA: 07/22/2013 PCP: Jeanann Lewandowsky, MD  Admit HPI / Brief Narrative: 53 y.o. male with multiple comorbidities including history of renal transplant, chronically immunosuppressed, insulin-dependent type 2 diabetes mellitus which is uncontrolled, autonomic neuropathy secondary to type 2 diabetes mellitus, and a hospitalization in November 2014 for community-acquired pneumonia who presented to the ED with complaints of cough. Patient reports having a 3 to 4 day history of cough with associated sputum production characterized as yellowish to brown, also with subjective fevers, chills, night sweats, nausea and vomiting, generalized weakness, fatigue, malaise, and feeling quite poorly. Symptoms progressively worsened and he started feeling so weak that he couldn't walk.  He presented hypotensive to the emergency department with systolic blood pressures in the 80s improved to low 100s w/ administration of IV fluids and stress dose of steroids. He was started on empiric IV antibiotic therapy with vancomycin and aztreonam. Lab work showing elevated white count of 29,000 as well as development of acute renal failure with creatinine of 2.2 and BUN of 50. Chest x-ray showed no acute findings.   HPI/Subjective: Feels much stronger today.  Denies sob, n/v, or abdom pain.  Appetite improving.    Assessment/Plan:  E coli pyelonephritis with bacteremia   - culture growing pan sensitive E coli - narrowed antibiotics to Levaquin - should complete a 14 day course given hx of transplant,  use of immune suppressants in a male pt, and bacteremia   Sepsis due to above Continue current management - sepsis physiology has resolved   Hypotension/ Tachycardia due to above Resolved with antibiotics and volume resuscitation - resumed B Blocker   Acute hypoxic resp failure - possible ALI  - patient had cough productive of  green sputum upon admission and therefore may have a pneumonia - agree w/ continuing empiric abx coverage - has a large cardiac silhouette on CXR but TTE revealsed preserved systolic fxn with only mod LVH and mild AoS - ALI in setting of sepsis may be at play - minimizing fluids at this time - follow - repeat CXR in AM for f/u - O2 requirement stable to modestly improved - cont to wean O2 as able (does not wear O2 at home)  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 Lowell Guitar in the outpatient setting - acute failure likely simply prerenal azotemia with possible component of ATN - improved with volume resuscitation - patient is on chronic low-dose prednisone therefore stress dose steroids were initiated at presentation - returned to home steroid dose 1/21  Uncontrolled diabetes mellitus type 2 Follow CBG without change in treatment today  Sleep apnea Was reportedly "cleared from using CPAP" by a former physician out of town however noted to have pulse ox dropping into 70s while sleeping - Pt refusing to go back on CPAP at night - follow continuous pulse ox - may need to be sent home with O2 at bedtime  Code Status: FULL Family Communication: Discussed treatment plan with patient - no family present  Disposition Plan: stable for transfer to medical bed - PT/OT - wean O2 as able   Consultants: None  Procedures: TTE - 1/21 - moderate LVH - EF >70% - no WMA - mild AoS  Antibiotics: Aztreonam 1/18 >> 1/21 Levaquin 1/23 >>  Vancomycin 1/18 >>1/19  DVT prophylaxis: Subcutaneous heparin  Objective: Blood pressure 132/83, pulse 100, temperature 97.8 F (36.6 C), temperature source Oral, resp. rate  20, height 5\' 11"  (1.803 m), weight 101.515 kg (223 lb 12.8 oz), SpO2 94.00%.  Intake/Output Summary (Last 24 hours) at 07/27/13 1414 Last data filed at 07/27/13 1200  Gross per 24 hour  Intake    720 ml  Output   1050 ml  Net   -330 ml   Exam: General: No acute  respiratory distress at rest  Lungs: fine diffuse crackles w/o change - no wheeze Cardiovascular: RRR w/o M, G, or rub  Abdomen: Obese, nontender, nondistended, soft, bowel sounds positive, no rebound, no ascites, no appreciable mass Extremities: No significant cyanosis, clubbing, or edema  Data Reviewed: Basic Metabolic Panel:  Recent Labs Lab 07/23/13 0240 07/24/13 0255 07/25/13 0409 07/26/13 0316 07/27/13 0250  NA 137 137 140 139 140  K 5.4* 3.7 3.7 3.4* 3.4*  CL 106 107 104 102 103  CO2 17* 17* 21 25 25   GLUCOSE 162* 96 130* 150* 201*  BUN 43* 33* 25* 25* 24*  CREATININE 1.77* 1.25 1.18 1.28 1.11  CALCIUM 8.7 8.5 9.1 9.0 8.8   Liver Function Tests:  Recent Labs Lab 07/22/13 1020  AST 19  ALT 25  ALKPHOS 96  BILITOT 1.1  PROT 7.0  ALBUMIN 2.9*   CBC:  Recent Labs Lab 07/22/13 1020 07/23/13 0240 07/24/13 0255 07/25/13 0409 07/27/13 0250  WBC 29.0* 30.8* 25.9* 21.9* 15.6*  NEUTROABS 24.7*  --   --   --   --   HGB 11.9* 11.6* 11.5* 11.3* 10.9*  HCT 36.4* 35.8* 35.0* 33.4* 33.3*  MCV 84.8 84.8 82.5 81.9 82.4  PLT 140* 138* 134* 133* 199   Cardiac Enzymes:  Recent Labs Lab 07/22/13 1020  TROPONINI <0.30   CBG:  Recent Labs Lab 07/26/13 1651 07/26/13 2143 07/27/13 0006 07/27/13 0807 07/27/13 1134  GLUCAP 239* 174* 112* 163* 171*    Recent Results (from the past 240 hour(s))  CULTURE, BLOOD (ROUTINE X 2)     Status: None   Collection Time    07/22/13 12:25 PM      Result Value Range Status   Specimen Description BLOOD LEFT WRIST   Final   Special Requests BOTTLES DRAWN AEROBIC ONLY 10CC   Final   Culture  Setup Time     Final   Value: 07/22/2013 17:31     Performed at [facility]   Culture     Final   Value: ESCHERICHIA COLI     Note: Gram Stain Report Called to,Read Back By and Verified With: Leta BaptistLacey Hitt RN on 07/23/13 at 04:50 by Christie NottinghamAnne Skeen     Performed at [facility]   Report Status 07/25/2013 FINAL   Final    Organism ID, Bacteria ESCHERICHIA COLI   Final  URINE CULTURE     Status: None   Collection Time    07/22/13  1:29 PM      Result Value Range Status   Specimen Description URINE, CLEAN CATCH   Final   Special Requests NONE   Final   Culture  Setup Time     Final   Value: 07/22/2013 15:55     Performed at [facility]   Colony Count     Final   Value: 70,000 COLONIES/ML     Performed at [facility]   Culture     Final   Value: ESCHERICHIA COLI     Performed at [facility]   Report Status 07/25/2013 FINAL   Final   Organism ID, Bacteria  ESCHERICHIA COLI   Final  CLOSTRIDIUM DIFFICILE BY PCR     Status: None   Collection Time    07/25/13 10:37 PM      Result Value Range Status   C difficile by pcr NEGATIVE  NEGATIVE Final  CULTURE, BLOOD (ROUTINE X 2)     Status: None   Collection Time    07/26/13  6:38 PM      Result Value Range Status   Specimen Description BLOOD LEFT ARM   Final   Special Requests BOTTLES DRAWN AEROBIC ONLY 10CC   Final   Culture  Setup Time     Final   Value: 07/26/2013 22:52     Performed at [facility]   Culture     Final   Value:        BLOOD CULTURE RECEIVED NO GROWTH TO DATE CULTURE WILL BE HELD FOR 5 DAYS BEFORE ISSUING A FINAL NEGATIVE REPORT     Performed at [facility]   Report Status PENDING   Incomplete  CULTURE, BLOOD (ROUTINE X 2)     Status: None   Collection Time    07/26/13  6:46 PM      Result Value Range Status   Specimen Description BLOOD LEFT HAND   Final   Special Requests BOTTLES DRAWN AEROBIC ONLY 4CC   Final   Culture  Setup Time     Final   Value: 07/26/2013 22:52     Performed at [facility]   Culture     Final   Value:        BLOOD CULTURE RECEIVED NO GROWTH TO DATE CULTURE WILL BE HELD FOR 5 DAYS BEFORE ISSUING A FINAL NEGATIVE REPORT     Performed at [facility]   Report Status PENDING   Incomplete     Studies:  Recent x-ray studies have been reviewed in  detail by the Attending Physician  Scheduled Meds:  Scheduled Meds: . heparin  5,000 Units Subcutaneous Q8H  . insulin aspart  0-20 Units Subcutaneous TID WC  . insulin aspart  0-5 Units Subcutaneous QHS  . insulin aspart protamine- aspart  20 Units Subcutaneous BID WC  . levofloxacin  750 mg Oral Daily  . metoprolol tartrate  25 mg Oral BID  . mycophenolate  360 mg Oral QHS  . mycophenolate  720 mg Oral Daily  . predniSONE  5 mg Oral Q breakfast  . sodium chloride  3 mL Intravenous Q12H  . tacrolimus  4 mg Oral BID    Time spent on care of this patient: 35 mins   Ashten Prats T, MD  [facility] Office  [phone] Pager - Text Page per Loretha Stapler as per below:  On-Call/Text Page:      [email or web address]      password [email or web address]  If 7PM-7AM, please contact night-coverage [email or web address] Password [email or web address] 07/27/2013, 2:14 PM   LOS: 5 days

## 2013-07-28 ENCOUNTER — Inpatient Hospital Stay (HOSPITAL_COMMUNITY): Payer: Medicare Other

## 2013-07-28 LAB — BASIC METABOLIC PANEL
BUN: 21 mg/dL (ref 6–23)
CALCIUM: 9.1 mg/dL (ref 8.4–10.5)
CO2: 25 mEq/L (ref 19–32)
Chloride: 103 mEq/L (ref 96–112)
Creatinine, Ser: 1.04 mg/dL (ref 0.50–1.35)
GFR calc Af Amer: >90 mL/min (ref >90)
GFR calc non Af Amer: 80 mL/min — ABNORMAL LOW (ref >90)
Glucose, Bld: 172 mg/dL — ABNORMAL HIGH (ref 70–99)
Potassium: 3.5 mEq/L — ABNORMAL LOW (ref 3.7–5.3)
SODIUM: 142 meq/L (ref 137–147)

## 2013-07-28 LAB — GLUCOSE, CAPILLARY
Glucose-Capillary: 179 mg/dL — ABNORMAL HIGH (ref 70–99)
Glucose-Capillary: 153 mg/dL — ABNORMAL HIGH (ref 70–99)

## 2013-07-28 MED ORDER — LEVOFLOXACIN 750 MG PO TABS: 750.0000 mg | ORAL_TABLET | Freq: Every day | ORAL | Status: AC

## 2013-07-28 NOTE — Discharge Summary (Signed)
Physician Discharge Summary  Trevor Thompson MVH:[id number] DOB: 05-14-1961 DOA: 07/22/2013  PCP: Jeanann Lewandowsky, MD  Admit date: 07/22/2013 Discharge date: 07/28/2013  Time spent: 25 minutes  Recommendations for Outpatient Follow-up:  1. Stop abx Levaquin 08/05/13  2. 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 male with multiple comorbidities including history of renal transplant, chronically immunosuppressed, insulin-dependent type 2 diabetes mellitus which is uncontrolled, autonomic neuropathy secondary to type 2 diabetes mellitus, and a hospitalization in November 2014 for community-acquired pneumonia who presented to the ED with complaints of cough. Patient reports having a 3 to 4 day history of cough with associated sputum production characterized as yellowish to brown, also with subjective fevers, chills, night sweats, nausea and vomiting, generalized weakness, fatigue, malaise, and feeling quite poorly. Symptoms progressively worsened and he started feeling so weak that he couldn't walk. He presented hypotensive to the emergency department with systolic blood pressures in the 80s improved to low 100s w/ administration of IV fluids and stress dose of steroids. He was started on empiric IV antibiotic therapy with vancomycin and aztreonam. Lab work showing elevated white count of 29,000  as well as development of acute renal failure with creatinine of 2.2 and BUN of 50. Chest x-ray showed no acute findings.    Hospital Course:   E coli pyelonephritis with bacteremia  - culture growing pan sensitive E coli - narrowed antibiotics to Levaquin - should complete a 14 day course given hx of transplant, use of immune suppressants in a male pt, and bacteremia   Sepsis due to above  Continue current management - sepsis physiology has resolved   Hypotension/ Tachycardia due to above  Resolved with antibiotics and volume resuscitation - resumed B Blocker   Acute hypoxic resp failure - possible ALI  - patient had cough productive of green sputum upon admission and therefore may have a pneumonia - agree w/ continuing empiric abx coverage  - has a large cardiac silhouette on CXR but TTE revealsed preserved systolic fxn with only mod LVH and mild AoS  - ALI in setting of sepsis may be at play - minimizing fluids at this time - O2 requirement stable to modestly improved  -O2 sats 96% RA on d/c  Acute renal failure in transplanted cadaveric kidney  Is followed by Dr. Lowell Guitar in the outpatient setting  - acute failure likely simply prerenal azotemia with possible component of ATN - improved with volume resuscitation - patient is on chronic low-dose prednisone therefore stress dose steroids were initiated at presentation  - returned to home steroid dose 1/21   Uncontrolled diabetes mellitus type 2  Follow CBG without change in treatment today   Sleep apnea  Was reportedly "cleared from using CPAP" by a former physician out of town however noted to have pulse ox dropping into 70s while sleeping -  Pt refusing to go back on CPAP  at night  - follow continuous pulse ox  - may need to be sent home with O2 at bedtime  Consultants:  None   Procedures:  TTE - 1/21 - moderate LVH - EF >70% - no WMA - mild AoS   Antibiotics:  Aztreonam 1/18 >> 1/21  Levaquin 1/23 >> 08/05/13 Vancomycin  1/18 >>1/19   DVT prophylaxis:  Subcutaneous heparin   Discharge Exam: Filed Vitals:   07/28/13 0629  BP: 149/75  Pulse: 92  Temp: 98.7 F (37.1 C)  Resp: 18    Well comfortable NO issues  General: EOMI, NCAT  Cardiovascular: s1 s 2no m/r/g Respiratory: clear  Discharge Instructions  Discharge Orders   Future Appointments Provider Department Dept Phone   08/20/2013 2:30 PM Doris Cheadleeepak Advani, MD [facility] [phone]   09/24/2013 1:30 PM Lenn SinkNorman S Regal, DPM [facility] [phone]   Future Orders Complete By Expires   Diet - low sodium heart healthy  As directed    Increase activity slowly  As directed        Medication List    STOP taking these medications       azithromycin 500 MG tablet  Commonly known as:  ZITHROMAX     loperamide 2 MG capsule  Commonly known as:  IMODIUM      TAKE these medications       HUMULIN 70/30 (70-30) 100 UNIT/ML injection  Generic drug:  insulin NPH-regular Human  INJECT 20 UNITS UNDER THE SKIN 2 TIMES DAILY WITH MEALS.     insulin aspart protamine- aspart (70-30) 100 UNIT/ML injection  Commonly known as:  NOVOLOG MIX 70/30  Inject 0.2 mLs (20 Units total) into the skin 2 (two) times daily with a meal.     levofloxacin 750 MG tablet  Commonly known as:  LEVAQUIN  Take 1 tablet (750 mg total) by mouth daily.     lisinopril 10 MG tablet  Commonly known as:  PRINIVIL,ZESTRIL  Take 10 mg by mouth daily.     metoprolol tartrate 25 MG tablet  Commonly known as:  LOPRESSOR  Take 25 mg by mouth 2 (two) times daily.     mycophenolate 360 MG Tbec EC tablet  Commonly known as:  MYFORTIC  Take 360 mg by mouth See admin instructions. Patient reports taking 720mg  in the morning and 360mg  at night     predniSONE 5 MG tablet  Commonly known as:  DELTASONE  Take 5 mg by mouth daily.     tacrolimus 1 MG capsule  Commonly known as:  PROGRAF  Take 4 mg by mouth 2 (two) times daily.        Allergies  Allergen Reactions  . Penicillins Other (See Comments)    Since birth      The results of significant diagnostics from this hospitalization (including imaging, microbiology, ancillary and laboratory) are listed below for reference.    Significant Diagnostic Studies: Dg Chest 2 View  07/23/2013   CLINICAL DATA:  Renal transplant and immunosuppression.  Sepsis.  EXAM: CHEST  2 VIEW  COMPARISON:  07/22/2013  FINDINGS: Lordotic positioning noted. Scarring in the right perihilar region is stable. No evidence of acute infiltrate or edema. No evidence of pleural effusion. Heart size remains stable.  IMPRESSION: No acute findings.   Electronically Signed   By: Myles RosenthalJohn  Stahl M.D.   On: 07/23/2013 07:55   Dg Chest 2 View  07/22/2013   CLINICAL DATA:  Nausea and vomiting  with body aches and chills for 3 days  EXAM: CHEST  2 VIEW  COMPARISON:  05/09/2013  FINDINGS: Heart size and vascular pattern are normal. Well-defined interstitial change in the right middle lobe is similar to prior study. No other opacities. No effusions.  IMPRESSION: No acute findings. Stable scar or discoid atelectasis right middle lobe.   Electronically Signed   By: Esperanza Heir M.D.   On: 07/22/2013 11:16   Dg Chest Port 1 View  07/28/2013   CLINICAL DATA:  Pulmonary infiltrates.  EXAM: PORTABLE CHEST - 1 VIEW  COMPARISON:  07/26/2013 and 07/25/2013 and 05/09/2013  FINDINGS: The bibasilar infiltrates have almost completely resolved with minimal residual consolidation at the left base posterior medially and minimal linear atelectasis in the right midzone. Lungs are otherwise clear. Heart size and vascularity are normal. No osseous abnormality.  IMPRESSION: Almost complete resolution of the pulmonary infiltrates.   Electronically Signed   By: Geanie Cooley M.D.   On: 07/28/2013 08:25   Dg Chest Port 1 View  07/26/2013   CLINICAL DATA:  Followup infiltrate versus edema  EXAM: PORTABLE CHEST - 1 VIEW  COMPARISON:   07/25/2013  FINDINGS: Cardiac enlargement. Mild improvement in right upper lobe airspace disease which may represent edema. Bibasilar atelectasis remains. Small pleural effusions.  IMPRESSION: Improvement in right upper lobe airspace disease, most likely edema. Bibasilar atelectasis remains.   Electronically Signed   By: Marlan Palau M.D.   On: 07/26/2013 07:52   Dg Chest Port 1 View  07/25/2013   CLINICAL DATA:  Desaturation, pulmonary edema  EXAM: PORTABLE CHEST - 1 VIEW  COMPARISON:  07/24/2013; 07/22/2013; 05/09/2013  FINDINGS: Grossly unchanged enlarged cardiac silhouette and mediastinal contours with atherosclerotic plaque within the thoracic aorta. Lung volumes remain reduced grossly unchanged perihilar and medial basilar heterogeneous opacities, left greater than right. Pulmonary vasculature remains indistinct. Trace bilateral effusions are not excluded. No pneumothorax. Unchanged bones.  IMPRESSION: Grossly unchanged findings of hypoventilation, cardiomegaly, pulmonary edema and perihilar/bibasilar opacities, atelectasis versus infiltrate. Further evaluation with a PA and lateral chest radiograph may be obtained as clinically indicated.   Electronically Signed   By: Simonne Come M.D.   On: 07/25/2013 07:39   Dg Chest Port 1 View  07/24/2013   CLINICAL DATA:  Hydration, query edema/infiltrates.  EXAM: PORTABLE CHEST - 1 VIEW  COMPARISON:  DG CHEST 2 VIEW dated 07/23/2013; DG CHEST 2 VIEW dated 07/22/2013; DG CHEST 2 VIEW dated 05/09/2013; DG CHEST 2 VIEW dated 03/20/2012  FINDINGS: Stable mild cardiomegaly with atherosclerotic calcification of the aortic arch.  Low lung volumes are present, causing crowding of the pulmonary vasculature. Perihilar interstitial and patchy airspace opacity noted, increased from prior exam.  Stable elevated right hemidiaphragm.  IMPRESSION: 1. Cardiomegaly with perihilar indistinct interstitial and patchy airspace opacities, favoring early pulmonary edema. 2. Atherosclerotic  aorta. 3. Mildly elevated right hemidiaphragm.   Electronically Signed   By: Herbie Baltimore M.D.   On: 07/24/2013 07:31    Microbiology: Recent Results (from the past 240 hour(s))  CULTURE, BLOOD (ROUTINE X 2)     Status: None   Collection Time    07/22/13 12:25 PM      Result Value Range Status   Specimen Description BLOOD LEFT WRIST   Final   Special Requests BOTTLES DRAWN AEROBIC ONLY 10CC   Final   Culture  Setup Time     Final   Value: 07/22/2013 17:31     Performed at [facility]  Culture     Final   Value: ESCHERICHIA COLI     Note: Gram Stain Report Called to,Read Back By and Verified With: Leta Baptist RN on 07/23/13 at 04:50 by Christie Nottingham     Performed at [facility]   Report Status 07/25/2013 FINAL   Final   Organism ID, Bacteria ESCHERICHIA COLI   Final  URINE CULTURE     Status: None   Collection Time    07/22/13  1:29 PM      Result Value Range Status   Specimen Description URINE, CLEAN CATCH   Final   Special Requests NONE   Final   Culture  Setup Time     Final   Value: 07/22/2013 15:55     Performed at [facility] Count     Final   Value: 70,000 COLONIES/ML     Performed at [facility]   Culture     Final   Value: ESCHERICHIA COLI     Performed at [facility]   Report Status 07/25/2013 FINAL   Final   Organism ID, Bacteria ESCHERICHIA COLI   Final  CLOSTRIDIUM DIFFICILE BY PCR     Status: None   Collection Time    07/25/13 10:37 PM      Result Value Range Status   C difficile by pcr NEGATIVE  NEGATIVE Final  CULTURE, BLOOD (ROUTINE X 2)     Status: None   Collection Time    07/26/13  6:38 PM      Result Value Range Status   Specimen Description BLOOD LEFT ARM   Final   Special Requests BOTTLES DRAWN AEROBIC ONLY 10CC   Final   Culture  Setup Time     Final   Value: 07/26/2013 22:52     Performed at [facility]   Culture     Final   Value:        BLOOD CULTURE RECEIVED NO GROWTH TO DATE  CULTURE WILL BE HELD FOR 5 DAYS BEFORE ISSUING A FINAL NEGATIVE REPORT     Performed at [facility]   Report Status PENDING   Incomplete  CULTURE, BLOOD (ROUTINE X 2)     Status: None   Collection Time    07/26/13  6:46 PM      Result Value Range Status   Specimen Description BLOOD LEFT HAND   Final   Special Requests BOTTLES DRAWN AEROBIC ONLY 4CC   Final   Culture  Setup Time     Final   Value: 07/26/2013 22:52     Performed at [facility]   Culture     Final   Value:        BLOOD CULTURE RECEIVED NO GROWTH TO DATE CULTURE WILL BE HELD FOR 5 DAYS BEFORE ISSUING A FINAL NEGATIVE REPORT     Performed at [facility]   Report Status PENDING   Incomplete     Labs: Basic Metabolic Panel:  Recent Labs Lab 07/24/13 0255 07/25/13 0409 07/26/13 0316 07/27/13 0250 07/28/13 0525  NA 137 140 139 140 142  K 3.7 3.7 3.4* 3.4* 3.5*  CL 107 104 102 103 103  CO2 17* 21 25 25 25   GLUCOSE 96 130* 150* 201* 172*  BUN 33* 25* 25* 24* 21  CREATININE 1.25 1.18 1.28 1.11 1.04  CALCIUM 8.5 9.1 9.0 8.8 9.1   Liver Function Tests:  Recent Labs Lab 07/22/13 1020  AST  19  ALT 25  ALKPHOS 96  BILITOT 1.1  PROT 7.0  ALBUMIN 2.9*   No results found for this basename: LIPASE, AMYLASE,  in the last 168 hours No results found for this basename: AMMONIA,  in the last 168 hours CBC:  Recent Labs Lab 07/22/13 1020 07/23/13 0240 07/24/13 0255 07/25/13 0409 07/27/13 0250  WBC 29.0* 30.8* 25.9* 21.9* 15.6*  NEUTROABS 24.7*  --   --   --   --   HGB 11.9* 11.6* 11.5* 11.3* 10.9*  HCT 36.4* 35.8* 35.0* 33.4* 33.3*  MCV 84.8 84.8 82.5 81.9 82.4  PLT 140* 138* 134* 133* 199   Cardiac Enzymes:  Recent Labs Lab 07/22/13 1020  TROPONINI <0.30   BNP: BNP (last 3 results) No results found for this basename: PROBNP,  in the last 8760 hours CBG:  Recent Labs Lab 07/27/13 1134 07/27/13 1648 07/27/13 2127 07/28/13 0809 07/28/13 1148  GLUCAP 171* 179* 251*  179* 153*       Signed:  Rhetta Mura  [facility] 07/28/2013, 1:39 PM

## 2013-07-28 NOTE — Discharge Planning (Signed)
Copy of home instructions to pt who verbalizes understanding.  D'cd per w/c to private car home with all personal belongings at 1505.

## 2013-07-28 NOTE — Progress Notes (Signed)
PT Cancellation Note  Patient Details Name: Trevor Thompson MRN: [id number] DOB: 01/10/1961   Cancelled Treatment:    Reason Eval/Treat Not Completed: PT screened, no needs identified, will sign off.  Patient sitting on EOB ready for discharge.  Reports he has been ambulating with prosthesis without difficulty.  No acute PT needs identified - PT will sign off.   Vena AustriaDavis, Julia Kulzer H 07/28/2013, 3:33 PM Durenda HurtSusan H. Renaldo Fiddleravis, PT, [facility] Pager [phone]

## 2013-08-01 LAB — CULTURE, BLOOD (ROUTINE X 2)
CULTURE: NO GROWTH
Culture: NO GROWTH

## 2013-08-20 ENCOUNTER — Inpatient Hospital Stay: Payer: Medicare Other | Admitting: Internal Medicine

## 2013-08-24 ENCOUNTER — Inpatient Hospital Stay: Payer: Medicare Other

## 2013-09-07 ENCOUNTER — Ambulatory Visit: Payer: Medicare Other | Attending: Internal Medicine | Admitting: Internal Medicine

## 2013-09-07 ENCOUNTER — Encounter: Payer: Self-pay | Admitting: Internal Medicine

## 2013-09-07 VITALS — BP 113/77 | HR 111 | Temp 98.5°F | Resp 14 | Ht 71.0 in | Wt 233.8 lb

## 2013-09-07 DIAGNOSIS — Z794 Long term (current) use of insulin: Secondary | ICD-10-CM | POA: Insufficient documentation

## 2013-09-07 DIAGNOSIS — E1165 Type 2 diabetes mellitus with hyperglycemia: Secondary | ICD-10-CM

## 2013-09-07 DIAGNOSIS — IMO0001 Reserved for inherently not codable concepts without codable children: Secondary | ICD-10-CM

## 2013-09-07 DIAGNOSIS — Z91199 Patient's noncompliance with other medical treatment and regimen due to unspecified reason: Secondary | ICD-10-CM | POA: Insufficient documentation

## 2013-09-07 DIAGNOSIS — IMO0002 Reserved for concepts with insufficient information to code with codable children: Secondary | ICD-10-CM

## 2013-09-07 DIAGNOSIS — Z09 Encounter for follow-up examination after completed treatment for conditions other than malignant neoplasm: Secondary | ICD-10-CM | POA: Insufficient documentation

## 2013-09-07 DIAGNOSIS — K219 Gastro-esophageal reflux disease without esophagitis: Secondary | ICD-10-CM | POA: Insufficient documentation

## 2013-09-07 DIAGNOSIS — Z940 Kidney transplant status: Secondary | ICD-10-CM | POA: Insufficient documentation

## 2013-09-07 DIAGNOSIS — Z9119 Patient's noncompliance with other medical treatment and regimen: Secondary | ICD-10-CM | POA: Insufficient documentation

## 2013-09-07 DIAGNOSIS — I10 Essential (primary) hypertension: Secondary | ICD-10-CM | POA: Insufficient documentation

## 2013-09-07 LAB — COMPLETE METABOLIC PANEL WITH GFR
ALT: 17 U/L (ref 0–53)
AST: 15 U/L (ref 0–37)
Albumin: 3.8 g/dL (ref 3.5–5.2)
Alkaline Phosphatase: 70 U/L (ref 39–117)
BILIRUBIN TOTAL: 0.4 mg/dL (ref 0.2–1.2)
BUN: 29 mg/dL — ABNORMAL HIGH (ref 6–23)
CALCIUM: 9.8 mg/dL (ref 8.4–10.5)
CHLORIDE: 107 meq/L (ref 96–112)
CO2: 25 meq/L (ref 19–32)
CREATININE: 1.29 mg/dL (ref 0.50–1.35)
GFR, EST NON AFRICAN AMERICAN: 63 mL/min
GFR, Est African American: 73 mL/min
GLUCOSE: 112 mg/dL — AB (ref 70–99)
Potassium: 4.8 mEq/L (ref 3.5–5.3)
Sodium: 139 mEq/L (ref 135–145)
Total Protein: 6.9 g/dL (ref 6.0–8.3)

## 2013-09-07 LAB — CBC WITH DIFFERENTIAL/PLATELET
BASOS ABS: 0 10*3/uL (ref 0.0–0.1)
BASOS PCT: 0 % (ref 0–1)
EOS ABS: 0.2 10*3/uL (ref 0.0–0.7)
EOS PCT: 3 % (ref 0–5)
HCT: 40 % (ref 39.0–52.0)
Hemoglobin: 12.4 g/dL — ABNORMAL LOW (ref 13.0–17.0)
LYMPHS ABS: 2.2 10*3/uL (ref 0.7–4.0)
Lymphocytes Relative: 27 % (ref 12–46)
MCH: 26.6 pg (ref 26.0–34.0)
MCHC: 31 g/dL (ref 30.0–36.0)
MCV: 85.8 fL (ref 78.0–100.0)
Monocytes Absolute: 1 10*3/uL (ref 0.1–1.0)
Monocytes Relative: 12 % (ref 3–12)
NEUTROS PCT: 58 % (ref 43–77)
Neutro Abs: 4.7 10*3/uL (ref 1.7–7.7)
PLATELETS: 208 10*3/uL (ref 150–400)
RBC: 4.66 MIL/uL (ref 4.22–5.81)
RDW: 15.2 % (ref 11.5–15.5)
WBC: 8.1 10*3/uL (ref 4.0–10.5)

## 2013-09-07 LAB — POCT URINALYSIS DIPSTICK
Bilirubin, UA: NEGATIVE
GLUCOSE UA: 100
Ketones, UA: NEGATIVE
Leukocytes, UA: NEGATIVE
NITRITE UA: NEGATIVE
PROTEIN UA: 300
Spec Grav, UA: 1.025
Urobilinogen, UA: 0.2
pH, UA: 5.5

## 2013-09-07 LAB — GLUCOSE, POCT (MANUAL RESULT ENTRY): POC Glucose: 132 mg/dl — AB (ref 70–99)

## 2013-09-07 LAB — POCT GLYCOSYLATED HEMOGLOBIN (HGB A1C): HEMOGLOBIN A1C: 6.8

## 2013-09-07 MED ORDER — METOPROLOL TARTRATE 25 MG PO TABS: 25.0000 mg | ORAL_TABLET | Freq: Two times a day (BID) | ORAL | Status: DC

## 2013-09-07 MED ORDER — LISINOPRIL 10 MG PO TABS: 10.0000 mg | ORAL_TABLET | Freq: Every day | ORAL | Status: DC

## 2013-09-07 NOTE — Progress Notes (Signed)
Patient ID: Trevor Thompson, male   DOB: 10/26/1960, 53 y.o.   MRN: [id number]   CC:  HPI: 53 year old male here for hospital followup. The patient has a history of hypertension, cataract renal transplant, uncontrolled diabetes, patient was found to have Escherichia coli pyelonephritis with bacteremia and sepsis discharged on 14 days of levofloxacin that he could not afford, and hence did not take it. Today he fortunately does not report any symptoms at all  He does complain of fluctuating CBGs ranging from 70-300. He feels dizzy with this CBGs close to 90    Allergies  Allergen Reactions  . Penicillins Other (See Comments)    Since birth   Past Medical History  Diagnosis Date  . Hypertension   . PVD (peripheral vascular disease)   . S/p cadaver renal transplant   . Cold intolerance   . 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 GERD (gastroesophageal reflux disease)   . 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 Hypertriglyceridemia   . Diabetic retinopathy   . Sleep apnea     in the past .  Was cleared in 2004 to not wear CPap.  [place] in [place]  . High cholesterol   . Pneumonia 05/09/2013    "first time was today" (05/09/2013)  . Type II diabetes mellitus   . ESRD (end stage renal disease)     "no dialysis; had kidney transplant" (05/09/2013)   Current Outpatient Prescriptions on File Prior to Visit  Medication Sig Dispense Refill  . insulin aspart protamine- aspart (NOVOLOG MIX 70/30) (70-30) 100 UNIT/ML injection Inject 0.2 mLs (20 Units total) into the skin 2 (two) times daily with a meal.  10 mL  12  . mycophenolate (MYFORTIC) 360 MG TBEC EC tablet Take 360 mg by mouth See admin instructions. Patient reports taking 720mg  in the morning and 360mg  at night      . predniSONE (DELTASONE) 5 MG tablet Take 5 mg by mouth daily.      . tacrolimus (PROGRAF) 1 MG capsule Take 4 mg by mouth 2 (two) times daily.       No current facility-administered medications on file prior to visit.    Family History  Problem Relation Age of Onset  . Diabetes Mother   . Diabetes Father   . Lupus Sister    History   Social History  . Marital Status: Married    Spouse Name: N/A    Number of Children: N/A  . Years of Education: N/A   Occupational History  . Not on file.   Social History Main Topics  . Smoking status: Former Smoker -- 0.30 packs/day for 3 years    Types: Cigarettes    Quit date: 07/05/1981  . Smokeless tobacco: Never Used  . Alcohol Use: 1.8 oz/week    3 Cans of beer per week  . Drug Use: Yes     Comment: 05/09/2013 "smoked pot in the 1970's; tried it again ~ 2010; stopped again"  . Sexual Activity: Yes   Other Topics Concern  . Not on file   Social History Narrative   Pt is married and traveling thru [place]    Review of Systems  Constitutional: Negative for fever, chills, diaphoresis, activity change, appetite change and fatigue.  HENT: Negative for ear pain, nosebleeds, congestion, facial swelling, rhinorrhea, neck pain, neck stiffness and ear discharge.   Eyes: Negative for pain, discharge, redness, itching and visual disturbance.  Respiratory: Negative for cough, choking, chest tightness, shortness of breath, wheezing  and stridor.   Cardiovascular: Negative for chest pain, palpitations and leg swelling.  Gastrointestinal: Negative for abdominal distention.  Genitourinary: Negative for dysuria, urgency, frequency, hematuria, flank pain, decreased urine volume, difficulty urinating and dyspareunia.  Musculoskeletal: Negative for back pain, joint swelling, arthralgias and gait problem.  Neurological: Negative for dizziness, tremors, seizures, syncope, facial asymmetry, speech difficulty, weakness, light-headedness, numbness and headaches.  Hematological: Negative for adenopathy. Does not bruise/bleed easily.  Psychiatric/Behavioral: Negative for hallucinations, behavioral problems, confusion, dysphoric mood, decreased concentration and agitation.     Objective:   Filed Vitals:   09/07/13 1218  BP: 113/77  Pulse: 111  Temp: 98.5 F (36.9 C)  Resp: 14    Physical Exam  Constitutional: Appears well-developed and well-nourished. No distress.  HENT: Normocephalic. External right and left ear normal. Oropharynx is clear and moist.  Eyes: Conjunctivae and EOM are normal. PERRLA, no scleral icterus.  Neck: Normal ROM. Neck supple. No JVD. No tracheal deviation. No thyromegaly.  CVS: RRR, S1/S2 +, no murmurs, no gallops, no carotid bruit.  Pulmonary: Effort and breath sounds normal, no stridor, rhonchi, wheezes, rales.  Abdominal: Soft. BS +,  no distension, tenderness, rebound or guarding.  Musculoskeletal: Normal range of motion. No edema and no tenderness.  Lymphadenopathy: No lymphadenopathy noted, cervical, inguinal. Neuro: Alert. Normal reflexes, muscle tone coordination. No cranial nerve deficit. Skin: Skin is warm and dry. No rash noted. Not diaphoretic. No erythema. No pallor.  Psychiatric: Normal mood and affect. Behavior, judgment, thought content normal.   Lab Results  Component Value Date   WBC 15.6* 07/27/2013   HGB 10.9* 07/27/2013   HCT 33.3* 07/27/2013   MCV 82.4 07/27/2013   PLT 199 07/27/2013   Lab Results  Component Value Date   CREATININE 1.04 07/28/2013   BUN 21 07/28/2013   NA 142 07/28/2013   K 3.5* 07/28/2013   CL 103 07/28/2013   CO2 25 07/28/2013    Lab Results  Component Value Date   HGBA1C 13.7* 05/09/2013   Lipid Panel     Component Value Date/Time   CHOL 131 02/22/2012 0442   TRIG 128 02/22/2012 0442   HDL 64 02/22/2012 0442   CHOLHDL 2.0 02/22/2012 0442   VLDL 26 02/22/2012 0442   LDLCALC 41 02/22/2012 0442       Assessment and plan:   Patient Active Problem List   Diagnosis Date Noted  . UTI (urinary tract infection) 07/24/2013  . Acute respiratory failure 07/24/2013  . HCAP (healthcare-associated pneumonia) 07/22/2013  . Sepsis 05/09/2013  . CAP (community acquired pneumonia)  05/09/2013  . S/P BKA (below knee amputation) 03/24/2012  . Fever 02/28/2012  . 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 Acute kidney injury 02/27/2012  . Leukocytosis 02/26/2012  . 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 Cellulitis of left foot 02/21/2012  . Necrotic toe 02/21/2012  .  ? PVD (peripheral vascular disease) 02/21/2012  . History of renal transplant 02/21/2012  . 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 Insulin dependent type 2 diabetes mellitus, uncontrolled 02/21/2012  . HTN (hypertension) 02/21/2012  . Dehydration as evidenced by hemoconcentration 02/21/2012  . Left foot pain 02/21/2012  . Type 1 diabetes mellitus, uncontrolled   . Hypertension   . S/p cadaver renal transplant   . Cold intolerance   . 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 GERD (gastroesophageal reflux disease)   . 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 Hypertriglyceridemia   . Diabetic retinopathy  Diabetes Check A1c CBG 1 32 Ophthalmology followup Will schedule patient with diabetes education due to noncompliance May need endocrinology referral for uncontrolled diabetes   Status post renal transplant Stable kidney function Followup with Dr. Lowell GuitarPowell  Diabetes check in 3-4 weeks otherwise follow up in one to 2 months months      The patient was given clear instructions to go to ER or return to medical center if symptoms don't improve, worsen or new problems develop. The patient verbalized understanding. The patient was told to call to get any lab results if not heard anything in the next week.

## 2013-09-07 NOTE — Progress Notes (Signed)
Patient is here for a hospital follow up. Patient is a diabetic and suffers from hypertension. Patient's pulse is 111. Patient complains of high level of stress.

## 2013-09-10 ENCOUNTER — Ambulatory Visit: Payer: Medicare Other | Admitting: Home Health Services

## 2013-09-11 ENCOUNTER — Telehealth: Payer: Self-pay | Admitting: Emergency Medicine

## 2013-09-11 NOTE — Telephone Encounter (Signed)
Left message for pt to call for lab results 

## 2013-09-11 NOTE — Telephone Encounter (Signed)
Message copied by Darlis LoanSMITH, JILL D on Tue Sep 11, 2013  5:23 PM ------      Message from: Susie CassetteABROL MD, [facility]      Created: Tue Sep 11, 2013  2:10 PM       Notify patient of the patient's labs and A1c are stable ------

## 2013-09-13 ENCOUNTER — Ambulatory Visit: Payer: Medicare Other | Admitting: Dietician

## 2013-09-24 ENCOUNTER — Ambulatory Visit (INDEPENDENT_AMBULATORY_CARE_PROVIDER_SITE_OTHER): Payer: Medicare Other | Admitting: Podiatry

## 2013-09-24 ENCOUNTER — Encounter: Payer: Self-pay | Admitting: Podiatry

## 2013-09-24 VITALS — BP 140/67 | HR 105 | Resp 16

## 2013-09-24 DIAGNOSIS — M79609 Pain in unspecified limb: Secondary | ICD-10-CM

## 2013-09-24 DIAGNOSIS — B351 Tinea unguium: Secondary | ICD-10-CM

## 2013-09-25 ENCOUNTER — Encounter: Payer: Medicare Other | Admitting: Pharmacist

## 2013-09-25 NOTE — Progress Notes (Signed)
Subjective:     Patient ID: Trevor Thompson, male   DOB: 05/31/1961, 53 y.o.   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

## 2013-12-27 ENCOUNTER — Ambulatory Visit: Payer: Medicare Other | Admitting: Podiatry

## 2014-04-29 ENCOUNTER — Ambulatory Visit (INDEPENDENT_AMBULATORY_CARE_PROVIDER_SITE_OTHER): Payer: Medicare Other | Admitting: Podiatry

## 2014-04-29 DIAGNOSIS — B351 Tinea unguium: Secondary | ICD-10-CM

## 2014-04-29 DIAGNOSIS — Q828 Other specified congenital malformations of skin: Secondary | ICD-10-CM | POA: Diagnosis not present

## 2014-04-29 DIAGNOSIS — M79673 Pain in unspecified foot: Secondary | ICD-10-CM | POA: Diagnosis not present

## 2014-04-29 DIAGNOSIS — E1151 Type 2 diabetes mellitus with diabetic peripheral angiopathy without gangrene: Secondary | ICD-10-CM | POA: Diagnosis not present

## 2014-04-29 NOTE — Progress Notes (Signed)
   Subjective:    Patient ID: Trevor Thompson, male    DOB: 02/25/1961, 53 y.o.   MRN: [id number]  HPI Pt presents for nail debridement  Review of Systems     Objective:   Physical Exam        Assessment & Plan:

## 2014-04-29 NOTE — Progress Notes (Signed)
Subjective:     Patient ID: Trevor Thompson, male   DOB: 06/24/1961, 53 y.o.   MRN: [id number]  HPI patient presents with nail disease 1-5 both feet and keratotic lesion sub-first metatarsal and hallux of both feet that are painful when pressed with at risk medical condition and diabetes   Review of Systems     Objective:   Physical Exam Neurovascular status is found to be diminished with thick yellow nailbeds 1-5 both feet that are painful when pressed and lesions that are painful when pressed on the first metatarsal and hallux of both feet    Assessment:     At risk diabetic with mycotic nail infection and lesion formation bilateral    Plan:     Debrided nailbeds 1-5 of both feet in lesions on both feet with no iatrogenic bleeding noted

## 2014-06-13 ENCOUNTER — Encounter (HOSPITAL_COMMUNITY): Payer: Self-pay | Admitting: Surgery

## 2014-08-05 ENCOUNTER — Ambulatory Visit (INDEPENDENT_AMBULATORY_CARE_PROVIDER_SITE_OTHER): Payer: Medicare Other | Admitting: Podiatry

## 2014-08-05 DIAGNOSIS — M79673 Pain in unspecified foot: Secondary | ICD-10-CM

## 2014-08-05 DIAGNOSIS — B351 Tinea unguium: Secondary | ICD-10-CM

## 2014-08-05 NOTE — Progress Notes (Signed)
Subjective:     Patient ID: Trevor Thompson, male   DOB: 02/26/1961, 54 y.o.   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 Patient does have vascular disease and long-term diabetes   Review of Systems     Objective:   Physical Exam Thick brittle nailbeds 1-5 of the right foot which are painful with numerous at risk condition    Assessment:     Painful mycotic nails 1-5 right    Plan:     Debridement nailbeds 1-5 right with no iatrogenic bleeding noted

## 2014-08-05 NOTE — Progress Notes (Deleted)
Subjective:     Patient ID: Trevor Thompson, male   DOB: 01/29/1961, 53 y.o.   MRN: [id number]  HPI patient presents with nail disease 1-5 both feet and keratotic lesion sub-first metatarsal and hallux of both feet that are painful when pressed with at risk medical condition and diabetes   Review of Systems     Objective:   Physical Exam Neurovascular status is found to be diminished with thick yellow nailbeds 1-5 both feet that are painful when pressed and lesions that are painful when pressed on the first metatarsal and hallux of both feet    Assessment:     At risk diabetic with mycotic nail infection and lesion formation bilateral    Plan:     Debrided nailbeds 1-5 of both feet in lesions on both feet with no iatrogenic bleeding noted      

## 2014-08-12 ENCOUNTER — Encounter (HOSPITAL_COMMUNITY): Payer: Self-pay | Admitting: Emergency Medicine

## 2014-08-12 ENCOUNTER — Emergency Department (HOSPITAL_COMMUNITY): Payer: Medicare Other

## 2014-08-12 ENCOUNTER — Observation Stay (HOSPITAL_COMMUNITY)
Admission: EM | Admit: 2014-08-12 | Discharge: 2014-08-13 | Disposition: A | Discharge status: Home / Self Care | Payer: Medicare Other | Attending: Internal Medicine | Admitting: Internal Medicine

## 2014-08-12 DIAGNOSIS — I739 Peripheral vascular disease, unspecified: Secondary | ICD-10-CM | POA: Diagnosis not present

## 2014-08-12 DIAGNOSIS — E871 Hypo-osmolality and hyponatremia: Secondary | ICD-10-CM | POA: Insufficient documentation

## 2014-08-12 DIAGNOSIS — K219 Gastro-esophageal reflux disease without esophagitis: Secondary | ICD-10-CM | POA: Diagnosis not present

## 2014-08-12 DIAGNOSIS — N179 Acute kidney failure, unspecified: Secondary | ICD-10-CM | POA: Diagnosis not present

## 2014-08-12 DIAGNOSIS — I10 Essential (primary) hypertension: Secondary | ICD-10-CM | POA: Diagnosis present

## 2014-08-12 DIAGNOSIS — Z7952 Long term (current) use of systemic steroids: Secondary | ICD-10-CM | POA: Insufficient documentation

## 2014-08-12 DIAGNOSIS — Z89512 Acquired absence of left leg below knee: Secondary | ICD-10-CM | POA: Insufficient documentation

## 2014-08-12 DIAGNOSIS — N186 End stage renal disease: Secondary | ICD-10-CM | POA: Diagnosis not present

## 2014-08-12 DIAGNOSIS — E1143 Type 2 diabetes mellitus with diabetic autonomic (poly)neuropathy: Secondary | ICD-10-CM | POA: Diagnosis not present

## 2014-08-12 DIAGNOSIS — G4730 Sleep apnea, unspecified: Secondary | ICD-10-CM | POA: Insufficient documentation

## 2014-08-12 DIAGNOSIS — Z9114 Patient's other noncompliance with medication regimen: Secondary | ICD-10-CM | POA: Insufficient documentation

## 2014-08-12 DIAGNOSIS — E875 Hyperkalemia: Secondary | ICD-10-CM | POA: Insufficient documentation

## 2014-08-12 DIAGNOSIS — E1165 Type 2 diabetes mellitus with hyperglycemia: Secondary | ICD-10-CM | POA: Diagnosis not present

## 2014-08-12 DIAGNOSIS — E860 Dehydration: Secondary | ICD-10-CM | POA: Insufficient documentation

## 2014-08-12 DIAGNOSIS — Z87891 Personal history of nicotine dependence: Secondary | ICD-10-CM | POA: Diagnosis not present

## 2014-08-12 DIAGNOSIS — Z940 Kidney transplant status: Secondary | ICD-10-CM | POA: Insufficient documentation

## 2014-08-12 DIAGNOSIS — E11319 Type 2 diabetes mellitus with unspecified diabetic retinopathy without macular edema: Secondary | ICD-10-CM | POA: Diagnosis present

## 2014-08-12 DIAGNOSIS — E785 Hyperlipidemia, unspecified: Secondary | ICD-10-CM | POA: Diagnosis not present

## 2014-08-12 DIAGNOSIS — E119 Type 2 diabetes mellitus without complications: Secondary | ICD-10-CM

## 2014-08-12 DIAGNOSIS — Z91199 Patient's noncompliance with other medical treatment and regimen due to unspecified reason: Secondary | ICD-10-CM

## 2014-08-12 DIAGNOSIS — R55 Syncope and collapse: Secondary | ICD-10-CM | POA: Diagnosis present

## 2014-08-12 DIAGNOSIS — N189 Chronic kidney disease, unspecified: Secondary | ICD-10-CM

## 2014-08-12 DIAGNOSIS — Z794 Long term (current) use of insulin: Secondary | ICD-10-CM | POA: Insufficient documentation

## 2014-08-12 DIAGNOSIS — Z79899 Other long term (current) drug therapy: Secondary | ICD-10-CM | POA: Diagnosis not present

## 2014-08-12 DIAGNOSIS — D849 Immunodeficiency, unspecified: Secondary | ICD-10-CM | POA: Diagnosis present

## 2014-08-12 DIAGNOSIS — Z89519 Acquired absence of unspecified leg below knee: Secondary | ICD-10-CM

## 2014-08-12 DIAGNOSIS — D899 Disorder involving the immune mechanism, unspecified: Secondary | ICD-10-CM

## 2014-08-12 DIAGNOSIS — I120 Hypertensive chronic kidney disease with stage 5 chronic kidney disease or end stage renal disease: Secondary | ICD-10-CM | POA: Insufficient documentation

## 2014-08-12 DIAGNOSIS — Z9119 Patient's noncompliance with other medical treatment and regimen: Secondary | ICD-10-CM

## 2014-08-12 DIAGNOSIS — R739 Hyperglycemia, unspecified: Secondary | ICD-10-CM

## 2014-08-12 LAB — BASIC METABOLIC PANEL
Anion gap: 5 (ref 5–15)
BUN: 53 mg/dL — ABNORMAL HIGH (ref 6–23)
CALCIUM: 8.8 mg/dL (ref 8.4–10.5)
CO2: 23 mmol/L (ref 19–32)
CREATININE: 1.84 mg/dL — AB (ref 0.50–1.35)
Chloride: 105 mmol/L (ref 96–112)
GFR calc Af Amer: 46 mL/min — ABNORMAL LOW (ref >90)
GFR, EST NON AFRICAN AMERICAN: 40 mL/min — AB (ref >90)
GLUCOSE: 454 mg/dL — AB (ref 70–99)
Potassium: 6.1 mmol/L (ref 3.5–5.1)
SODIUM: 133 mmol/L — AB (ref 135–145)

## 2014-08-12 LAB — I-STAT CHEM 8, ED
BUN: 47 mg/dL — ABNORMAL HIGH (ref 6–23)
CALCIUM ION: 1.21 mmol/L (ref 1.12–1.23)
CREATININE: 1.8 mg/dL — AB (ref 0.50–1.35)
Chloride: 104 mmol/L (ref 96–112)
GLUCOSE: 473 mg/dL — AB (ref 70–99)
HCT: 44 % (ref 39.0–52.0)
Hemoglobin: 15 g/dL (ref 13.0–17.0)
POTASSIUM: 6 mmol/L — AB (ref 3.5–5.1)
SODIUM: 135 mmol/L (ref 135–145)
TCO2: 20 mmol/L (ref 0–100)

## 2014-08-12 LAB — URINALYSIS, ROUTINE W REFLEX MICROSCOPIC
BILIRUBIN URINE: NEGATIVE
Glucose, UA: >1000 mg/dL — AB
Ketones, ur: NEGATIVE mg/dL
Leukocytes, UA: NEGATIVE
NITRITE: NEGATIVE
SPECIFIC GRAVITY, URINE: 1.016 (ref 1.005–1.030)
UROBILINOGEN UA: 0.2 mg/dL (ref 0.0–1.0)
pH: 6 (ref 5.0–8.0)

## 2014-08-12 LAB — CBC
HCT: 38.9 % — ABNORMAL LOW (ref 39.0–52.0)
HEMOGLOBIN: 12.2 g/dL — AB (ref 13.0–17.0)
MCH: 27.3 pg (ref 26.0–34.0)
MCHC: 31.4 g/dL (ref 30.0–36.0)
MCV: 87 fL (ref 78.0–100.0)
PLATELETS: 154 10*3/uL (ref 150–400)
RBC: 4.47 MIL/uL (ref 4.22–5.81)
RDW: 14.4 % (ref 11.5–15.5)
WBC: 8.4 10*3/uL (ref 4.0–10.5)

## 2014-08-12 LAB — TROPONIN I: Troponin I: <0.03 ng/mL (ref <0.031)

## 2014-08-12 LAB — I-STAT CG4 LACTIC ACID, ED: Lactic Acid, Venous: 1.14 mmol/L (ref 0.5–2.0)

## 2014-08-12 LAB — URINE MICROSCOPIC-ADD ON

## 2014-08-12 MED ORDER — SODIUM CHLORIDE 0.9 % IV BOLUS (SEPSIS)
1000.0000 mL | Freq: Once | INTRAVENOUS | Status: AC
  Administered 2014-08-12: 1000 mL via INTRAVENOUS

## 2014-08-12 NOTE — ED Provider Notes (Signed)
Medical screening examination/treatment/procedure(s) were conducted as a shared visit with non-physician practitioner(s) and myself.  I personally evaluated the patient during the encounter.   EKG Interpretation   Date/Time:  Monday August 12 2014 19:52:53 EST Ventricular Rate:  81 PR Interval:  206 QRS Duration: 90 QT Interval:  366 QTC Calculation: 425 R Axis:   43 Text Interpretation:  Sinus rhythm Borderline prolonged PR interval  Anteroseptal infarct, age indeterminate Lateral leads are also involved No  significant change was found Confirmed by [facility]  MD, TREY ([id number]) on  08/12/2014 10:27:40 PM      54 yo male with hx of diabetes presenting after an episode of syncope after manual rolling himself in his wheelchair to the kitchen.  On exam, well appearing, nontoxic, not distressed, normal respiratory effort, normal perfusion.  He was found to be hyperglycemic, dehydrated, with acute on chronic renal insufficiency, and mild hyperkalemia.  Plan IV fluids, recheck potassium, cardiac monitoring, and admit.    Clinical Impression: 1. Acute-on-chronic kidney injury   2. Syncope, unspecified syncope type   3. Hyperkalemia   4. Hyperglycemia   5. Medically noncompliant   6. AKI (acute kidney injury)       Candyce ChurnJohn David Marcio Hoque III, MD 08/13/14 [phone]

## 2014-08-12 NOTE — ED Notes (Signed)
Dr. Romeo AppleHarrison made aware of patients Potassium and Glucose levels.

## 2014-08-12 NOTE — ED Notes (Addendum)
Patient presents from home via EMS for syncopal episode. Patient reports unwitnessed syncopal episode. Hx of DM, didn't take insulin, per EMS glucose "reading high".   18g L AC. 200cc NS in route.   VS: 152/98, 82Hr, 16Resp.

## 2014-08-12 NOTE — ED Notes (Signed)
Critical potassium 6.1 called from lab. K Drewery RN made aware.

## 2014-08-12 NOTE — ED Notes (Signed)
Bed: WA24 Expected date:  Expected time:  Means of arrival:  Comments: EMS-syncopal episode 

## 2014-08-12 NOTE — ED Provider Notes (Signed)
CSN: [id number]     Arrival date & time 08/12/14  1939 History   First MD Initiated Contact with Patient 08/12/14 2114     Chief Complaint  Patient presents with  . Near Syncope     (Consider location/radiation/quality/duration/timing/severity/associated sxs/prior Treatment) The history is provided by the patient. No language interpreter was used.  Adonus Uselman is a 54 y/o M with PMHx of HTN, PVD, s/p renal transplant of the right kidney in 2004, GERD, ESRD, DM retinopathy, LLE amputation presenting to the ED with a syncopal episode that occurred this evening at approximately 6:30PM. Patient reported that he was pushing himself in the wheelchair he had sudden onset of dizziness, cold sweat and passed out. Patient reported that the syncopal episode was not witnessed - patient does not know how long he was out for. Patient reported that when he awoke he knew who he was and what was going on around him. Patient reported that he is fully aware that his glucose levels are high. Stated that he has not been taking his medications due to monetary issues. Stated that he continues to take the myfortic for his renal transplant. Denied chest pain, shortness of breath, difficulty breathing, nausea, vomiting, abdominal pain, neck pain, back pain, blurred vision, sudden loss of vision, headache, dizziness, numbness, tingling, fever, chills. PCP Dr. Hyman Hopes  Past Medical History  Diagnosis Date  . Hypertension   . PVD (peripheral vascular disease)   . S/p cadaver renal transplant   . Cold intolerance   . 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 GERD (gastroesophageal reflux disease)   . 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 Hypertriglyceridemia   . Diabetic retinopathy   . Sleep apnea     in the past .  Was cleared in 2004 to not wear CPap.  [place] in [place]  . High cholesterol   . Pneumonia 05/09/2013    "first time was today" (05/09/2013)  . Type II diabetes mellitus   . ESRD (end stage renal disease)      "no dialysis; had kidney transplant" (05/09/2013)   Past Surgical History  Procedure Laterality Date  . Kidney transplant Right 2004  . Cataract extraction w/ intraocular lens  implant, bilateral Bilateral ~ 1997  . Av fistula placement  1997-2004    "I've got 3; right upper arm; right forearm; left forearm "  . Av fistula repair  1997-2004    "multiple"  . Amputation  02/25/2012    Procedure: AMPUTATION DIGIT;  Surgeon: Larina Earthly, MD;  Location: [facility] OR;  Service: Vascular;  Laterality: Left;  Left 2nd Toe Amputation, Incision and Drainage left lateral foot.  . Amputation  03/22/2012    Procedure: AMPUTATION BELOW KNEE;  Surgeon: Nadara Mustard, MD;  Location: [facility] OR;  Service: Orthopedics;  Laterality: Left;  Left Below Knee Amputation  . Amputation  04/21/2012    Procedure: AMPUTATION BELOW KNEE;  Surgeon: Nadara Mustard, MD;  Location: [facility] OR;  Service: Orthopedics;  Laterality: Left;  Left below knee amputation revision  . Abdominal aortagram N/A 02/22/2012    Procedure: ABDOMINAL Ronny Flurry;  Surgeon: Nada Libman, MD;  Location: [facility] CATH LAB;  Service: Cardiovascular;  Laterality: N/A;   Family History  Problem Relation Age of Onset  . Diabetes Mother   . Diabetes Father   . Lupus Sister    History  Substance Use Topics  . Smoking status: Former Smoker -- 0.30 packs/day for 3 years    Types: Cigarettes  Quit date: 07/05/1981  . Smokeless tobacco: Never Used  . Alcohol Use: 1.8 oz/week    3 Cans of beer per week    Review of Systems  Constitutional: Negative for fever and chills.  Eyes: Negative for visual disturbance.  Respiratory: Negative for chest tightness and shortness of breath.   Cardiovascular: Negative for chest pain.  Gastrointestinal: Negative for nausea, vomiting and abdominal pain.  Musculoskeletal: Negative for back pain and neck pain.  Neurological: Positive for syncope. Negative for dizziness, weakness, numbness and headaches.      Allergies   Penicillins  Home Medications   Prior to Admission medications   Medication Sig Start Date End Date Taking? Authorizing Provider  furosemide (LASIX) 40 MG tablet Take 20 mg by mouth daily.   Yes Historical Provider, MD  insulin detemir (LEVEMIR) 100 UNIT/ML injection Inject 20 Units into the skin 2 (two) times daily.   Yes Historical Provider, MD  lisinopril (PRINIVIL,ZESTRIL) 10 MG tablet Take 1 tablet (10 mg total) by mouth daily. 09/07/13  Yes Richarda OverlieNayana Abrol, MD  metoprolol tartrate (LOPRESSOR) 25 MG tablet Take 1 tablet (25 mg total) by mouth 2 (two) times daily. 09/07/13  Yes Richarda OverlieNayana Abrol, MD  mycophenolate (MYFORTIC) 360 MG TBEC EC tablet Take 720 mg by mouth 2 (two) times daily. Take 2 Tablets (720 mg) By Mouth BID.   Yes Historical Provider, MD  predniSONE (DELTASONE) 5 MG tablet Take 5 mg by mouth daily.   Yes Historical Provider, MD  tacrolimus (PROGRAF) 1 MG capsule Take 4 mg by mouth 2 (two) times daily. Take 4 Tablets (8 mg) By Mouth BID.   Yes Historical Provider, MD  insulin aspart protamine- aspart (NOVOLOG MIX 70/30) (70-30) 100 UNIT/ML injection Inject 0.2 mLs (20 Units total) into the skin 2 (two) times daily with a meal. Patient not taking: Reported on 08/12/2014 06/06/13   Richarda OverlieNayana Abrol, MD   BP 123/77 mmHg  Pulse 78  Temp(Src) 98.8 F (37.1 C) (Oral)  Resp 18  SpO2 94% Physical Exam  Constitutional: He is oriented to person, place, and time. He appears well-developed and well-nourished. No distress.  HENT:  Head: Normocephalic and atraumatic.  Mouth/Throat: No oropharyngeal exudate.  Dry mucus membranes  Eyes: Conjunctivae and EOM are normal. Pupils are equal, round, and reactive to light. Right eye exhibits no discharge. Left eye exhibits no discharge.  Neck: Normal range of motion. Neck supple. No tracheal deviation present.  Negative neck stiffness Negative nuchal rigidity  Negative cervical lymphadenopathy  Negative meningeal signs  Cardiovascular: Normal rate,  regular rhythm and normal heart sounds.  Exam reveals no friction rub.   No murmur heard. Pulmonary/Chest: Effort normal and breath sounds normal. No respiratory distress. He has no wheezes. He has no rales.  Musculoskeletal: Normal range of motion.  BKA of the LLE  Lymphadenopathy:    He has no cervical adenopathy.  Neurological: He is alert and oriented to person, place, and time. No cranial nerve deficit. He exhibits normal muscle tone. Coordination normal.  Cranial nerves grossly intact Strength 5+/5+ to upper and lower extremities bilaterally with resistance applied, equal distribution noted Equal grip strength bilaterally  Sensation intact with differentiation to sharp and dull touch  Patient follows commands well  Patient responds to questions appropriately  Skin: Skin is warm and dry. No rash noted. He is not diaphoretic. No erythema.  Psychiatric: He has a normal mood and affect. His behavior is normal. Thought content normal.  Nursing note and vitals reviewed.  ED Course  Procedures (including critical care time)  Results for orders placed or performed during the hospital encounter of 08/12/14  CBC  (at [facility])  Result Value Ref Range   WBC 8.4 4.0 - 10.5 K/uL   RBC 4.47 4.22 - 5.81 MIL/uL   Hemoglobin 12.2 (L) 13.0 - 17.0 g/dL   HCT 16.1 (L) 09.6 - 04.5 %   MCV 87.0 78.0 - 100.0 fL   MCH 27.3 26.0 - 34.0 pg   MCHC 31.4 30.0 - 36.0 g/dL   RDW 40.9 81.1 - 91.4 %   Platelets 154 150 - 400 K/uL  Basic metabolic panel  (at [facility])  Result Value Ref Range   Sodium 133 (L) 135 - 145 mmol/L   Potassium 6.1 (HH) 3.5 - 5.1 mmol/L   Chloride 105 96 - 112 mmol/L   CO2 23 19 - 32 mmol/L   Glucose, Bld 454 (H) 70 - 99 mg/dL   BUN 53 (H) 6 - 23 mg/dL   Creatinine, Ser 7.82 (H) 0.50 - 1.35 mg/dL   Calcium 8.8 8.4 - 95.6 mg/dL   GFR calc non Af Amer 40 (L) >90 mL/min   GFR calc Af Amer 46 (L) >90 mL/min   Anion gap 5 5 - 15  Troponin I  Result  Value Ref Range   Troponin I <0.03 <0.031 ng/mL  Ketones, qualitative  Result Value Ref Range   Acetone, Bld NEGATIVE NEGATIVE  Urinalysis, Routine w reflex microscopic  Result Value Ref Range   Color, Urine YELLOW YELLOW   APPearance CLEAR CLEAR   Specific Gravity, Urine 1.016 1.005 - 1.030   pH 6.0 5.0 - 8.0   Glucose, UA >1000 (A) NEGATIVE mg/dL   Hgb urine dipstick MODERATE (A) NEGATIVE   Bilirubin Urine NEGATIVE NEGATIVE   Ketones, ur NEGATIVE NEGATIVE mg/dL   Protein, ur >213 (A) NEGATIVE mg/dL   Urobilinogen, UA 0.2 0.0 - 1.0 mg/dL   Nitrite NEGATIVE NEGATIVE   Leukocytes, UA NEGATIVE NEGATIVE  Urine microscopic-add on  Result Value Ref Range   Squamous Epithelial / LPF RARE RARE   WBC, UA 3-6 <3 WBC/hpf   RBC / HPF 11-20 <3 RBC/hpf   Bacteria, UA RARE RARE   Casts HYALINE CASTS (A) NEGATIVE  I-Stat Chem 8, ED  Result Value Ref Range   Sodium 135 135 - 145 mmol/L   Potassium 6.0 (H) 3.5 - 5.1 mmol/L   Chloride 104 96 - 112 mmol/L   BUN 47 (H) 6 - 23 mg/dL   Creatinine, Ser 0.86 (H) 0.50 - 1.35 mg/dL   Glucose, Bld 578 (H) 70 - 99 mg/dL   Calcium, Ion 4.69 6.29 - 1.23 mmol/L   TCO2 20 0 - 100 mmol/L   Hemoglobin 15.0 13.0 - 17.0 g/dL   HCT 52.8 41.3 - 24.4 %  I-Stat CG4 Lactic Acid, ED  Result Value Ref Range   Lactic Acid, Venous 1.14 0.5 - 2.0 mmol/L    Labs Review Labs Reviewed  CBC - Abnormal; Notable for the following:    Hemoglobin 12.2 (*)    HCT 38.9 (*)    All other components within normal limits  BASIC METABOLIC PANEL - Abnormal; Notable for the following:    Sodium 133 (*)    Potassium 6.1 (*)    Glucose, Bld 454 (*)    BUN 53 (*)    Creatinine, Ser 1.84 (*)    GFR calc non Af Amer 40 (*)  GFR calc Af Amer 46 (*)    All other components within normal limits  URINALYSIS, ROUTINE W REFLEX MICROSCOPIC - Abnormal; Notable for the following:    Glucose, UA >1000 (*)    Hgb urine dipstick MODERATE (*)    Protein, ur >300 (*)    All other  components within normal limits  URINE MICROSCOPIC-ADD ON - Abnormal; Notable for the following:    Casts HYALINE CASTS (*)    All other components within normal limits  I-STAT CHEM 8, ED - Abnormal; Notable for the following:    Potassium 6.0 (*)    BUN 47 (*)    Creatinine, Ser 1.80 (*)    Glucose, Bld 473 (*)    All other components within normal limits  TROPONIN I  KETONES, QUALITATIVE  POTASSIUM  CBG MONITORING, ED  I-STAT CG4 LACTIC ACID, ED    Imaging Review Dg Chest Port 1 View  08/12/2014   CLINICAL DATA:  Sudden onset left upper chest pain and low blood pressure this p.m.  EXAM: PORTABLE CHEST - 1 VIEW  COMPARISON:  07/28/2013  FINDINGS: Shallow inspiration with linear fibrosis or atelectasis in the right mid lung. Heart size and pulmonary vascularity are normal for technique. No focal airspace disease. No blunting of costophrenic angles. No pneumothorax. Calcification of the aorta.  IMPRESSION: No active disease.   Electronically Signed   By: Burman Nieves M.D.   On: 08/12/2014 22:22     EKG Interpretation   Date/Time:  Monday August 12 2014 19:52:53 EST Ventricular Rate:  81 PR Interval:  206 QRS Duration: 90 QT Interval:  366 QTC Calculation: 425 R Axis:   43 Text Interpretation:  Sinus rhythm Borderline prolonged PR interval  Anteroseptal infarct, age indeterminate Lateral leads are also involved No  significant change was found Confirmed by [facility]  MD, TREY ([id number]) on  08/12/2014 10:27:40 PM       10:47 PM Patient seen and assessed by attending physician, Dr. Sarina Ser. Recommended hydration at this time with IV fluids and agreed to plan of admission.   12:23 AM This provider spoke with Dr. Clyde Lundborg, [facility]. Discussed case, labs, imaging, vitals, ED course in great detail. Recommended patient be transferred over to [facility] where he can be seen by Nephrology. Reported that the accepting physician is Dr. Allena Katz. Reported that Nephrology does not need  to see patient tonight, but will see tomorrow.   12:42 AM Spoke with Dr. Marisue Humble, Nephrologist on call physician. Discussed case in great detail. Nephrology to see patient.   MDM   Final diagnoses:  Acute-on-chronic kidney injury  Syncope, unspecified syncope type  Hyperkalemia  Hyperglycemia  Medically noncompliant    Medications  calcium chloride 2 g in sodium chloride 0.9 % 100 mL IVPB (not administered)  sodium chloride 0.9 % bolus 1,000 mL (1,000 mLs Intravenous New Bag/Given 08/12/14 2327)    Filed Vitals:   08/12/14 2212 08/12/14 2327 08/12/14 2330 08/13/14 0058  BP: 129/67 126/75 123/77   Pulse:  81 78   Temp:    98.8 F (37.1 C)  TempSrc:    Oral  Resp:  14 18   SpO2:  94% 94%     EKG noted sinus rhythm with prolonged PR interval, heart rate 81 bpm. Troponin negative elevation. CBC negative elevated leukocytosis. Hemoglobin 12.2, hematocrit 38.9. BMP noted sodium of 133, potassium of 6.1 - appears to be hemolyzed. Glucose level of 454, negative elevated anion gap-5.0 mEq per liter. Elevated BUN  53, creatinine of 1.84. Lactic acid negative elevation. Ketones negative.UA noted moderate hemoglobin with negative nitrites and leukocytes, hyaline casts noted. Negative findings of ketones noted in urine. Chest x-ray negative for acute cardiopulmonary disease. Recheck a potassium level pending. Doubt DKA. Patient appears to be mildly dehydrated, IV fluids administered in ED setting. Patient presented to the ED with mild hyponatremia, hyperkalemia, elevated BUN/creatinine. Patient appears to be in acute on chronic kidney failure, patient has history of cadaver renal transplant performed in 2004 on mycophenolate. Patient presenting to the ED with syncopal episode that occurred today. Mild drop in pulse ox noted on room air-83%, has increased and stayed in the 94%'s. Discussed case in great detail with Dr. Clyde Lundborg who recommended patient to be transferred to [facility] for nephrology to see  patient. Reported that patient will be accepted under the care of Dr. Leodis Binet. Nephrology consulted and to see patient. Discussed admission with patient who agrees to plan of care. Patient stable, afebrile. Patient stable for transfer.   Raymon Mutton, PA-C 08/13/14 0112  Candyce Churn III, MD 08/13/14 [phone]

## 2014-08-13 ENCOUNTER — Other Ambulatory Visit (HOSPITAL_COMMUNITY): Payer: Medicare Other

## 2014-08-13 ENCOUNTER — Inpatient Hospital Stay (HOSPITAL_COMMUNITY): Payer: Medicare Other

## 2014-08-13 ENCOUNTER — Encounter (HOSPITAL_COMMUNITY): Payer: Self-pay | Admitting: *Deleted

## 2014-08-13 DIAGNOSIS — E875 Hyperkalemia: Secondary | ICD-10-CM

## 2014-08-13 DIAGNOSIS — N186 End stage renal disease: Secondary | ICD-10-CM | POA: Diagnosis not present

## 2014-08-13 DIAGNOSIS — E0843 Diabetes mellitus due to underlying condition with diabetic autonomic (poly)neuropathy: Secondary | ICD-10-CM | POA: Diagnosis not present

## 2014-08-13 DIAGNOSIS — N179 Acute kidney failure, unspecified: Secondary | ICD-10-CM | POA: Diagnosis not present

## 2014-08-13 DIAGNOSIS — I10 Essential (primary) hypertension: Secondary | ICD-10-CM

## 2014-08-13 DIAGNOSIS — Z794 Long term (current) use of insulin: Secondary | ICD-10-CM

## 2014-08-13 DIAGNOSIS — N189 Chronic kidney disease, unspecified: Secondary | ICD-10-CM

## 2014-08-13 DIAGNOSIS — R55 Syncope and collapse: Secondary | ICD-10-CM | POA: Diagnosis not present

## 2014-08-13 DIAGNOSIS — I120 Hypertensive chronic kidney disease with stage 5 chronic kidney disease or end stage renal disease: Secondary | ICD-10-CM | POA: Diagnosis not present

## 2014-08-13 DIAGNOSIS — E1165 Type 2 diabetes mellitus with hyperglycemia: Secondary | ICD-10-CM

## 2014-08-13 DIAGNOSIS — Z940 Kidney transplant status: Secondary | ICD-10-CM | POA: Diagnosis not present

## 2014-08-13 LAB — BASIC METABOLIC PANEL
Anion gap: 7 (ref 5–15)
BUN: 46 mg/dL — AB (ref 6–23)
CALCIUM: 8.8 mg/dL (ref 8.4–10.5)
CO2: 23 mmol/L (ref 19–32)
CREATININE: 1.7 mg/dL — AB (ref 0.50–1.35)
Chloride: 107 mmol/L (ref 96–112)
GFR, EST AFRICAN AMERICAN: 51 mL/min — AB (ref >90)
GFR, EST NON AFRICAN AMERICAN: 44 mL/min — AB (ref >90)
Glucose, Bld: 368 mg/dL — ABNORMAL HIGH (ref 70–99)
Potassium: 4.8 mmol/L (ref 3.5–5.1)
Sodium: 137 mmol/L (ref 135–145)

## 2014-08-13 LAB — CBC
HEMATOCRIT: 35.7 % — AB (ref 39.0–52.0)
HEMOGLOBIN: 11.2 g/dL — AB (ref 13.0–17.0)
MCH: 27.3 pg (ref 26.0–34.0)
MCHC: 31.4 g/dL (ref 30.0–36.0)
MCV: 86.9 fL (ref 78.0–100.0)
Platelets: 153 10*3/uL (ref 150–400)
RBC: 4.11 MIL/uL — ABNORMAL LOW (ref 4.22–5.81)
RDW: 14.5 % (ref 11.5–15.5)
WBC: 8.8 10*3/uL (ref 4.0–10.5)

## 2014-08-13 LAB — CREATININE, URINE, RANDOM: CREATININE, URINE: 52.3 mg/dL

## 2014-08-13 LAB — RAPID URINE DRUG SCREEN, HOSP PERFORMED
AMPHETAMINES: NOT DETECTED
Barbiturates: NOT DETECTED
Benzodiazepines: NOT DETECTED
Cocaine: NOT DETECTED
OPIATES: NOT DETECTED
TETRAHYDROCANNABINOL: NOT DETECTED

## 2014-08-13 LAB — UREA NITROGEN, URINE: Urea Nitrogen, Ur: 339 mg/dL

## 2014-08-13 LAB — POTASSIUM: Potassium: 4.9 mmol/L (ref 3.5–5.1)

## 2014-08-13 LAB — GLUCOSE, CAPILLARY
GLUCOSE-CAPILLARY: 335 mg/dL — AB (ref 70–99)
GLUCOSE-CAPILLARY: 326 mg/dL — AB (ref 70–99)
GLUCOSE-CAPILLARY: 61 mg/dL — AB (ref 70–99)
Glucose-Capillary: 407 mg/dL — ABNORMAL HIGH (ref 70–99)
Glucose-Capillary: 147 mg/dL — ABNORMAL HIGH (ref 70–99)

## 2014-08-13 LAB — PROTIME-INR
INR: 1.02 (ref 0.00–1.49)
Prothrombin Time: 13.5 seconds (ref 11.6–15.2)

## 2014-08-13 LAB — TSH: TSH: 1.16 u[IU]/mL (ref 0.350–4.500)

## 2014-08-13 LAB — KETONES, QUALITATIVE: Acetone, Bld: NEGATIVE

## 2014-08-13 MED ORDER — MYCOPHENOLATE SODIUM 180 MG PO TBEC
720.0000 mg | DELAYED_RELEASE_TABLET | Freq: Two times a day (BID) | ORAL | Status: DC
  Administered 2014-08-13: 720 mg via ORAL
  Filled 2014-08-13 (×2): qty 4

## 2014-08-13 MED ORDER — SODIUM CHLORIDE 0.9 % IJ SOLN: 3.0000 mL | INTRAMUSCULAR | Status: DC | PRN

## 2014-08-13 MED ORDER — SODIUM CHLORIDE 0.9 % IJ SOLN
3.0000 mL | Freq: Two times a day (BID) | INTRAMUSCULAR | Status: DC
  Administered 2014-08-13: 3 mL via INTRAVENOUS

## 2014-08-13 MED ORDER — PREDNISONE 5 MG PO TABS
5.0000 mg | ORAL_TABLET | Freq: Every day | ORAL | Status: DC
  Administered 2014-08-13: 5 mg via ORAL
  Filled 2014-08-13: qty 1

## 2014-08-13 MED ORDER — INSULIN DETEMIR 100 UNIT/ML ~~LOC~~ SOLN
15.0000 [IU] | Freq: Two times a day (BID) | SUBCUTANEOUS | Status: DC
  Administered 2014-08-13 (×2): 15 [IU] via SUBCUTANEOUS
  Filled 2014-08-13 (×3): qty 0.15

## 2014-08-13 MED ORDER — SODIUM CHLORIDE 0.9 % IV SOLN: 250.0000 mL | INTRAVENOUS | Status: DC | PRN

## 2014-08-13 MED ORDER — SODIUM CHLORIDE 0.9 % IJ SOLN: 3.0000 mL | Freq: Two times a day (BID) | INTRAMUSCULAR | Status: DC

## 2014-08-13 MED ORDER — AMLODIPINE BESYLATE 5 MG PO TABS
5.0000 mg | ORAL_TABLET | Freq: Every day | ORAL | Status: DC
  Administered 2014-08-13: 5 mg via ORAL
  Filled 2014-08-13: qty 1

## 2014-08-13 MED ORDER — METOPROLOL TARTRATE 25 MG PO TABS
25.0000 mg | ORAL_TABLET | Freq: Two times a day (BID) | ORAL | Status: DC
  Administered 2014-08-13 (×2): 25 mg via ORAL
  Filled 2014-08-13 (×2): qty 1

## 2014-08-13 MED ORDER — INSULIN ASPART 100 UNIT/ML ~~LOC~~ SOLN
0.0000 [IU] | Freq: Three times a day (TID) | SUBCUTANEOUS | Status: DC
  Administered 2014-08-13: 7 [IU] via SUBCUTANEOUS

## 2014-08-13 MED ORDER — ACETAMINOPHEN 650 MG RE SUPP: 650.0000 mg | Freq: Four times a day (QID) | RECTAL | Status: DC | PRN

## 2014-08-13 MED ORDER — SODIUM CHLORIDE 0.9 % IV BOLUS (SEPSIS): 500.0000 mL | Freq: Once | INTRAVENOUS | Status: DC

## 2014-08-13 MED ORDER — HEPARIN SODIUM (PORCINE) 5000 UNIT/ML IJ SOLN
5000.0000 [IU] | Freq: Three times a day (TID) | INTRAMUSCULAR | Status: DC
  Administered 2014-08-13: 5000 [IU] via SUBCUTANEOUS
  Filled 2014-08-13 (×2): qty 1

## 2014-08-13 MED ORDER — SODIUM CHLORIDE 0.9 % IV SOLN
2.0000 g | Freq: Once | INTRAVENOUS | Status: DC
  Filled 2014-08-13: qty 20

## 2014-08-13 MED ORDER — ACETAMINOPHEN 325 MG PO TABS: 650.0000 mg | ORAL_TABLET | Freq: Four times a day (QID) | ORAL | Status: DC | PRN

## 2014-08-13 MED ORDER — TACROLIMUS 1 MG PO CAPS
4.0000 mg | ORAL_CAPSULE | Freq: Two times a day (BID) | ORAL | Status: DC
  Administered 2014-08-13 (×2): 4 mg via ORAL
  Filled 2014-08-13 (×2): qty 4

## 2014-08-13 NOTE — Discharge Instructions (Addendum)
Syncope Syncope is a medical term for fainting or passing out. This means you lose consciousness and drop to the ground. People are generally unconscious for less than 5 minutes. You may have some muscle twitches for up to 15 seconds before waking up and returning to normal. Syncope occurs more often in older adults, but it can happen to anyone. While most causes of syncope are not dangerous, syncope can be a sign of a serious medical problem. It is important to seek medical care.  CAUSES  Syncope is caused by a sudden drop in blood flow to the brain. The specific cause is often not determined. Factors that can bring on syncope include:  Taking medicines that lower blood pressure.  Sudden changes in posture, such as standing up quickly.  Taking more medicine than prescribed.  Standing in one place for too long.  Seizure disorders.  Dehydration and excessive exposure to heat.  Low blood sugar (hypoglycemia).  Straining to have a bowel movement.  Heart disease, irregular heartbeat, or other circulatory problems.  Fear, emotional distress, seeing blood, or severe pain. SYMPTOMS  Right before fainting, you may:  Feel dizzy or light-headed.  Feel nauseous.  See all white or all black in your field of vision.  Have cold, clammy skin. DIAGNOSIS  Your health care provider will ask about your symptoms, perform a physical exam, and perform an electrocardiogram (ECG) to record the electrical activity of your heart. Your health care provider may also perform other heart or blood tests to determine the cause of your syncope which may include:  Transthoracic echocardiogram (TTE). During echocardiography, sound waves are used to evaluate how blood flows through your heart.  Transesophageal echocardiogram (TEE).  Cardiac monitoring. This allows your health care provider to monitor your heart rate and rhythm in real time.  Holter monitor. This is a portable device that records your  heartbeat and can help diagnose heart arrhythmias. It allows your health care provider to track your heart activity for several days, if needed.  Stress tests by exercise or by giving medicine that makes the heart beat faster. TREATMENT  In most cases, no treatment is needed. Depending on the cause of your syncope, your health care provider may recommend changing or stopping some of your medicines. HOME CARE INSTRUCTIONS  Have someone stay with you until you feel stable.  Do not drive, use machinery, or play sports until your health care provider says it is okay.  Keep all follow-up appointments as directed by your health care provider.  Lie down right away if you start feeling like you might faint. Breathe deeply and steadily. Wait until all the symptoms have passed.  Drink enough fluids to keep your urine clear or pale yellow.  If you are taking blood pressure or heart medicine, get up slowly and take several minutes to sit and then stand. This can reduce dizziness. SEEK IMMEDIATE MEDICAL CARE IF:   You have a severe headache.  You have unusual pain in the chest, abdomen, or back.  You are bleeding from your mouth or rectum, or you have black or tarry stool.  You have an irregular or very fast heartbeat.  You have pain with breathing.  You have repeated fainting or seizure-like jerking during an episode.  You faint when sitting or lying down.  You have confusion.  You have trouble walking.  You have severe weakness.  You have vision problems. If you fainted, call your local emergency services (911 in [place] Do not  drive yourself to the hospital.  MAKE SURE YOU:  Understand these instructions.  Will watch your condition.  Will get help right away if you are not doing well or get worse. Document Released: 06/21/2005 Document Revised: 06/26/2013 Document Reviewed: 08/20/2011 [facility] Patient Information 2015 [place], [place]. This information is not intended to replace  advice given to you by your health care provider. Make sure you discuss any questions you have with your health care provider. Chronic Kidney Disease Chronic kidney disease occurs when the kidneys are damaged over a long period. The kidneys are two organs that lie on either side of the spine between the middle of the back and the front of the abdomen. The kidneys:   Remove wastes and extra water from the blood.   Produce important hormones. These help keep bones strong, regulate blood pressure, and help create red blood cells.   Balance the fluids and chemicals in the blood and tissues. A small amount of kidney damage may not cause problems, but a large amount of damage may make it difficult or impossible for the kidneys to work the way they should. If steps are not taken to slow down the kidney damage or stop it from getting worse, the kidneys may stop working permanently. Most of the time, chronic kidney disease does not go away. However, it can often be controlled, and those with the disease can usually live normal lives. CAUSES  The most common causes of chronic kidney disease are diabetes and high blood pressure (hypertension). Chronic kidney disease may also be caused by:   Diseases that cause the kidneys' filters to become inflamed.   Diseases that affect the immune system.   Genetic diseases.   Medicines that damage the kidneys, such as anti-inflammatory medicines.  Poisoning or exposure to toxic substances.   A reoccurring kidney or urinary infection.   A problem with urine flow. This may be caused by:   Cancer.   Kidney stones.   An enlarged prostate in males. SIGNS AND SYMPTOMS  Because the kidney damage in chronic kidney disease occurs slowly, symptoms develop slowly and may not be obvious until the kidney damage becomes severe. A person may have a kidney disease for years without showing any symptoms. Symptoms can include:   Swelling (edema) of the legs,  ankles, or feet.   Tiredness (lethargy).   Nausea or vomiting.   Confusion.   Problems with urination, such as:   Decreased urine production.   Frequent urination, especially at night.   Frequent accidents in children who are potty trained.   Muscle twitches and cramps.   Shortness of breath.  Weakness.   Persistent itchiness.   Loss of appetite.  Metallic taste in the mouth.  Trouble sleeping.  Slowed development in children.  Short stature in children. DIAGNOSIS  Chronic kidney disease may be detected and diagnosed by tests, including blood, urine, imaging, or kidney biopsy tests.  TREATMENT  Most chronic kidney diseases cannot be cured. Treatment usually involves relieving symptoms and preventing or slowing the progression of the disease. Treatment may include:   A special diet. You may need to avoid alcohol and foods thatare salty and high in potassium.   Medicines. These may:   Lower blood pressure.   Relieve anemia.   Relieve swelling.   Protect the bones. HOME CARE INSTRUCTIONS   Follow your prescribed diet.   Take medicines only as directed by your health care provider. Do not take any new medicines (prescription, over-the-counter, or nutritional  supplements) unless approved by your health care provider. Many medicines can worsen your kidney damage or need to have the dose adjusted.   Quit smoking if you smoke. Talk to your health care provider about a smoking cessation program.   Keep all follow-up visits as directed by your health care provider. SEEK IMMEDIATE MEDICAL CARE IF:  Your symptoms get worse or you develop new symptoms.   You develop symptoms of end-stage kidney disease. These include:   Headaches.   Abnormally dark or light skin.   Numbness in the hands or feet.   Easy bruising.   Frequent hiccups.   Menstruation stops.   You have a fever.   You have decreased urine production.   You  havepain or bleeding when urinating. MAKE SURE YOU:  Understand these instructions.  Will watch your condition.  Will get help right away if you are not doing well or get worse. FOR MORE INFORMATION   American Association of Kidney Patients: ResidentialShow.is  National Kidney Foundation: www.kidney.org  American Kidney Fund: FightingMatch.com.ee  [facility]: www.lifeoptions.org and www.kidneyschool.org Document Released: 03/30/2008 Document Revised: 11/05/2013 Document Reviewed: 02/18/2012 [facility] Patient Information 2015 [place], [place]. This information is not intended to replace advice given to you by your health care provider. Make sure you discuss any questions you have with your health care provider.

## 2014-08-13 NOTE — Plan of Care (Signed)
Problem: Phase I Progression Outcomes Goal: OOB as tolerated unless otherwise ordered Outcome: Progressing Pt currently ordered bedrest

## 2014-08-13 NOTE — Discharge Summary (Signed)
Physician Discharge Summary  Trevor Thompson ZOX:[id number] DOB: [birth year] DOA: 08/12/2014  PCP: Jeanann Lewandowsky, MD  Admit date: 08/12/2014 Discharge date: 08/13/2014  Recommendations for Outpatient Follow-up:  1. Patient was instructed to follow-up with primary care physician in about one week after discharge to make sure his symptoms are stable.  2. Lisinopril held because of slightly worse creatinine compared to baseline. Creatinine was 1.8 on this admission, baseline 1.2 about one year ago. Creatinine trended down to 1.7 today. Lisinopril on hold and patient advised to continue to hold it until he is kidney function rechecked by primary care physician.  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 He declines physical therapy evaluation.  Diet recommendation: as tolerated; renal diet  History of present illness:  54 y.o. male with past medical history of kidney transplantation in 2004 on immunosuppressive therapy, history of hypertension, diabetes, dyslipidemia, peripheral vascular disease, left lower extremity BKA who presented to [facility] long hospital status post sick component event while pushing himself in the wheelchair. Patient reported feeling little dizzy, weakness and palpitations prior to passing out. There was no shortness of breath. There was no seizure-like activity. Patient reports not hitting his head. Off note, patient noncompliant with her diabetic medications because of financial limitations.  In ED, patient was slightly hypotensive with  blood pressure 97/61 but this has quickly improved to 163/99. He was afebrile and his oxygen saturation was 98% on room air. Blood work revealed potassium of 6.1, troponin within normal limits, chest x-ray negative for acute cardiopulmonary problem, unremarkable urinalysis. Bump in creatinine is seen, 1.8 (he is baseline about one year ago is 1.2). Creatinine is trending down since admission.   Hospital Course:   Principal Problem:   Syncope / vasovagal syncope - Unclear ideology. Perhaps because of uncontrolled sugars versus vasovagal from dehydration and even possibly peripheral vascular disease versus high potassium. - He feels better this morning. He reports no dizziness and he declined participating in physical therapy. - The troponin on admission was within normal limits. His blood glucose on admission was elevated at 473 but anion gap was within normal limits and bicarbonate was within normal limits. - Off note, potassium was 6.1 on the admission. His subsequent potassium levels within normal limits. - We have restarted insulin regimen and advised patient to be compliant with insulin, he reports he will try to fill his medications. - Chest x-ray on admission did not show acute cardiopulmonary findings.  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 Reports being compliant with this regimen. - No need for renal ultrasound. Creatinine function is trending down. - We did hold lisinopril on the admission because this could potentiate worsening renal failure. Patient instructed to hold lisinopril until seen by primary care physician. They will have to check renal function and if it is improving and he may be restarted on lisinopril.    Insulin dependent type 2 diabetes mellitus, uncontrolled - Blood glucose elevated at 473 on the admission. Uncontrolled because of noncompliance. Stressed importance of compliance on  discharge.  Patient insists he wants to go home today. He will follow-up with primary care physician. He knows how to check his blood glucose.    HTN (hypertension) - May resume home medications but lisinopril on hold because of slight bump in creatinine compared to baseline.    S/P BKA (below knee amputation) - Mostly wheelchair-bound    Signed:  Manson PasseyEVINE, Nusrat Encarnacion, MD  [facility] 08/13/2014, 9:15 AM  Pager #: [phone]  Procedures:  None   Consultations:  None   Discharge Exam: Filed Vitals:   08/13/14 0340  BP: 137/83  Pulse: 79  Temp: 97.6 F (36.4 C)  Resp: 16   Filed Vitals:   08/13/14 0230 08/13/14 0300 08/13/14 0338 08/13/14 0340  BP: 131/79 147/93  137/83  Pulse: 83 79  79  Temp:    97.6 F (36.4 C)  TempSrc:      Resp:    16  Height:   5\' 11"  (1.803 m)   Weight:   115.7 kg (255 lb 1.2 oz)   SpO2: 52% 86%  98%    General: Pt is alert, follows commands appropriately, not in acute distress Cardiovascular: Regular rate and rhythm, S1/S2 (+) Respiratory: Clear to auscultation bilaterally, no wheezing, no crackles, no rhonchi Abdominal: Soft, non tender, non distended, bowel sounds +, no guarding Extremities: no cyanosis, pulses palpable; left lower extremity BKA Neuro: Grossly nonfocal  Discharge Instructions  Discharge Instructions    Call MD for:  difficulty breathing, headache or visual disturbances    Complete by:  As directed      Call MD for:  persistant nausea and vomiting    Complete by:  As directed      Call MD for:  severe uncontrolled pain    Complete by:  As directed      Diet - low sodium heart healthy    Complete by:  As directed      Increase activity slowly    Complete by:  As directed             Medication List    TAKE these medications        furosemide 40 MG tablet  Commonly known as:  LASIX  Take 20 mg by mouth daily.     insulin aspart protamine- aspart (70-30) 100 UNIT/ML injection  Commonly known as:   NOVOLOG MIX 70/30  Inject 0.2 mLs (20 Units total) into the skin 2 (two) times daily with a meal.     insulin detemir 100 UNIT/ML injection  Commonly known as:  LEVEMIR  Inject 20 Units into the skin 2 (two) times daily.     lisinopril 10 MG tablet  Commonly known as:  PRINIVIL,ZESTRIL  Take 1 tablet (10 mg total) by mouth daily.     metoprolol tartrate 25 MG tablet  Commonly known as:  LOPRESSOR  Take 1 tablet (25 mg total) by mouth 2 (two) times daily.     mycophenolate 360 MG Tbec EC tablet  Commonly known as:  MYFORTIC  Take 720 mg by mouth 2 (two) times daily. Take 2 Tablets (720 mg) By Mouth BID.     predniSONE 5 MG tablet  Commonly known as:  DELTASONE  Take 5 mg by mouth daily.     tacrolimus 1 MG capsule  Commonly known as:  PROGRAF  Take 4 mg by mouth 2 (two) times daily. Take 4 Tablets (8 mg) By Mouth BID.           Follow-up Information  Follow up with Jeanann Lewandowsky, MD. Schedule an appointment as soon as possible for a visit in 1 week.   Specialty:  Internal Medicine   Why:  Follow up appt after recent hospitalization   Contact information:   [address] [place] [address] [phone]        The results of significant diagnostics from this hospitalization (including imaging, microbiology, ancillary and laboratory) are listed below for reference.    Significant Diagnostic Studies: Dg Chest Port 1 View  08/12/2014   CLINICAL DATA:  Sudden onset left upper chest pain and low blood pressure this p.m.  EXAM: PORTABLE CHEST - 1 VIEW  COMPARISON:  07/28/2013  FINDINGS: Shallow inspiration with linear fibrosis or atelectasis in the right mid lung. Heart size and pulmonary vascularity are normal for technique. No focal airspace disease. No blunting of costophrenic angles. No pneumothorax. Calcification of the aorta.  IMPRESSION: No active disease.   Electronically Signed   By: Burman Nieves M.D.   On: 08/12/2014 22:22    Microbiology: No  results found for this or any previous visit (from the past 240 hour(s)).   Labs: Basic Metabolic Panel:  Recent Labs Lab 08/12/14 2001 08/12/14 2012 08/13/14 0020 08/13/14 0538  NA 133* 135  --  137  K 6.1* 6.0* 4.9 4.8  CL 105 104  --  107  CO2 23  --   --  23  GLUCOSE 454* 473*  --  368*  BUN 53* 47*  --  46*  CREATININE 1.84* 1.80*  --  1.70*  CALCIUM 8.8  --   --  8.8   Liver Function Tests: No results for input(s): AST, ALT, ALKPHOS, BILITOT, PROT, ALBUMIN in the last 168 hours. No results for input(s): LIPASE, AMYLASE in the last 168 hours. No results for input(s): AMMONIA in the last 168 hours. CBC:  Recent Labs Lab 08/12/14 2001 08/12/14 2012 08/13/14 0538  WBC 8.4  --  8.8  HGB 12.2* 15.0 11.2*  HCT 38.9* 44.0 35.7*  MCV 87.0  --  86.9  PLT 154  --  153   Cardiac Enzymes:  Recent Labs Lab 08/12/14 2001  TROPONINI <0.03   BNP: BNP (last 3 results) No results for input(s): BNP in the last 8760 hours.  ProBNP (last 3 results) No results for input(s): PROBNP in the last 8760 hours.  CBG: No results for input(s): GLUCAP in the last 168 hours.  Time coordinating discharge: Over 30 minutes

## 2014-08-13 NOTE — ED Notes (Signed)
Pt. CBG 401. NIU,Hospitalist made aware.

## 2014-08-13 NOTE — H&P (Signed)
[facility] History and Physical  Trevor Thompson ZOX:[id number] DOB: 04-16-1961 DOA: 08/12/2014  Referring physician: ED physician PCP: Jeanann Lewandowsky, MD  Specialists:   Chief Complaint: syncope  HPI: Trevor Thompson is a 54 y.o. male kidney transplantation in 2004,, hypertension, diabetes mellitus, hyperlipidemia, peripheral vascular disease, chronic kidney disease,  LLE BKA,  who presents with syncope.  Patient reports that at about 6:30PM, when was pushing himself in the wheelchair moving to kitchen at home, he had sudden onset of dizziness, palpitation, cold sweat and passed out. He lost control of bowel movement and had urinary incontinence. Per his daughter, his eyes rolling back, but he did not have seizure activity. The event lasted for about 5 minutes per his daughter. No body injury. Patient reported that he is fully aware that his glucose levels are high. Patient did not have unilateral weakness, numbness or tenderness incisions. No vision change or hearing loss. He stated that he has not been taking his DM medications due to monetary issues, but has been continued other medications, including tacrolimus, prednisone and mycophenolate. Patient denied chest pain, shortness of breath, difficulty breathing, nausea, vomiting, abdominal pain or dysuria. Of note, patient had two similar episodes at 2 months and 6 months ago respectively. He did not seek for treatment.  Work up in the ED demonstrates  potassium 6.1, negative troponin, lactate 1.14, urinalysis negative, chest x-ray is negative for acute abnormalities, worsening renal function with a creatinine up from 1.29 to 1.8. CBG=454 by BMP. Patient is admitted to inpatient for further evaluation and treatment. Renal was consulted by ED.  Review of Systems: As presented in the history of presenting illness, rest negative.  Where does patient live? At home Can patient participate in ADLs? little  Allergy:  Allergies  Allergen  Reactions  . Penicillins Other (See Comments)    Since birth    Past Medical History  Diagnosis Date  . Hypertension   . PVD (peripheral vascular disease)   . S/p cadaver renal transplant   . Cold intolerance   . 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 GERD (gastroesophageal reflux disease)   . 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 Hypertriglyceridemia   . Diabetic retinopathy   . Sleep apnea     in the past .  Was cleared in 2004 to not wear CPap.  [place] in [place]  . High cholesterol   . Pneumonia 05/09/2013    "first time was today" (05/09/2013)  . Type II diabetes mellitus   . ESRD (end stage renal disease)     "no dialysis; had kidney transplant" (05/09/2013)    Past Surgical History  Procedure Laterality Date  . Kidney transplant Right 2004  . Cataract extraction w/ intraocular lens  implant, bilateral Bilateral ~ 1997  . Av fistula placement  1997-2004    "I've got 3; right upper arm; right forearm; left forearm "  . Av fistula repair  1997-2004    "multiple"  . Amputation  02/25/2012    Procedure: AMPUTATION DIGIT;  Surgeon: Larina Earthly, MD;  Location: [facility] OR;  Service: Vascular;  Laterality: Left;  Left 2nd Toe Amputation, Incision and Drainage left lateral foot.  . Amputation  03/22/2012    Procedure: AMPUTATION BELOW KNEE;  Surgeon: Nadara Mustard, MD;  Location: [facility] OR;  Service: Orthopedics;  Laterality: Left;  Left Below Knee Amputation  . Amputation  04/21/2012    Procedure: AMPUTATION BELOW KNEE;  Surgeon: Nadara Mustard, MD;  Location: [facility] OR;  Service: Orthopedics;  Laterality: Left;  Left below knee amputation revision  . Abdominal aortagram N/A 02/22/2012    Procedure: ABDOMINAL Ronny Flurry;  Surgeon: Nada Libman, MD;  Location: [facility] CATH LAB;  Service: Cardiovascular;  Laterality: N/A;    Social History:  reports that he quit smoking about 33 years ago. His smoking use included Cigarettes. He has a .9 pack-year smoking history. He has never used smokeless  tobacco. He reports that he drinks about 1.8 oz of alcohol per week. He reports that he uses illicit drugs.  Family History:  Family History  Problem Relation Age of Onset  . Diabetes Mother   . Diabetes Father   . Lupus Sister      Prior to Admission medications   Medication Sig Start Date End Date Taking? Authorizing Provider  furosemide (LASIX) 40 MG tablet Take 20 mg by mouth daily.   Yes Historical Provider, MD  insulin detemir (LEVEMIR) 100 UNIT/ML injection Inject 20 Units into the skin 2 (two) times daily.   Yes Historical Provider, MD  lisinopril (PRINIVIL,ZESTRIL) 10 MG tablet Take 1 tablet (10 mg total) by mouth daily. 09/07/13  Yes Richarda Overlie, MD  metoprolol tartrate (LOPRESSOR) 25 MG tablet Take 1 tablet (25 mg total) by mouth 2 (two) times daily. 09/07/13  Yes Richarda Overlie, MD  mycophenolate (MYFORTIC) 360 MG TBEC EC tablet Take 720 mg by mouth 2 (two) times daily. Take 2 Tablets (720 mg) By Mouth BID.   Yes Historical Provider, MD  predniSONE (DELTASONE) 5 MG tablet Take 5 mg by mouth daily.   Yes Historical Provider, MD  tacrolimus (PROGRAF) 1 MG capsule Take 4 mg by mouth 2 (two) times daily. Take 4 Tablets (8 mg) By Mouth BID.   Yes Historical Provider, MD  insulin aspart protamine- aspart (NOVOLOG MIX 70/30) (70-30) 100 UNIT/ML injection Inject 0.2 mLs (20 Units total) into the skin 2 (two) times daily with a meal. Patient not taking: Reported on 08/12/2014 06/06/13   Richarda Overlie, MD    Physical Exam: Filed Vitals:   08/12/14 2211 08/12/14 2212 08/12/14 2327 08/12/14 2330  BP: 114/71 129/67 126/75 123/77  Pulse:   81 78  Resp:   14 18  SpO2:   94% 94%   General: Not in acute distress HEENT:       Eyes: PERRL, EOMI, no scleral icterus       ENT: No discharge from the ears and nose, no pharynx injection, no tonsillar enlargement.        Neck: No JVD, no bruit, no mass felt. Cardiac: S1/S2, RRR, No murmurs, No gallops or rubs Pulm: Good air movement bilaterally. Clear  to auscultation bilaterally. No rales, wheezing, rhonchi or rubs. Abd: Soft, nondistended, nontender, no rebound pain, no organomegaly, BS present Ext: 2+ pitting leg edema on the right. 2+DP/PT pulse on the right. L BKA Musculoskeletal: No joint deformities, erythema, or stiffness, ROM full Skin: No rashes.  Neuro: Alert and oriented X3, cranial nerves II-XII grossly intact, muscle strength 5/5 in all extremeties, sensation to light touch intact. Brachial reflex 1+ bilaterally. Knee reflex 1+ on the right. Negative Babinski's sign. Normal finger to nose test. Psych: Patient is not psychotic, no suicidal or hemocidal ideation.  Labs on Admission:  Basic Metabolic Panel:  Recent Labs Lab 08/12/14 2001 08/12/14 2012  NA 133* 135  K 6.1* 6.0*  CL 105 104  CO2 23  --   GLUCOSE 454* 473*  BUN 53* 47*  CREATININE 1.84* 1.80*  CALCIUM 8.8  --    Liver Function Tests: No results for input(s): AST, ALT, ALKPHOS, BILITOT, PROT, ALBUMIN in the last 168 hours. No results for input(s): LIPASE, AMYLASE in the last 168 hours. No results for input(s): AMMONIA in the last 168 hours. CBC:  Recent Labs Lab 08/12/14 2001 08/12/14 2012  WBC 8.4  --   HGB 12.2* 15.0  HCT 38.9* 44.0  MCV 87.0  --   PLT 154  --    Cardiac Enzymes:  Recent Labs Lab 08/12/14 2001  TROPONINI <0.03    BNP (last 3 results) No results for input(s): BNP in the last 8760 hours.  ProBNP (last 3 results) No results for input(s): PROBNP in the last 8760 hours.  CBG: No results for input(s): GLUCAP in the last 168 hours.  Radiological Exams on Admission: Dg Chest Port 1 View  08/12/2014   CLINICAL DATA:  Sudden onset left upper chest pain and low blood pressure this p.m.  EXAM: PORTABLE CHEST - 1 VIEW  COMPARISON:  07/28/2013  FINDINGS: Shallow inspiration with linear fibrosis or atelectasis in the right mid lung. Heart size and pulmonary vascularity are normal for technique. No focal airspace disease. No  blunting of costophrenic angles. No pneumothorax. Calcification of the aorta.  IMPRESSION: No active disease.   Electronically Signed   By: Burman NievesWilliam  Stevens M.D.   On: 08/12/2014 22:22    EKG: Independently reviewed. First degree AV block on EKG.  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 DD include arrhythmia (patient had palpitation and hyperkalemia; he has first degree AV block on EKG), orthostatic status and vasovagal sycope (he had diabetic autonomous neuropathy, which put patient at increased risk of orthostatic status and vasovagal), seizure (he did not have seizure activity, but his eyes rolling back during the event per his daughter), TIA/Stroke (less likely, no neurological findings on examination, no unilateral weakness, numbness or tingling sensations, no vision change or hearing loss).  -will admit to tele bed  -check orthostatic vital signs -EEG -TSH -may consider MRI-brain if all work up negative -PT/OT -repeat EKG in AM  AoCKD: s/p of kidney transplantation. Patient states that he has been compliant to immunosuppressant, including mycophenolate, tacrolimus and prednisone. Creatinine increased from 1.29 on 09/07/13 to 1.8 on admission. Urinalysis is negative. BUN 47/creatinine 1.80 with ratio>20, indicating possible prerenal.  -will hold lasix and lisinopril -ED gave NS bolus, about 1500 cc, will hold IVF now given he is mildly fluid overloaded (Has 2+ leg edema) -check FeUrea and US-renal -renal was consulted by ED, will follow up recommendations -Continue home  immunosuppressants -check Prograf level - [facility], but currently no tele bed. Will admit to [facility] now, but will transfer as soon as tele bed becomes available  Hyperkalemia: initial K=6.1 by BMP, possible hemolysis. The repeated K is 4.9 -will follow up by BMP in AM  HTN: -switch lisinopril to amlodipine, 5 mg daily -Continue metoprolol  Diabetes mellitus: A1c was 6.8 on 09/07/13. Patient is on Levemir 20 mg twice a day at home. -Decrease Levemir dose to 50 units twice a  day -Sliding-scale insulin   DVT ppx: SQ Heparin       Code Status: Full code Family Communication: Yes, patient's  Daughter and son      at bed side Disposition Plan: Admit to inpatient   Date of Service 08/13/2014    Lorretta Harp [facility] Pager [phone]  If 7PM-7AM, please contact night-coverage [email or web address] Password [email or web address] 08/13/2014, 12:29 AM

## 2014-08-13 NOTE — Care Management Note (Addendum)
    Page 1 of 1   08/13/2014     12:45:29 PM CARE MANAGEMENT NOTE 08/13/2014  Patient:  Trevor BurdockMCGEE,Trevor Thompson   Account Number:  [id number]  Date Initiated:  08/13/2014  Documentation initiated by:  Lanier ClamMAHABIR,Tomoko Sandra  Subjective/Objective Assessment:   54 y/o m admitted w/syncope.     Action/Plan:   From home.Has pcp,pharmacy.   Anticipated DC Date:  08/13/2014   Anticipated DC Plan:  HOME/SELF CARE      DC Planning Services  CM consult      Choice offered to / List presented to:             Status of service:  Completed, signed off Medicare Important Message given?   (If response is "NO", the following Medicare IM given date fields will be blank) Date Medicare IM given:   Medicare IM given by:   Date Additional Medicare IM given:   Additional Medicare IM given by:    Discharge Disposition:  HOME/SELF CARE  Per UR Regulation:  Reviewed for med. necessity/level of care/duration of stay  If discussed at Long Length of Stay Meetings, dates discussed:    Comments:  08/13/14 Lanier ClamKathy Christinia Lambeth RN BSN NCM [phone] given.Patient understands his obligation for co pay for scripts.D/C home no needs or orders.

## 2014-08-14 LAB — GLUCOSE, CAPILLARY: GLUCOSE-CAPILLARY: 401 mg/dL — AB (ref 70–99)

## 2014-08-14 LAB — TACROLIMUS LEVEL: Tacrolimus (FK506) - LabCorp: 4.8 ng/mL (ref 2.0–20.0)

## 2014-08-15 ENCOUNTER — Telehealth: Payer: Self-pay | Admitting: Internal Medicine

## 2014-08-22 ENCOUNTER — Emergency Department (HOSPITAL_COMMUNITY): Payer: Medicare Other

## 2014-08-22 ENCOUNTER — Encounter (HOSPITAL_COMMUNITY): Payer: Self-pay | Admitting: Emergency Medicine

## 2014-08-22 ENCOUNTER — Inpatient Hospital Stay (HOSPITAL_COMMUNITY)
Admission: EM | Admit: 2014-08-22 | Discharge: 2014-08-27 | DRG: 872 | Disposition: A | Discharge status: Home / Self Care | Payer: Medicare Other | Attending: Internal Medicine | Admitting: Internal Medicine

## 2014-08-22 DIAGNOSIS — Z940 Kidney transplant status: Secondary | ICD-10-CM | POA: Diagnosis not present

## 2014-08-22 DIAGNOSIS — K219 Gastro-esophageal reflux disease without esophagitis: Secondary | ICD-10-CM | POA: Diagnosis present

## 2014-08-22 DIAGNOSIS — E1143 Type 2 diabetes mellitus with diabetic autonomic (poly)neuropathy: Secondary | ICD-10-CM | POA: Diagnosis present

## 2014-08-22 DIAGNOSIS — E875 Hyperkalemia: Secondary | ICD-10-CM | POA: Diagnosis present

## 2014-08-22 DIAGNOSIS — Y830 Surgical operation with transplant of whole organ as the cause of abnormal reaction of the patient, or of later complication, without mention of misadventure at the time of the procedure: Secondary | ICD-10-CM | POA: Diagnosis present

## 2014-08-22 DIAGNOSIS — I120 Hypertensive chronic kidney disease with stage 5 chronic kidney disease or end stage renal disease: Secondary | ICD-10-CM | POA: Diagnosis present

## 2014-08-22 DIAGNOSIS — N189 Chronic kidney disease, unspecified: Secondary | ICD-10-CM

## 2014-08-22 DIAGNOSIS — A4151 Sepsis due to Escherichia coli [E. coli]: Principal | ICD-10-CM | POA: Diagnosis present

## 2014-08-22 DIAGNOSIS — E11319 Type 2 diabetes mellitus with unspecified diabetic retinopathy without macular edema: Secondary | ICD-10-CM | POA: Diagnosis present

## 2014-08-22 DIAGNOSIS — Z794 Long term (current) use of insulin: Secondary | ICD-10-CM | POA: Diagnosis not present

## 2014-08-22 DIAGNOSIS — I739 Peripheral vascular disease, unspecified: Secondary | ICD-10-CM | POA: Diagnosis present

## 2014-08-22 DIAGNOSIS — N10 Acute tubulo-interstitial nephritis: Secondary | ICD-10-CM

## 2014-08-22 DIAGNOSIS — E119 Type 2 diabetes mellitus without complications: Secondary | ICD-10-CM

## 2014-08-22 DIAGNOSIS — N390 Urinary tract infection, site not specified: Secondary | ICD-10-CM | POA: Diagnosis present

## 2014-08-22 DIAGNOSIS — R5383 Other fatigue: Secondary | ICD-10-CM | POA: Diagnosis present

## 2014-08-22 DIAGNOSIS — Z87891 Personal history of nicotine dependence: Secondary | ICD-10-CM | POA: Diagnosis not present

## 2014-08-22 DIAGNOSIS — I248 Other forms of acute ischemic heart disease: Secondary | ICD-10-CM | POA: Diagnosis present

## 2014-08-22 DIAGNOSIS — I10 Essential (primary) hypertension: Secondary | ICD-10-CM | POA: Diagnosis present

## 2014-08-22 DIAGNOSIS — N179 Acute kidney failure, unspecified: Secondary | ICD-10-CM | POA: Diagnosis present

## 2014-08-22 DIAGNOSIS — A419 Sepsis, unspecified organism: Secondary | ICD-10-CM | POA: Diagnosis present

## 2014-08-22 DIAGNOSIS — T8611 Kidney transplant rejection: Secondary | ICD-10-CM | POA: Diagnosis present

## 2014-08-22 DIAGNOSIS — Z833 Family history of diabetes mellitus: Secondary | ICD-10-CM

## 2014-08-22 DIAGNOSIS — T380X5A Adverse effect of glucocorticoids and synthetic analogues, initial encounter: Secondary | ICD-10-CM | POA: Diagnosis present

## 2014-08-22 DIAGNOSIS — D849 Immunodeficiency, unspecified: Secondary | ICD-10-CM | POA: Diagnosis present

## 2014-08-22 DIAGNOSIS — D899 Disorder involving the immune mechanism, unspecified: Secondary | ICD-10-CM

## 2014-08-22 DIAGNOSIS — E1165 Type 2 diabetes mellitus with hyperglycemia: Secondary | ICD-10-CM | POA: Diagnosis present

## 2014-08-22 DIAGNOSIS — Z89512 Acquired absence of left leg below knee: Secondary | ICD-10-CM

## 2014-08-22 DIAGNOSIS — N12 Tubulo-interstitial nephritis, not specified as acute or chronic: Secondary | ICD-10-CM | POA: Insufficient documentation

## 2014-08-22 DIAGNOSIS — Z7952 Long term (current) use of systemic steroids: Secondary | ICD-10-CM | POA: Diagnosis not present

## 2014-08-22 DIAGNOSIS — E861 Hypovolemia: Secondary | ICD-10-CM | POA: Diagnosis present

## 2014-08-22 LAB — COMPREHENSIVE METABOLIC PANEL
ALT: 15 U/L (ref 0–53)
ANION GAP: 9 (ref 5–15)
AST: 21 U/L (ref 0–37)
Albumin: 2.1 g/dL — ABNORMAL LOW (ref 3.5–5.2)
Alkaline Phosphatase: 101 U/L (ref 39–117)
BILIRUBIN TOTAL: 2.1 mg/dL — AB (ref 0.3–1.2)
BUN: 54 mg/dL — AB (ref 6–23)
CALCIUM: 8.7 mg/dL (ref 8.4–10.5)
CO2: 21 mmol/L (ref 19–32)
CREATININE: 2.97 mg/dL — AB (ref 0.50–1.35)
Chloride: 97 mmol/L (ref 96–112)
GFR calc non Af Amer: 22 mL/min — ABNORMAL LOW (ref >90)
GFR, EST AFRICAN AMERICAN: 26 mL/min — AB (ref >90)
GLUCOSE: 326 mg/dL — AB (ref 70–99)
Potassium: 6 mmol/L — ABNORMAL HIGH (ref 3.5–5.1)
Sodium: 127 mmol/L — ABNORMAL LOW (ref 135–145)
Total Protein: 6.4 g/dL (ref 6.0–8.3)

## 2014-08-22 LAB — CBC WITH DIFFERENTIAL/PLATELET
Basophils Absolute: 0 10*3/uL (ref 0.0–0.1)
Basophils Relative: 0 % (ref 0–1)
Eosinophils Absolute: 0 10*3/uL (ref 0.0–0.7)
Eosinophils Relative: 0 % (ref 0–5)
HCT: 36.1 % — ABNORMAL LOW (ref 39.0–52.0)
Hemoglobin: 11.9 g/dL — ABNORMAL LOW (ref 13.0–17.0)
Lymphocytes Relative: 8 % — ABNORMAL LOW (ref 12–46)
Lymphs Abs: 1.9 10*3/uL (ref 0.7–4.0)
MCH: 27.6 pg (ref 26.0–34.0)
MCHC: 33 g/dL (ref 30.0–36.0)
MCV: 83.8 fL (ref 78.0–100.0)
MONO ABS: 2.6 10*3/uL — AB (ref 0.1–1.0)
MONOS PCT: 11 % (ref 3–12)
NEUTROS ABS: 19.3 10*3/uL — AB (ref 1.7–7.7)
Neutrophils Relative %: 81 % — ABNORMAL HIGH (ref 43–77)
PLATELETS: 140 10*3/uL — AB (ref 150–400)
RBC: 4.31 MIL/uL (ref 4.22–5.81)
RDW: 14 % (ref 11.5–15.5)
WBC: 23.8 10*3/uL — ABNORMAL HIGH (ref 4.0–10.5)

## 2014-08-22 LAB — URINALYSIS, ROUTINE W REFLEX MICROSCOPIC
Glucose, UA: 250 mg/dL — AB
KETONES UR: 15 mg/dL — AB
NITRITE: NEGATIVE
Specific Gravity, Urine: 1.016 (ref 1.005–1.030)
Urobilinogen, UA: 1 mg/dL (ref 0.0–1.0)
pH: 5.5 (ref 5.0–8.0)

## 2014-08-22 LAB — URINE MICROSCOPIC-ADD ON

## 2014-08-22 LAB — CBG MONITORING, ED
GLUCOSE-CAPILLARY: 279 mg/dL — AB (ref 70–99)
Glucose-Capillary: 315 mg/dL — ABNORMAL HIGH (ref 70–99)

## 2014-08-22 LAB — I-STAT CG4 LACTIC ACID, ED: LACTIC ACID, VENOUS: 1.25 mmol/L (ref 0.5–2.0)

## 2014-08-22 LAB — POTASSIUM: Potassium: 4.5 mmol/L (ref 3.5–5.1)

## 2014-08-22 LAB — GLUCOSE, CAPILLARY: GLUCOSE-CAPILLARY: 328 mg/dL — AB (ref 70–99)

## 2014-08-22 MED ORDER — INSULIN ASPART 100 UNIT/ML IV SOLN
5.0000 [IU] | Freq: Once | INTRAVENOUS | Status: AC
  Administered 2014-08-22: 5 [IU] via INTRAVENOUS
  Filled 2014-08-22: qty 1

## 2014-08-22 MED ORDER — MYCOPHENOLATE SODIUM 180 MG PO TBEC
720.0000 mg | DELAYED_RELEASE_TABLET | Freq: Two times a day (BID) | ORAL | Status: DC
  Administered 2014-08-22 – 2014-08-27 (×10): 720 mg via ORAL
  Filled 2014-08-22 (×11): qty 4

## 2014-08-22 MED ORDER — POLYETHYLENE GLYCOL 3350 17 G PO PACK
17.0000 g | PACK | Freq: Every day | ORAL | Status: DC | PRN
  Filled 2014-08-22: qty 1

## 2014-08-22 MED ORDER — DEXTROSE 5 % IV SOLN
1.0000 g | INTRAVENOUS | Status: DC
  Administered 2014-08-23 – 2014-08-24 (×2): 1 g via INTRAVENOUS
  Filled 2014-08-22 (×3): qty 1

## 2014-08-22 MED ORDER — SODIUM CHLORIDE 0.9 % IV SOLN
1.0000 g | Freq: Once | INTRAVENOUS | Status: AC
  Administered 2014-08-22: 1 g via INTRAVENOUS
  Filled 2014-08-22: qty 10

## 2014-08-22 MED ORDER — HYDROCORTISONE NA SUCCINATE PF 100 MG IJ SOLR
50.0000 mg | Freq: Three times a day (TID) | INTRAMUSCULAR | Status: DC
  Administered 2014-08-22 – 2014-08-23 (×2): 50 mg via INTRAVENOUS
  Filled 2014-08-22 (×4): qty 1
  Filled 2014-08-22: qty 2

## 2014-08-22 MED ORDER — TACROLIMUS 1 MG PO CAPS
4.0000 mg | ORAL_CAPSULE | Freq: Two times a day (BID) | ORAL | Status: DC
  Administered 2014-08-22 – 2014-08-27 (×10): 4 mg via ORAL
  Filled 2014-08-22 (×13): qty 4

## 2014-08-22 MED ORDER — ACETAMINOPHEN 325 MG PO TABS
650.0000 mg | ORAL_TABLET | Freq: Once | ORAL | Status: AC
  Administered 2014-08-22: 650 mg via ORAL
  Filled 2014-08-22: qty 2

## 2014-08-22 MED ORDER — DEXTROSE 5 % IV SOLN: 1.0000 g | Freq: Once | INTRAVENOUS | Status: DC

## 2014-08-22 MED ORDER — VANCOMYCIN HCL 10 G IV SOLR
1500.0000 mg | INTRAVENOUS | Status: DC
  Filled 2014-08-22: qty 1500

## 2014-08-22 MED ORDER — SODIUM CHLORIDE 0.9 % IV BOLUS (SEPSIS)
500.0000 mL | Freq: Once | INTRAVENOUS | Status: AC
  Administered 2014-08-22: 500 mL via INTRAVENOUS

## 2014-08-22 MED ORDER — INSULIN ASPART 100 UNIT/ML ~~LOC~~ SOLN
0.0000 [IU] | Freq: Three times a day (TID) | SUBCUTANEOUS | Status: DC
  Administered 2014-08-23 (×2): 15 [IU] via SUBCUTANEOUS
  Administered 2014-08-23 – 2014-08-24 (×2): 8 [IU] via SUBCUTANEOUS
  Administered 2014-08-24: 11 [IU] via SUBCUTANEOUS
  Administered 2014-08-25: 15 [IU] via SUBCUTANEOUS
  Administered 2014-08-25: 5 [IU] via SUBCUTANEOUS

## 2014-08-22 MED ORDER — ACETAMINOPHEN 325 MG PO TABS
650.0000 mg | ORAL_TABLET | Freq: Four times a day (QID) | ORAL | Status: DC | PRN
  Administered 2014-08-23: 650 mg via ORAL
  Filled 2014-08-22: qty 2

## 2014-08-22 MED ORDER — SODIUM POLYSTYRENE SULFONATE 15 GM/60ML PO SUSP
45.0000 g | Freq: Once | ORAL | Status: AC
  Administered 2014-08-22: 45 g via ORAL
  Filled 2014-08-22: qty 180

## 2014-08-22 MED ORDER — INSULIN GLARGINE 100 UNIT/ML ~~LOC~~ SOLN
20.0000 [IU] | Freq: Every day | SUBCUTANEOUS | Status: DC
  Administered 2014-08-22 – 2014-08-23 (×2): 20 [IU] via SUBCUTANEOUS
  Filled 2014-08-22 (×4): qty 0.2

## 2014-08-22 MED ORDER — HEPARIN SODIUM (PORCINE) 5000 UNIT/ML IJ SOLN
5000.0000 [IU] | Freq: Three times a day (TID) | INTRAMUSCULAR | Status: DC
  Administered 2014-08-22 – 2014-08-27 (×14): 5000 [IU] via SUBCUTANEOUS
  Filled 2014-08-22 (×17): qty 1

## 2014-08-22 MED ORDER — SODIUM POLYSTYRENE SULFONATE 15 GM/60ML PO SUSP: 15.0000 g | Freq: Once | ORAL | Status: DC

## 2014-08-22 MED ORDER — DOCUSATE SODIUM 100 MG PO CAPS
100.0000 mg | ORAL_CAPSULE | Freq: Two times a day (BID) | ORAL | Status: DC
  Administered 2014-08-22 – 2014-08-23 (×3): 100 mg via ORAL
  Filled 2014-08-22 (×11): qty 1

## 2014-08-22 MED ORDER — VANCOMYCIN HCL 10 G IV SOLR
2000.0000 mg | Freq: Once | INTRAVENOUS | Status: AC
  Administered 2014-08-22: 2000 mg via INTRAVENOUS
  Filled 2014-08-22: qty 2000

## 2014-08-22 MED ORDER — DEXTROSE 5 % IV SOLN
2.0000 g | Freq: Once | INTRAVENOUS | Status: AC
  Administered 2014-08-22: 2 g via INTRAVENOUS
  Filled 2014-08-22: qty 2

## 2014-08-22 MED ORDER — ACETAMINOPHEN 650 MG RE SUPP: 650.0000 mg | Freq: Four times a day (QID) | RECTAL | Status: DC | PRN

## 2014-08-22 MED ORDER — SODIUM CHLORIDE 0.9 % IV SOLN
INTRAVENOUS | Status: DC
  Administered 2014-08-22 – 2014-08-24 (×4): via INTRAVENOUS

## 2014-08-22 NOTE — ED Notes (Addendum)
Pt had large BM, at this time pt bp 87/48, re-checked 3 times to assure accuracy. PA and MD notified.

## 2014-08-22 NOTE — H&P (Signed)
Patient Demographics  Trevor Thompson, is a 54 y.o. male  MRN: [id number]   DOB - August 11, 1960  Admit Date - 08/22/2014  Outpatient Primary MD for the patient is Jeanann Lewandowsky, MD   With History of -  Past Medical History  Diagnosis Date  . Hypertension   . PVD (peripheral vascular disease)   . S/p cadaver renal transplant   . Cold intolerance   . 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 GERD (gastroesophageal reflux disease)   . 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 Hypertriglyceridemia   . Diabetic retinopathy   . Sleep apnea     in the past .  Was cleared in 2004 to not wear CPap.  Dr in [place]  . High cholesterol   . Pneumonia 05/09/2013    "first time was today" (05/09/2013)  . Type II diabetes mellitus   . ESRD (end stage renal disease)     "no dialysis; had kidney transplant" (05/09/2013)      Past Surgical History  Procedure Laterality Date  . Kidney transplant Right 2004  . Cataract extraction w/ intraocular lens  implant, bilateral Bilateral ~ 1997  . Av fistula placement  1997-2004    "I've got 3; right upper arm; right forearm; left forearm "  . Av fistula repair  1997-2004    "multiple"  . Amputation  02/25/2012    Procedure: AMPUTATION DIGIT;  Surgeon: Larina Earthly, MD;  Location: [facility] OR;  Service: Vascular;  Laterality: Left;  Left 2nd Toe Amputation, Incision and Drainage left lateral foot.  . Amputation  03/22/2012    Procedure: AMPUTATION BELOW KNEE;  Surgeon: Nadara Mustard, MD;  Location: [facility] OR;  Service: Orthopedics;  Laterality: Left;  Left Below Knee Amputation  . Amputation  04/21/2012    Procedure: AMPUTATION BELOW KNEE;  Surgeon: Nadara Mustard, MD;  Location: [facility] OR;  Service: Orthopedics;  Laterality: Left;  Left below knee amputation revision  . Abdominal aortagram N/A 02/22/2012    Procedure: ABDOMINAL Ronny Flurry;  Surgeon: Nada Libman, MD;  Location: [facility] CATH LAB;  Service: Cardiovascular;  Laterality: N/A;    in for   Chief Complaint    Patient presents with  . Fatigue     HPI  Trevor Thompson  is a 54 y.o. male, with history of renal transplant, diabetes mellitus, hypertension, peripheral vascular disease, who presents to ED complaining of generalized weakness, fatigue, not feeling well over the last 5 days, and reports having some fever and chills at home, abdominal pain, intermittent nausea but no vomiting, patient reports has been seen by his PCP with negative rapid flu test, upon presentation to ED patient was tachycardic, febrile 100.6, with a leukocytosis of 23,000, urine analysis was positive, chest x-ray no acute findings, significant tenderness in the right lower abdomen at transplant site, patient reports he has not been taking his immunosuppressive medication over the last 2 days as he is not feeling well, patient has worsening in his renal function with creatinine of 2.9, and hyperkalemia with potassium of 6.    Review of Systems    In addition to the HPI above,  Reports Fever-chills, No Headache, No changes with Vision or hearing, No problems swallowing food or Liquids, No Chest pain, Cough or Shortness of Breath, Complaints of Abdominal pain, and Nausea but no Vommitting, Bowel movements are regular, No Blood in stool or Urine, Reports dysuria, No new skin rashes or bruises, No new joints pains-aches,  No  new focal weakness, tingling, numbness in any extremity, reports generalized weakness No recent weight gain or loss, No polyuria, polydypsia or polyphagia, No significant Mental Stressors.  A full 10 point Review of Systems was done, except as stated above, all other Review of Systems were negative.   Social History History  Substance Use Topics  . Smoking status: Former Smoker -- 0.30 packs/day for 3 years    Types: Cigarettes    Quit date: 07/05/1981  . Smokeless tobacco: Never Used  . Alcohol Use: 1.8 oz/week    3 Cans of beer per week     Family History Family History  Problem Relation  Age of Onset  . Diabetes Mother   . Diabetes Father   . Lupus Sister      Prior to Admission medications   Medication Sig Start Date End Date Taking? Authorizing Provider  furosemide (LASIX) 40 MG tablet Take 20 mg by mouth daily.   Yes Historical Provider, MD  insulin glargine (LANTUS) 100 UNIT/ML injection Inject 20 Units into the skin at bedtime.   Yes Historical Provider, MD  metoprolol tartrate (LOPRESSOR) 25 MG tablet Take 1 tablet (25 mg total) by mouth 2 (two) times daily. 09/07/13  Yes Richarda Overlie, MD  mycophenolate (MYFORTIC) 360 MG TBEC EC tablet Take 720 mg by mouth 2 (two) times daily. Take 2 Tablets (720 mg) By Mouth BID.   Yes Historical Provider, MD  predniSONE (DELTASONE) 5 MG tablet Take 5 mg by mouth daily.   Yes Historical Provider, MD  tacrolimus (PROGRAF) 1 MG capsule Take 4 mg by mouth 2 (two) times daily. Take 4 Tablets (8 mg) By Mouth BID.   Yes Historical Provider, MD  insulin aspart protamine- aspart (NOVOLOG MIX 70/30) (70-30) 100 UNIT/ML injection Inject 0.2 mLs (20 Units total) into the skin 2 (two) times daily with a meal. Patient not taking: Reported on 08/12/2014 06/06/13   Richarda Overlie, MD    Allergies  Allergen Reactions  . Penicillins Other (See Comments)    Since birth    Physical Exam  Vitals  Blood pressure 137/97, pulse 114, temperature 100.6 F (38.1 C), temperature source Rectal, resp. rate 17, SpO2 94 %.   1. General ill-appearing male lying in bed .  2. Normal affect and insight, Not Suicidal or Homicidal, Awake Alert, Oriented X 3.  3. No F.N deficits, ALL C.Nerves Intact, Strength 5/5 all 4 extremities(left BKA), Sensation intact all 4 extremities, Plantars down going.  4. Ears and Eyes appear Normal, Conjunctivae clear, PERRLA. Dry Oral Mucosa.  5. Supple Neck, No JVD, No cervical lymphadenopathy appriciated, No Carotid Bruits.  6. Symmetrical Chest wall movement, Good air movement bilaterally, CTAB.  7. Tachycardic, No Gallops,  Rubs or Murmurs, No Parasternal Heave.  8. Positive Bowel Sounds, Abdomen Soft, tenderness at the right lower abdomen which is his transplant site, No organomegaly appriciated,No rebound -guarding or rigidity.  9.  No Cyanosis, Normal Skin Turgor, No Skin Rash or Bruise.  10. Good muscle tone,  joints appear normal , no effusions, Normal ROM, left BKA  11. No Palpable Lymph Nodes in Neck or Axillae    Data Review  CBC  Recent Labs Lab 08/22/14 1624  WBC 23.8*  HGB 11.9*  HCT 36.1*  PLT 140*  MCV 83.8  MCH 27.6  MCHC 33.0  RDW 14.0  LYMPHSABS 1.9  MONOABS 2.6*  EOSABS 0.0  BASOSABS 0.0   ------------------------------------------------------------------------------------------------------------------  Chemistries   Recent Labs Lab 08/22/14 1624  NA 127*  K 6.0*  CL 97  CO2 21  GLUCOSE 326*  BUN 54*  CREATININE 2.97*  CALCIUM 8.7  AST 21  ALT 15  ALKPHOS 101  BILITOT 2.1*   ------------------------------------------------------------------------------------------------------------------ estimated creatinine clearance is 36.8 mL/min (by C-G formula based on Cr of 2.97). ------------------------------------------------------------------------------------------------------------------ No results for input(s): TSH, T4TOTAL, T3FREE, THYROIDAB in the last 72 hours.  Invalid input(s): FREET3   Coagulation profile No results for input(s): INR, PROTIME in the last 168 hours. ------------------------------------------------------------------------------------------------------------------- No results for input(s): DDIMER in the last 72 hours. -------------------------------------------------------------------------------------------------------------------  Cardiac Enzymes No results for input(s): CKMB, TROPONINI, MYOGLOBIN in the last 168 hours.  Invalid input(s):  CK ------------------------------------------------------------------------------------------------------------------ Invalid input(s): POCBNP   ---------------------------------------------------------------------------------------------------------------  Urinalysis    Component Value Date/Time   COLORURINE YELLOW 08/22/2014 1658   APPEARANCEUR CLOUDY* 08/22/2014 1658   LABSPEC 1.016 08/22/2014 1658   PHURINE 5.5 08/22/2014 1658   GLUCOSEU 250* 08/22/2014 1658   HGBUR LARGE* 08/22/2014 1658   BILIRUBINUR SMALL* 08/22/2014 1658   BILIRUBINUR neg 09/07/2013 1400   KETONESUR 15* 08/22/2014 1658   PROTEINUR >300* 08/22/2014 1658   PROTEINUR 300 09/07/2013 1400   UROBILINOGEN 1.0 08/22/2014 1658   UROBILINOGEN 0.2 09/07/2013 1400   NITRITE NEGATIVE 08/22/2014 1658   NITRITE neg 09/07/2013 1400   LEUKOCYTESUR MODERATE* 08/22/2014 1658    ----------------------------------------------------------------------------------------------------------------  Imaging results:   No results found.  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 - Continue with IV fluids, Tylenol as needed for fever - Given patient is immunocompromised will start on broad-spectrum IV antibiotics including IV vancomycin and cefepime - Given patient is a steroid dependent secondary to chronic steroid, will start him on stress dose hydrocortisone 50 mg every 8 hours  Acute  pyelonephritis - Has tenderness in the right lower abdomen which is transplant site. - Continue with vancomycin and cefepime, would down grade antibiotic when patient is more stable and have more information from culture  Acute renal failure - Unclear if secondary to volume depletion from dehydration versus transplant rejection as patient was not taking any medicine for couple days. - Will check renal transplant ultrasound - We'll start on stress dose IV steroid hydrocortisone 50 every 8 hours, will resume patient back on immunosuppressive medication, discussed with Dr. Lowell Guitar who will see the patient  Hyperkalemia - Secondary to renal failure, no EKG changes, given IV calcium gluconate, Kayexalate, IV insulin, will recheck level after bowel movement. Repeat BMP in a.m..  Diabetes mellitus - We'll resume back on Lantus, we'll hold Novolin 70/30, will start on moderate insulin sliding scale.   DVT Prophylaxis Heparin -   AM Labs Ordered, also please review Full Orders  Family Communication: Admission, patients condition and plan of care including tests being ordered have been discussed with the patient  who indicate understanding and agree with the plan and Code Status.  Code Status full  Likely DC to  home once stable  Condition GUARDED    Time spent in  minutes : 60 minutes    Dalyce Renne M.D on 08/22/2014 at 6:26 PM  Between 7am to 7pm - Pager - [phone]  After 7pm go to [email or web address] - password [email or web address]  And look for the night coverage person covering me after hours  [facility] Office  [phone]   **Disclaimer: This note may have been dictated with voice recognition software. Similar sounding words can inadvertently be transcribed and this note may contain transcription errors which may not have been corrected upon publication of note.**

## 2014-08-22 NOTE — Progress Notes (Signed)
ANTIBIOTIC CONSULT NOTE - INITIAL  Pharmacy Consult for vancomycin + cefepime Indication: rule out sepsis  Allergies  Allergen Reactions  . Penicillins Other (See Comments)    Since birth    Patient Measurements:   Adjusted Body Weight:   Vital Signs: Temp: 100 F (37.8 C) (02/18 1827) Temp Source: Oral (02/18 1827) BP: 137/97 mmHg (02/18 1715) Pulse Rate: 114 (02/18 1715) Intake/Output from previous day:   Intake/Output from this shift:    Labs:  Recent Labs  08/22/14 1624  WBC 23.8*  HGB 11.9*  PLT 140*  CREATININE 2.97*   Estimated Creatinine Clearance: 36.8 mL/min (by C-G formula based on Cr of 2.97). No results for input(s): VANCOTROUGH, VANCOPEAK, VANCORANDOM, GENTTROUGH, GENTPEAK, GENTRANDOM, TOBRATROUGH, TOBRAPEAK, TOBRARND, AMIKACINPEAK, AMIKACINTROU, AMIKACIN in the last 72 hours.   Microbiology: No results found for this or any previous visit (from the past 720 hour(s)).  Medical History: Past Medical History  Diagnosis Date  . Hypertension   . PVD (peripheral vascular disease)   . S/p cadaver renal transplant   . Cold intolerance   . 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 GERD (gastroesophageal reflux disease)   . 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 Hypertriglyceridemia   . Diabetic retinopathy   . Sleep apnea     in the past .  Was cleared in 2004 to not wear CPap.  [place] in [place]  . High cholesterol   . Pneumonia 05/09/2013    "first time was today" (05/09/2013)  . Type II diabetes mellitus   . ESRD (end stage renal disease)     "no dialysis; had kidney transplant" (05/09/2013)    Medications:  Anti-infectives    Start     Dose/Rate Route Frequency Ordered Stop   08/24/14 2000  vancomycin (VANCOCIN) 1,500 mg in sodium chloride 0.9 % 500 mL IVPB     1,500 mg 250 mL/hr over 120 Minutes Intravenous Every 48 hours 08/22/14 1852     08/23/14 2000  ceFEPIme (MAXIPIME) 1 g in dextrose 5 % 50 mL IVPB     1 g 100 mL/hr over 30 Minutes  Intravenous Every 24 hours 08/22/14 1852     08/22/14 1830  vancomycin (VANCOCIN) 2,000 mg in sodium chloride 0.9 % 500 mL IVPB     2,000 mg 250 mL/hr over 120 Minutes Intravenous  Once 08/22/14 1818     08/22/14 1830  ceFEPIme (MAXIPIME) 2 g in dextrose 5 % 50 mL IVPB     2 g 100 mL/hr over 30 Minutes Intravenous  Once 08/22/14 1823     08/22/14 1745  cefTRIAXone (ROCEPHIN) 1 g in dextrose 5 % 50 mL IVPB  Status:  Discontinued     1 g 100 mL/hr over 30 Minutes Intravenous  Once 08/22/14 1743 08/22/14 1822     Assessment: 53 yom presented to the ED with fatigue. To start broad-spectrum antibiotics for possible sepsis. Scr is elevated above baseline. Known history of renal transplant. Tmax is 100.6 and WBC is 23.8. To start vancomycin and cefepime (MD aware of PCN allergy but due to low risk of cross-reactivity will go ahead with cephalosporin).   Vanc 2/18>> Cefepime 2/18>>  Goal of Therapy:  Vancomycin trough level 15-20 mcg/ml  Plan:  1. Vancomycin 2gm IV x 1 then 1500mg  IV Q48H 2. Cefepime 2gm IV x 1 then 1gm IV Q24H 3. F/u renal fxn, C&S, clinical [facility]  Lynasia Meloche, Drake Leachachel Lynn 08/22/2014,6:52 PM

## 2014-08-22 NOTE — ED Notes (Signed)
Pt states something is stuck in his throat

## 2014-08-22 NOTE — ED Notes (Signed)
Pt here for generalized weakness and cold symptoms. Pt kidney transplant 12 years ago. 98.1 tympanic temp. Pt went to renal doc yesterday, neg for flu. Blood cultures done at renal doc yesterday. WBC elevated according to doctor per pt. Pt states has not been taking medications as he is supposed to. A/o x4

## 2014-08-22 NOTE — ED Notes (Signed)
Transporting patient to new room assignment. 

## 2014-08-22 NOTE — ED Provider Notes (Signed)
  Face-to-face evaluation   History: He complains of chills, and malaise for several days.  He's been taking his toe trauma is been high.  He is using anything for the fever.  Physical exam: Alert, calm, cooperative.  Abdomen soft, nontender to light palpation.  Medical screening examination/treatment/procedure(s) were conducted as a shared visit with non-physician practitioner(s) and myself.  I personally evaluated the patient during the encounter   Flint MelterElliott L Torrian Canion, MD 08/23/14 [phone]

## 2014-08-22 NOTE — Consult Note (Signed)
HPI: I was asked by Dr. Eulis Foster to see Trevor Thompson who is a 54 y.o. male who is s/p DD extended criteria kidney transplant 2004 at [facility] in [place].  He presents with several days of malaise and fever  At home temp was up to 102 degrees.  He was seen in my office on 2/17(yesterday) by Ernest Haber, PAc and evaluation included a WBC of 26,900 w left shift,BS of 351, creat of 2.26.  His creat on 12/1 was 1.38m/dl.  He could not give uKoreaa urine specimen at [facility] and did not go for the CXR either. Blood cultures were done via [facility].  Pt reports he has been out of prograf for a couple of days.  Of note, in January 2015 he had an episode of AKI associated with E. Coli bacteremia and pyelonephritis.  He was asked to go to the emergency room for evaluation due to his febrile illness.  He denies dysuria but reports frequent urination.  He c/o fatigue and malaise. He has a left BKA.  Past Medical History  Diagnosis Date  . Hypertension   . PVD (peripheral vascular disease)   . S/p cadaver renal transplant   . Cold intolerance   . 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 GERD (gastroesophageal reflux disease)   . 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 Hypertriglyceridemia   . Diabetic retinopathy   . Sleep apnea     in the past .  Was cleared in 2004 to not wear CPap.  [place] . High cholesterol   . Pneumonia 05/09/2013    "first time was today" (05/09/2013)  . Type II diabetes mellitus   . ESRD (end stage renal disease)     "no dialysis; had kidney transplant" (05/09/2013)   Past Surgical History  Procedure Laterality Date  . Kidney transplant Right 2004  . Cataract extraction w/ intraocular lens  implant, bilateral Bilateral ~ 1997  . Av fistula placement  1997-2004    "I've got 3; right upper arm; right forearm; left forearm "  . Av fistula repair  1997-2004    "multiple"  . Amputation  02/25/2012    Procedure: AMPUTATION DIGIT;  Surgeon: TRosetta Posner MD;   [facility];  Service: Vascular;  Laterality: Left;  Left 2nd Toe Amputation, Incision and Drainage left lateral foot.  . Amputation  03/22/2012    Procedure: AMPUTATION BELOW KNEE;  Surgeon: MNewt Minion MD;  [place]  Service: Orthopedics;  Laterality: Left;  Left Below Knee Amputation  . Amputation  04/21/2012    Procedure: AMPUTATION BELOW KNEE;  Surgeon: MNewt Minion MD;  [place] [facility]  Service: Orthopedics;  Laterality: Left;  Left below knee amputation revision  . Abdominal aortagram N/A 02/22/2012    Procedure: ABDOMINAL AMaxcine Ham  Surgeon: VSerafina Mitchell MD;  [facility];  Service: Cardiovascular;  Laterality: N/A;   Social History:  reports that he quit smoking about 33 years ago. His smoking use included Cigarettes. He has a .9 pack-year smoking history. He has never used smokeless tobacco. He reports that he drinks about 1.8 oz of alcohol per week. He reports that he uses illicit drugs. Allergies:  Allergies  Allergen Reactions  . Penicillins Other (See Comments)    Since birth   Family History  Problem Relation Age of Onset  . Diabetes Mother   . Diabetes Father   . Lupus Sister     Medications:  Prior to Admission:  (Not in a hospital admission) Scheduled: . hydrocortisone sod succinate (SOLU-CORTEF) inj  50 mg Intravenous Q8H  . insulin aspart  5 Units Intravenous Once  . sodium polystyrene  45 g Oral Once     HKV:[id number] as per HPI  Blood pressure 137/97, pulse 114, temperature 100.6 F (38.1 C), temperature source Rectal, resp. rate 17, SpO2 94 %.  General appearance: alert, cooperative and ill appearing Head: Normocephalic, without obvious abnormality, atraumatic Eyes: negative Ears: normal TM's and external ear canals both ears Nose: Nares normal. Septum midline. Mucosa normal. No drainage or sinus tenderness. Throat: lips, mucosa, and tongue normal; teeth and gums normal Resp: clear to auscultation bilaterally Chest  wall: no tenderness Cardio: regular rate and rhythm, S1, S2 normal, no murmur, click, rub or gallop GI: soft, non-tender; bowel sounds normal; no masses,  no organomegaly and tender renal allograft in the RLQ Extremities: extremities normal, atraumatic, no cyanosis or edema and left BKA with healthy stump Skin: Skin color, texture, turgor normal. No rashes or lesions or dry skin Neurologic: Grossly normal Results for orders placed or performed during the hospital encounter of 08/22/14 (from the past 48 hour(s))  Comprehensive metabolic panel     Status: Abnormal   Collection Time: 08/22/14  4:24 PM  Result Value Ref Range   Sodium 127 (L) 135 - 145 mmol/L   Potassium 6.0 (H) 3.5 - 5.1 mmol/L    Comment: HEMOLYSIS AT THIS LEVEL MAY AFFECT RESULT   Chloride 97 96 - 112 mmol/L   CO2 21 19 - 32 mmol/L   Glucose, Bld 326 (H) 70 - 99 mg/dL   BUN 54 (H) 6 - 23 mg/dL   Creatinine, Ser 2.97 (H) 0.50 - 1.35 mg/dL   Calcium 8.7 8.4 - 10.5 mg/dL   Total Protein 6.4 6.0 - 8.3 g/dL   Albumin 2.1 (L) 3.5 - 5.2 g/dL   AST 21 0 - 37 U/L   ALT 15 0 - 53 U/L   Alkaline Phosphatase 101 39 - 117 U/L   Total Bilirubin 2.1 (H) 0.3 - 1.2 mg/dL   GFR calc non Af Amer 22 (L) >90 mL/min   GFR calc Af Amer 26 (L) >90 mL/min    Comment: (NOTE) The eGFR has been calculated using the CKD EPI equation. This calculation has not been validated in all clinical situations. eGFR's persistently <90 mL/min signify possible Chronic Kidney Disease.    Anion gap 9 5 - 15  CBC with Differential     Status: Abnormal   Collection Time: 08/22/14  4:24 PM  Result Value Ref Range   WBC 23.8 (H) 4.0 - 10.5 K/uL   RBC 4.31 4.22 - 5.81 MIL/uL   Hemoglobin 11.9 (L) 13.0 - 17.0 g/dL   HCT 36.1 (L) 39.0 - 52.0 %   MCV 83.8 78.0 - 100.0 fL   MCH 27.6 26.0 - 34.0 pg   MCHC 33.0 30.0 - 36.0 g/dL   RDW 14.0 11.5 - 15.5 %   Platelets 140 (L) 150 - 400 K/uL    Comment: REPEATED TO VERIFY SPECIMEN CHECKED FOR CLOTS PLATELET  COUNT CONFIRMED BY SMEAR    Neutrophils Relative % 81 (H) 43 - 77 %   Lymphocytes Relative 8 (L) 12 - 46 %   Monocytes Relative 11 3 - 12 %   Eosinophils Relative 0 0 - 5 %   Basophils Relative 0 0 - 1 %   Neutro Abs 19.3 (H) 1.7 - 7.7 K/uL  Lymphs Abs 1.9 0.7 - 4.0 K/uL   Monocytes Absolute 2.6 (H) 0.1 - 1.0 K/uL   Eosinophils Absolute 0.0 0.0 - 0.7 K/uL   Basophils Absolute 0.0 0.0 - 0.1 K/uL   Smear Review MORPHOLOGY UNREMARKABLE   Urinalysis, Routine w reflex microscopic     Status: Abnormal   Collection Time: 08/22/14  4:58 PM  Result Value Ref Range   Color, Urine YELLOW YELLOW   APPearance CLOUDY (A) CLEAR   Specific Gravity, Urine 1.016 1.005 - 1.030   pH 5.5 5.0 - 8.0   Glucose, UA 250 (A) NEGATIVE mg/dL   Hgb urine dipstick LARGE (A) NEGATIVE   Bilirubin Urine SMALL (A) NEGATIVE   Ketones, ur 15 (A) NEGATIVE mg/dL   Protein, ur >300 (A) NEGATIVE mg/dL   Urobilinogen, UA 1.0 0.0 - 1.0 mg/dL   Nitrite NEGATIVE NEGATIVE   Leukocytes, UA MODERATE (A) NEGATIVE  Urine microscopic-add on     Status: Abnormal   Collection Time: 08/22/14  4:58 PM  Result Value Ref Range   Squamous Epithelial / LPF RARE RARE   WBC, UA 21-50 <3 WBC/hpf   RBC / HPF 11-20 <3 RBC/hpf   Bacteria, UA MANY (A) RARE   Casts GRANULAR CAST (A) NEGATIVE   No results found.  Assessment:  1 Probable UTI of the transplanted kidney and sepsis 2 ESRD s/p DDKT 3 AKI, prob pyelo, but treat for acute rejection due to missing anti-rejection meds 4 Immunocompromised state  Plan: 1 Broad spectrum antibiotics pending C&S 2 Stress steroids 3 Transplant renal ultrasound 4 IVF hydration 5 If recurrent urosepsis will need cysto, and probably suppressive antibiotic Rx 6 F/U blood cultures done at [facility] on 2/17  Lossie Kalp C 08/22/2014, 6:19 PM

## 2014-08-22 NOTE — ED Provider Notes (Signed)
CSN: [id number]     Arrival date & time 08/22/14  1519 History   First MD Initiated Contact with Patient 08/22/14 1526     Chief Complaint  Patient presents with  . Fatigue   Trevor Thompson is a 54 y.o. male with a history of renal transplant, diabetes, hypertension and peripheral vascular disease who presents the emergency department complaining of generalized weakness and fatigue for the past 5 days. Patient reports that beginning 5 days ago he started having generalized body aches headache and loss of appetite. He reports intermittent nausea and abdominal pain but denies any currently. He reports he's not had much to eat and 5 days and has had little to drink. He also reports he is out of his Prograf and has not taken any medications in 2 days. The patient is not on dialysis currently, but he reports his nephrologist reports his kidney function is declining. He reports he had a rapid flu test at his doctor's office which was -2 days ago. He reports nasal congestion but denies any cough, chest pain or shortness of breath. He reports dysuria 2 days ago that has since resolved. The patient denies abdominal pain, vomiting, diarrhea, chest pain, shortness of breath, palpitations, cough, rashes, ear pain or sore throat.  (Consider location/radiation/quality/duration/timing/severity/associated sxs/prior Treatment) HPI  Past Medical History  Diagnosis Date  . Hypertension   . PVD (peripheral vascular disease)   . S/p cadaver renal transplant   . Cold intolerance   . 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 GERD (gastroesophageal reflux disease)   . 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 Hypertriglyceridemia   . Diabetic retinopathy   . Sleep apnea     in the past .  Was cleared in 2004 to not wear CPap.  Dr in [place]  . High cholesterol   . Pneumonia 05/09/2013    "first time was today" (05/09/2013)  . Type II diabetes mellitus   . ESRD (end stage renal disease)     "no dialysis; had kidney  transplant" (05/09/2013)   Past Surgical History  Procedure Laterality Date  . Kidney transplant Right 2004  . Cataract extraction w/ intraocular lens  implant, bilateral Bilateral ~ 1997  . Av fistula placement  1997-2004    "I've got 3; right upper arm; right forearm; left forearm "  . Av fistula repair  1997-2004    "multiple"  . Amputation  02/25/2012    Procedure: AMPUTATION DIGIT;  Surgeon: Larina Earthly, MD;  [facility];  Service: Vascular;  Laterality: Left;  Left 2nd Toe Amputation, Incision and Drainage left lateral foot.  . Amputation  03/22/2012    Procedure: AMPUTATION BELOW KNEE;  Surgeon: Nadara Mustard, MD;  Location: [facility];  Service: Orthopedics;  Laterality: Left;  Left Below Knee Amputation  . Amputation  04/21/2012    Procedure: AMPUTATION BELOW KNEE;  Surgeon: Nadara Mustard, MD;  Location: [facility] OR;  Service: Orthopedics;  Laterality: Left;  Left below knee amputation revision  . Abdominal aortagram N/A 02/22/2012    Procedure: ABDOMINAL Ronny Flurry;  Surgeon: Nada Libman, MD;  [facility];  Service: Cardiovascular;  Laterality: N/A;   Family History  Problem Relation Age of Onset  . Diabetes Mother   . Diabetes Father   . Lupus Sister    History  Substance Use Topics  . Smoking status: Former Smoker -- 0.30 packs/day for 3 years    Types: Cigarettes    Quit date: 07/05/1981  .  Smokeless tobacco: Never Used  . Alcohol Use: 1.8 oz/week    3 Cans of beer per week    Review of Systems  Constitutional: Positive for chills, appetite change and fatigue.  HENT: Positive for congestion. Negative for ear pain, rhinorrhea and trouble swallowing.   Eyes: Negative for pain and visual disturbance.  Respiratory: Negative for cough, shortness of breath and wheezing.   Cardiovascular: Negative for chest pain, palpitations and leg swelling.  Gastrointestinal: Positive for nausea and abdominal pain. Negative for vomiting and diarrhea.  Genitourinary: Positive for  dysuria. Negative for frequency, hematuria, flank pain and difficulty urinating.  Musculoskeletal: Negative for back pain and neck pain.  Skin: Negative for rash.  Neurological: Positive for weakness and headaches. Negative for dizziness, syncope and numbness.      Allergies  Penicillins  Home Medications   Prior to Admission medications   Medication Sig Start Date End Date Taking? Authorizing Provider  furosemide (LASIX) 40 MG tablet Take 20 mg by mouth daily.   Yes Historical Provider, MD  insulin glargine (LANTUS) 100 UNIT/ML injection Inject 20 Units into the skin at bedtime.   Yes Historical Provider, MD  metoprolol tartrate (LOPRESSOR) 25 MG tablet Take 1 tablet (25 mg total) by mouth 2 (two) times daily. 09/07/13  Yes Richarda OverlieNayana Abrol, MD  mycophenolate (MYFORTIC) 360 MG TBEC EC tablet Take 720 mg by mouth 2 (two) times daily. Take 2 Tablets (720 mg) By Mouth BID.   Yes Historical Provider, MD  predniSONE (DELTASONE) 5 MG tablet Take 5 mg by mouth daily.   Yes Historical Provider, MD  tacrolimus (PROGRAF) 1 MG capsule Take 4 mg by mouth 2 (two) times daily. Take 4 Tablets (8 mg) By Mouth BID.   Yes Historical Provider, MD  insulin aspart protamine- aspart (NOVOLOG MIX 70/30) (70-30) 100 UNIT/ML injection Inject 0.2 mLs (20 Units total) into the skin 2 (two) times daily with a meal. Patient not taking: Reported on 08/12/2014 06/06/13   Richarda OverlieNayana Abrol, MD   BP 137/97 mmHg  Pulse 114  Temp(Src) 100.6 F (38.1 C) (Rectal)  Resp 17  SpO2 94% Physical Exam  Constitutional: He is oriented to person, place, and time. He appears well-developed and well-nourished. No distress.  HENT:  Head: Normocephalic and atraumatic.  Right Ear: External ear normal.  Left Ear: External ear normal.  Mouth/Throat: Oropharynx is clear and moist. No oropharyngeal exudate.  Eyes: Conjunctivae are normal. Pupils are equal, round, and reactive to light. Right eye exhibits no discharge. Left eye exhibits no  discharge.  Neck: Normal range of motion. Neck supple. No JVD present.  Cardiovascular: Normal heart sounds and intact distal pulses.   Irregular heart rate. Tachy in the 110s. Bilateral radial pulses intact.   Pulmonary/Chest: Effort normal and breath sounds normal. No respiratory distress. He has no wheezes. He has no rales.  Abdominal: Soft. Bowel sounds are normal. He exhibits no distension and no mass. There is no tenderness. There is no rebound and no guarding.  Abdomen is soft and non-tender to palpation.  Right CVA tenderness.   Musculoskeletal: He exhibits no edema.  Left below the knee amputation. No right lower extremity swelling or tenderness. Right DP and PT pulses intact.   Lymphadenopathy:    He has no cervical adenopathy.  Neurological: He is alert and oriented to person, place, and time. Coordination normal.  Skin: Skin is warm and dry. No rash noted. He is not diaphoretic. No erythema. No pallor.  Psychiatric: He has a normal  mood and affect. His behavior is normal.  Nursing note and vitals reviewed.   ED Course  Procedures (including critical care time) Labs Review Labs Reviewed  COMPREHENSIVE METABOLIC PANEL - Abnormal; Notable for the following:    Sodium 127 (*)    Potassium 6.0 (*)    Glucose, Bld 326 (*)    BUN 54 (*)    Creatinine, Ser 2.97 (*)    Albumin 2.1 (*)    Total Bilirubin 2.1 (*)    GFR calc non Af Amer 22 (*)    GFR calc Af Amer 26 (*)    All other components within normal limits  CBC WITH DIFFERENTIAL/PLATELET - Abnormal; Notable for the following:    WBC 23.8 (*)    Hemoglobin 11.9 (*)    HCT 36.1 (*)    Platelets 140 (*)    Neutrophils Relative % 81 (*)    Lymphocytes Relative 8 (*)    Neutro Abs 19.3 (*)    Monocytes Absolute 2.6 (*)    All other components within normal limits  URINALYSIS, ROUTINE W REFLEX MICROSCOPIC - Abnormal; Notable for the following:    APPearance CLOUDY (*)    Glucose, UA 250 (*)    Hgb urine dipstick LARGE  (*)    Bilirubin Urine SMALL (*)    Ketones, ur 15 (*)    Protein, ur >300 (*)    Leukocytes, UA MODERATE (*)    All other components within normal limits  URINE MICROSCOPIC-ADD ON - Abnormal; Notable for the following:    Bacteria, UA MANY (*)    Casts GRANULAR CAST (*)    All other components within normal limits  URINE CULTURE  CULTURE, BLOOD (ROUTINE X 2)  CULTURE, BLOOD (ROUTINE X 2)    Imaging Review No results found.   EKG Interpretation   Date/Time:  Thursday August 22 2014 15:37:23 EST Ventricular Rate:  118 PR Interval:  168 QRS Duration: 83 QT Interval:  328 QTC Calculation: 459 R Axis:   70 Text Interpretation:  Sinus tachycardia Atrial premature complex Low  voltage, precordial leads Probable anteroseptal infarct, old Borderline ST  elevation, lateral leads Since last tracing rate faster Confirmed by [facility]   MD, ELLIOTT [phone]) on 08/22/2014 3:57:32 PM      Filed Vitals:   08/22/14 1600 08/22/14 1628 08/22/14 1700 08/22/14 1715  BP: 156/96  135/81 137/97  Pulse: 90  111 114  Temp:  100.6 F (38.1 C)    TempSrc:  Rectal    Resp: 30  20 17   SpO2: 96%  96% 94%     MDM   Meds given in ED:  Medications  cefTRIAXone (ROCEPHIN) 1 g in dextrose 5 % 50 mL IVPB (not administered)  sodium polystyrene (KAYEXALATE) 15 GM/60ML suspension 15 g (not administered)  insulin aspart (novoLOG) injection 5 Units (not administered)  sodium chloride 0.9 % bolus 500 mL (500 mLs Intravenous New Bag/Given 08/22/14 1659)  acetaminophen (TYLENOL) tablet 650 mg (650 mg Oral Given 08/22/14 1659)    New Prescriptions   No medications on file    Final diagnoses:  Pyelonephritis  Hyperkalemia   This  is a 54 y.o. male with a history of renal transplant, diabetes, hypertension and peripheral vascular disease who presents the emergency department complaining of generalized weakness and fatigue for the past 5 days. He also complained of dysuria that has since resolved.  Patient is on Prograf and has not taken his Prograf and 2 days. Patient's abdomen is soft  and nontender to palpation. Patient has right-sided CVA tenderness. Patient has a rectal temperature 100.6. Patient's urinalysis indicates infection. Patient started on Rocephin per pharmacy consult. Patient has a potassium of 6.0. Patient given 15 g of Kayexalate and 5 units insulin with a 500 mL fluid bolus. Patient also given 650 mg Tylenol. Patient's white count is 23.8. Dr. Lowell Guitar is his nephrologist. He believes he has pyelonephritis and possible failing transplant. Consult for admission and patient was accepted for admission by Dr. Randol Kern.   This patient was discussed with and evaluated by Dr. Effie Shy who agrees with assessment and plan.     Lawana Chambers, PA-C 08/22/14 1822  Flint Melter, MD 08/23/14 [phone]

## 2014-08-23 ENCOUNTER — Inpatient Hospital Stay (HOSPITAL_COMMUNITY): Payer: Medicare Other

## 2014-08-23 DIAGNOSIS — N12 Tubulo-interstitial nephritis, not specified as acute or chronic: Secondary | ICD-10-CM | POA: Insufficient documentation

## 2014-08-23 DIAGNOSIS — N179 Acute kidney failure, unspecified: Secondary | ICD-10-CM

## 2014-08-23 DIAGNOSIS — N189 Chronic kidney disease, unspecified: Secondary | ICD-10-CM

## 2014-08-23 LAB — GLUCOSE, CAPILLARY
GLUCOSE-CAPILLARY: 380 mg/dL — AB (ref 70–99)
GLUCOSE-CAPILLARY: 374 mg/dL — AB (ref 70–99)
Glucose-Capillary: 284 mg/dL — ABNORMAL HIGH (ref 70–99)

## 2014-08-23 LAB — CBC
HEMATOCRIT: 33 % — AB (ref 39.0–52.0)
Hemoglobin: 10.8 g/dL — ABNORMAL LOW (ref 13.0–17.0)
MCH: 26.8 pg (ref 26.0–34.0)
MCHC: 32.7 g/dL (ref 30.0–36.0)
MCV: 81.9 fL (ref 78.0–100.0)
Platelets: 134 10*3/uL — ABNORMAL LOW (ref 150–400)
RBC: 4.03 MIL/uL — AB (ref 4.22–5.81)
RDW: 14.1 % (ref 11.5–15.5)
WBC: 23.4 10*3/uL — AB (ref 4.0–10.5)

## 2014-08-23 LAB — BASIC METABOLIC PANEL
Anion gap: 9 (ref 5–15)
BUN: 58 mg/dL — AB (ref 6–23)
CHLORIDE: 101 mmol/L (ref 96–112)
CO2: 20 mmol/L (ref 19–32)
Calcium: 8.5 mg/dL (ref 8.4–10.5)
Creatinine, Ser: 2.78 mg/dL — ABNORMAL HIGH (ref 0.50–1.35)
GFR calc non Af Amer: 24 mL/min — ABNORMAL LOW (ref >90)
GFR, EST AFRICAN AMERICAN: 28 mL/min — AB (ref >90)
GLUCOSE: 335 mg/dL — AB (ref 70–99)
POTASSIUM: 4.7 mmol/L (ref 3.5–5.1)
Sodium: 130 mmol/L — ABNORMAL LOW (ref 135–145)

## 2014-08-23 MED ORDER — PREDNISONE 20 MG PO TABS
40.0000 mg | ORAL_TABLET | Freq: Every day | ORAL | Status: DC
  Administered 2014-08-24: 40 mg via ORAL
  Filled 2014-08-23 (×2): qty 2

## 2014-08-23 NOTE — Progress Notes (Signed)
  Note glucose presently running high in 300's. Med rec states pt takes lantus 20 units only; notes 70/30 20 units bid but pt not presently taking. In addition to lantus 20 units, it may be that the potentially a safe way to lower glucose into lower 200's would be to add some low dose meal coverage given only if pt eats >/= 50% of meal.  Presently po intake is documented at 100%  Thank you Lenor CoffinAnn Jaramie Bastos, RN, MSN, CDE  Diabetes Inpatient Program Office: [phone] Pager: [phone] 8:00 am to 5:00 pm

## 2014-08-23 NOTE — Progress Notes (Signed)
Subjective:  Says he feels a little better this AM- abdominal pain is gone- renal ultrasound negative for fluid collection- he says he is making urine and the burning is less- it has not been recorded.  Labs were not done this AM yet Objective Vital signs in last 24 hours: Filed Vitals:   08/22/14 2015 08/22/14 2033 08/22/14 2119 08/23/14 0407  BP: 125/89 135/83 102/57 140/88  Pulse: 103 98 102 97  Temp:   98.7 F (37.1 C) 98.3 F (36.8 C)  TempSrc:   Oral Oral  Resp: 25 14 16 16   Height:   5\' 11"  (1.803 m)   Weight:   110.2 kg (242 lb 15.2 oz)   SpO2: 97% 96% 96% 97%   Weight change:   Intake/Output Summary (Last 24 hours) at 08/23/14 1128 Last data filed at 08/23/14 0700  Gross per 24 hour  Intake 3556.67 ml  Output      0 ml  Net 3556.67 ml    Assessment/ Plan: Pt is a 54 y.o. yo male with multiple medical issues including s/p DD extended criteria transplant in 2004  who was admitted on 08/22/2014 with  Fever, leukocytosis, A on CRF with evidence of UTI/sepsis Assessment/Plan: 1. Renal- A on CRF in the setting of febrile syndrome UTI/pyelo with possible sepsis.  However, also had missed prograf so rejection is in the differential.  Baseline creatinine is 1.3-1.4.  Labs not resulted yet this AM but clinically patient feels improved on antibiotics so hopefully this acute insult to renal function will be quick to resolve.  Is making urine 2. ID- fever, leukocytosis- with evidence of UTI by U/A- cultures are pending (urine and blood) , he is on empiric maxipime and vanc and seems clinically improved 3. Anemia- not an issue at this time 4. HTN/volume- blood pressure reasonable on no meds for now.  Is normally on lopressor at home.  Will look to resume, maybe tomorrow-  prednisone is not helping 5. Transplant meds- myfortic 720 BID and prograf 4 BID at home.  On prednisone 40 per Dr. Lowell GuitarPowell for possible rejection.  If creatinine down today indicating more of an infectious etiology for  AKI, will decrease prednisone- looking to wean off.    Wyonia Fontanella A    Labs: Basic Metabolic Panel:  Recent Labs Lab 08/22/14 1624 08/22/14 2028  NA 127*  --   K 6.0* 4.5  CL 97  --   CO2 21  --   GLUCOSE 326*  --   BUN 54*  --   CREATININE 2.97*  --   CALCIUM 8.7  --    Liver Function Tests:  Recent Labs Lab 08/22/14 1624  AST 21  ALT 15  ALKPHOS 101  BILITOT 2.1*  PROT 6.4  ALBUMIN 2.1*   No results for input(s): LIPASE, AMYLASE in the last 168 hours. No results for input(s): AMMONIA in the last 168 hours. CBC:  Recent Labs Lab 08/22/14 1624  WBC 23.8*  NEUTROABS 19.3*  HGB 11.9*  HCT 36.1*  MCV 83.8  PLT 140*   Cardiac Enzymes: No results for input(s): CKTOTAL, CKMB, CKMBINDEX, TROPONINI in the last 168 hours. CBG:  Recent Labs Lab 08/22/14 1820 08/22/14 1935 08/22/14 2118 08/23/14 0810  GLUCAP 279* 315* 328* 380*    Iron Studies: No results for input(s): IRON, TIBC, TRANSFERRIN, FERRITIN in the last 72 hours. Studies/Results: Dg Chest 2 View  08/22/2014   CLINICAL DATA:  Fatigue and fever for 4 days.  EXAM: CHEST  2  VIEW  COMPARISON:  Single view of the chest 08/12/2014 and PA and lateral chest 07/22/2013.  FINDINGS: The lungs are clear. Heart size is upper normal. No pneumothorax or pleural effusion. No focal bony abnormality.  IMPRESSION: No acute disease.   Electronically Signed   By: Drusilla Kanner M.D.   On: 08/22/2014 18:27   US Renal Transplant W/doppler  08/23/2014   CLINICAL DATA:  Acute renal failure and renal transplant.  EXAM: ULTRASOUND OF RENAL TRANSPLANT WITH DOPPLER  TECHNIQUE: Ultrasound examination of the renal transplant was performed with gray-scale, color and duplex doppler evaluation.  COMPARISON:  None.  FINDINGS: Transplant kidney location:  Right lower quadrant  Transplant kidney:  Length: 14 cm, of indeterminate significance without comparison. Corticomedullary differentiation is within normal limits for  superficial position. There is no hydronephrosis, urothelial thickening, or fluid collection.  Color flow in the main renal artery:  Present  Color flow in the main renal vein:  Present  Duplex Doppler Evaluation  Resistive index in main renal artery: 0.7. Peak systolic velocity is 113 centimeters/seconds.  Venous waveform in main renal vein:  Normal  Resistive index in upper pole intrarenal artery: 0.63  Resistive index in lower pole intrarenal artery: 0.66  Bladder: Normal for degree of bladder distention.  Other findings: Incidentally visualized numerous gallstones with gallbladder distention.  IMPRESSION: 1. Borderline elevated resistive indices. Without baseline it is difficult to determine relationship to patient's acute renal failure. 2. No negative for renal artery stenosis or renal vein thrombosis. 3. No urinary obstruction or fluid collection. 4. Cholelithiasis.   Electronically Signed   By: Marnee Spring M.D.   On: 08/23/2014 10:18   Medications: Infusions: . sodium chloride 100 mL/hr at 08/22/14 2308    Scheduled Medications: . ceFEPime (MAXIPIME) IV  1 g Intravenous Q24H  . docusate sodium  100 mg Oral BID  . heparin  5,000 Units Subcutaneous 3 times per day  . insulin aspart  0-15 Units Subcutaneous TID WC  . insulin glargine  20 Units Subcutaneous QHS  . mycophenolate  720 mg Oral BID  . [START ON 08/24/2014] predniSONE  40 mg Oral Q breakfast  . tacrolimus  4 mg Oral BID  . [START ON 08/24/2014] vancomycin  1,500 mg Intravenous Q48H    have reviewed scheduled and prn medications.  Physical Exam: General: NAD Heart: RRR Lungs: mostly clear Abdomen: soft, non tender Extremities: no edema    08/23/2014,11:28 AM  LOS: 1 day

## 2014-08-23 NOTE — Progress Notes (Signed)
Received a call from the office that his blood cultures from yesterday showed a preliminary report  positive for gram negative rods, both bottles.  She is on cefepime  Dutchess Crosland A

## 2014-08-23 NOTE — Progress Notes (Signed)
PROGRESS NOTE  Trevor Thompson WGN:[id number] DOB: 07-02-1961 DOA: 08/22/2014 PCP: Jeanann Lewandowsky, MD  Brief history 54 year old male with a history of hypertension, diabetes mellitus, peripheral vascular disease, renal transplant presents with 5 day history of abdominal pain and fevers at home. The patient went to see his nephrologist, Dr. Lowell Guitar, on 08/21/2014. The patient had blood work done including blood cultures during that office visit. He was noted to have WBC 26.9 with a left shift, serum creatinine 2.26. He continued to feel poorly. As a result, he was obstructed to come to the emergency department for further evaluation. Because the patient has been feeling poorly, he has not taken his immunosuppressive medications for 2 days prior to admission. The patient was noted to have WBC 23.8 with tachycardia and fever in the emergency department. Urinalysis showed significant pyuria. The patient was admitted for sepsis. He was started on empiric vancomycin and cefepime. Assessment/Plan: Sepsis -Present at the time of admission -Likely secondary to UTI/pyelonephritis of his transplant kidney -Continue empiric vancomycin and cefepime pending culture data -IV fluids -Stress steroids--decreased to once daily UTI/pyelonephritis -Ultrasound of transplanted kidney -Continue antibiotics as discussed  -follow blood and urine culture data Acute on chronic renal failure (CKD2) -Dr. Roanna Banning note reveals that the patient had a serum creatinine of 1.38 on 06/04/2014  -This is likely due to the patient's sepsis, hypovolemia although transplant rejection cannot be ruled out  hyperkalemia  -Improved  -He was treated with Kayexalate 08/22/2014  Diabetes mellitus type 2  -Patient states that he is presently taking Lantus 20 units twice a day at home  -Restart half dose Lantus  -NovoLog sliding scale  -Hemoglobin A1c Hypertension  -Hold metoprolol as the patient's blood pressure was soft     Family Communication:   Pt at beside Disposition Plan:   Home when medically stable       Procedures/Studies: Dg Chest 2 View  08/22/2014   CLINICAL DATA:  Fatigue and fever for 4 days.  EXAM: CHEST  2 VIEW  COMPARISON:  Single view of the chest 08/12/2014 and PA and lateral chest 07/22/2013.  FINDINGS: The lungs are clear. Heart size is upper normal. No pneumothorax or pleural effusion. No focal bony abnormality.  IMPRESSION: No acute disease.   Electronically Signed   By: Drusilla Kanner M.D.   On: 08/22/2014 18:27   Dg Chest Port 1 View  08/12/2014   CLINICAL DATA:  Sudden onset left upper chest pain and low blood pressure this p.m.  EXAM: PORTABLE CHEST - 1 VIEW  COMPARISON:  07/28/2013  FINDINGS: Shallow inspiration with linear fibrosis or atelectasis in the right mid lung. Heart size and pulmonary vascularity are normal for technique. No focal airspace disease. No blunting of costophrenic angles. No pneumothorax. Calcification of the aorta.  IMPRESSION: No active disease.   Electronically Signed   By: Burman Nieves M.D.   On: 08/12/2014 22:22         Subjective: Patient states that he is feeling better. He does not have fevers or chills this morning. Abdominal pain is much improved. Patient denies fevers, chills, headache, chest pain, dyspnea, nausea, vomiting, diarrhea,  dysuria, hematuria   Objective: Filed Vitals:   08/22/14 2015 08/22/14 2033 08/22/14 2119 08/23/14 0407  BP: 125/89 135/83 102/57 140/88  Pulse: 103 98 102 97  Temp:   98.7 F (37.1 C) 98.3 F (36.8 C)  TempSrc:   Oral Oral  Resp: 16  Height:   5\' 11"  (1.803 m)   Weight:   110.2 kg (242 lb 15.2 oz)   SpO2: 97% 96% 96% 97%    Intake/Output Summary (Last 24 hours) at 08/23/14 0900 Last data filed at 08/23/14 0700  Gross per 24 hour  Intake 3556.67 ml  Output      0 ml  Net 3556.67 ml   Weight change:  Exam:   General:  Pt is alert, follows commands appropriately, not in acute  distress  HEENT: No icterus, No thrush,  [place]/AT  Cardiovascular: RRR, S1/S2, no rubs, no gallops  Respiratory: CTA bilaterally, no wheezing, no crackles, no rhonchi  Abdomen: Soft/+BS, non tender, non distended, no guarding  Extremities: No edema, No lymphangitis, No petechiae, No rashes, no synovitis; 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 No results for input(s): AMMONIA in the last 168 hours. CBC:  Recent Labs Lab 08/22/14 1624  WBC 23.8*  NEUTROABS 19.3*  HGB 11.9*  HCT 36.1*  MCV 83.8  PLT 140*   Cardiac Enzymes: No results for input(s): CKTOTAL, CKMB, CKMBINDEX, TROPONINI in the last 168 hours. BNP: Invalid input(s): POCBNP CBG:  Recent Labs Lab 08/22/14 1820 08/22/14 1935 08/22/14 2118  GLUCAP 279* 315* 328*    No results found for this or any previous visit (from the past 240 hour(s)).   Scheduled Meds: . ceFEPime (MAXIPIME) IV  1 g Intravenous Q24H  . docusate sodium  100 mg Oral BID  . heparin  5,000 Units Subcutaneous 3 times per day  . hydrocortisone sod succinate (SOLU-CORTEF) inj  50 mg Intravenous Q8H  . insulin aspart  0-15 Units Subcutaneous TID WC  . insulin glargine  20 Units Subcutaneous QHS  . mycophenolate  720 mg Oral BID  . tacrolimus  4 mg Oral BID  . [START ON 08/24/2014] vancomycin  1,500 mg Intravenous Q48H   Continuous Infusions: . sodium chloride 100 mL/hr at 08/22/14 2308     Jessica Seidman, DO  [facility] Pager [phone]  If 7PM-7AM, please contact night-coverage [email or web address] Password  [email or web address] 08/23/2014, 9:00 AM   LOS: 1 day

## 2014-08-24 DIAGNOSIS — A4151 Sepsis due to Escherichia coli [E. coli]: Principal | ICD-10-CM

## 2014-08-24 DIAGNOSIS — E875 Hyperkalemia: Secondary | ICD-10-CM

## 2014-08-24 LAB — RENAL FUNCTION PANEL
Albumin: 2 g/dL — ABNORMAL LOW (ref 3.5–5.2)
Anion gap: 11 (ref 5–15)
BUN: 52 mg/dL — ABNORMAL HIGH (ref 6–23)
CALCIUM: 8.2 mg/dL — AB (ref 8.4–10.5)
CO2: 17 mmol/L — AB (ref 19–32)
Chloride: 102 mmol/L (ref 96–112)
Creatinine, Ser: 2.37 mg/dL — ABNORMAL HIGH (ref 0.50–1.35)
GFR calc Af Amer: 34 mL/min — ABNORMAL LOW (ref >90)
GFR calc non Af Amer: 29 mL/min — ABNORMAL LOW (ref >90)
GLUCOSE: 455 mg/dL — AB (ref 70–99)
PHOSPHORUS: 2.4 mg/dL (ref 2.3–4.6)
POTASSIUM: 4.3 mmol/L (ref 3.5–5.1)
Sodium: 130 mmol/L — ABNORMAL LOW (ref 135–145)

## 2014-08-24 LAB — GLUCOSE, CAPILLARY
GLUCOSE-CAPILLARY: 289 mg/dL — AB (ref 70–99)
GLUCOSE-CAPILLARY: 435 mg/dL — AB (ref 70–99)
Glucose-Capillary: 318 mg/dL — ABNORMAL HIGH (ref 70–99)
Glucose-Capillary: 489 mg/dL — ABNORMAL HIGH (ref 70–99)

## 2014-08-24 LAB — URINE CULTURE: Colony Count: >=100000

## 2014-08-24 LAB — TROPONIN I
TROPONIN I: 0.03 ng/mL (ref <0.031)
Troponin I: 0.11 ng/mL — ABNORMAL HIGH (ref <0.031)
Troponin I: 0.03 ng/mL (ref <0.031)

## 2014-08-24 MED ORDER — MORPHINE SULFATE 2 MG/ML IJ SOLN: 2.0000 mg | Freq: Once | INTRAMUSCULAR | Status: DC

## 2014-08-24 MED ORDER — INSULIN ASPART 100 UNIT/ML ~~LOC~~ SOLN
25.0000 [IU] | Freq: Once | SUBCUTANEOUS | Status: AC
  Administered 2014-08-24: 25 [IU] via SUBCUTANEOUS

## 2014-08-24 MED ORDER — INSULIN ASPART 100 UNIT/ML ~~LOC~~ SOLN
5.0000 [IU] | Freq: Three times a day (TID) | SUBCUTANEOUS | Status: DC
  Administered 2014-08-24 – 2014-08-25 (×3): 5 [IU] via SUBCUTANEOUS

## 2014-08-24 MED ORDER — INSULIN ASPART 100 UNIT/ML ~~LOC~~ SOLN
15.0000 [IU] | Freq: Once | SUBCUTANEOUS | Status: AC
  Administered 2014-08-24: 15 [IU] via SUBCUTANEOUS

## 2014-08-24 MED ORDER — PREDNISONE 20 MG PO TABS
30.0000 mg | ORAL_TABLET | Freq: Every day | ORAL | Status: DC
  Administered 2014-08-25: 30 mg via ORAL
  Filled 2014-08-24 (×2): qty 1

## 2014-08-24 MED ORDER — PANTOPRAZOLE SODIUM 40 MG PO TBEC
40.0000 mg | DELAYED_RELEASE_TABLET | Freq: Every day | ORAL | Status: DC
  Administered 2014-08-24 – 2014-08-26 (×3): 40 mg via ORAL
  Filled 2014-08-24 (×3): qty 1

## 2014-08-24 MED ORDER — MORPHINE SULFATE 2 MG/ML IJ SOLN
INTRAMUSCULAR | Status: DC
Start: 2014-08-24 — End: 2014-08-24
  Filled 2014-08-24: qty 1

## 2014-08-24 MED ORDER — ASPIRIN 325 MG PO TABS
325.0000 mg | ORAL_TABLET | Freq: Once | ORAL | Status: AC
  Administered 2014-08-24: 325 mg via ORAL
  Filled 2014-08-24: qty 1

## 2014-08-24 MED ORDER — INSULIN GLARGINE 100 UNIT/ML ~~LOC~~ SOLN
30.0000 [IU] | Freq: Every day | SUBCUTANEOUS | Status: DC
  Administered 2014-08-24: 30 [IU] via SUBCUTANEOUS
  Filled 2014-08-24 (×2): qty 0.3

## 2014-08-24 MED ORDER — WHITE PETROLATUM GEL
Status: AC
  Administered 2014-08-24: 05:00:00
  Filled 2014-08-24: qty 1

## 2014-08-24 MED ORDER — NITROGLYCERIN 0.4 MG SL SUBL
SUBLINGUAL_TABLET | SUBLINGUAL | Status: AC
  Administered 2014-08-24: 06:00:00
  Filled 2014-08-24: qty 1

## 2014-08-24 NOTE — Progress Notes (Signed)
PROGRESS NOTE  Trevor Thompson WUJ:[id number] DOB: 02/10/1961 DOA: 08/22/2014 PCP: Jeanann LewandowskyJEGEDE, OLUGBEMIGA, MD   Brief history 54 year old male with a history of hypertension, diabetes mellitus, peripheral vascular disease, renal transplant presents with 5 day history of abdominal pain and fevers at home. The patient went to see his nephrologist, Dr. Lowell GuitarPowell, on 08/21/2014. The patient had blood work done including blood cultures during that office visit. He was noted to have WBC 26.9 with a left shift, serum creatinine 2.26. He continued to feel poorly. As a result, he was obstructed to come to the emergency department for further evaluation. Because the patient has been feeling poorly, he has not taken his immunosuppressive medications for 2 days prior to admission. The patient was noted to have WBC 23.8 with tachycardia and fever in the emergency department. Urinalysis showed significant pyuria. The patient was admitted for sepsis. He was started on empiric vancomycin and cefepime.  Vancomycin was discontinued when cultures came back GNR. Assessment/Plan: Sepsis -Present at the time of admission -Likely secondary to UTI/pyelonephritis of his transplant kidney and Gram  Negative Bacteremia -Continue empiric vancomycin and cefepime pending culture data -IV fluids -Stress steroids--continue to wean Gram negative Bacteremia -source is urine -continue cefepime pending culture data UTI/pyelonephritis -Ultrasound of transplanted kidney--Borderline elevated resistive indices-- no fluid collection or hydronephrosis -Continue antibiotics as discussed  -follow blood and urine culture data Acute on chronic renal failure (CKD2) -Dr. Roanna BanningPowell's note reveals that the patient had a serum creatinine of 1.38 on 06/04/2014  -This is likely due to the patient's sepsis, hypovolemia although transplant rejection remains in differential -improving with IVF and abx Atypical chest pain/elevated troponin -Likely  demand ischemia in the setting of acute on chronic renal failure -Doubt ACS hyperkalemia  -Improved  -He was treated with Kayexalate 08/22/2014  Diabetes mellitus type 2  -Patient states that he is presently taking Lantus 20 units twice a day at home  -increase Lantus to 30 units qhs -novolog 5 units premeal -NovoLog sliding scale  -Hemoglobin A1c Hypertension  -Hold metoprolol as the patient's blood pressure was soft  -BP slowly increasing, may need to restart anti-HTN meds   Family Communication: Pt at beside Disposition Plan: Home when medically stable   Procedures/Studies: Dg Chest 2 View  08/22/2014   CLINICAL DATA:  Fatigue and fever for 4 days.  EXAM: CHEST  2 VIEW  COMPARISON:  Single view of the chest 08/12/2014 and PA and lateral chest 07/22/2013.  FINDINGS: The lungs are clear. Heart size is upper normal. No pneumothorax or pleural effusion. No focal bony abnormality.  IMPRESSION: No acute disease.   Electronically Signed   By: Drusilla Kannerhomas  Dalessio M.D.   On: 08/22/2014 18:27   [place] Renal Transplant W/doppler  08/23/2014   CLINICAL DATA:  Acute renal failure and renal transplant.  EXAM: ULTRASOUND OF RENAL TRANSPLANT WITH DOPPLER  TECHNIQUE: Ultrasound examination of the renal transplant was performed with gray-scale, color and duplex doppler evaluation.  COMPARISON:  None.  FINDINGS: Transplant kidney location:  Right lower quadrant  Transplant kidney:  Length: 14 cm, of indeterminate significance without comparison. Corticomedullary differentiation is within normal limits for superficial position. There is no hydronephrosis, urothelial thickening, or fluid collection.  Color flow in the main renal artery:  Present  Color flow in the main renal vein:  Present  Duplex Doppler Evaluation  Resistive index in main renal artery: 0.7. Peak systolic velocity is 113 centimeters/seconds.  Venous waveform in main renal  vein:  Normal  Resistive index in upper pole intrarenal artery: 0.63   Resistive index in lower pole intrarenal artery: 0.66  Bladder: Normal for degree of bladder distention.  Other findings: Incidentally visualized numerous gallstones with gallbladder distention.  IMPRESSION: 1. Borderline elevated resistive indices. Without baseline it is difficult to determine relationship to patient's acute renal failure. 2. No negative for renal artery stenosis or renal vein thrombosis. 3. No urinary obstruction or fluid collection. 4. Cholelithiasis.   Electronically Signed   By: Marnee Spring M.D.   On: 08/23/2014 10:18   Dg Chest Port 1 View  08/12/2014   CLINICAL DATA:  Sudden onset left upper chest pain and low blood pressure this p.m.  EXAM: PORTABLE CHEST - 1 VIEW  COMPARISON:  07/28/2013  FINDINGS: Shallow inspiration with linear fibrosis or atelectasis in the right mid lung. Heart size and pulmonary vascularity are normal for technique. No focal airspace disease. No blunting of costophrenic angles. No pneumothorax. Calcification of the aorta.  IMPRESSION: No active disease.   Electronically Signed   By: Burman Nieves M.D.   On: 08/12/2014 22:22         Subjective: patient had some chest discomfort last night. It has resolved. He denies any sort shortness of breath, nausea, vomiting, diarrhea, abdominal pain, dysuria.   Objective: Filed Vitals:   08/24/14 0547 08/24/14 0558 08/24/14 0616 08/24/14 1000  BP: 93/52 105/58 118/81 144/76  Pulse: 93 92 95 87  Temp:    98.7 F (37.1 C)  TempSrc:    Oral  Resp: Height:      Weight:      SpO2: 94% 95%  96%    Intake/Output Summary (Last 24 hours) at 08/24/14 1714 Last data filed at 08/24/14 1300  Gross per 24 hour  Intake    840 ml  Output    800 ml  Net     40 ml   Weight change: 4.106 kg (9 lb 0.9 oz) Exam:   General:  Pt is alert, follows commands appropriately, not in acute distress  HEENT: No icterus, No thrush,  [place]/AT  Cardiovascular: RRR, S1/S2, no rubs, no gallops  Respiratory:  CTA bilaterally, no wheezing, no crackles, no rhonchi  Abdomen: Soft/+BS, non tender, non distended, no guarding  Extremities: No edema, No lymphangitis, No petechiae, No rashes, no synovitis; L-BKA without open wounds, erythema  Data Reviewed: Basic Metabolic Panel:  Recent Labs Lab 08/22/14 1624 08/22/14 2028 08/23/14 1148 08/24/14 1410  NA 127*  --  130* 130*  K 6.0* 4.5 4.7 4.3  CL 97  --  101 102  CO2 21  --  20 17*  GLUCOSE 326*  --  335* 455*  BUN 54*  --  58* 52*  CREATININE 2.97*  --  2.78* 2.37*  CALCIUM 8.7  --  8.5 8.2*  PHOS  --   --   --  2.4   Liver Function Tests:  Recent Labs Lab 08/22/14 1624 08/24/14 1410  AST 21  --   ALT 15  --   ALKPHOS 101  --   BILITOT 2.1*  --   PROT 6.4  --   ALBUMIN 2.1* 2.0*   No results for input(s): LIPASE, AMYLASE in the last 168 hours. No results for input(s): AMMONIA in the last 168 hours. CBC:  Recent Labs Lab 08/22/14 1624 08/23/14 1148  WBC 23.8* 23.4*  NEUTROABS 19.3*  --   HGB 11.9* 10.8*  HCT 36.1*  33.0*  MCV 83.8 81.9  PLT 140* 134*   Cardiac Enzymes:  Recent Labs Lab 08/24/14 0643 08/24/14 1410  TROPONINI 0.03 0.11*   BNP: Invalid input(s): POCBNP CBG:  Recent Labs Lab 08/23/14 1204 08/23/14 1641 08/24/14 0806 08/24/14 1153 08/24/14 1644  GLUCAP 284* 374* 289* 318* 489*    Recent Results (from the past 240 hour(s))  Urine culture     Status: None (Preliminary result)   Collection Time: 08/22/14  4:58 PM  Result Value Ref Range Status   Specimen Description URINE, CLEAN CATCH  Final   Special Requests Immunocompromised  Final   Colony Count   Final    >=100,000 COLONIES/ML Performed at [facility]    Culture   Final    ESCHERICHIA COLI Performed at [facility]    Report Status PENDING  Incomplete  Culture, blood (routine x 2)     Status: None (Preliminary result)   Collection Time: 08/22/14  6:33 PM  Result Value Ref Range Status   Specimen  Description BLOOD HAND LEFT  Final   Special Requests BOTTLES DRAWN AEROBIC AND ANAEROBIC 5CC  Final   Culture   Final    GRAM NEGATIVE RODS Note: Gram Stain Report Called to,Read Back By and Verified With: REBECCA RN ON 6E AT 1104 [id number] BY CASTC Performed at [facility]    Report Status PENDING  Incomplete  Culture, blood (routine x 2)     Status: None (Preliminary result)   Collection Time: 08/22/14  6:40 PM  Result Value Ref Range Status   Specimen Description BLOOD FOREARM LEFT  Final   Special Requests BOTTLES DRAWN AEROBIC AND ANAEROBIC 5CC  Final   Culture   Final           BLOOD CULTURE RECEIVED NO GROWTH TO DATE CULTURE WILL BE HELD FOR 5 DAYS BEFORE ISSUING A FINAL NEGATIVE REPORT Performed at [facility]    Report Status PENDING  Incomplete     Scheduled Meds: . ceFEPime (MAXIPIME) IV  1 g Intravenous Q24H  . docusate sodium  100 mg Oral BID  . heparin  5,000 Units Subcutaneous 3 times per day  . insulin aspart  0-15 Units Subcutaneous TID WC  . insulin aspart  25 Units Subcutaneous Once  . insulin aspart  5 Units Subcutaneous TID WC  . insulin glargine  30 Units Subcutaneous QHS  .  morphine injection  2 mg Intravenous Once  . mycophenolate  720 mg Oral BID  . pantoprazole  40 mg Oral Daily  . [START ON 08/25/2014] predniSONE  30 mg Oral Q breakfast  . tacrolimus  4 mg Oral BID   Continuous Infusions: . sodium chloride 100 mL/hr at 08/24/14 0834     Hutch Rhett, DO  [facility] Pager [phone]  If 7PM-7AM, please contact night-coverage [email or web address] Password [email or web address] 08/24/2014, 5:14 PM   LOS: 2 days

## 2014-08-24 NOTE — Significant Event (Signed)
Rapid Response Event Note Called per floor RN for pt complaining of 7/10 chest pain. EKG done while en route to room Overview: Time Called: 0520 Arrival Time: 0524 Event Type: Cardiac  Initial Focused Assessment: Pt found resting in bed. Alert oriented X 4. Complains of constant pain in middle of chest that radiates to both side. Denies SOB or pain elsewhere. Pain is not worse with touch. ABD soft and non tender.  Interventions: EKG completed,no acute change seen from prior EKG. MD to read once available in EPIC. 1 NTG tab given SL. J.Gardner MD paged and updated per floor RN. Morphine ordered stat. After checking BP , will hold Morphine for now. Pt chest pain resolving, complains of mild light headedness. 2 LNC placed for desats to upper 80s. Troponin labs ordered as well as aspirin tablet.  RN to monitor closely. Pt left resting quietly in bed 2/10 pain resolving.   Event Summary: J.Gardner TRiad MD paged at 0527    at    Outcome: Stayed in room and stabalized     White, James IvanoffBrooke Leigh Breezie Micucci

## 2014-08-24 NOTE — Progress Notes (Signed)
Subjective:  Had chest pain last night EKG unchanged- troponin seeming to be negative- ? Reflux? Making urine- no labs this AM.  Urine culture e coli- blood culture GNR- sens pending  Objective Vital signs in last 24 hours: Filed Vitals:   08/24/14 0547 08/24/14 0558 08/24/14 0616 08/24/14 1000  BP: 93/52 105/58 118/81 144/76  Pulse: 93 92 95 87  Temp:    98.7 F (37.1 C)  TempSrc:    Oral  Resp: 12 15 14 16   Height:      Weight:      SpO2: 94% 95%  96%   Weight change: 4.106 kg (9 lb 0.9 oz)  Intake/Output Summary (Last 24 hours) at 08/24/14 1230 Last data filed at 08/24/14 0900  Gross per 24 hour  Intake    720 ml  Output    800 ml  Net    -80 ml    Assessment/ Plan: Pt is a 54 y.o. yo male with multiple medical issues including s/p DD extended criteria transplant in 2004  who was admitted on 08/22/2014 with  Fever, leukocytosis, A on CRF with evidence of UTI/sepsis Assessment/Plan: 1. Renal- A on CRF in the setting of febrile syndrome UTI/pyelo with sepsis.  However, also had missed prograf so rejection is in the differential.  Baseline creatinine is 1.3-1.4.  Labs not resulted yet this AM but clinically patient feels improved on antibiotics and creatinine down slightly yesterday- so hopefully this acute insult to renal function will be quick to resolve.  Is making urine 2. ID- fever, leukocytosis- with evidence of UTI by U/A- urine culture e coli and blood culture gram negative rods, he is on empiric maxipime and vanc and seems clinically improved- will stop vanc and await sensitivities 3. Anemia- not an issue at this time 4. HTN/volume- blood pressure reasonable on no meds for now.  Is normally on lopressor at home.  Will look to resume, maybe tomorrow-  prednisone is not helping 5. Transplant meds- myfortic 720 BID and prograf 4 BID at home.  On prednisone 40 per Dr. Lowell GuitarPowell for possible rejection.  If creatinine down more today indicating more of an infectious etiology for AKI,  will decrease prednisone- looking to wean off eventually.   6. Chest pain- seems more consistent with GERD with prednisone- start protonix  Mylah Baynes A    Labs: Basic Metabolic Panel:  Recent Labs Lab 08/22/14 1624 08/22/14 2028 08/23/14 1148  NA 127*  --  130*  K 6.0* 4.5 4.7  CL 97  --  101  CO2 21  --  20  GLUCOSE 326*  --  335*  BUN 54*  --  58*  CREATININE 2.97*  --  2.78*  CALCIUM 8.7  --  8.5   Liver Function Tests:  Recent Labs Lab 08/22/14 1624  AST 21  ALT 15  ALKPHOS 101  BILITOT 2.1*  PROT 6.4  ALBUMIN 2.1*   No results for input(s): LIPASE, AMYLASE in the last 168 hours. No results for input(s): AMMONIA in the last 168 hours. CBC:  Recent Labs Lab 08/22/14 1624 08/23/14 1148  WBC 23.8* 23.4*  NEUTROABS 19.3*  --   HGB 11.9* 10.8*  HCT 36.1* 33.0*  MCV 83.8 81.9  PLT 140* 134*   Cardiac Enzymes:  Recent Labs Lab 08/24/14 0643  TROPONINI 0.03   CBG:  Recent Labs Lab 08/23/14 0810 08/23/14 1204 08/23/14 1641 08/24/14 0806 08/24/14 1153  GLUCAP 380* 284* 374* 289* 318*    Iron Studies: No  results for input(s): IRON, TIBC, TRANSFERRIN, FERRITIN in the last 72 hours. Studies/Results: Dg Chest 2 View  08/22/2014   CLINICAL DATA:  Fatigue and fever for 4 days.  EXAM: CHEST  2 VIEW  COMPARISON:  Single view of the chest 08/12/2014 and PA and lateral chest 07/22/2013.  FINDINGS: The lungs are clear. Heart size is upper normal. No pneumothorax or pleural effusion. No focal bony abnormality.  IMPRESSION: No acute disease.   Electronically Signed   By: Drusilla Kanner M.D.   On: 08/22/2014 18:27   US Renal Transplant W/doppler  08/23/2014   CLINICAL DATA:  Acute renal failure and renal transplant.  EXAM: ULTRASOUND OF RENAL TRANSPLANT WITH DOPPLER  TECHNIQUE: Ultrasound examination of the renal transplant was performed with gray-scale, color and duplex doppler evaluation.  COMPARISON:  None.  FINDINGS: Transplant kidney location:   Right lower quadrant  Transplant kidney:  Length: 14 cm, of indeterminate significance without comparison. Corticomedullary differentiation is within normal limits for superficial position. There is no hydronephrosis, urothelial thickening, or fluid collection.  Color flow in the main renal artery:  Present  Color flow in the main renal vein:  Present  Duplex Doppler Evaluation  Resistive index in main renal artery: 0.7. Peak systolic velocity is 113 centimeters/seconds.  Venous waveform in main renal vein:  Normal  Resistive index in upper pole intrarenal artery: 0.63  Resistive index in lower pole intrarenal artery: 0.66  Bladder: Normal for degree of bladder distention.  Other findings: Incidentally visualized numerous gallstones with gallbladder distention.  IMPRESSION: 1. Borderline elevated resistive indices. Without baseline it is difficult to determine relationship to patient's acute renal failure. 2. No negative for renal artery stenosis or renal vein thrombosis. 3. No urinary obstruction or fluid collection. 4. Cholelithiasis.   Electronically Signed   By: Marnee Spring M.D.   On: 08/23/2014 10:18   Medications: Infusions: . sodium chloride 100 mL/hr at 08/24/14 1610    Scheduled Medications: . ceFEPime (MAXIPIME) IV  1 g Intravenous Q24H  . docusate sodium  100 mg Oral BID  . heparin  5,000 Units Subcutaneous 3 times per day  . insulin aspart  0-15 Units Subcutaneous TID WC  . insulin glargine  20 Units Subcutaneous QHS  .  morphine injection  2 mg Intravenous Once  . mycophenolate  720 mg Oral BID  . predniSONE  40 mg Oral Q breakfast  . tacrolimus  4 mg Oral BID  . vancomycin  1,500 mg Intravenous Q48H    have reviewed scheduled and prn medications.  Physical Exam: General: NAD Heart: RRR Lungs: mostly clear Abdomen: soft, non tender Extremities: no edema    08/24/2014,12:30 PM  LOS: 2 days

## 2014-08-24 NOTE — Progress Notes (Signed)
CRITICAL VALUE ALERT  Critical value received:  Positive blood cx gram negative rods  Date of notification:  08/24/14  Time of notification:  11:06  Critical value read back: yes  Nurse who received alert:  Sharen Heckebecca Sparks  MD notified (1st page):  Dr. Arbutus Leasat  Time of first page:  11:06  MD notified (2nd page):  Time of second page:  Responding MD:   Time MD responded:

## 2014-08-24 NOTE — Significant Event (Addendum)
Patient having chest pain 7/10 in intensity: EKG performed and unchanged per rapid response, troponin is ordered and pending, trying NTG, trying 2mg  morphine to see if this helps.  ASA 325 ordered.  Patient not SOB, vitals remain stable.  Single NTG given but BP dropped to 102/60.  Troponin pending, certainly is high risk for CAD given known vascular disease.  But more suspicious of demand ischemia / Type 2 MI in the setting of known sepsis from E.Coli UTI (and reports of positive GNR on BC in office as well).  Update: patient is actually now feeling better, pain down to a 5 after 1 NTG, no morphine given, but BP is 93/52.  RN monitoring BP closely, if it gets much lower she will call me back.  Not giving any more NTG or morphine right now due to BP.

## 2014-08-25 LAB — CBC
HCT: 36 % — ABNORMAL LOW (ref 39.0–52.0)
Hemoglobin: 11.8 g/dL — ABNORMAL LOW (ref 13.0–17.0)
MCH: 27.3 pg (ref 26.0–34.0)
MCHC: 32.8 g/dL (ref 30.0–36.0)
MCV: 83.3 fL (ref 78.0–100.0)
Platelets: 194 10*3/uL (ref 150–400)
RBC: 4.32 MIL/uL (ref 4.22–5.81)
RDW: 14.3 % (ref 11.5–15.5)
WBC: 16.2 10*3/uL — ABNORMAL HIGH (ref 4.0–10.5)

## 2014-08-25 LAB — RENAL FUNCTION PANEL
ALBUMIN: 2.1 g/dL — AB (ref 3.5–5.2)
ANION GAP: 4 — AB (ref 5–15)
BUN: 47 mg/dL — ABNORMAL HIGH (ref 6–23)
CO2: 22 mmol/L (ref 19–32)
CREATININE: 1.88 mg/dL — AB (ref 0.50–1.35)
Calcium: 8.8 mg/dL (ref 8.4–10.5)
Chloride: 109 mmol/L (ref 96–112)
GFR calc Af Amer: 45 mL/min — ABNORMAL LOW (ref >90)
GFR, EST NON AFRICAN AMERICAN: 39 mL/min — AB (ref >90)
Glucose, Bld: 249 mg/dL — ABNORMAL HIGH (ref 70–99)
Phosphorus: 2.6 mg/dL (ref 2.3–4.6)
Potassium: 4.3 mmol/L (ref 3.5–5.1)
SODIUM: 135 mmol/L (ref 135–145)

## 2014-08-25 LAB — GLUCOSE, CAPILLARY
GLUCOSE-CAPILLARY: 245 mg/dL — AB (ref 70–99)
Glucose-Capillary: 397 mg/dL — ABNORMAL HIGH (ref 70–99)
Glucose-Capillary: 307 mg/dL — ABNORMAL HIGH (ref 70–99)
Glucose-Capillary: 305 mg/dL — ABNORMAL HIGH (ref 70–99)

## 2014-08-25 MED ORDER — METOPROLOL TARTRATE 25 MG PO TABS
25.0000 mg | ORAL_TABLET | Freq: Two times a day (BID) | ORAL | Status: DC
  Administered 2014-08-25 – 2014-08-27 (×5): 25 mg via ORAL
  Filled 2014-08-25 (×7): qty 1

## 2014-08-25 MED ORDER — INSULIN GLARGINE 100 UNIT/ML ~~LOC~~ SOLN
40.0000 [IU] | Freq: Every day | SUBCUTANEOUS | Status: DC
  Administered 2014-08-25: 40 [IU] via SUBCUTANEOUS
  Filled 2014-08-25 (×2): qty 0.4

## 2014-08-25 MED ORDER — INSULIN ASPART 100 UNIT/ML ~~LOC~~ SOLN
0.0000 [IU] | Freq: Three times a day (TID) | SUBCUTANEOUS | Status: DC
  Administered 2014-08-25: 15 [IU] via SUBCUTANEOUS
  Administered 2014-08-26: 7 [IU] via SUBCUTANEOUS
  Administered 2014-08-26: 11 [IU] via SUBCUTANEOUS
  Administered 2014-08-26: 4 [IU] via SUBCUTANEOUS
  Administered 2014-08-27: 11 [IU] via SUBCUTANEOUS
  Administered 2014-08-27: 7 [IU] via SUBCUTANEOUS

## 2014-08-25 MED ORDER — DEXTROSE 5 % IV SOLN
1.0000 g | Freq: Two times a day (BID) | INTRAVENOUS | Status: DC
  Administered 2014-08-25 – 2014-08-27 (×5): 1 g via INTRAVENOUS
  Filled 2014-08-25 (×6): qty 1

## 2014-08-25 MED ORDER — PREDNISONE 20 MG PO TABS
20.0000 mg | ORAL_TABLET | Freq: Every day | ORAL | Status: DC
  Administered 2014-08-26: 20 mg via ORAL
  Filled 2014-08-25 (×2): qty 1

## 2014-08-25 MED ORDER — INSULIN ASPART 100 UNIT/ML ~~LOC~~ SOLN
0.0000 [IU] | Freq: Every day | SUBCUTANEOUS | Status: DC
  Administered 2014-08-25: 4 [IU] via SUBCUTANEOUS
  Administered 2014-08-26: 3 [IU] via SUBCUTANEOUS

## 2014-08-25 MED ORDER — INSULIN ASPART 100 UNIT/ML ~~LOC~~ SOLN
8.0000 [IU] | Freq: Three times a day (TID) | SUBCUTANEOUS | Status: DC
  Administered 2014-08-25 – 2014-08-27 (×6): 8 [IU] via SUBCUTANEOUS

## 2014-08-25 NOTE — Progress Notes (Signed)
ANTIBIOTIC CONSULT NOTE  Pharmacy Consult for cefepime Indication: GNR bacteremia/Ecoli UTI  Allergies  Allergen Reactions  . Penicillins Other (See Comments)    Since birth    Patient Measurements: Height:  (180.3 cm) Weight: 255 lb (115.667 kg) IBW/kg (Calculated) : 75.3   Vital Signs: Temp: 97.8 F (36.6 C) (02/21 0442) BP: 187/104 mmHg (02/21 0442) Pulse Rate: 88 (02/21 0442) Intake/Output from previous day: 02/20 0701 - 02/21 0700 In: 480 [P.O.:480] Out: 500 [Urine:500] Intake/Output from this shift:    Labs:  Recent Labs  08/22/14 1624 08/23/14 1148 08/24/14 1410 08/25/14 0610  WBC 23.8* 23.4*  --  16.2*  HGB 11.9* 10.8*  --  11.8*  PLT 140* 134*  --  194  CREATININE 2.97* 2.78* 2.37* 1.88*   Estimated Creatinine Clearance: 58.1 mL/min (by C-G formula based on Cr of 1.88). No results for input(s): VANCOTROUGH, VANCOPEAK, VANCORANDOM, GENTTROUGH, GENTPEAK, GENTRANDOM, TOBRATROUGH, TOBRAPEAK, TOBRARND, AMIKACINPEAK, AMIKACINTROU, AMIKACIN in the last 72 hours.   Microbiology: Recent Results (from the past 720 hour(s))  Urine culture     Status: None   Collection Time: 08/22/14  4:58 PM  Result Value Ref Range Status   Specimen Description URINE, CLEAN CATCH  Final   Special Requests Immunocompromised  Final   Colony Count   Final    >=100,000 COLONIES/ML Performed at [facility]    Culture   Final    ESCHERICHIA COLI Performed at [facility]    Report Status 08/24/2014 FINAL  Final   Organism ID, Bacteria ESCHERICHIA COLI  Final      Susceptibility   Escherichia coli - MIC*    AMPICILLIN RESISTANT      CEFAZOLIN <=4 SENSITIVE Sensitive     CEFTRIAXONE <=1 SENSITIVE Sensitive     CIPROFLOXACIN <=0.25 SENSITIVE Sensitive     GENTAMICIN <=1 SENSITIVE Sensitive     LEVOFLOXACIN <=0.12 SENSITIVE Sensitive     NITROFURANTOIN <=16 SENSITIVE Sensitive     TOBRAMYCIN <=1 SENSITIVE Sensitive     TRIMETH/SULFA <=20 SENSITIVE  Sensitive     PIP/TAZO <=4 SENSITIVE Sensitive     * ESCHERICHIA COLI  Culture, blood (routine x 2)     Status: None (Preliminary result)   Collection Time: 08/22/14  6:33 PM  Result Value Ref Range Status   Specimen Description BLOOD HAND LEFT  Final   Special Requests BOTTLES DRAWN AEROBIC AND ANAEROBIC 5CC  Final   Culture   Final    GRAM NEGATIVE RODS Note: Gram Stain Report Called to,Read Back By and Verified With: REBECCA RN ON 6E AT 1104 [id number] BY CASTC Performed at [facility]    Report Status PENDING  Incomplete  Culture, blood (routine x 2)     Status: None (Preliminary result)   Collection Time: 08/22/14  6:40 PM  Result Value Ref Range Status   Specimen Description BLOOD FOREARM LEFT  Final   Special Requests BOTTLES DRAWN AEROBIC AND ANAEROBIC 5CC  Final   Culture   Final           BLOOD CULTURE RECEIVED NO GROWTH TO DATE CULTURE WILL BE HELD FOR 5 DAYS BEFORE ISSUING A FINAL NEGATIVE REPORT Performed at [facility]    Report Status PENDING  Incomplete    Assessment: 53 yom continuing on cefepime D#4 for GNR bacteremia/Ecoli UTI (pan-S). Scr is elevated above baseline, decreased 2.78>>1.88 (baseline 1.3-1.4), CrCl~58. Known history of renal transplant. Afeb, wbc decreased to 16.2. MD aware of PCN allergy  but ok with ceph  Vanc 2/18>>2/20 Cefepime 2/18>>  2/18 BCx2>>GNR 1/2 2/18 UC>>e.coli >100k (pan-S)  Goal of Therapy:  Vancomycin trough level 15-20 mcg/ml  Plan:  -Increase cefepime to 1gm IV Q12H with improving renal function -F/u renal fxn, BC&S, clinical status, abx de-esc plan  Babs BertinHaley Bao Coreas, PharmD Clinical Pharmacist - Resident Pager [phone] 08/25/2014 9:08 AM

## 2014-08-25 NOTE — Progress Notes (Signed)
Subjective:  No more chest pain- i started him on protonix-  Making urine- creatinine trending down.  Urine culture pan sensitive e coli- blood culture presumably same- blood pressure is up  Objective Vital signs in last 24 hours: Filed Vitals:   08/24/14 1720 08/24/14 2039 08/25/14 0442 08/25/14 1000  BP: 139/62 180/102 187/104 168/90  Pulse: 66 95 88 98  Temp: 98 F (36.7 C) 98.8 F (37.1 C) 97.8 F (36.6 C) 98.2 F (36.8 C)  TempSrc: Oral   Oral  Resp: 18 17 18 18   Height:      Weight:  115.667 kg (255 lb)    SpO2: 96% 97% 99% 98%   Weight change: 1.361 kg (3 lb)  Intake/Output Summary (Last 24 hours) at 08/25/14 1125 Last data filed at 08/25/14 0900  Gross per 24 hour  Intake    480 ml  Output    700 ml  Net   -220 ml    Assessment/ Plan: Pt is a 54 y.o. yo male with multiple medical issues including s/p DD extended criteria transplant in 2004  who was admitted on 08/22/2014 with  Fever, leukocytosis, A on CRF with evidence of UTI/sepsis Assessment/Plan: 1. Renal- A on CRF in the setting of febrile syndrome UTI/pyelo with sepsis.  However, also had missed prograf so rejection is in the differential.  Baseline creatinine is 1.3-1.4.  Creatinine down to 1.88 today.  Is making urine.  Took prednisone down to 30 today, 20 tomorrow and continue taper 2. ID- fever, leukocytosis- with evidence of UTI by U/A- urine culture e coli and blood culture gram negative rods, he is on empiric maxipime - sensitivities indicate pan sensitive org- will continue maxipime while inhouse but look to change over to PO med 3. Anemia- not an issue at this time 4. HTN/volume- blood pressure higher on no meds for now.  Is normally on lopressor at home.  Will resume metoprolol and stop IVF 5. Transplant meds- myfortic 720 BID and prograf 4 BID at home.  Started on prednisone 40 per Dr. Lowell GuitarPowell for possible rejection.  Took down to 30 today, 20 tomorrow and continue to wean down to home dose of 5 daily 6.  Chest pain- seems more consistent with GERD with prednisone- started protonix- seems to have helped- to continue short term 7. Dispo- if creatinine down more tomorrow- consider discharge in AM  Taniela Feltus A    Labs: Basic Metabolic Panel:  Recent Labs Lab 08/23/14 1148 08/24/14 1410 08/25/14 0610  NA 130* 130* 135  K 4.7 4.3 4.3  CL 101 102 109  CO2 20 17* 22  GLUCOSE 335* 455* 249*  BUN 58* 52* 47*  CREATININE 2.78* 2.37* 1.88*  CALCIUM 8.5 8.2* 8.8  PHOS  --  2.4 2.6   Liver Function Tests:  Recent Labs Lab 08/22/14 1624 08/24/14 1410 08/25/14 0610  AST 21  --   --   ALT 15  --   --   ALKPHOS 101  --   --   BILITOT 2.1*  --   --   PROT 6.4  --   --   ALBUMIN 2.1* 2.0* 2.1*   No results for input(s): LIPASE, AMYLASE in the last 168 hours. No results for input(s): AMMONIA in the last 168 hours. CBC:  Recent Labs Lab 08/22/14 1624 08/23/14 1148 08/25/14 0610  WBC 23.8* 23.4* 16.2*  NEUTROABS 19.3*  --   --   HGB 11.9* 10.8* 11.8*  HCT 36.1* 33.0* 36.0*  MCV  83.8 81.9 83.3  PLT 140* 134* 194   Cardiac Enzymes:  Recent Labs Lab 08/24/14 0643 08/24/14 1410 08/24/14 1946  TROPONINI 0.03 0.11* 0.03   CBG:  Recent Labs Lab 08/24/14 0806 08/24/14 1153 08/24/14 1644 08/24/14 2037 08/25/14 0743  GLUCAP 289* 318* 489* 435* 245*    Iron Studies: No results for input(s): IRON, TIBC, TRANSFERRIN, FERRITIN in the last 72 hours. Studies/Results: No results found. Medications: Infusions: . sodium chloride 100 mL/hr at 08/24/14 1747    Scheduled Medications: . ceFEPime (MAXIPIME) IV  1 g Intravenous Q12H  . docusate sodium  100 mg Oral BID  . heparin  5,000 Units Subcutaneous 3 times per day  . insulin aspart  0-15 Units Subcutaneous TID WC  . insulin aspart  5 Units Subcutaneous TID WC  . insulin glargine  30 Units Subcutaneous QHS  .  morphine injection  2 mg Intravenous Once  . mycophenolate  720 mg Oral BID  . pantoprazole  40 mg  Oral Daily  . predniSONE  30 mg Oral Q breakfast  . tacrolimus  4 mg Oral BID    have reviewed scheduled and prn medications.  Physical Exam: General: NAD Heart: RRR Lungs: mostly clear Abdomen: soft, non tender Extremities: no edema    08/25/2014,11:25 AM  LOS: 3 days

## 2014-08-25 NOTE — Progress Notes (Signed)
PROGRESS NOTE  Trevor Thompson IRJ:[id number] DOB: 11/25/1960 DOA: 08/22/2014 PCP: Jeanann Lewandowsky, MD    Brief history 54 year old male with a history of hypertension, diabetes mellitus, peripheral vascular disease, renal transplant presents with 5 day history of abdominal pain and fevers at home. The patient went to see his nephrologist, Dr. Lowell Guitar, on 08/21/2014. The patient had blood work done including blood cultures during that office visit. He was noted to have WBC 26.9 with a left shift, serum creatinine 2.26. He continued to feel poorly. As a result, he was obstructed to come to the emergency department for further evaluation. Because the patient has been feeling poorly, he has not taken his immunosuppressive medications for 2 days prior to admission. The patient was noted to have WBC 23.8 with tachycardia and fever in the emergency department. Urinalysis showed significant pyuria. The patient was admitted for sepsis. He was started on empiric vancomycin and cefepime. Vancomycin was discontinued when cultures came back GNR. Assessment/Plan: Sepsis -Present at the time of admission-->improving -Likely secondary to UTI/pyelonephritis of his transplant kidney and Gram Negative Bacteremia -Continue empiric vancomycin and cefepime pending culture data -IV fluids -Stress steroids--continue to wean Gram negative Bacteremia -source is urine -continue cefepime pending final culture data -hopeful to be able to send pt home with oral quinolone if isolate is susceptible UTI/pyelonephritis--EColi -Ultrasound of transplanted kidney--Borderline elevated resistive indices-- no fluid collection or hydronephrosis -Continue antibiotics as discussed  Acute on chronic renal failure (CKD2) -Dr. Roanna Banning note reveals that the patient had a serum creatinine of 1.38 on 06/04/2014  -This is likely due to the patient's sepsis, hypovolemia although transplant rejection remains in  differential -improving with IVF and abx Atypical chest pain/elevated troponin -Likely demand ischemia in the setting of acute on chronic renal failure -Doubt ACS hyperkalemia  -Improved  -He was treated with Kayexalate 08/22/2014  Diabetes mellitus type 2  -Patient states that he is presently taking Lantus 20 units twice a day at home  -increase Lantus to 40 units qhs -novolog 8 units premeal -NovoLog sliding scale  -Hemoglobin A1c--pending Hypertension  -Restart metoprolol tartrate   Family Communication: Pt at beside Disposition Plan: Home 1-2 days   Procedures/Studies: Dg Chest 2 View  08/22/2014   CLINICAL DATA:  Fatigue and fever for 4 days.  EXAM: CHEST  2 VIEW  COMPARISON:  Single view of the chest 08/12/2014 and PA and lateral chest 07/22/2013.  FINDINGS: The lungs are clear. Heart size is upper normal. No pneumothorax or pleural effusion. No focal bony abnormality.  IMPRESSION: No acute disease.   Electronically Signed   By: Drusilla Kanner M.D.   On: 08/22/2014 18:27   US Renal Transplant W/doppler  08/23/2014   CLINICAL DATA:  Acute renal failure and renal transplant.  EXAM: ULTRASOUND OF RENAL TRANSPLANT WITH DOPPLER  TECHNIQUE: Ultrasound examination of the renal transplant was performed with gray-scale, color and duplex doppler evaluation.  COMPARISON:  None.  FINDINGS: Transplant kidney location:  Right lower quadrant  Transplant kidney:  Length: 14 cm, of indeterminate significance without comparison. Corticomedullary differentiation is within normal limits for superficial position. There is no hydronephrosis, urothelial thickening, or fluid collection.  Color flow in the main renal artery:  Present  Color flow in the main renal vein:  Present  Duplex Doppler Evaluation  Resistive index in main renal artery: 0.7. Peak systolic velocity is 113 centimeters/seconds.  Venous waveform in main renal vein:  Normal  Resistive index in upper  pole intrarenal artery: 0.63   Resistive index in lower pole intrarenal artery: 0.66  Bladder: Normal for degree of bladder distention.  Other findings: Incidentally visualized numerous gallstones with gallbladder distention.  IMPRESSION: 1. Borderline elevated resistive indices. Without baseline it is difficult to determine relationship to patient's acute renal failure. 2. No negative for renal artery stenosis or renal vein thrombosis. 3. No urinary obstruction or fluid collection. 4. Cholelithiasis.   Electronically Signed   By: Marnee Spring M.D.   On: 08/23/2014 10:18   Dg Chest Port 1 View  08/12/2014   CLINICAL DATA:  Sudden onset left upper chest pain and low blood pressure this p.m.  EXAM: PORTABLE CHEST - 1 VIEW  COMPARISON:  07/28/2013  FINDINGS: Shallow inspiration with linear fibrosis or atelectasis in the right mid lung. Heart size and pulmonary vascularity are normal for technique. No focal airspace disease. No blunting of costophrenic angles. No pneumothorax. Calcification of the aorta.  IMPRESSION: No active disease.   Electronically Signed   By: Burman Nieves M.D.   On: 08/12/2014 22:22         Subjective:Patient is feeling much better. Abdominal pain has resolved. Denies any fevers, chills, chest pain, dyspnea, nausea, vomiting, diarrhea, belly pain, dysuria.  Objective: Filed Vitals:   08/24/14 2039 08/25/14 0442 08/25/14 1000 08/25/14 1700  BP: 180/102 187/104 168/90 146/82  Pulse: 95 88 98 96  Temp: 98.8 F (37.1 C) 97.8 F (36.6 C) 98.2 F (36.8 C) 98.4 F (36.9 C)  TempSrc:   Oral Oral  Resp: Height:      Weight: 115.667 kg (255 lb)     SpO2: 97% 99% 98% 98%    Intake/Output Summary (Last 24 hours) at 08/25/14 1800 Last data filed at 08/25/14 1300  Gross per 24 hour  Intake    600 ml  Output    700 ml  Net   -100 ml   Weight change: 1.361 kg (3 lb) Exam:   General:  Pt is alert, follows commands appropriately, not in acute distress  HEENT: No icterus, No thrush,   /AT  Cardiovascular: RRR, S1/S2, no rubs, no gallops  Respiratory: CTA bilaterally, no wheezing, no crackles, no rhonchi  Abdomen: Soft/+BS, non tender, non distended, no guarding  Extremities: trace LE edema, No lymphangitis, No petechiae, No rashes, no synovitis  Data Reviewed: Basic Metabolic Panel:  Recent Labs Lab 08/22/14 1624 08/22/14 2028 08/23/14 1148 08/24/14 1410 08/25/14 0610  NA 127*  --  130* 130* 135  K 6.0* 4.5 4.7 4.3 4.3  CL 97  --  101 102 109  CO2 21  --  20 17* 22  GLUCOSE 326*  --  335* 455* 249*  BUN 54*  --  58* 52* 47*  CREATININE 2.97*  --  2.78* 2.37* 1.88*  CALCIUM 8.7  --  8.5 8.2* 8.8  PHOS  --   --   --  2.4 2.6   Liver Function Tests:  Recent Labs Lab 08/22/14 1624 08/24/14 1410 08/25/14 0610  AST 21  --   --   ALT 15  --   --   ALKPHOS 101  --   --   BILITOT 2.1*  --   --   PROT 6.4  --   --   ALBUMIN 2.1* 2.0* 2.1*   No results for input(s): LIPASE, AMYLASE in the last 168 hours. No results for input(s): AMMONIA in the last 168 hours. CBC:  Recent Labs  Lab 08/22/14 1624 08/23/14 1148 08/25/14 0610  WBC 23.8* 23.4* 16.2*  NEUTROABS 19.3*  --   --   HGB 11.9* 10.8* 11.8*  HCT 36.1* 33.0* 36.0*  MCV 83.8 81.9 83.3  PLT 140* 134* 194   Cardiac Enzymes:  Recent Labs Lab 08/24/14 0643 08/24/14 1410 08/24/14 1946  TROPONINI 0.03 0.11* 0.03   BNP: Invalid input(s): POCBNP CBG:  Recent Labs Lab 08/24/14 1644 08/24/14 2037 08/25/14 0743 08/25/14 1145 08/25/14 1634  GLUCAP 489* 435* 245* 397* 307*    Recent Results (from the past 240 hour(s))  Urine culture     Status: None   Collection Time: 08/22/14  4:58 PM  Result Value Ref Range Status   Specimen Description URINE, CLEAN CATCH  Final   Special Requests Immunocompromised  Final   Colony Count   Final    >=100,000 COLONIES/ML Performed at [facility]    Culture   Final    ESCHERICHIA COLI Performed at [facility]    Report  Status 08/24/2014 FINAL  Final   Organism ID, Bacteria ESCHERICHIA COLI  Final      Susceptibility   Escherichia coli - MIC*    AMPICILLIN RESISTANT      CEFAZOLIN <=4 SENSITIVE Sensitive     CEFTRIAXONE <=1 SENSITIVE Sensitive     CIPROFLOXACIN <=0.25 SENSITIVE Sensitive     GENTAMICIN <=1 SENSITIVE Sensitive     LEVOFLOXACIN <=0.12 SENSITIVE Sensitive     NITROFURANTOIN <=16 SENSITIVE Sensitive     TOBRAMYCIN <=1 SENSITIVE Sensitive     TRIMETH/SULFA <=20 SENSITIVE Sensitive     PIP/TAZO <=4 SENSITIVE Sensitive     * ESCHERICHIA COLI  Culture, blood (routine x 2)     Status: None (Preliminary result)   Collection Time: 08/22/14  6:33 PM  Result Value Ref Range Status   Specimen Description BLOOD HAND LEFT  Final   Special Requests BOTTLES DRAWN AEROBIC AND ANAEROBIC 5CC  Final   Culture   Final    GRAM NEGATIVE RODS Note: Gram Stain Report Called to,Read Back By and Verified With: REBECCA RN ON 6E AT 1104 [id number] BY CASTC Performed at [facility]    Report Status PENDING  Incomplete  Culture, blood (routine x 2)     Status: None (Preliminary result)   Collection Time: 08/22/14  6:40 PM  Result Value Ref Range Status   Specimen Description BLOOD FOREARM LEFT  Final   Special Requests BOTTLES DRAWN AEROBIC AND ANAEROBIC 5CC  Final   Culture   Final           BLOOD CULTURE RECEIVED NO GROWTH TO DATE CULTURE WILL BE HELD FOR 5 DAYS BEFORE ISSUING A FINAL NEGATIVE REPORT Performed at [facility]    Report Status PENDING  Incomplete     Scheduled Meds: . ceFEPime (MAXIPIME) IV  1 g Intravenous Q12H  . docusate sodium  100 mg Oral BID  . heparin  5,000 Units Subcutaneous 3 times per day  . insulin aspart  0-20 Units Subcutaneous TID WC  . insulin aspart  0-5 Units Subcutaneous QHS  . insulin aspart  8 Units Subcutaneous TID WC  . insulin glargine  40 Units Subcutaneous QHS  . metoprolol tartrate  25 mg Oral BID  .  morphine injection  2 mg Intravenous Once   . mycophenolate  720 mg Oral BID  . pantoprazole  40 mg Oral Daily  . [START ON 08/26/2014] predniSONE  20 mg Oral Q breakfast  .  tacrolimus  4 mg Oral BID   Continuous Infusions:    Daune Divirgilio, DO  [facility] Pager [phone]  If 7PM-7AM, please contact night-coverage [email or web address] Password [email or web address] 08/25/2014, 6:00 PM   LOS: 3 days

## 2014-08-25 NOTE — Progress Notes (Signed)
Utilization review completed.  

## 2014-08-26 LAB — BASIC METABOLIC PANEL WITH GFR
Anion gap: 7 (ref 5–15)
BUN: 39 mg/dL — ABNORMAL HIGH (ref 6–23)
CO2: 21 mmol/L (ref 19–32)
Calcium: 8.5 mg/dL (ref 8.4–10.5)
Chloride: 106 mmol/L (ref 96–112)
Creatinine, Ser: 1.74 mg/dL — ABNORMAL HIGH (ref 0.50–1.35)
GFR calc Af Amer: 50 mL/min — ABNORMAL LOW
GFR calc non Af Amer: 43 mL/min — ABNORMAL LOW
Glucose, Bld: 200 mg/dL — ABNORMAL HIGH (ref 70–99)
Potassium: 4 mmol/L (ref 3.5–5.1)
Sodium: 134 mmol/L — ABNORMAL LOW (ref 135–145)

## 2014-08-26 LAB — CBC
HEMATOCRIT: 34.3 % — AB (ref 39.0–52.0)
Hemoglobin: 11.2 g/dL — ABNORMAL LOW (ref 13.0–17.0)
MCH: 26.4 pg (ref 26.0–34.0)
MCHC: 32.7 g/dL (ref 30.0–36.0)
MCV: 80.7 fL (ref 78.0–100.0)
Platelets: 230 10*3/uL (ref 150–400)
RBC: 4.25 MIL/uL (ref 4.22–5.81)
RDW: 14.2 % (ref 11.5–15.5)
WBC: 16.2 10*3/uL — ABNORMAL HIGH (ref 4.0–10.5)

## 2014-08-26 LAB — CULTURE, BLOOD (ROUTINE X 2)

## 2014-08-26 LAB — HEMOGLOBIN A1C
Hgb A1c MFr Bld: 10.7 % — ABNORMAL HIGH (ref 4.8–5.6)
Mean Plasma Glucose: 260 mg/dL

## 2014-08-26 LAB — GLUCOSE, CAPILLARY
GLUCOSE-CAPILLARY: 347 mg/dL — AB (ref 70–99)
GLUCOSE-CAPILLARY: 168 mg/dL — AB (ref 70–99)
Glucose-Capillary: 236 mg/dL — ABNORMAL HIGH (ref 70–99)
Glucose-Capillary: 268 mg/dL — ABNORMAL HIGH (ref 70–99)
Glucose-Capillary: 264 mg/dL — ABNORMAL HIGH (ref 70–99)

## 2014-08-26 MED ORDER — PREDNISONE 10 MG PO TABS
10.0000 mg | ORAL_TABLET | Freq: Every day | ORAL | Status: DC
  Administered 2014-08-27: 10 mg via ORAL
  Filled 2014-08-26 (×2): qty 1

## 2014-08-26 MED ORDER — INSULIN GLARGINE 100 UNIT/ML ~~LOC~~ SOLN
50.0000 [IU] | Freq: Every day | SUBCUTANEOUS | Status: DC
  Administered 2014-08-26: 50 [IU] via SUBCUTANEOUS
  Filled 2014-08-26 (×2): qty 0.5

## 2014-08-26 NOTE — Progress Notes (Signed)
Assessment/ Plan: Pt is a 54 y.o. yo male with multiple medical issues including s/p DD extended criteria transplant in 2004 who was admitted on 08/22/2014 with Fever, leukocytosis, A on CRF with evidence of UTI/sepsis Assessment/Plan: 1. Renal- A on CRF in a transplanted kidney in the setting of febrile syndrome UTI/pyelo with sepsis. Stable and should be OK for discharge with wean of prednisone to 10 mg per day over two weeks until seen by me.  We will schedule labs in a week.  Subjective: Interval History: none.better  Objective: Vital signs in last 24 hours: Temp:  [97.7 F (36.5 C)-98.5 F (36.9 C)] 97.7 F (36.5 C) (02/22 0842) Pulse Rate:  [80-96] 90 (02/22 0842) Resp:  [17-18] 18 (02/22 0842) BP: (146-179)/(82-103) 150/93 mmHg (02/22 0842) SpO2:  [95 %-98 %] 96 % (02/22 0842) Weight:  [117.028 kg (258 lb)] 117.028 kg (258 lb) (02/21 2052) Weight change: 1.361 kg (3 lb)  Intake/Output from previous day: 02/21 0701 - 02/22 0700 In: 600 [P.O.:600] Out: 2200 [Urine:2200] Intake/Output this shift: Total I/O In: 240 [P.O.:240] Out: -   General appearance: alert and cooperative Resp: clear to auscultation bilaterally Cardio: regular rate and rhythm, S1, S2 normal, no murmur, click, rub or gallop GI: soft, non-tender; bowel sounds normal; no masses,  no organomegaly Extremities: BKA  Lab Results:  Recent Labs  08/25/14 0610 08/26/14 0614  WBC 16.2* 16.2*  HGB 11.8* 11.2*  HCT 36.0* 34.3*  PLT 194 230   BMET:  Recent Labs  08/25/14 0610 08/26/14 0614  NA 135 134*  K 4.3 4.0  CL 109 106  CO2 22 21  GLUCOSE 249* 200*  BUN 47* 39*  CREATININE 1.88* 1.74*  CALCIUM 8.8 8.5   No results for input(s): PTH in the last 72 hours. Iron Studies: No results for input(s): IRON, TIBC, TRANSFERRIN, FERRITIN in the last 72 hours. Studies/Results: No results found.  Scheduled: . ceFEPime (MAXIPIME) IV  1 g Intravenous Q12H  . docusate sodium  100 mg Oral BID  .  heparin  5,000 Units Subcutaneous 3 times per day  . insulin aspart  0-20 Units Subcutaneous TID WC  . insulin aspart  0-5 Units Subcutaneous QHS  . insulin aspart  8 Units Subcutaneous TID WC  . insulin glargine  40 Units Subcutaneous QHS  . metoprolol tartrate  25 mg Oral BID  .  morphine injection  2 mg Intravenous Once  . mycophenolate  720 mg Oral BID  . pantoprazole  40 mg Oral Daily  . predniSONE  20 mg Oral Q breakfast  . tacrolimus  4 mg Oral BID     LOS: 4 days   Craig Ionescu C 08/26/2014,2:43 PM

## 2014-08-26 NOTE — Progress Notes (Signed)
PROGRESS NOTE  Trevor Thompson ZOX:[id number] DOB: [birth year] DOA: 08/22/2014 PCP: Jeanann Lewandowsky, MD Brief history 54 year old male with a history of hypertension, diabetes mellitus, peripheral vascular disease, renal transplant presents with 5 day history of abdominal pain and fevers at home. The patient went to see his nephrologist, Dr. Lowell Guitar, on 08/21/2014. The patient had blood work done including blood cultures during that office visit. He was noted to have WBC 26.9 with a left shift, serum creatinine 2.26. He continued to feel poorly. As a result, he was obstructed to come to the emergency department for further evaluation. Because the patient has been feeling poorly, he has not taken his immunosuppressive medications for 2 days prior to admission. The patient was noted to have WBC 23.8 with tachycardia and fever in the emergency department. Urinalysis showed significant pyuria. The patient was admitted for sepsis. He was started on empiric vancomycin and cefepime. Vancomycin was discontinued when cultures came back GNR. Assessment/Plan: Sepsis -Present at the time of admission-->improving -Likely secondary to UTI/pyelonephritis of his transplant kidney and Gram Negative Bacteremia -Continue empiric vancomycin and cefepime pending culture data -IV fluids -Stress steroids--continue to wean--to prednisone 10mg  daily until f/u with renal EColi Bacteremia -source is urine -continue cefepime while in hospital -Plan to send patient home on ciprofloxacin for 9 additional days which will complete 14 days of therapy UTI/pyelonephritis--EColi -Ultrasound of transplanted kidney--Borderline elevated resistive indices-- no fluid collection or hydronephrosis -Continue antibiotics as discussed  Acute on chronic renal failure (CKD2) -Dr. Roanna Banning note reveals that the patient had a serum creatinine of 1.38 on 06/04/2014  -This is likely due to the patient's sepsis, hypovolemia although  transplant rejection remains in differential -improving with IVF and abx Atypical chest pain/elevated troponin -Likely demand ischemia in the setting of acute on chronic renal failure -Doubt ACS hyperkalemia  -Improved  -He was treated with Kayexalate 08/22/2014  Diabetes mellitus type 2  -Patient states that he is presently taking Lantus 20 units twice a day at home  -increase Lantus to 50 units qhs -novolog 8 units premeal -NovoLog sliding scale  -Hemoglobin A1c--10.7 Hypertension  -Restart metoprolol tartrate   Family Communication: Pt at beside Disposition Plan: Home 08/27/14 if stable      Procedures/Studies: Dg Chest 2 View  08/22/2014   CLINICAL DATA:  Fatigue and fever for 4 days.  EXAM: CHEST  2 VIEW  COMPARISON:  Single view of the chest 08/12/2014 and PA and lateral chest 07/22/2013.  FINDINGS: The lungs are clear. Heart size is upper normal. No pneumothorax or pleural effusion. No focal bony abnormality.  IMPRESSION: No acute disease.   Electronically Signed   By: Drusilla Kanner M.D.   On: 08/22/2014 18:27   US Renal Transplant W/doppler  08/23/2014   CLINICAL DATA:  Acute renal failure and renal transplant.  EXAM: ULTRASOUND OF RENAL TRANSPLANT WITH DOPPLER  TECHNIQUE: Ultrasound examination of the renal transplant was performed with gray-scale, color and duplex doppler evaluation.  COMPARISON:  None.  FINDINGS: Transplant kidney location:  Right lower quadrant  Transplant kidney:  Length: 14 cm, of indeterminate significance without comparison. Corticomedullary differentiation is within normal limits for superficial position. There is no hydronephrosis, urothelial thickening, or fluid collection.  Color flow in the main renal artery:  Present  Color flow in the main renal vein:  Present  Duplex Doppler Evaluation  Resistive index in main renal artery: 0.7. Peak systolic velocity is 113 centimeters/seconds.  Venous waveform in main  renal vein:  Normal  Resistive  index in upper pole intrarenal artery: 0.63  Resistive index in lower pole intrarenal artery: 0.66  Bladder: Normal for degree of bladder distention.  Other findings: Incidentally visualized numerous gallstones with gallbladder distention.  IMPRESSION: 1. Borderline elevated resistive indices. Without baseline it is difficult to determine relationship to patient's acute renal failure. 2. No negative for renal artery stenosis or renal vein thrombosis. 3. No urinary obstruction or fluid collection. 4. Cholelithiasis.   Electronically Signed   By: Marnee SpringJonathon  Watts M.D.   On: 08/23/2014 10:18   Dg Chest Port 1 View  08/12/2014   CLINICAL DATA:  Sudden onset left upper chest pain and low blood pressure this p.m.  EXAM: PORTABLE CHEST - 1 VIEW  COMPARISON:  07/28/2013  FINDINGS: Shallow inspiration with linear fibrosis or atelectasis in the right mid lung. Heart size and pulmonary vascularity are normal for technique. No focal airspace disease. No blunting of costophrenic angles. No pneumothorax. Calcification of the aorta.  IMPRESSION: No active disease.   Electronically Signed   By: Burman NievesWilliam  Stevens M.D.   On: 08/12/2014 22:22         Subjective: Patient denies fevers, chills, headache, chest pain, dyspnea, nausea, vomiting, diarrhea, abdominal pain, dysuria, hematuria   Objective: Filed Vitals:   08/25/14 2052 08/26/14 0442 08/26/14 0842 08/26/14 1630  BP: 169/103 179/95 150/93 153/90  Pulse: 86 80 90 89  Temp: 98.5 F (36.9 C) 98.4 F (36.9 C) 97.7 F (36.5 C) 98.5 F (36.9 C)  TempSrc:   Oral Oral  Resp: 17 18 18 18   Height:      Weight: 117.028 kg (258 lb)     SpO2: 95% 96% 96% 96%    Intake/Output Summary (Last 24 hours) at 08/26/14 1842 Last data filed at 08/26/14 1631  Gross per 24 hour  Intake    720 ml  Output   2000 ml  Net  -1280 ml   Weight change: 1.361 kg (3 lb) Exam:   General:  Pt is alert, follows commands appropriately, not in acute distress  HEENT: No  icterus, No thrush,  [place]/AT  Cardiovascular: RRR, S1/S2, no rubs, no gallops  Respiratory: CTA bilaterally, no wheezing, no crackles, no rhonchi  Abdomen: Soft/+BS, non tender, non distended, no guarding  Extremities: 1+RLE edema, No lymphangitis, No petechiae, No rashes, no synovitis  Data Reviewed: Basic Metabolic Panel:  Recent Labs Lab 08/22/14 1624 08/22/14 2028 08/23/14 1148 08/24/14 1410 08/25/14 0610 08/26/14 0614  NA 127*  --  130* 130* 135 134*  K 6.0* 4.5 4.7 4.3 4.3 4.0  CL 97  --  101 102 109 106  CO2 21  --  20 17* 22 21  GLUCOSE 326*  --  335* 455* 249* 200*  BUN 54*  --  58* 52* 47* 39*  CREATININE 2.97*  --  2.78* 2.37* 1.88* 1.74*  CALCIUM 8.7  --  8.5 8.2* 8.8 8.5  PHOS  --   --   --  2.4 2.6  --    Liver Function Tests:  Recent Labs Lab 08/22/14 1624 08/24/14 1410 08/25/14 0610  AST 21  --   --   ALT 15  --   --   ALKPHOS 101  --   --   BILITOT 2.1*  --   --   PROT 6.4  --   --   ALBUMIN 2.1* 2.0* 2.1*   No results for input(s): LIPASE, AMYLASE in the last 168 hours.  No results for input(s): AMMONIA in the last 168 hours. CBC:  Recent Labs Lab 08/22/14 1624 08/23/14 1148 08/25/14 0610 08/26/14 0614  WBC 23.8* 23.4* 16.2* 16.2*  NEUTROABS 19.3*  --   --   --   HGB 11.9* 10.8* 11.8* 11.2*  HCT 36.1* 33.0* 36.0* 34.3*  MCV 83.8 81.9 83.3 80.7  PLT 140* 134* 194 230   Cardiac Enzymes:  Recent Labs Lab 08/24/14 0643 08/24/14 1410 08/24/14 1946  TROPONINI 0.03 0.11* 0.03   BNP: Invalid input(s): POCBNP CBG:  Recent Labs Lab 08/25/14 1634 08/25/14 2049 08/26/14 0734 08/26/14 1127 08/26/14 1625  GLUCAP 307* 305* 168* 236* 268*    Recent Results (from the past 240 hour(s))  Urine culture     Status: None   Collection Time: 08/22/14  4:58 PM  Result Value Ref Range Status   Specimen Description URINE, CLEAN CATCH  Final   Special Requests Immunocompromised  Final   Colony Count   Final    >=100,000  COLONIES/ML Performed at [facility]    Culture   Final    ESCHERICHIA COLI Performed at [facility]    Report Status 08/24/2014 FINAL  Final   Organism ID, Bacteria ESCHERICHIA COLI  Final      Susceptibility   Escherichia coli - MIC*    AMPICILLIN RESISTANT      CEFAZOLIN <=4 SENSITIVE Sensitive     CEFTRIAXONE <=1 SENSITIVE Sensitive     CIPROFLOXACIN <=0.25 SENSITIVE Sensitive     GENTAMICIN <=1 SENSITIVE Sensitive     LEVOFLOXACIN <=0.12 SENSITIVE Sensitive     NITROFURANTOIN <=16 SENSITIVE Sensitive     TOBRAMYCIN <=1 SENSITIVE Sensitive     TRIMETH/SULFA <=20 SENSITIVE Sensitive     PIP/TAZO <=4 SENSITIVE Sensitive     * ESCHERICHIA COLI  Culture, blood (routine x 2)     Status: None   Collection Time: 08/22/14  6:33 PM  Result Value Ref Range Status   Specimen Description BLOOD HAND LEFT  Final   Special Requests BOTTLES DRAWN AEROBIC AND ANAEROBIC 5CC  Final   Culture   Final    ESCHERICHIA COLI Note: Gram Stain Report Called to,Read Back By and Verified With: REBECCA RN ON 6E AT 1104 [id number] BY CASTC Performed at [facility]    Report Status 08/26/2014 FINAL  Final   Organism ID, Bacteria ESCHERICHIA COLI  Final      Susceptibility   Escherichia coli - MIC*    AMPICILLIN 8 SENSITIVE Sensitive     AMPICILLIN/SULBACTAM 4 SENSITIVE Sensitive     CEFAZOLIN <=4 SENSITIVE Sensitive     CEFEPIME <=1 SENSITIVE Sensitive     CEFTAZIDIME <=1 SENSITIVE Sensitive     CEFTRIAXONE <=1 SENSITIVE Sensitive     CIPROFLOXACIN <=0.25 SENSITIVE Sensitive     GENTAMICIN <=1 SENSITIVE Sensitive     IMIPENEM <=0.25 SENSITIVE Sensitive     PIP/TAZO <=4 SENSITIVE Sensitive     TOBRAMYCIN <=1 SENSITIVE Sensitive     TRIMETH/SULFA <=20 SENSITIVE Sensitive     * ESCHERICHIA COLI  Culture, blood (routine x 2)     Status: None (Preliminary result)   Collection Time: 08/22/14  6:40 PM  Result Value Ref Range Status   Specimen Description BLOOD FOREARM LEFT   Final   Special Requests BOTTLES DRAWN AEROBIC AND ANAEROBIC 5CC  Final   Culture   Final           BLOOD CULTURE RECEIVED NO GROWTH TO DATE CULTURE  WILL BE HELD FOR 5 DAYS BEFORE ISSUING A FINAL NEGATIVE REPORT Performed at [facility]    Report Status PENDING  Incomplete     Scheduled Meds: . ceFEPime (MAXIPIME) IV  1 g Intravenous Q12H  . docusate sodium  100 mg Oral BID  . heparin  5,000 Units Subcutaneous 3 times per day  . insulin aspart  0-20 Units Subcutaneous TID WC  . insulin aspart  0-5 Units Subcutaneous QHS  . insulin aspart  8 Units Subcutaneous TID WC  . insulin glargine  40 Units Subcutaneous QHS  . metoprolol tartrate  25 mg Oral BID  .  morphine injection  2 mg Intravenous Once  . mycophenolate  720 mg Oral BID  . pantoprazole  40 mg Oral Daily  . [START ON 08/27/2014] predniSONE  10 mg Oral Q breakfast  . tacrolimus  4 mg Oral BID   Continuous Infusions:    Josh Nicolosi, DO  [facility] Pager [phone]  If 7PM-7AM, please contact night-coverage [email or web address] Password [email or web address] 08/26/2014, 6:42 PM   LOS: 4 days

## 2014-08-27 LAB — BASIC METABOLIC PANEL
Anion gap: 4 — ABNORMAL LOW (ref 5–15)
BUN: 38 mg/dL — ABNORMAL HIGH (ref 6–23)
CO2: 24 mmol/L (ref 19–32)
Calcium: 8.7 mg/dL (ref 8.4–10.5)
Chloride: 107 mmol/L (ref 96–112)
Creatinine, Ser: 1.74 mg/dL — ABNORMAL HIGH (ref 0.50–1.35)
GFR calc Af Amer: 50 mL/min — ABNORMAL LOW (ref >90)
GFR, EST NON AFRICAN AMERICAN: 43 mL/min — AB (ref >90)
Glucose, Bld: 251 mg/dL — ABNORMAL HIGH (ref 70–99)
Potassium: 5.2 mmol/L — ABNORMAL HIGH (ref 3.5–5.1)
SODIUM: 135 mmol/L (ref 135–145)

## 2014-08-27 LAB — CBC
HEMATOCRIT: 34.3 % — AB (ref 39.0–52.0)
Hemoglobin: 11 g/dL — ABNORMAL LOW (ref 13.0–17.0)
MCH: 26.8 pg (ref 26.0–34.0)
MCHC: 32.1 g/dL (ref 30.0–36.0)
MCV: 83.7 fL (ref 78.0–100.0)
PLATELETS: 235 10*3/uL (ref 150–400)
RBC: 4.1 MIL/uL — ABNORMAL LOW (ref 4.22–5.81)
RDW: 14.4 % (ref 11.5–15.5)
WBC: 15.7 10*3/uL — AB (ref 4.0–10.5)

## 2014-08-27 LAB — GLUCOSE, CAPILLARY
Glucose-Capillary: 268 mg/dL — ABNORMAL HIGH (ref 70–99)
Glucose-Capillary: 228 mg/dL — ABNORMAL HIGH (ref 70–99)

## 2014-08-27 MED ORDER — INSULIN GLARGINE 100 UNIT/ML ~~LOC~~ SOLN
55.0000 [IU] | Freq: Every day | SUBCUTANEOUS | Status: DC
  Filled 2014-08-27: qty 0.55

## 2014-08-27 MED ORDER — PREDNISONE 10 MG PO TABS: 10.0000 mg | ORAL_TABLET | Freq: Every day | ORAL | Status: DC

## 2014-08-27 MED ORDER — CIPROFLOXACIN HCL 500 MG PO TABS
500.0000 mg | ORAL_TABLET | Freq: Two times a day (BID) | ORAL | Status: DC
  Administered 2014-08-27: 500 mg via ORAL
  Filled 2014-08-27 (×3): qty 1

## 2014-08-27 MED ORDER — CIPROFLOXACIN HCL 500 MG PO TABS: 500.0000 mg | ORAL_TABLET | Freq: Two times a day (BID) | ORAL | Status: DC

## 2014-08-27 MED ORDER — INSULIN GLARGINE 100 UNIT/ML ~~LOC~~ SOLN: 55.0000 [IU] | Freq: Every day | SUBCUTANEOUS | Status: DC

## 2014-08-27 NOTE — Progress Notes (Signed)
Patient refusing bed alarm and educated about fall prevention safety plan. Patient acknowledges and continues to refuse at this time. Informed patient to utilize call bell light and call for assistance. Patient verbalizes understanding. Staff will continue to monitor.     

## 2014-08-27 NOTE — Progress Notes (Signed)
Inpatient Diabetes Program Recommendations  AACE/ADA: New Consensus Statement on Inpatient Glycemic Control (2013)  Target Ranges:  Prepandial:   less than 140 mg/dL      Peak postprandial:   less than 180 mg/dL (1-2 hours)      Critically ill patients:  140 - 180 mg/dL     Results for Trevor Thompson, Trevor Thompson (MRN [id number]) as of 08/27/2014 11:32  Ref. Range 08/26/2014 07:34 08/26/2014 11:27 08/26/2014 16:25 08/26/2014 21:28  Glucose-Capillary Latest Range: 70-99 mg/dL 213168 (H) 086236 (H) 578268 (H) 264 (H)    Results for Trevor Thompson, Trevor Thompson (MRN [id number]) as of 08/27/2014 11:32  Ref. Range 08/27/2014 07:35  Glucose-Capillary Latest Range: 70-99 mg/dL 413268 (H)    Current Orders: Lantus 50 units QHS     Novolog Resistant SSI tid ac + HS     Novolog 8 units tidwc   **Note Prednisone reduced to 10 mg daily today  **Also note Lantus increased to 50 units QHS last PM  **Patient still having issues with elevated fasting and postprandial glucose levels  **Eating 100% of meals    MD- Please consider the following:  1. Increase Lantus to 55 units QHS  2. Increase Novolog Meal Coverage to 10 units tidwc    Will follow Ambrose FinlandJeannine Johnston Laraya Pestka RN, MSN, CDE Diabetes Coordinator Inpatient Diabetes Program Team Pager: [phone] (8a-10p)

## 2014-08-27 NOTE — Discharge Summary (Signed)
Physician Discharge Summary  Trevor Thompson ZOX:[id number] DOB: July 20, 1960 DOA: 08/22/2014  PCP: Jeanann Lewandowsky, MD  Admit date: 08/22/2014 Discharge date: 08/27/2014  Recommendations for Outpatient Follow-up:  1. Pt will need to follow up with PCP in 2 weeks post discharge 2. Please obtain BMP in one week 3. Follow-up with nephrology, Dr. Lowell Guitar in 2 weeks. 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 and Gram Negative Bacteremia -Continue empiric vancomycin and cefepime pending culture data -IV fluids -Stress steroids--continue to wean--to prednisone  daily until f/u with renal in office (previous home dose  daily) EColi Bacteremia -source is urine -continue cefepime while in hospital -Plan to send patient home on ciprofloxacin for 9 additional days which will complete 14 days of therapy UTI/pyelonephritis--EColi -Ultrasound of transplanted kidney--Borderline elevated resistive indices-- no fluid collection or hydronephrosis -Continue antibiotics as discussed  Acute on chronic renal failure (CKD2) -Dr. Roanna Banning note reveals that the patient had a serum creatinine of 1.38 on 06/04/2014  -This is likely due to the patient's sepsis, hypovolemia although transplant rejection remains in differential -improving with IVF and abx Atypical chest pain/elevated troponin -Likely demand ischemia in the setting of acute on chronic renal failure -Doubt ACS hyperkalemia  -Improved  -He was treated with Kayexalate 08/22/2014  Diabetes mellitus type 2  -Patient states that he is presently taking Lantus 20 units twice a day at home  -increase Lantus to 55 units qhs -novolog 8 units premeal -NovoLog sliding scale  -Hemoglobin A1c--10.7 -Patient revealed to me that he had been taking Lantus that was given to him by his family. Apparently the patient was given 6 Lantus pens which she  still has at home. I told the patient to continue his Lantus 55 units at bedtime for now.. As he expressed difficulty in affording the Lantus after his Lantus pens run out, I encouraged him to follow-up with his primary care provider to possibly switch him to 70/30 insulin which would be more affordable. Hypertension  -Restart metoprolol tartrate   Family Communication: Pt at beside Disposition Plan: Home 08/27/14 if stable  Discharge Condition: stable  Disposition:  Follow-up Information    Follow up with [facility], MD In 2 weeks.   Specialty:  Nephrology   Contact information:   [address]                          [place] [address] [phone]     home  Diet:carb modified Wt Readings from Last 3 Encounters:  08/26/14 116.62 kg (257 lb 1.6 oz)  08/13/14 115.7 kg (255 lb 1.2 oz)  09/07/13 106.051 kg (233 lb 12.8 oz)    History of present illness:  54 year old male with a history of hypertension, diabetes mellitus, peripheral vascular disease, renal transplant presents with 5 day history of abdominal pain and fevers at home. The patient went to see his nephrologist, Dr. Lowell Guitar, on 08/21/2014. The patient had blood work done including blood cultures during that office visit. He was noted to have WBC 26.9 with a left shift, serum creatinine 2.26. He continued to feel poorly. As a result, he was obstructed to come to the emergency department for further evaluation. Because the patient has been feeling poorly, he has not taken his immunosuppressive medications for 2 days prior to admission. The patient was noted to have WBC 23.8 with tachycardia and fever in the emergency department. Urinalysis showed significant pyuria. The patient  was admitted for sepsis. He was started on empiric vancomycin and cefepime. Vancomycin was discontinued when cultures came back GNR. Based upon Escherichia coli that grew from cultures, the patient was switched to ciprofloxacin. He'll go home  with no more days of ciprofloxacin to finish 14 days of therapy.  Consultants: Renal  Discharge Exam: Filed Vitals:   08/27/14 0900  BP: 148/88  Pulse: 92  Temp:   Resp:    Filed Vitals:   08/26/14 1630 08/26/14 2132 08/27/14 0505 08/27/14 0900  BP: 153/90 157/98 158/92 148/88  Pulse: 89 87 85 92  Temp: 98.5 F (36.9 C) 98.4 F (36.9 C) 97.8 F (36.6 C)   TempSrc: Oral Oral Oral   Resp: 18 20 18    Height:      Weight:  116.62 kg (257 lb 1.6 oz)    SpO2: 96% 93% 97% 99%   General: A&O x 3, NAD, pleasant, cooperative Cardiovascular: RRR, no rub, no gallop, no S3 Respiratory: CTAB, no wheeze, no rhonchi Abdomen:soft, nontender, nondistended, positive bowel sounds Extremities: 1+RLE edema, No lymphangitis, no petechiae  Discharge Instructions      Discharge Instructions    Diet - low sodium heart healthy    Complete by:  As directed      Increase activity slowly    Complete by:  As directed             Medication List    STOP taking these medications        furosemide 40 MG tablet  Commonly known as:  LASIX     insulin aspart protamine- aspart (70-30) 100 UNIT/ML injection  Commonly known as:  NOVOLOG MIX 70/30      TAKE these medications        ciprofloxacin 500 MG tablet  Commonly known as:  CIPRO  Take 1 tablet (500 mg total) by mouth 2 (two) times daily.     insulin glargine 100 UNIT/ML injection  Commonly known as:  LANTUS  Inject 0.55 mLs (55 Units total) into the skin at bedtime.     metoprolol tartrate 25 MG tablet  Commonly known as:  LOPRESSOR  Take 1 tablet (25 mg total) by mouth 2 (two) times daily.     mycophenolate 360 MG Tbec EC tablet  Commonly known as:  MYFORTIC  Take 720 mg by mouth 2 (two) times daily. Take 2 Tablets (720 mg) By Mouth BID.     predniSONE 10 MG tablet  Commonly known as:  DELTASONE  Take 1 tablet (10 mg total) by mouth daily with breakfast.     tacrolimus 1 MG capsule  Commonly known as:  PROGRAF  Take 4  mg by mouth 2 (two) times daily. Take 4 Tablets (8 mg) By Mouth BID.         The results of significant diagnostics from this hospitalization (including imaging, microbiology, ancillary and laboratory) are listed below for reference.    Significant Diagnostic Studies: Dg Chest 2 View  08/22/2014   CLINICAL DATA:  Fatigue and fever for 4 days.  EXAM: CHEST  2 VIEW  COMPARISON:  Single view of the chest 08/12/2014 and PA and lateral chest 07/22/2013.  FINDINGS: The lungs are clear. Heart size is upper normal. No pneumothorax or pleural effusion. No focal bony abnormality.  IMPRESSION: No acute disease.   Electronically Signed   By: Drusilla Kannerhomas  Dalessio M.D.   On: 08/22/2014 18:27   [place] Renal Transplant W/doppler  08/23/2014   CLINICAL DATA:  Acute renal  failure and renal transplant.  EXAM: ULTRASOUND OF RENAL TRANSPLANT WITH DOPPLER  TECHNIQUE: Ultrasound examination of the renal transplant was performed with gray-scale, color and duplex doppler evaluation.  COMPARISON:  None.  FINDINGS: Transplant kidney location:  Right lower quadrant  Transplant kidney:  Length: 14 cm, of indeterminate significance without comparison. Corticomedullary differentiation is within normal limits for superficial position. There is no hydronephrosis, urothelial thickening, or fluid collection.  Color flow in the main renal artery:  Present  Color flow in the main renal vein:  Present  Duplex Doppler Evaluation  Resistive index in main renal artery: 0.7. Peak systolic velocity is 113 centimeters/seconds.  Venous waveform in main renal vein:  Normal  Resistive index in upper pole intrarenal artery: 0.63  Resistive index in lower pole intrarenal artery: 0.66  Bladder: Normal for degree of bladder distention.  Other findings: Incidentally visualized numerous gallstones with gallbladder distention.  IMPRESSION: 1. Borderline elevated resistive indices. Without baseline it is difficult to determine relationship to patient's acute renal  failure. 2. No negative for renal artery stenosis or renal vein thrombosis. 3. No urinary obstruction or fluid collection. 4. Cholelithiasis.   Electronically Signed   By: Marnee Spring M.D.   On: 08/23/2014 10:18   Dg Chest Port 1 View  08/12/2014   CLINICAL DATA:  Sudden onset left upper chest pain and low blood pressure this p.m.  EXAM: PORTABLE CHEST - 1 VIEW  COMPARISON:  07/28/2013  FINDINGS: Shallow inspiration with linear fibrosis or atelectasis in the right mid lung. Heart size and pulmonary vascularity are normal for technique. No focal airspace disease. No blunting of costophrenic angles. No pneumothorax. Calcification of the aorta.  IMPRESSION: No active disease.   Electronically Signed   By: Burman Nieves M.D.   On: 08/12/2014 22:22     Microbiology: Recent Results (from the past 240 hour(s))  Urine culture     Status: None   Collection Time: 08/22/14  4:58 PM  Result Value Ref Range Status   Specimen Description URINE, CLEAN CATCH  Final   Special Requests Immunocompromised  Final   Colony Count   Final    >=100,000 COLONIES/ML Performed at [facility]    Culture   Final    ESCHERICHIA COLI Performed at [facility]    Report Status 08/24/2014 FINAL  Final   Organism ID, Bacteria ESCHERICHIA COLI  Final      Susceptibility   Escherichia coli - MIC*    AMPICILLIN RESISTANT      CEFAZOLIN <=4 SENSITIVE Sensitive     CEFTRIAXONE <=1 SENSITIVE Sensitive     CIPROFLOXACIN <=0.25 SENSITIVE Sensitive     GENTAMICIN <=1 SENSITIVE Sensitive     LEVOFLOXACIN <=0.12 SENSITIVE Sensitive     NITROFURANTOIN <=16 SENSITIVE Sensitive     TOBRAMYCIN <=1 SENSITIVE Sensitive     TRIMETH/SULFA <=20 SENSITIVE Sensitive     PIP/TAZO <=4 SENSITIVE Sensitive     * ESCHERICHIA COLI  Culture, blood (routine x 2)     Status: None   Collection Time: 08/22/14  6:33 PM  Result Value Ref Range Status   Specimen Description BLOOD HAND LEFT  Final   Special Requests BOTTLES  DRAWN AEROBIC AND ANAEROBIC 5CC  Final   Culture   Final    ESCHERICHIA COLI Note: Gram Stain Report Called to,Read Back By and Verified With: REBECCA RN ON 6E AT 1610 [id number] BY CASTC Performed at [facility]    Report Status 08/26/2014 FINAL  Final   Organism ID, Bacteria ESCHERICHIA COLI  Final      Susceptibility   Escherichia coli - MIC*    AMPICILLIN 8 SENSITIVE Sensitive     AMPICILLIN/SULBACTAM 4 SENSITIVE Sensitive     CEFAZOLIN <=4 SENSITIVE Sensitive     CEFEPIME <=1 SENSITIVE Sensitive     CEFTAZIDIME <=1 SENSITIVE Sensitive     CEFTRIAXONE <=1 SENSITIVE Sensitive     CIPROFLOXACIN <=0.25 SENSITIVE Sensitive     GENTAMICIN <=1 SENSITIVE Sensitive     IMIPENEM <=0.25 SENSITIVE Sensitive     PIP/TAZO <=4 SENSITIVE Sensitive     TOBRAMYCIN <=1 SENSITIVE Sensitive     TRIMETH/SULFA <=20 SENSITIVE Sensitive     * ESCHERICHIA COLI  Culture, blood (routine x 2)     Status: None (Preliminary result)   Collection Time: 08/22/14  6:40 PM  Result Value Ref Range Status   Specimen Description BLOOD FOREARM LEFT  Final   Special Requests BOTTLES DRAWN AEROBIC AND ANAEROBIC 5CC  Final   Culture   Final           BLOOD CULTURE RECEIVED NO GROWTH TO DATE CULTURE WILL BE HELD FOR 5 DAYS BEFORE ISSUING A FINAL NEGATIVE REPORT Performed at [facility]    Report Status PENDING  Incomplete     Labs: Basic Metabolic Panel:  Recent Labs Lab 08/23/14 1148 08/24/14 1410 08/25/14 0610 08/26/14 0614 08/27/14 0555  NA 130* 130* 135 134* 135  K 4.7 4.3 4.3 4.0 5.2*  CL 101 102 109 106 107  CO2 20 17* 22 21 24   GLUCOSE 335* 455* 249* 200* 251*  BUN 58* 52* 47* 39* 38*  CREATININE 2.78* 2.37* 1.88* 1.74* 1.74*  CALCIUM 8.5 8.2* 8.8 8.5 8.7  PHOS  --  2.4 2.6  --   --    Liver Function Tests:  Recent Labs Lab 08/22/14 1624 08/24/14 1410 08/25/14 0610  AST 21  --   --   ALT 15  --   --   ALKPHOS 101  --   --   BILITOT 2.1*  --   --   PROT 6.4  --   --     ALBUMIN 2.1* 2.0* 2.1*   No results for input(s): LIPASE, AMYLASE in the last 168 hours. No results for input(s): AMMONIA in the last 168 hours. CBC:  Recent Labs Lab 08/22/14 1624 08/23/14 1148 08/25/14 0610 08/26/14 0614 08/27/14 0555  WBC 23.8* 23.4* 16.2* 16.2* 15.7*  NEUTROABS 19.3*  --   --   --   --   HGB 11.9* 10.8* 11.8* 11.2* 11.0*  HCT 36.1* 33.0* 36.0* 34.3* 34.3*  MCV 83.8 81.9 83.3 80.7 83.7  PLT 140* 134* 194 230 235   Cardiac Enzymes:  Recent Labs Lab 08/24/14 0643 08/24/14 1410 08/24/14 1946  TROPONINI 0.03 0.11* 0.03   BNP: Invalid input(s): POCBNP CBG:  Recent Labs Lab 08/26/14 0734 08/26/14 1127 08/26/14 1625 08/26/14 2128 08/27/14 0735  GLUCAP 168* 236* 268* 264* 268*    Time coordinating discharge:  Greater than 30 minutes  Signed:  Edna Grover, DO [facility] Pager: [phone] 08/27/2014, 12:02 PM

## 2014-08-29 LAB — CULTURE, BLOOD (ROUTINE X 2): Culture: NO GROWTH

## 2014-08-30 ENCOUNTER — Telehealth: Payer: Self-pay

## 2014-08-30 NOTE — Telephone Encounter (Signed)
Post-discharge Follow-Up Phone Call:  Date of Discharge: 08/27/14 Principal Discharge Diagnosis: Sepsis, E.coli bacteremia, UTI/pyelonephritis, acute on chronic renal failure, atypical chest pain, hyperkalemia, diabetes mellitus type 2, hypertension.  Call placed to patient for post-discharge follow-up, to determine if patient obtained discharge medications, and to discuss needed follow-up appointment with Dr. Hyman HopesJegede. Unable to reach patient; left voicemail requesting return call.

## 2014-09-10 ENCOUNTER — Inpatient Hospital Stay: Payer: Medicare Other | Admitting: Internal Medicine

## 2014-09-24 ENCOUNTER — Inpatient Hospital Stay: Payer: Medicare Other | Admitting: Internal Medicine

## 2014-10-08 ENCOUNTER — Other Ambulatory Visit: Payer: Self-pay | Admitting: Nephrology

## 2014-10-08 DIAGNOSIS — N179 Acute kidney failure, unspecified: Secondary | ICD-10-CM

## 2014-10-09 ENCOUNTER — Ambulatory Visit
Admission: RE | Admit: 2014-10-09 | Discharge: 2014-10-09 | Disposition: A | Discharge status: Home / Self Care | Payer: Medicare Other | Source: Ambulatory Visit | Attending: Nephrology | Admitting: Nephrology

## 2014-10-09 DIAGNOSIS — N179 Acute kidney failure, unspecified: Secondary | ICD-10-CM

## 2014-10-10 ENCOUNTER — Other Ambulatory Visit: Payer: Self-pay | Admitting: Family Medicine

## 2014-10-10 ENCOUNTER — Encounter: Payer: Self-pay | Admitting: Family Medicine

## 2014-10-10 ENCOUNTER — Ambulatory Visit: Payer: Medicare Other | Attending: Family Medicine | Admitting: Family Medicine

## 2014-10-10 VITALS — BP 139/95 | HR 100 | Temp 97.8°F | Resp 18 | Ht 71.0 in | Wt 275.0 lb

## 2014-10-10 DIAGNOSIS — Z89512 Acquired absence of left leg below knee: Secondary | ICD-10-CM | POA: Diagnosis not present

## 2014-10-10 DIAGNOSIS — E780 Pure hypercholesterolemia: Secondary | ICD-10-CM | POA: Diagnosis not present

## 2014-10-10 DIAGNOSIS — Z940 Kidney transplant status: Secondary | ICD-10-CM | POA: Diagnosis not present

## 2014-10-10 DIAGNOSIS — IMO0002 Reserved for concepts with insufficient information to code with codable children: Secondary | ICD-10-CM

## 2014-10-10 DIAGNOSIS — N186 End stage renal disease: Secondary | ICD-10-CM | POA: Insufficient documentation

## 2014-10-10 DIAGNOSIS — Z6838 Body mass index (BMI) 38.0-38.9, adult: Secondary | ICD-10-CM | POA: Insufficient documentation

## 2014-10-10 DIAGNOSIS — E1165 Type 2 diabetes mellitus with hyperglycemia: Secondary | ICD-10-CM

## 2014-10-10 DIAGNOSIS — N12 Tubulo-interstitial nephritis, not specified as acute or chronic: Secondary | ICD-10-CM

## 2014-10-10 DIAGNOSIS — I120 Hypertensive chronic kidney disease with stage 5 chronic kidney disease or end stage renal disease: Secondary | ICD-10-CM | POA: Insufficient documentation

## 2014-10-10 DIAGNOSIS — E669 Obesity, unspecified: Secondary | ICD-10-CM | POA: Diagnosis not present

## 2014-10-10 DIAGNOSIS — D849 Immunodeficiency, unspecified: Secondary | ICD-10-CM

## 2014-10-10 DIAGNOSIS — Z7952 Long term (current) use of systemic steroids: Secondary | ICD-10-CM | POA: Diagnosis not present

## 2014-10-10 DIAGNOSIS — Z89519 Acquired absence of unspecified leg below knee: Secondary | ICD-10-CM | POA: Insufficient documentation

## 2014-10-10 DIAGNOSIS — I10 Essential (primary) hypertension: Secondary | ICD-10-CM | POA: Insufficient documentation

## 2014-10-10 DIAGNOSIS — Z794 Long term (current) use of insulin: Secondary | ICD-10-CM

## 2014-10-10 DIAGNOSIS — Z87891 Personal history of nicotine dependence: Secondary | ICD-10-CM | POA: Diagnosis not present

## 2014-10-10 DIAGNOSIS — E118 Type 2 diabetes mellitus with unspecified complications: Secondary | ICD-10-CM | POA: Diagnosis not present

## 2014-10-10 DIAGNOSIS — N10 Acute tubulo-interstitial nephritis: Secondary | ICD-10-CM | POA: Diagnosis not present

## 2014-10-10 DIAGNOSIS — D899 Disorder involving the immune mechanism, unspecified: Secondary | ICD-10-CM | POA: Diagnosis not present

## 2014-10-10 DIAGNOSIS — Z79899 Other long term (current) drug therapy: Secondary | ICD-10-CM | POA: Diagnosis not present

## 2014-10-10 LAB — CBC WITH DIFFERENTIAL/PLATELET
BASOS ABS: 0 10*3/uL (ref 0.0–0.1)
Basophils Relative: 0 % (ref 0–1)
Eosinophils Absolute: 0.4 10*3/uL (ref 0.0–0.7)
Eosinophils Relative: 4 % (ref 0–5)
HEMATOCRIT: 38.5 % — AB (ref 39.0–52.0)
HEMOGLOBIN: 11.8 g/dL — AB (ref 13.0–17.0)
LYMPHS ABS: 2 10*3/uL (ref 0.7–4.0)
LYMPHS PCT: 21 % (ref 12–46)
MCH: 27.2 pg (ref 26.0–34.0)
MCHC: 30.6 g/dL (ref 30.0–36.0)
MCV: 88.7 fL (ref 78.0–100.0)
MPV: 12.5 fL — ABNORMAL HIGH (ref 8.6–12.4)
Monocytes Absolute: 1.4 10*3/uL — ABNORMAL HIGH (ref 0.1–1.0)
Monocytes Relative: 14 % — ABNORMAL HIGH (ref 3–12)
Neutro Abs: 5.9 10*3/uL (ref 1.7–7.7)
Neutrophils Relative %: 61 % (ref 43–77)
Platelets: 224 10*3/uL (ref 150–400)
RBC: 4.34 MIL/uL (ref 4.22–5.81)
RDW: 15.6 % — ABNORMAL HIGH (ref 11.5–15.5)
WBC: 9.7 10*3/uL (ref 4.0–10.5)

## 2014-10-10 LAB — BASIC METABOLIC PANEL
BUN: 46 mg/dL — AB (ref 6–23)
CHLORIDE: 112 meq/L (ref 96–112)
CO2: 21 mEq/L (ref 19–32)
Calcium: 8.6 mg/dL (ref 8.4–10.5)
Creat: 2.89 mg/dL — ABNORMAL HIGH (ref 0.50–1.35)
GLUCOSE: 80 mg/dL (ref 70–99)
Potassium: 5.7 mEq/L — ABNORMAL HIGH (ref 3.5–5.3)
Sodium: 142 mEq/L (ref 135–145)

## 2014-10-10 LAB — LIPID PANEL
Cholesterol: 232 mg/dL — ABNORMAL HIGH (ref 0–200)
HDL: 69 mg/dL (ref >=40)
LDL CALC: 121 mg/dL — AB (ref 0–99)
Total CHOL/HDL Ratio: 3.4 Ratio
Triglycerides: 208 mg/dL — ABNORMAL HIGH (ref <150)
VLDL: 42 mg/dL — AB (ref 0–40)

## 2014-10-10 LAB — GLUCOSE, POCT (MANUAL RESULT ENTRY): POC Glucose: 96 mg/dl (ref 70–99)

## 2014-10-10 MED ORDER — INSULIN ASPART PROT & ASPART (70-30 MIX) 100 UNIT/ML ~~LOC~~ SUSP: 30.0000 [IU] | Freq: Two times a day (BID) | SUBCUTANEOUS | Status: DC

## 2014-10-10 MED ORDER — INSULIN GLARGINE 100 UNIT/ML ~~LOC~~ SOLN: 55.0000 [IU] | Freq: Every day | SUBCUTANEOUS | Status: DC

## 2014-10-10 MED ORDER — INSULIN GLARGINE 100 UNIT/ML SOLOSTAR PEN: 55.0000 [IU] | PEN_INJECTOR | Freq: Every day | SUBCUTANEOUS | Status: DC

## 2014-10-10 NOTE — Progress Notes (Signed)
Patient here for follow up from hospitalization. Admitted February 8 & 18th. Patient hospitalized on the 18th for e. Coli in the kidney. Patient reports no pain currently. Patient states "I'm feeling good".  Patient needs lantus changed because he can't afford med.  Patient complains of "a lot of fluid on me", referring to legs.

## 2014-10-10 NOTE — Progress Notes (Signed)
Subjective:    Patient ID: Trevor Thompson, male    DOB: 12/28/1960, 54 y.o.   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 He was placed in a course of IV antibiotics and steroids and also supposedly managed by nephrology. He did have some hyperkalemia which has corrected with Kayexalate.  Since discharge he reports doing well and has been seen by his nephrologist and has had recent ultrasound of his kidney which showed no hydronephrosis and mild pelvocaliectasis in Trevor transplanted kidney.  Medical history is notable for uncontrolled diabetes mellitus. Disease Monitoring  Blood Sugar Ranges: 96 fasting and 150 random  Polyuria: no   Visual problems: no             Paresthesia yes   Medication Compliance: yes  Medication Side Effects No  Hypoglycemia: no   Preventitive Health Care  Eye Exam: - Opthalmologist is Dr Dione BoozeGroat whom he has not seen in 3 years and I have advised him to schedule an annual eye exam  Foot Exam: had it on 08/2014  Diet pattern: compliant with ADA diet  Exercise: not compliant             Pneumovax: up to date  Would like to change from Lantus to 70/30 due to cost  Past Medical History  Diagnosis Date  . Hypertension   . PVD (peripheral vascular disease)   . S/p cadaver renal transplant   . Cold intolerance   . 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 GERD (gastroesophageal reflux disease)   . 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 Hypertriglyceridemia   . Diabetic retinopathy   . Sleep apnea     in Trevor past .  Was cleared in 2004 to not wear CPap.  [place] in [place]  . High cholesterol   . Pneumonia 05/09/2013    "first time was today" (05/09/2013)  . Type II diabetes mellitus   . ESRD (end stage renal disease)     "no dialysis; had kidney transplant" (05/09/2013)     Past Surgical  History  Procedure Laterality Date  . Kidney transplant Right 2004  . Cataract extraction w/ intraocular lens  implant, bilateral Bilateral ~ 1997  . Av fistula placement  1997-2004    "I've got 3; right upper arm; right forearm; left forearm "  . Av fistula repair  1997-2004    "multiple"  . Amputation  02/25/2012    Procedure: AMPUTATION DIGIT;  Surgeon: Larina Earthlyodd F Early, MD;  [facility];  Service: Vascular;  Laterality: Left;  Left 2nd Toe Amputation, Incision and Drainage left lateral foot.  . Amputation  03/22/2012    Procedure: AMPUTATION BELOW KNEE;  Surgeon: Nadara MustardMarcus V Duda, MD;  Location: [facility] OR;  Service: Orthopedics;  Laterality: Left;  Left Below Knee Amputation  . Amputation  04/21/2012    Procedure: AMPUTATION BELOW KNEE;  Surgeon: Nadara MustardMarcus V Duda, MD;  Location: [facility] OR;  Service: Orthopedics;  Laterality: Left;  Left below knee amputation revision  . Abdominal aortagram N/A 02/22/2012    Procedure: ABDOMINAL Ronny FlurryAORTAGRAM;  Surgeon: Nada LibmanVance W Brabham, MD;  [facility];  Service: Cardiovascular;  Laterality: N/A;     History   Social History  . Marital Status: Married    Spouse Name: N/A  . Number of Children: N/A  . Years  of Education: N/A   Occupational History  . Not on file.   Social History Main Topics  . Smoking status: Former Smoker -- 0.30 packs/day for 3 years    Types: Cigarettes    Quit date: 07/05/1981  . Smokeless tobacco: Never Used  . Alcohol Use: 1.8 oz/week    3 Cans of beer per week     Comment: occasional  . Drug Use: No     Comment: 05/09/2013 "smoked pot in Trevor 1970's; tried it again ~ 2010; stopped again"  . Sexual Activity: Yes   Other Topics Concern  . Not on file   Social History Narrative   Pt is married and traveling thru [place]     Allergies  Allergen Reactions  . Penicillins Other (See Comments)    Since birth     Current Outpatient Prescriptions on File Prior to Visit  Medication Sig Dispense Refill  . metoprolol  tartrate (LOPRESSOR) 25 MG tablet Take 1 tablet (25 mg total) by mouth 2 (two) times daily. 60 tablet 2  . mycophenolate (MYFORTIC) 360 MG TBEC EC tablet Take 720 mg by mouth 2 (two) times daily. Take 2 Tablets (720 mg) By Mouth BID.    Marland Kitchen predniSONE (DELTASONE) 10 MG tablet Take 1 tablet (10 mg total) by mouth daily with breakfast. 15 tablet 0  . tacrolimus (PROGRAF) 1 MG capsule Take 4 mg by mouth 2 (two) times daily. Take 4 Tablets (8 mg) By Mouth BID.    Marland Kitchen ciprofloxacin (CIPRO) 500 MG tablet Take 1 tablet (500 mg total) by mouth 2 (two) times daily. (Patient not taking: Reported on 10/10/2014) 18 tablet 0   No current facility-administered medications on file prior to visit.      Review of Systems   General: negative for fever, weight loss, appetite change Eyes: no visual symptoms. ENT: no ear symptoms, no sinus tenderness, no nasal congestion or sore throat. Neck: no pain  Respiratory: no wheezing, shortness of breath, cough Cardiovascular: no chest pain, no dyspnea on exertion, no pedal edema, no orthopnea. Gastrointestinal: no abdominal pain, no diarrhea, no constipation Genito-Urinary: no urinary frequency, no dysuria, no polyuria. Hematologic: no bruising Endocrine: no cold or heat intolerance Neurological: no headaches, no seizures, no tremors Musculoskeletal: no joint pains, no joint swelling Skin: no pruritus, no rash. Psychological: no depression, no anxiety,        Objective:  Filed Vitals:   10/10/14 0914  BP: 139/95  Pulse: 100  Temp: 97.8 F (36.6 C)  Resp: 18      Physical Exam  Constitutional: obese Eyes: PERRLA HENT: Head is atraumatic, normal sinuses, normal oropharynx, normal appearing tonsils and palate Neck: normal range of motion, no thyromegaly, no JVD cardiovascular: normal rate and rhythm, normal heart sounds, no murmurs, rub or gallop, pedal edema 1+ on right ankle, absent dorsalis pedis on right. Respiratory: clear to auscultation  bilaterally, no wheezes, no rales, no rhonchi Abdomen: soft, not tender to palpation, normal bowel sounds, no enlarged organs Extremities: Full ROM, no tenderness in joint on Trevor right lowerextremity; left below knee amputation with prosthesis in place Skin: warm and dry, no lesions. Neurological: alert, oriented x3, cranial nerves I-XII grossly intact Psychological: normal mood.  CMP Latest Ref Rng 08/27/2014 08/26/2014 08/25/2014  Glucose 70 - 99 mg/dL 161(W) 960(A) 540(J)  BUN 6 - 23 mg/dL 81(X) 91(Y) 78(G)  Creatinine 0.50 - 1.35 mg/dL 9.56(O) 1.30(Q) 6.57(Q)  Sodium 135 - 145 mmol/L 135 134(L) 135  Potassium 3.5 - 5.1  mmol/L 5.2(H) 4.0 4.3  Chloride 96 - 112 mmol/L 107 106 109  CO2 19 - 32 mmol/L Calcium 8.4 - 10.5 mg/dL 8.7 8.5 8.8  Total Protein 6.0 - 8.3 g/dL - - -  Total Bilirubin 0.3 - 1.2 mg/dL - - -  Alkaline Phos 39 - 117 U/L - - -  AST 0 - 37 U/L - - -  ALT 0 - 53 U/L - - -    EXAM: RENAL/URINARY TRACT ULTRASOUND COMPLETE  COMPARISON: 08/23/2014.  FINDINGS: Transplant kidney:  Present in right lower pelvis. 13.9 cm in length. Normal corticomedullary differentiation. Mild pelvocaliectasis. No prominent hydronephrosis. Renal blood flow present. Doppler exam not performed.  Right Kidney:  Length: 5.2 cm. Echogenicity within normal limits. No mass or hydronephrosis visualized.  Left Kidney:  Length: 7.3 cm. Echogenicity within normal limits. No significant mass or hydronephrosis visualized. 1.6 cm simple cyst lower pole native left kidney.  Bladder:  Bladder appears normal. Prevoid volume 140 cc, postvoid volume 38 cc.  IMPRESSION: Transplant kidney demonstrates mild pelvocaliectasis. No prominent hydronephrosis. Exam otherwise unremarkable.           Assessment & Plan:  54 year old male with a history of right renal transplant currently on immunosuppressants, type 2 diabetes mellitus, hypertension here for follow-up from  hospitalization. He reports he couldn't come in soon as he had to care for his spouse who currently is receiving chemotherapy.  Acute on chronic renal failure: Status post renal transplant. Managed closely by nephrology. I will send off a basic metabolic panel to evaluate his renal function.  Diabetes mellitus: Uncontrolled with last A1c of 10.7 from 08/2014 however blood sugars at this time revealed that controlled is much better and so I will hold from changing his regimen. He had requested switching to NovoLog 70 30 mL thought this was cheaper than discovering that because Trevor same as Lantus he requested going back Lantus. Up-to-date on Pneumovax, foot exams; have advised him to make an appointment with his ophthalmologist and I am sending him for a microalbumin today.  Hypertension: Mild diastolic elevation, no change in regimen at this time lifestyle modification as well as low-sodium diet.  Obesity: His physical activity level is very low and I have encouraged him to increase his activity as tolerated as he could stand to lose a few more pounds.  Left BKA Currently wearing a prosthesis.

## 2014-10-10 NOTE — Progress Notes (Signed)
Quick Note:  Labs addressed at office as well as medications and patient was made aware. ______ 

## 2014-10-10 NOTE — Patient Instructions (Signed)
Obesity Obesity is defined as having too much total body fat and a body mass index (BMI) of 30 or more. BMI is an estimate of body fat and is calculated from your height and weight. Obesity happens when you consume more calories than you can burn by exercising or performing daily physical tasks. Prolonged obesity can cause major illnesses or emergencies, such as:   Stroke.  Heart disease.  Diabetes.  Cancer.  Arthritis.  High blood pressure (hypertension).  High cholesterol.  Sleep apnea.  Erectile dysfunction.  Infertility problems. CAUSES   Regularly eating unhealthy foods.  Physical inactivity.  Certain disorders, such as an underactive thyroid (hypothyroidism), Cushing's syndrome, and polycystic ovarian syndrome.  Certain medicines, such as steroids, some depression medicines, and antipsychotics.  Genetics.  Lack of sleep. DIAGNOSIS  A health care provider can diagnose obesity after calculating your BMI. Obesity will be diagnosed if your BMI is 30 or higher.  There are other methods of measuring obesity levels. Some other methods include measuring your skinfold thickness, your waist circumference, and comparing your hip circumference to your waist circumference. TREATMENT  A healthy treatment program includes some or all of the following:  Long-term dietary changes.  Exercise and physical activity.  Behavioral and lifestyle changes.  Medicine only under the supervision of your health care provider. Medicines may help, but only if they are used with diet and exercise programs. An unhealthy treatment program includes:  Fasting.  Fad diets.  Supplements and drugs. These choices do not succeed in long-term weight control.  HOME CARE INSTRUCTIONS   Exercise and perform physical activity as directed by your health care provider. To increase physical activity, try the following:  Use stairs instead of elevators.  Park farther away from store  entrances.  Garden, bike, or walk instead of watching television or using the computer.  Eat healthy, low-calorie foods and drinks on a regular basis. Eat more fruits and vegetables. Use low-calorie cookbooks or take healthy cooking classes.  Limit fast food, sweets, and processed snack foods.  Eat smaller portions.  Keep a daily journal of everything you eat. There are many free websites to help you with this. It may be helpful to measure your foods so you can determine if you are eating the correct portion sizes.  Avoid drinking alcohol. Drink more water and drinks without calories.  Take vitamins and supplements only as recommended by your health care provider.  Weight-loss support groups, Government social research officer, counselors, and stress reduction education can also be very helpful. SEEK IMMEDIATE MEDICAL CARE IF:  You have chest pain or tightness.  You have trouble breathing or feel short of breath.  You have weakness or leg numbness.  You feel confused or have trouble talking.  You have sudden changes in your vision. MAKE SURE YOU:  Understand these instructions.  Will watch your condition.  Will get help right away if you are not doing well or get worse. Document Released: 07/29/2004 Document Revised: 11/05/2013 Document Reviewed: 07/28/2011 [facility] Patient Information 2015 [place], [place]. This information is not intended to replace advice given to you by your health care provider. Make sure you discuss any questions you have with your health care provider. Diabetes and Exercise Exercising regularly is important. It is not just about losing weight. It has many health benefits, such as:  Improving your overall fitness, flexibility, and endurance.  Increasing your bone density.  Helping with weight control.  Decreasing your body fat.  Increasing your muscle strength.  Reducing stress and  tension.  Improving your overall health. People with diabetes who exercise  gain additional benefits because exercise:  Reduces appetite.  Improves the body's use of blood sugar (glucose).  Helps lower or control blood glucose.  Decreases blood pressure.  Helps control blood lipids (such as cholesterol and triglycerides).  Improves the body's use of the hormone insulin by:  Increasing the body's insulin sensitivity.  Reducing the body's insulin needs.  Decreases the risk for heart disease because exercising:  Lowers cholesterol and triglycerides levels.  Increases the levels of good cholesterol (such as high-density lipoproteins [HDL]) in the body.  Lowers blood glucose levels. YOUR ACTIVITY PLAN  Choose an activity that you enjoy and set realistic goals. Your health care provider or diabetes educator can help you make an activity plan that works for you. Exercise regularly as directed by your health care provider. This includes:  Performing resistance training twice a week such as push-ups, sit-ups, lifting weights, or using resistance bands.  Performing 150 minutes of cardio exercises each week such as walking, running, or playing sports.  Staying active and spending no more than 90 minutes at one time being inactive. Even short bursts of exercise are good for you. Three 10-minute sessions spread throughout the day are just as beneficial as a single 30-minute session. Some exercise ideas include:  Taking the dog for a walk.  Taking the stairs instead of the elevator.  Dancing to your favorite song.  Doing an exercise video.  Doing your favorite exercise with a friend. RECOMMENDATIONS FOR EXERCISING WITH TYPE 1 OR TYPE 2 DIABETES   Check your blood glucose before exercising. If blood glucose levels are greater than 240 mg/dL, check for urine ketones. Do not exercise if ketones are present.  Avoid injecting insulin into areas of the body that are going to be exercised. For example, avoid injecting insulin into:  The arms when playing  tennis.  The legs when jogging.  Keep a record of:  Food intake before and after you exercise.  Expected peak times of insulin action.  Blood glucose levels before and after you exercise.  The type and amount of exercise you have done.  Review your records with your health care provider. Your health care provider will help you to develop guidelines for adjusting food intake and insulin amounts before and after exercising.  If you take insulin or oral hypoglycemic agents, watch for signs and symptoms of hypoglycemia. They include:  Dizziness.  Shaking.  Sweating.  Chills.  Confusion.  Drink plenty of water while you exercise to prevent dehydration or heat stroke. Body water is lost during exercise and must be replaced.  Talk to your health care provider before starting an exercise program to make sure it is safe for you. Remember, almost any type of activity is better than none. Document Released: 09/11/2003 Document Revised: 11/05/2013 Document Reviewed: 11/28/2012 [facility] Patient Information 2015 [place]. This information is not intended to replace advice given to you by your health care provider. Make sure you discuss any questions you have with your health care provider.

## 2014-10-11 LAB — MICROALBUMIN / CREATININE URINE RATIO
CREATININE, URINE: 106.8 mg/dL
MICROALB/CREAT RATIO: 4050.6 mg/g — AB (ref 0.0–30.0)
Microalb, Ur: 432.6 mg/dL — ABNORMAL HIGH (ref <2.0)

## 2014-10-12 ENCOUNTER — Other Ambulatory Visit: Payer: Self-pay | Admitting: Family Medicine

## 2014-10-12 DIAGNOSIS — E785 Hyperlipidemia, unspecified: Secondary | ICD-10-CM

## 2014-10-12 DIAGNOSIS — E875 Hyperkalemia: Secondary | ICD-10-CM

## 2014-10-12 MED ORDER — ATORVASTATIN CALCIUM 20 MG PO TABS: 20.0000 mg | ORAL_TABLET | Freq: Every day | ORAL | Status: DC

## 2014-10-12 MED ORDER — SODIUM POLYSTYRENE SULFONATE PO POWD: Freq: Once | ORAL | Status: DC

## 2014-10-12 NOTE — Progress Notes (Signed)
Spoke to the patient and [facility] prefers [facility] [address] [facility].

## 2014-10-12 NOTE — Progress Notes (Signed)
Placed on Kayexalate for hyperkalemia and atorvastatin for Hyperlipidemia, repeat potassium level in 2 days.

## 2014-11-04 ENCOUNTER — Ambulatory Visit: Payer: Medicare Other

## 2014-11-07 ENCOUNTER — Ambulatory Visit: Payer: Medicare Other

## 2014-11-13 ENCOUNTER — Encounter: Payer: Self-pay | Admitting: *Deleted

## 2014-11-20 NOTE — Telephone Encounter (Signed)
Error

## 2014-11-22 ENCOUNTER — Ambulatory Visit (INDEPENDENT_AMBULATORY_CARE_PROVIDER_SITE_OTHER): Payer: Medicare Other

## 2014-11-22 DIAGNOSIS — E1159 Type 2 diabetes mellitus with other circulatory complications: Secondary | ICD-10-CM

## 2014-11-22 DIAGNOSIS — B351 Tinea unguium: Secondary | ICD-10-CM | POA: Diagnosis not present

## 2014-11-22 DIAGNOSIS — E1151 Type 2 diabetes mellitus with diabetic peripheral angiopathy without gangrene: Secondary | ICD-10-CM

## 2014-11-22 DIAGNOSIS — M79676 Pain in unspecified toe(s): Secondary | ICD-10-CM

## 2014-11-22 DIAGNOSIS — Q828 Other specified congenital malformations of skin: Secondary | ICD-10-CM | POA: Diagnosis not present

## 2014-11-22 NOTE — Progress Notes (Signed)
Patient ID: Trevor Thompson, male   DOB: August 07, 1960, 54 y.o.   MRN: [id number] Complaint:  Visit Type: Patient returns to my office for continued preventative foot care services. Complaint: Patient states" my nails have grown long and thick and become painful to walk and wear shoes" Patient has been diagnosed with DM with no complications. He presents for preventative foot care services. No changes to ROS  Podiatric Exam: Vascular: dorsalis pedis and posterior tibial pulses are palpable bilateral. Capillary return is immediate. Temperature gradient is WNL. Skin turgor WNL  Sensorium: Normal Semmes Weinstein monofilament test. Normal tactile sensation bilaterally. Nail Exam: Pt has thick disfigured discolored nails with subungual debris noted bilateral entire nail hallux through fifth toenails Ulcer Exam: There is no evidence of ulcer or pre-ulcerative changes or infection. Orthopedic Exam: Muscle tone and strength are WNL. No limitations in general ROM. No crepitus or effusions noted. Foot type and digits show no abnormalities. Bony prominences are unremarkable. 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 The patient understood the discussion of treatment and procedures well. All questions were answered thoroughly reviewed. Debridement of mycotic and hypertrophic toenails, 1 through 5 bilateral and clearing of subungual debris. No ulceration, no infection noted.  Return Visit-Office Procedure: Patient instructed to return to the office for a follow up visit 3 months for continued evaluation and treatment.

## 2014-12-04 ENCOUNTER — Telehealth: Payer: Self-pay | Admitting: Internal Medicine

## 2014-12-04 NOTE — Telephone Encounter (Signed)
Patient called requesting to speak to nurse regarding a medication refill fax that was sent over 11/27/14 and has been out of medication for 7 days . Please f/u with patient

## 2014-12-05 NOTE — Telephone Encounter (Signed)
Document completed; he will have to find out from his pharmacy what is preferred according to his insurance as that could be causing a delay.

## 2014-12-05 NOTE — Telephone Encounter (Signed)
Faxed completed document to [facility].

## 2014-12-06 ENCOUNTER — Telehealth: Payer: Self-pay | Admitting: Internal Medicine

## 2014-12-07 NOTE — Telephone Encounter (Signed)
Please find out what is preferred so we can make the switch.

## 2014-12-09 ENCOUNTER — Telehealth: Payer: Self-pay

## 2014-12-09 NOTE — Telephone Encounter (Signed)
Nurse called patient to make him aware of pharmacy contacting nurse about Lantus. Patient informed nurse of need for Lantus because it work the best. Patient also [facility] to see if it would be any cheaper. Dr. Venetia NightAmao talked to pharmacy about prescription . Nurse will call patient back with price of medication.

## 2014-12-09 NOTE — Telephone Encounter (Signed)
Nurse called patient, reached voicemail. Left message for patient to call Latecia Miler at [phone].   

## 2014-12-10 NOTE — Telephone Encounter (Signed)
Nurse called patient. [facility]. [facility] was approved for lower tier copay. [facility] are any issues with cop[facility] when Lantus is refilled.

## 2015-02-11 ENCOUNTER — Other Ambulatory Visit: Payer: Self-pay | Admitting: Urology

## 2015-02-11 DIAGNOSIS — N2889 Other specified disorders of kidney and ureter: Secondary | ICD-10-CM

## 2015-02-20 ENCOUNTER — Ambulatory Visit: Payer: Medicare Other | Admitting: Podiatry

## 2015-02-21 ENCOUNTER — Encounter: Payer: Self-pay | Admitting: Podiatry

## 2015-02-21 ENCOUNTER — Ambulatory Visit (INDEPENDENT_AMBULATORY_CARE_PROVIDER_SITE_OTHER): Payer: Medicare Other | Admitting: Podiatry

## 2015-02-21 VITALS — BP 111/76 | HR 70 | Resp 18

## 2015-02-21 DIAGNOSIS — E1151 Type 2 diabetes mellitus with diabetic peripheral angiopathy without gangrene: Secondary | ICD-10-CM

## 2015-02-21 DIAGNOSIS — M79676 Pain in unspecified toe(s): Secondary | ICD-10-CM | POA: Diagnosis not present

## 2015-02-21 DIAGNOSIS — B351 Tinea unguium: Secondary | ICD-10-CM

## 2015-02-21 DIAGNOSIS — M79674 Pain in right toe(s): Secondary | ICD-10-CM

## 2015-02-21 DIAGNOSIS — E1159 Type 2 diabetes mellitus with other circulatory complications: Secondary | ICD-10-CM

## 2015-02-21 NOTE — Progress Notes (Signed)
Patient ID: Trevor Thompson, male   DOB: 11-12-1960, 54 y.o.   MRN: [id number] Complaint:  Visit Type: Patient returns to my office for continued preventative foot care services. Complaint: Patient states" my nails have grown long and thick and become painful to walk and wear shoes" Patient has been diagnosed with DM with no complications. He presents for preventative foot care services. No changes to ROS  Podiatric Exam: Vascular: dorsalis pedis and posterior tibial pulses are palpable bilateral. Capillary return is immediate. Temperature gradient is WNL. Skin turgor WNL  Sensorium: Normal Semmes Weinstein monofilament test. Normal tactile sensation bilaterally. Nail Exam: Pt has thick disfigured discolored nails with subungual debris noted bilateral entire nail hallux through fifth toenails Ulcer Exam: There is no evidence of ulcer or pre-ulcerative changes or infection. Orthopedic Exam: Muscle tone and strength are WNL. No limitations in general ROM. No crepitus or effusions noted. Foot type and digits show no abnormalities. Bony prominences are unremarkable. Mild dorsomedial exostosis 1st MPJ right foot. 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 The patient understood the discussion of treatment and procedures well. All questions were answered thoroughly reviewed. Debridement of mycotic and hypertrophic toenails, 1 through 5 bilateral and clearing of subungual debris. No ulceration, no infection noted.  Return Visit-Office Procedure: Patient instructed to return to the office for a follow up visit 3 months for continued evaluation and treatment.

## 2015-03-06 ENCOUNTER — Ambulatory Visit (HOSPITAL_COMMUNITY)
Admission: RE | Admit: 2015-03-06 | Discharge: 2015-03-06 | Disposition: A | Discharge status: Home / Self Care | Payer: Medicare Other | Source: Ambulatory Visit | Attending: Urology | Admitting: Urology

## 2015-03-06 DIAGNOSIS — N2889 Other specified disorders of kidney and ureter: Secondary | ICD-10-CM | POA: Insufficient documentation

## 2015-03-06 DIAGNOSIS — K8020 Calculus of gallbladder without cholecystitis without obstruction: Secondary | ICD-10-CM | POA: Insufficient documentation

## 2015-03-06 LAB — POCT I-STAT CREATININE: CREATININE: 2.2 mg/dL — AB (ref 0.61–1.24)

## 2015-03-06 MED ORDER — GADOBENATE DIMEGLUMINE 529 MG/ML IV SOLN
10.0000 mL | Freq: Once | INTRAVENOUS | Status: AC | PRN
  Administered 2015-03-06: 10 mL via INTRAVENOUS

## 2015-03-26 ENCOUNTER — Other Ambulatory Visit: Payer: Self-pay | Admitting: Internal Medicine

## 2015-03-26 NOTE — Telephone Encounter (Signed)
Patient called requesting a med refill on LANTUS. Please f/u with pt.

## 2015-03-31 ENCOUNTER — Other Ambulatory Visit: Payer: Self-pay

## 2015-03-31 DIAGNOSIS — Z794 Long term (current) use of insulin: Principal | ICD-10-CM

## 2015-03-31 DIAGNOSIS — E1165 Type 2 diabetes mellitus with hyperglycemia: Secondary | ICD-10-CM

## 2015-03-31 DIAGNOSIS — IMO0002 Reserved for concepts with insufficient information to code with codable children: Secondary | ICD-10-CM

## 2015-03-31 MED ORDER — INSULIN GLARGINE 100 UNIT/ML SOLOSTAR PEN: 55.0000 [IU] | PEN_INJECTOR | Freq: Every day | SUBCUTANEOUS | Status: DC

## 2015-03-31 NOTE — Telephone Encounter (Signed)
Patient requesting Lantus to be refilled.  Nurse will refill Lantus, patient needs appointment with PCP to get additional refills.  Nurse called patient to make him aware of need for appointment with a PCP at [facility]. Nurse called patient, reached voicemail. Left message for patient to call Heather with [facility], at [phone].

## 2015-03-31 NOTE — Telephone Encounter (Signed)
Patient called to request a med refill for LANTUS. Please f/u with pt.

## 2015-04-01 ENCOUNTER — Other Ambulatory Visit: Payer: Self-pay | Admitting: *Deleted

## 2015-04-17 ENCOUNTER — Ambulatory Visit: Payer: Medicare Other | Admitting: Family Medicine

## 2015-04-25 ENCOUNTER — Encounter: Payer: Self-pay | Admitting: Family Medicine

## 2015-04-25 ENCOUNTER — Ambulatory Visit: Payer: Medicare Other | Attending: Family Medicine | Admitting: Family Medicine

## 2015-04-25 VITALS — BP 109/56 | HR 103 | Temp 97.7°F | Resp 16 | Ht 71.0 in | Wt 254.0 lb

## 2015-04-25 DIAGNOSIS — Z87891 Personal history of nicotine dependence: Secondary | ICD-10-CM | POA: Diagnosis not present

## 2015-04-25 DIAGNOSIS — N184 Chronic kidney disease, stage 4 (severe): Secondary | ICD-10-CM

## 2015-04-25 DIAGNOSIS — Z0000 Encounter for general adult medical examination without abnormal findings: Secondary | ICD-10-CM

## 2015-04-25 DIAGNOSIS — K529 Noninfective gastroenteritis and colitis, unspecified: Secondary | ICD-10-CM | POA: Diagnosis not present

## 2015-04-25 DIAGNOSIS — E781 Pure hyperglyceridemia: Secondary | ICD-10-CM

## 2015-04-25 DIAGNOSIS — E1122 Type 2 diabetes mellitus with diabetic chronic kidney disease: Secondary | ICD-10-CM | POA: Insufficient documentation

## 2015-04-25 DIAGNOSIS — L97921 Non-pressure chronic ulcer of unspecified part of left lower leg limited to breakdown of skin: Secondary | ICD-10-CM

## 2015-04-25 DIAGNOSIS — R197 Diarrhea, unspecified: Secondary | ICD-10-CM | POA: Diagnosis not present

## 2015-04-25 DIAGNOSIS — I872 Venous insufficiency (chronic) (peripheral): Secondary | ICD-10-CM | POA: Diagnosis not present

## 2015-04-25 DIAGNOSIS — E119 Type 2 diabetes mellitus without complications: Secondary | ICD-10-CM | POA: Diagnosis present

## 2015-04-25 DIAGNOSIS — E1159 Type 2 diabetes mellitus with other circulatory complications: Secondary | ICD-10-CM | POA: Diagnosis not present

## 2015-04-25 DIAGNOSIS — Z794 Long term (current) use of insulin: Secondary | ICD-10-CM | POA: Diagnosis not present

## 2015-04-25 DIAGNOSIS — Z23 Encounter for immunization: Secondary | ICD-10-CM | POA: Diagnosis not present

## 2015-04-25 DIAGNOSIS — I739 Peripheral vascular disease, unspecified: Secondary | ICD-10-CM | POA: Diagnosis not present

## 2015-04-25 DIAGNOSIS — Z79899 Other long term (current) drug therapy: Secondary | ICD-10-CM | POA: Diagnosis not present

## 2015-04-25 DIAGNOSIS — Z1159 Encounter for screening for other viral diseases: Secondary | ICD-10-CM

## 2015-04-25 DIAGNOSIS — L97929 Non-pressure chronic ulcer of unspecified part of left lower leg with unspecified severity: Secondary | ICD-10-CM | POA: Insufficient documentation

## 2015-04-25 LAB — GLUCOSE, POCT (MANUAL RESULT ENTRY): POC GLUCOSE: 84 mg/dL (ref 70–99)

## 2015-04-25 LAB — POCT GLYCOSYLATED HEMOGLOBIN (HGB A1C): HEMOGLOBIN A1C: 5.6

## 2015-04-25 MED ORDER — ATORVASTATIN CALCIUM 20 MG PO TABS: 20.0000 mg | ORAL_TABLET | Freq: Every day | ORAL | Status: DC

## 2015-04-25 MED ORDER — INSULIN GLARGINE 100 UNIT/ML SOLOSTAR PEN: 50.0000 [IU] | PEN_INJECTOR | Freq: Every day | SUBCUTANEOUS | Status: DC

## 2015-04-25 MED ORDER — GLUCOSE BLOOD VI STRP: 1.0000 | ORAL_STRIP | Freq: Three times a day (TID) | Status: DC

## 2015-04-25 MED ORDER — ACCU-CHEK SOFTCLIX LANCETS MISC: 1.0000 | Freq: Three times a day (TID) | Status: DC

## 2015-04-25 MED ORDER — ACCU-CHEK AVIVA PLUS W/DEVICE KIT: 1.0000 | PACK | Freq: Three times a day (TID) | Status: DC

## 2015-04-25 NOTE — Assessment & Plan Note (Signed)
A: venous stasis of R leg in setting of CKD and known PAD P: elevation Compression stocking, limited pressure to 15-20 mm Hg

## 2015-04-25 NOTE — Progress Notes (Signed)
F/U DM  Glucose running between 90-100 Sore on lt leg  No pain  No hx tobacco

## 2015-04-25 NOTE — Assessment & Plan Note (Signed)
A: controlled diabetes, suspect symptomatic hypoglycemia P: Decrease lantus negotiated with patient who agreed to 50 U down from 55 U with plan to decrease further as needed

## 2015-04-25 NOTE — Assessment & Plan Note (Signed)
A: small punched out left leg ulcer. Suspect due to pressure for prosthesis. Patient is a known vasculopath. There is no evidence of infection  P:  duoderm coverage Close f/u  Low threshold for wound care referral

## 2015-04-25 NOTE — Progress Notes (Addendum)
Patient ID: Trevor Thompson, male   DOB: 1961/03/04, 54 y.o.   MRN: [id number]   Subjective:  Patient ID: Trevor Thompson, male    DOB: [birth year]  Age: 54 y.o. MRN: [id number]  CC: Diabetes   HPI Trevor Thompson presents for   1. CHRONIC DIABETES  Disease Monitoring  Blood Sugar Ranges: 90-100  Polyuria: no   Visual problems: no   Medication Compliance: yes  Medication Side Effects  Hypoglycemia: symptoms of low sugars with no documented lows. Feeling dizzy, lightheaded, pre-syncopal and sweaty at times.    Preventitive Health Care  Eye Exam: due, sees Dr. Laury Axon Exam: done today also followed at podiatry   Diet pattern: regular meals   Exercise: minimal  2. Sore of L leg: L medial distal calf ulcer painful. No drainage.  X 2 weeks. 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 Had prosthesis adjusted one week ago. Area of ulceration is enlarging. He has self treated with tegaderm.   3. CKD: followed by  Dr. Florene Glen. Compliant and anti-rejection meds, prinzide and lasix 40 mg. Not taking beta blocker. Last had labs about 2-3 months ago.   4. Chronic diarrhea: for 2 years. Taking prograf and myfortic since kidney transplant. Has frequent non-bloody and watery/soft stools. No fever, abdominal pain. Even when he eats small meals he gets diarrhea. He has not had screening colonoscopy.    Past Surgical History  Procedure Laterality Date  . Kidney transplant Right 2004  . Cataract extraction w/ intraocular lens  implant, bilateral Bilateral ~ 1997  . Av fistula placement  1997-2004    "I've got 3; right upper arm; right forearm; left forearm "  . Av fistula repair  1997-2004    "multiple"  . Amputation  02/25/2012    Procedure: AMPUTATION DIGIT;  Surgeon: Rosetta Posner, MD;  Location: [facility] OR;  Service: Vascular;  Laterality: Left;  Left 2nd Toe Amputation, Incision and Drainage left lateral foot.  . Amputation  03/22/2012    Procedure: AMPUTATION BELOW KNEE;  Surgeon: Newt Minion, MD;   Location: [facility];  Service: Orthopedics;  Laterality: Left;  Left Below Knee Amputation  . Amputation  04/21/2012    Procedure: AMPUTATION BELOW KNEE;  Surgeon: Newt Minion, MD;  Location: [place];  Service: Orthopedics;  Laterality: Left;  Left below knee amputation revision  . Abdominal aortagram N/A 02/22/2012    Procedure: ABDOMINAL Maxcine Ham;  Surgeon: Serafina Mitchell, MD;  Location: [facility] CATH LAB;  Service: Cardiovascular;  Laterality: N/A;     Social History  Substance Use Topics  . Smoking status: Former Smoker -- 0.30 packs/day for 3 years    Types: Cigarettes    Quit date: 07/05/1981  . Smokeless tobacco: Never Used  . Alcohol Use: 1.8 oz/week    3 Cans of beer per week     Comment: occasional    Outpatient Prescriptions Prior to Visit  Medication Sig Dispense Refill  . atorvastatin (LIPITOR) 20 MG tablet Take 1 tablet (20 mg total) by mouth daily. (Patient not taking: Reported on 04/25/2015) 30 tablet 2  . ciprofloxacin (CIPRO) 500 MG tablet Take 1 tablet (500 mg total) by mouth 2 (two) times daily. (Patient not taking: Reported on 04/25/2015) 18 tablet 0  . furosemide (LASIX) 40 MG tablet Take 40 mg by mouth daily.  0  . furosemide (LASIX) 80 MG tablet Take 80 mg by mouth daily.  0  . Insulin Glargine (LANTUS SOLOSTAR) 100  UNIT/ML Solostar Pen Inject 55 Units into the skin daily at 10 pm. 5 pen 0  . lisinopril-hydrochlorothiazide (PRINZIDE,ZESTORETIC) 10-12.5 MG per tablet daily.   0  . metoprolol tartrate (LOPRESSOR) 25 MG tablet Take 1 tablet (25 mg total) by mouth 2 (two) times daily. 60 tablet 2  . mycophenolate (MYFORTIC) 360 MG TBEC EC tablet Take 720 mg by mouth 2 (two) times daily. Take 2 Tablets (720 mg) By Mouth BID.    Marland Kitchen ONE TOUCH ULTRA TEST test strip   0  . predniSONE (DELTASONE) 5 MG tablet Take 5 mg by mouth daily with breakfast.    . tacrolimus (PROGRAF) 1 MG capsule Take 4 mg by mouth 2 (two) times daily. Take 4 Tablets (8 mg) By Mouth BID.     No  facility-administered medications prior to visit.    ROS Review of Systems  Constitutional: Negative for fever, chills, fatigue and unexpected weight change.  Eyes: Negative for visual disturbance.  Respiratory: Negative for cough and shortness of breath.   Cardiovascular: Negative for chest pain, palpitations and leg swelling.  Gastrointestinal: Positive for diarrhea. Negative for nausea, vomiting, abdominal pain, constipation, blood in stool, abdominal distention, anal bleeding and rectal pain.  Endocrine: Negative for polydipsia, polyphagia and polyuria.  Musculoskeletal: Negative for myalgias, back pain, arthralgias, gait problem and neck pain.  Skin: Positive for wound. Negative for rash.  Allergic/Immunologic: Negative for immunocompromised state.  Neurological: Positive for dizziness, weakness and light-headedness. Negative for tremors, seizures, syncope, facial asymmetry, speech difficulty, numbness and headaches.  Hematological: Negative for adenopathy. Does not bruise/bleed easily.  Psychiatric/Behavioral: Negative for suicidal ideas, sleep disturbance and dysphoric mood. The patient is not nervous/anxious.   GAD-7: score of 2. 1-3,6.   Objective:  BP 109/56 mmHg  Pulse 103  Temp(Src) 97.7 F (36.5 C) (Oral)  Resp 16  Ht 5' 11" (1.803 m)  Wt 254 lb (115.214 kg)  BMI 35.44 kg/m2  SpO2 97%  BP/Weight 04/25/2015 03/06/2015 6/75/4492  Systolic BP 010 - 071  Diastolic BP 56 - 76  Wt. (Lbs) 254 245 -  BMI 35.44 34.19 -   Physical Exam  Constitutional: He appears well-developed and well-nourished. No distress.  HENT:  Head: Normocephalic and atraumatic.  Neck: Normal range of motion. Neck supple.  Cardiovascular: Normal rate, regular rhythm, normal heart sounds and intact distal pulses.   Pulses:      Dorsalis pedis pulses are 1+ on the right side. Left dorsalis pedis pulse not accessible.       Posterior tibial pulses are 1+ on the right side. Left posterior tibial pulse  not accessible.  Pulmonary/Chest: Effort normal and breath sounds normal.  Musculoskeletal: He exhibits edema (2+ peripheral edema R lower leg ).  S/p L BKA   Neurological: He is alert.  Skin: Skin is warm and dry. No rash noted. No erythema.     Psychiatric: He has a normal mood and affect.   Lab Results  Component Value Date   HGBA1C 5.60 04/25/2015   CBG 84  Assessment & Plan:   Problem List Items Addressed This Visit    Chronic diarrhea    GIreferral for chronic diarrhea  I suspect this is due to your anti-rejection meds Add fiber supplement to help bulk stools like metamucil with meals       Relevant Orders   Ambulatory referral to Gastroenterology   COMPLETE METABOLIC PANEL WITH GFR   CBC   Chronic kidney disease (CKD), stage IV (severe) (HCC) (Chronic)  Followed closely by nephrologist Dr. Florene Glen       Relevant Orders   COMPLETE METABOLIC PANEL WITH GFR   CBC   Diabetes type 2, controlled (HCC) - Primary (Chronic)    A: controlled diabetes, suspect symptomatic hypoglycemia P: Decrease lantus negotiated with patient who agreed to 50 U down from 55 U with plan to decrease further as needed        Relevant Medications   Insulin Glargine (LANTUS SOLOSTAR) 100 UNIT/ML Solostar Pen   atorvastatin (LIPITOR) 20 MG tablet   ACCU-CHEK SOFTCLIX LANCETS lancets   Blood Glucose Monitoring Suppl (ACCU-CHEK AVIVA PLUS) W/DEVICE KIT   glucose blood (ACCU-CHEK AVIVA PLUS) test strip   Other Relevant Orders   POCT glycosylated hemoglobin (Hb A1C) (Completed)   POCT glucose (manual entry) (Completed)   COMPLETE METABOLIC PANEL WITH GFR   Ambulatory referral to Ophthalmology   Hypertriglyceridemia   Relevant Medications   atorvastatin (LIPITOR) 20 MG tablet   PVD (peripheral vascular disease) (HCC) (Chronic)   Relevant Medications   atorvastatin (LIPITOR) 20 MG tablet   Venous insufficiency of right lower extremity    A: venous stasis of R leg in setting of CKD and  known PAD P: elevation Compression stocking, limited pressure to 15-20 mm Hg       Relevant Medications   atorvastatin (LIPITOR) 20 MG tablet    Other Visit Diagnoses    Healthcare maintenance        Relevant Orders    Flu Vaccine QUAD 36+ mos IM (Completed)    Need for hepatitis C screening test        Relevant Orders    Hepatitis C antibody, reflex       No orders of the defined types were placed in this encounter.    Follow-up: No Follow-up on file.   Boykin Nearing MD

## 2015-04-25 NOTE — Assessment & Plan Note (Signed)
GIreferral for chronic diarrhea  I suspect this is due to your anti-rejection meds Add fiber supplement to help bulk stools like metamucil with meals

## 2015-04-25 NOTE — Patient Instructions (Signed)
Trevor Thompson was seen today for diabetes.  Diagnoses and all orders for this visit:  Controlled type 2 diabetes mellitus with other circulatory complication, with long-term current use of insulin (HCC) -     POCT glycosylated hemoglobin (Hb A1C) -     POCT glucose (manual entry) -     Insulin Glargine (LANTUS SOLOSTAR) 100 UNIT/ML Solostar Pen; Inject 50 Units into the skin daily at 10 pm. -     COMPLETE METABOLIC PANEL WITH GFR -     CBC -     ACCU-CHEK SOFTCLIX LANCETS lancets; 1 each by Other route 3 (three) times daily. -     Blood Glucose Monitoring Suppl (ACCU-CHEK AVIVA PLUS) W/DEVICE KIT; 1 Device by Does not apply route 3 (three) times daily after meals. -     glucose blood (ACCU-CHEK AVIVA PLUS) test strip; 1 each by Other route 3 (three) times daily. E11.9  Chronic kidney disease (CKD), stage IV (severe) (HCC)  Chronic diarrhea -     Ambulatory referral to Gastroenterology -     COMPLETE METABOLIC PANEL WITH GFR -     CBC  Hypertriglyceridemia -     atorvastatin (LIPITOR) 20 MG tablet; Take 1 tablet (20 mg total) by mouth daily.  Healthcare maintenance -     Flu Vaccine QUAD 36+ mos IM  Other orders -     Cancel: Insulin Glargine (LANTUS SOLOSTAR) 100 UNIT/ML Solostar Pen; Inject 50 Units into the skin daily at 10 pm.   Gi referral for chronic diarrhea  I suspect this is due to your anti-rejection meds Add fiber supplement to help bulk stools like metamucil with meals   Great job with A1c  F/u with me in 4 weeks to check left leg sore F/u in 3 months for diabetes  Dr. Adrian Blackwater

## 2015-04-25 NOTE — Assessment & Plan Note (Signed)
Followed closely by nephrologist Dr. Lowell GuitarPowell

## 2015-04-26 LAB — CBC
HCT: 37.5 % — ABNORMAL LOW (ref 39.0–52.0)
HEMOGLOBIN: 11.5 g/dL — AB (ref 13.0–17.0)
MCH: 26.7 pg (ref 26.0–34.0)
MCHC: 30.7 g/dL (ref 30.0–36.0)
MCV: 87.2 fL (ref 78.0–100.0)
MPV: 12.3 fL (ref 8.6–12.4)
PLATELETS: 188 10*3/uL (ref 150–400)
RBC: 4.3 MIL/uL (ref 4.22–5.81)
RDW: 17 % — ABNORMAL HIGH (ref 11.5–15.5)
WBC: 10.7 10*3/uL — ABNORMAL HIGH (ref 4.0–10.5)

## 2015-04-26 LAB — COMPLETE METABOLIC PANEL WITH GFR
ALT: 16 U/L (ref 9–46)
AST: 18 U/L (ref 10–35)
Albumin: 3.1 g/dL — ABNORMAL LOW (ref 3.6–5.1)
Alkaline Phosphatase: 66 U/L (ref 40–115)
BILIRUBIN TOTAL: 0.3 mg/dL (ref 0.2–1.2)
BUN: 55 mg/dL — ABNORMAL HIGH (ref 7–25)
CO2: 21 mmol/L (ref 20–31)
Calcium: 9.1 mg/dL (ref 8.6–10.3)
Chloride: 112 mmol/L — ABNORMAL HIGH (ref 98–110)
Creat: 2.75 mg/dL — ABNORMAL HIGH (ref 0.70–1.33)
GFR, EST AFRICAN AMERICAN: 29 mL/min — AB (ref >=60)
GFR, EST NON AFRICAN AMERICAN: 25 mL/min — AB (ref >=60)
Glucose, Bld: 36 mg/dL — CL (ref 65–99)
POTASSIUM: 5.1 mmol/L (ref 3.5–5.3)
Sodium: 142 mmol/L (ref 135–146)
TOTAL PROTEIN: 6.4 g/dL (ref 6.1–8.1)

## 2015-04-29 ENCOUNTER — Telehealth: Payer: Self-pay | Admitting: Family Medicine

## 2015-04-29 NOTE — Telephone Encounter (Signed)
Date of birth verified by pt Lab results given, still anemic  Stated do not have GI appointment yet and is been seen by Nephrologist  Advised to stay away for NSAIDS Pt verbalized understanding

## 2015-04-29 NOTE — Telephone Encounter (Signed)
-----   Message from Jaclyn ShaggyEnobong Amao, MD sent at 04/28/2015  8:48 AM EDT ----- He is still anemic, seems Dr Armen PickupFunches placed a GI referral for him. Renal function is worsening; please inquire if he is seeing a Nephrologist. He needs to stay away from NSAIDS.

## 2015-04-29 NOTE — Telephone Encounter (Signed)
Patient came into facility to check on the status of the prior authorization for his test strips and meter for his insurance. Please f/u

## 2015-05-01 ENCOUNTER — Encounter: Payer: Self-pay | Admitting: Gastroenterology

## 2015-05-16 ENCOUNTER — Ambulatory Visit: Payer: Medicare Other | Admitting: Gastroenterology

## 2015-05-21 ENCOUNTER — Other Ambulatory Visit: Payer: Self-pay | Admitting: Family Medicine

## 2015-05-21 DIAGNOSIS — E1159 Type 2 diabetes mellitus with other circulatory complications: Secondary | ICD-10-CM

## 2015-05-21 DIAGNOSIS — Z794 Long term (current) use of insulin: Principal | ICD-10-CM

## 2015-05-21 MED ORDER — INSULIN GLARGINE 100 UNIT/ML SOLOSTAR PEN: 50.0000 [IU] | PEN_INJECTOR | Freq: Every day | SUBCUTANEOUS | Status: DC

## 2015-05-22 ENCOUNTER — Ambulatory Visit (INDEPENDENT_AMBULATORY_CARE_PROVIDER_SITE_OTHER): Payer: Medicare Other | Admitting: Podiatry

## 2015-05-22 DIAGNOSIS — E11621 Type 2 diabetes mellitus with foot ulcer: Secondary | ICD-10-CM | POA: Diagnosis not present

## 2015-05-22 DIAGNOSIS — L89891 Pressure ulcer of other site, stage 1: Secondary | ICD-10-CM

## 2015-05-22 DIAGNOSIS — B351 Tinea unguium: Secondary | ICD-10-CM

## 2015-05-22 DIAGNOSIS — M79676 Pain in unspecified toe(s): Secondary | ICD-10-CM

## 2015-05-22 DIAGNOSIS — M79674 Pain in right toe(s): Secondary | ICD-10-CM

## 2015-05-22 DIAGNOSIS — L97519 Non-pressure chronic ulcer of other part of right foot with unspecified severity: Secondary | ICD-10-CM

## 2015-05-22 DIAGNOSIS — E1151 Type 2 diabetes mellitus with diabetic peripheral angiopathy without gangrene: Secondary | ICD-10-CM

## 2015-05-22 NOTE — Progress Notes (Signed)
Subjective:     Patient ID: Trevor Thompson, male   DOB: 08/30/1960, 54 y.o.   MRN: [id number]  HPI this patient returns to the office for his scheduled nail care. 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 He presents with a hammertoe second right foot that has a breakdown of the skin over the top of the toe. There is peeling noted at the toe and no pain noted. No drainage. No blood, no blister was even noted. It has been present for 3 days and he presents to the office today for treatment of this.during his nail care.   Review of Systems     Objective:   Physical Exam GENERAL APPEARANCE: Alert, conversant. Appropriately groomed. No acute distress.  VASCULAR: Pedal pulses are not  palpable at  [facility] right foot.  Capillary refill time is immediate to all digits,  Cold left foot.  NEUROLOGIC: sensation is absent  to 5.07 monofilament at 5/5 sites bilateral.    MUSCULOSKELETAL: acceptable muscle strength, tone and stability bilateral.  Intrinsic muscluature intact bilateral.  Rectus appearance of foot and digits noted bilateral. HAV right with hammer toe second right foot.  DERMATOLOGIC: skin color, texture, and turgor are within normal limits.  No preulcerative lesions or ulcers  are seen, no interdigital maceration noted.  No open lesions present.  Digital nails are asymptomatic. No drainage noted. Skin breakdown over PIPJ second toe right foot.      Assessment:     Onychomycosis  Diabetic ulcer second toe right foot.     Plan:     Debridement of Nails.  Debride ulcer witn neosporin/DSD.  Initiate diabeticshoe paperwork.  RTC 3 months.  Home instructions.  If this condition worsens or becomes very painful, the patient was told to contact this office or go to the Emergency Department at the hospital.  Helane GuntherGregory Ruvim Risko DPM

## 2015-05-27 ENCOUNTER — Encounter (HOSPITAL_BASED_OUTPATIENT_CLINIC_OR_DEPARTMENT_OTHER): Payer: Medicare Other | Attending: General Surgery

## 2015-05-27 DIAGNOSIS — E11621 Type 2 diabetes mellitus with foot ulcer: Secondary | ICD-10-CM | POA: Diagnosis not present

## 2015-05-27 DIAGNOSIS — Z794 Long term (current) use of insulin: Secondary | ICD-10-CM | POA: Diagnosis not present

## 2015-05-27 DIAGNOSIS — L97511 Non-pressure chronic ulcer of other part of right foot limited to breakdown of skin: Secondary | ICD-10-CM | POA: Insufficient documentation

## 2015-05-27 DIAGNOSIS — Z940 Kidney transplant status: Secondary | ICD-10-CM | POA: Diagnosis not present

## 2015-05-27 DIAGNOSIS — Z87891 Personal history of nicotine dependence: Secondary | ICD-10-CM | POA: Diagnosis not present

## 2015-05-27 DIAGNOSIS — E1136 Type 2 diabetes mellitus with diabetic cataract: Secondary | ICD-10-CM | POA: Diagnosis not present

## 2015-05-27 DIAGNOSIS — N186 End stage renal disease: Secondary | ICD-10-CM | POA: Insufficient documentation

## 2015-05-27 DIAGNOSIS — Z89512 Acquired absence of left leg below knee: Secondary | ICD-10-CM | POA: Diagnosis not present

## 2015-05-27 DIAGNOSIS — E1122 Type 2 diabetes mellitus with diabetic chronic kidney disease: Secondary | ICD-10-CM | POA: Diagnosis not present

## 2015-05-27 DIAGNOSIS — I120 Hypertensive chronic kidney disease with stage 5 chronic kidney disease or end stage renal disease: Secondary | ICD-10-CM | POA: Insufficient documentation

## 2015-05-27 LAB — GLUCOSE, CAPILLARY: GLUCOSE-CAPILLARY: 97 mg/dL (ref 65–99)

## 2015-06-02 ENCOUNTER — Other Ambulatory Visit (HOSPITAL_BASED_OUTPATIENT_CLINIC_OR_DEPARTMENT_OTHER): Payer: Self-pay | Admitting: General Surgery

## 2015-06-02 ENCOUNTER — Ambulatory Visit (HOSPITAL_COMMUNITY)
Admission: RE | Admit: 2015-06-02 | Discharge: 2015-06-02 | Disposition: A | Discharge status: Home / Self Care | Payer: Medicare Other | Source: Ambulatory Visit | Attending: General Surgery | Admitting: General Surgery

## 2015-06-02 ENCOUNTER — Encounter (HOSPITAL_COMMUNITY): Payer: Medicare Other

## 2015-06-02 DIAGNOSIS — M869 Osteomyelitis, unspecified: Secondary | ICD-10-CM

## 2015-06-02 DIAGNOSIS — I739 Peripheral vascular disease, unspecified: Secondary | ICD-10-CM | POA: Insufficient documentation

## 2015-06-02 DIAGNOSIS — M25871 Other specified joint disorders, right ankle and foot: Secondary | ICD-10-CM | POA: Diagnosis not present

## 2015-06-03 DIAGNOSIS — Z89512 Acquired absence of left leg below knee: Secondary | ICD-10-CM | POA: Diagnosis not present

## 2015-06-03 DIAGNOSIS — Z87891 Personal history of nicotine dependence: Secondary | ICD-10-CM | POA: Diagnosis not present

## 2015-06-03 DIAGNOSIS — E11621 Type 2 diabetes mellitus with foot ulcer: Secondary | ICD-10-CM | POA: Diagnosis not present

## 2015-06-03 DIAGNOSIS — L97511 Non-pressure chronic ulcer of other part of right foot limited to breakdown of skin: Secondary | ICD-10-CM | POA: Diagnosis not present

## 2015-06-04 ENCOUNTER — Ambulatory Visit (HOSPITAL_COMMUNITY)
Admission: RE | Admit: 2015-06-04 | Discharge: 2015-06-04 | Disposition: A | Discharge status: Home / Self Care | Payer: Medicare Other | Source: Ambulatory Visit | Attending: Vascular Surgery | Admitting: Vascular Surgery

## 2015-06-04 ENCOUNTER — Other Ambulatory Visit (HOSPITAL_BASED_OUTPATIENT_CLINIC_OR_DEPARTMENT_OTHER): Payer: Self-pay | Admitting: General Surgery

## 2015-06-04 DIAGNOSIS — L97519 Non-pressure chronic ulcer of other part of right foot with unspecified severity: Secondary | ICD-10-CM

## 2015-06-10 ENCOUNTER — Encounter (HOSPITAL_BASED_OUTPATIENT_CLINIC_OR_DEPARTMENT_OTHER): Payer: Medicare Other | Attending: General Surgery

## 2015-06-10 DIAGNOSIS — E119 Type 2 diabetes mellitus without complications: Secondary | ICD-10-CM | POA: Insufficient documentation

## 2015-06-10 DIAGNOSIS — Z89421 Acquired absence of other right toe(s): Secondary | ICD-10-CM | POA: Insufficient documentation

## 2015-06-10 DIAGNOSIS — Z89512 Acquired absence of left leg below knee: Secondary | ICD-10-CM | POA: Insufficient documentation

## 2015-06-10 DIAGNOSIS — Y838 Other surgical procedures as the cause of abnormal reaction of the patient, or of later complication, without mention of misadventure at the time of the procedure: Secondary | ICD-10-CM | POA: Insufficient documentation

## 2015-06-10 DIAGNOSIS — Z940 Kidney transplant status: Secondary | ICD-10-CM | POA: Insufficient documentation

## 2015-06-10 DIAGNOSIS — S91104A Unspecified open wound of right lesser toe(s) without damage to nail, initial encounter: Secondary | ICD-10-CM | POA: Insufficient documentation

## 2015-06-10 DIAGNOSIS — I10 Essential (primary) hypertension: Secondary | ICD-10-CM | POA: Insufficient documentation

## 2015-06-10 DIAGNOSIS — Z87891 Personal history of nicotine dependence: Secondary | ICD-10-CM | POA: Insufficient documentation

## 2015-07-02 ENCOUNTER — Ambulatory Visit: Payer: Medicare Other

## 2015-07-02 DIAGNOSIS — Z87891 Personal history of nicotine dependence: Secondary | ICD-10-CM | POA: Diagnosis not present

## 2015-07-02 DIAGNOSIS — I10 Essential (primary) hypertension: Secondary | ICD-10-CM | POA: Diagnosis not present

## 2015-07-02 DIAGNOSIS — Z89512 Acquired absence of left leg below knee: Secondary | ICD-10-CM | POA: Diagnosis not present

## 2015-07-02 DIAGNOSIS — E119 Type 2 diabetes mellitus without complications: Secondary | ICD-10-CM | POA: Diagnosis not present

## 2015-07-02 DIAGNOSIS — Y838 Other surgical procedures as the cause of abnormal reaction of the patient, or of later complication, without mention of misadventure at the time of the procedure: Secondary | ICD-10-CM | POA: Diagnosis not present

## 2015-07-02 DIAGNOSIS — Z89421 Acquired absence of other right toe(s): Secondary | ICD-10-CM | POA: Diagnosis not present

## 2015-07-02 DIAGNOSIS — Z940 Kidney transplant status: Secondary | ICD-10-CM | POA: Diagnosis not present

## 2015-07-02 DIAGNOSIS — S91104A Unspecified open wound of right lesser toe(s) without damage to nail, initial encounter: Secondary | ICD-10-CM | POA: Diagnosis not present

## 2015-07-04 ENCOUNTER — Ambulatory Visit: Payer: Medicare Other | Admitting: *Deleted

## 2015-07-04 DIAGNOSIS — E1151 Type 2 diabetes mellitus with diabetic peripheral angiopathy without gangrene: Secondary | ICD-10-CM

## 2015-07-04 NOTE — Progress Notes (Signed)
Patient ID: Trevor Thompson, male   DOB: 05/22/1961, 54 y.o.   MRN: [id number] Patient presents to be scanned and measured for diabetic shoes and inserts.

## 2015-07-08 ENCOUNTER — Inpatient Hospital Stay: Payer: Medicare Other

## 2015-07-09 ENCOUNTER — Encounter (HOSPITAL_BASED_OUTPATIENT_CLINIC_OR_DEPARTMENT_OTHER): Payer: Medicare Other | Attending: Surgery

## 2015-07-09 DIAGNOSIS — E11621 Type 2 diabetes mellitus with foot ulcer: Secondary | ICD-10-CM | POA: Diagnosis not present

## 2015-07-09 DIAGNOSIS — E1122 Type 2 diabetes mellitus with diabetic chronic kidney disease: Secondary | ICD-10-CM | POA: Insufficient documentation

## 2015-07-09 DIAGNOSIS — I120 Hypertensive chronic kidney disease with stage 5 chronic kidney disease or end stage renal disease: Secondary | ICD-10-CM | POA: Diagnosis not present

## 2015-07-09 DIAGNOSIS — Z89512 Acquired absence of left leg below knee: Secondary | ICD-10-CM | POA: Insufficient documentation

## 2015-07-09 DIAGNOSIS — E1151 Type 2 diabetes mellitus with diabetic peripheral angiopathy without gangrene: Secondary | ICD-10-CM | POA: Insufficient documentation

## 2015-07-09 DIAGNOSIS — N186 End stage renal disease: Secondary | ICD-10-CM | POA: Diagnosis not present

## 2015-07-09 DIAGNOSIS — Z89421 Acquired absence of other right toe(s): Secondary | ICD-10-CM | POA: Insufficient documentation

## 2015-07-09 DIAGNOSIS — L97512 Non-pressure chronic ulcer of other part of right foot with fat layer exposed: Secondary | ICD-10-CM | POA: Diagnosis not present

## 2015-07-10 ENCOUNTER — Telehealth: Payer: Self-pay | Admitting: *Deleted

## 2015-07-10 DIAGNOSIS — I10 Essential (primary) hypertension: Secondary | ICD-10-CM

## 2015-07-10 NOTE — Telephone Encounter (Signed)
[phone] Tiffany LPN Nurse called and states patients BP has been running borderline prior to him taking his medications (Lasix 40 MG and Lisinopril/HCTZ 10-12.5 MG)  BP: 100/70  2HD: 100/60  A FEW DAYS AGO: 88/62  Nurse would like to know if patient needs a change to the dosage.

## 2015-07-11 ENCOUNTER — Telehealth: Payer: Self-pay | Admitting: *Deleted

## 2015-07-11 MED ORDER — LISINOPRIL 10 MG PO TABS: 10.0000 mg | ORAL_TABLET | Freq: Every day | ORAL | Status: DC

## 2015-07-11 NOTE — Telephone Encounter (Signed)
Please call back to patient STOP lisinopril/HCTZ 10-12.5 mg  Replace with lisinopril 10 mg daily only

## 2015-07-11 NOTE — Addendum Note (Signed)
Addended by: Dessa PhiFUNCHES, Chantrell Apsey on: 07/11/2015 02:41 PM   Modules accepted: Orders, Medications

## 2015-07-11 NOTE — Telephone Encounter (Signed)
Medical Assistant left message for Nurse Tiffany to stop patients Lisinopril/HCTZ and begin Lisinopril 10 mg tablet daily.

## 2015-07-12 ENCOUNTER — Other Ambulatory Visit: Payer: Self-pay | Admitting: Family Medicine

## 2015-07-12 DIAGNOSIS — Z794 Long term (current) use of insulin: Principal | ICD-10-CM

## 2015-07-12 DIAGNOSIS — E1159 Type 2 diabetes mellitus with other circulatory complications: Secondary | ICD-10-CM

## 2015-07-16 DIAGNOSIS — E11621 Type 2 diabetes mellitus with foot ulcer: Secondary | ICD-10-CM | POA: Diagnosis not present

## 2015-07-23 DIAGNOSIS — E11621 Type 2 diabetes mellitus with foot ulcer: Secondary | ICD-10-CM | POA: Diagnosis not present

## 2015-07-24 ENCOUNTER — Encounter: Payer: Self-pay | Admitting: Family Medicine

## 2015-07-24 NOTE — Progress Notes (Signed)
Patient ID: Trevor Thompson, male   DOB: 06/13/1961, 55 y.o.   MRN: [id number] Patient seen by renal on 07/18/2015. Due for hypotension Prinzide was discontinued. It appears that patient did not report that prinzide 10-12.5 had already be d/cd by me and replaced with lisinopril 10 mg daily. Lisinopril removed from active med lis.

## 2015-07-28 DIAGNOSIS — E11621 Type 2 diabetes mellitus with foot ulcer: Secondary | ICD-10-CM | POA: Diagnosis not present

## 2015-07-28 LAB — GLUCOSE, CAPILLARY
Glucose-Capillary: 156 mg/dL — ABNORMAL HIGH (ref 65–99)
Glucose-Capillary: 140 mg/dL — ABNORMAL HIGH (ref 65–99)

## 2015-07-29 DIAGNOSIS — E11621 Type 2 diabetes mellitus with foot ulcer: Secondary | ICD-10-CM | POA: Diagnosis not present

## 2015-07-29 LAB — GLUCOSE, CAPILLARY
GLUCOSE-CAPILLARY: 118 mg/dL — AB (ref 65–99)
GLUCOSE-CAPILLARY: 164 mg/dL — AB (ref 65–99)

## 2015-07-30 DIAGNOSIS — E11621 Type 2 diabetes mellitus with foot ulcer: Secondary | ICD-10-CM | POA: Diagnosis not present

## 2015-07-30 LAB — GLUCOSE, CAPILLARY
GLUCOSE-CAPILLARY: 167 mg/dL — AB (ref 65–99)
GLUCOSE-CAPILLARY: 164 mg/dL — AB (ref 65–99)

## 2015-07-31 ENCOUNTER — Inpatient Hospital Stay: Payer: Medicare Other | Admitting: Family Medicine

## 2015-07-31 DIAGNOSIS — E11621 Type 2 diabetes mellitus with foot ulcer: Secondary | ICD-10-CM | POA: Diagnosis not present

## 2015-07-31 LAB — GLUCOSE, CAPILLARY
GLUCOSE-CAPILLARY: 224 mg/dL — AB (ref 65–99)
Glucose-Capillary: 112 mg/dL — ABNORMAL HIGH (ref 65–99)

## 2015-07-31 MED FILL — ATORVASTATIN 20 MG TABLET: 20 | 30 days supply | Qty: 30 | Fill #2

## 2015-08-06 ENCOUNTER — Inpatient Hospital Stay (HOSPITAL_COMMUNITY): Payer: Medicare Other

## 2015-08-06 ENCOUNTER — Inpatient Hospital Stay (HOSPITAL_COMMUNITY)
Admission: EM | Admit: 2015-08-06 | Discharge: 2015-08-14 | DRG: 856 | Disposition: A | Discharge status: Home Health | Payer: Medicare Other | Attending: Internal Medicine | Admitting: Internal Medicine

## 2015-08-06 ENCOUNTER — Encounter (HOSPITAL_BASED_OUTPATIENT_CLINIC_OR_DEPARTMENT_OTHER): Payer: Medicare Other | Attending: Surgery

## 2015-08-06 ENCOUNTER — Emergency Department (HOSPITAL_COMMUNITY): Payer: Medicare Other

## 2015-08-06 ENCOUNTER — Encounter (HOSPITAL_COMMUNITY): Payer: Self-pay

## 2015-08-06 DIAGNOSIS — Z7952 Long term (current) use of systemic steroids: Secondary | ICD-10-CM | POA: Diagnosis not present

## 2015-08-06 DIAGNOSIS — M86171 Other acute osteomyelitis, right ankle and foot: Secondary | ICD-10-CM | POA: Diagnosis present

## 2015-08-06 DIAGNOSIS — E1165 Type 2 diabetes mellitus with hyperglycemia: Secondary | ICD-10-CM

## 2015-08-06 DIAGNOSIS — E11621 Type 2 diabetes mellitus with foot ulcer: Secondary | ICD-10-CM | POA: Insufficient documentation

## 2015-08-06 DIAGNOSIS — E669 Obesity, unspecified: Secondary | ICD-10-CM | POA: Diagnosis present

## 2015-08-06 DIAGNOSIS — Z87891 Personal history of nicotine dependence: Secondary | ICD-10-CM | POA: Diagnosis not present

## 2015-08-06 DIAGNOSIS — E7800 Pure hypercholesterolemia, unspecified: Secondary | ICD-10-CM | POA: Diagnosis present

## 2015-08-06 DIAGNOSIS — Z79899 Other long term (current) drug therapy: Secondary | ICD-10-CM

## 2015-08-06 DIAGNOSIS — Z794 Long term (current) use of insulin: Secondary | ICD-10-CM

## 2015-08-06 DIAGNOSIS — D899 Disorder involving the immune mechanism, unspecified: Secondary | ICD-10-CM | POA: Diagnosis not present

## 2015-08-06 DIAGNOSIS — I872 Venous insufficiency (chronic) (peripheral): Secondary | ICD-10-CM | POA: Diagnosis present

## 2015-08-06 DIAGNOSIS — Z89512 Acquired absence of left leg below knee: Secondary | ICD-10-CM | POA: Diagnosis not present

## 2015-08-06 DIAGNOSIS — Y838 Other surgical procedures as the cause of abnormal reaction of the patient, or of later complication, without mention of misadventure at the time of the procedure: Secondary | ICD-10-CM | POA: Diagnosis present

## 2015-08-06 DIAGNOSIS — E1122 Type 2 diabetes mellitus with diabetic chronic kidney disease: Secondary | ICD-10-CM | POA: Diagnosis present

## 2015-08-06 DIAGNOSIS — E876 Hypokalemia: Secondary | ICD-10-CM | POA: Diagnosis not present

## 2015-08-06 DIAGNOSIS — E875 Hyperkalemia: Secondary | ICD-10-CM | POA: Diagnosis present

## 2015-08-06 DIAGNOSIS — D84821 Immunodeficiency due to drugs: Secondary | ICD-10-CM | POA: Diagnosis present

## 2015-08-06 DIAGNOSIS — E119 Type 2 diabetes mellitus without complications: Secondary | ICD-10-CM

## 2015-08-06 DIAGNOSIS — E1159 Type 2 diabetes mellitus with other circulatory complications: Secondary | ICD-10-CM

## 2015-08-06 DIAGNOSIS — E781 Pure hyperglyceridemia: Secondary | ICD-10-CM | POA: Diagnosis present

## 2015-08-06 DIAGNOSIS — I129 Hypertensive chronic kidney disease with stage 1 through stage 4 chronic kidney disease, or unspecified chronic kidney disease: Secondary | ICD-10-CM | POA: Diagnosis present

## 2015-08-06 DIAGNOSIS — Z89431 Acquired absence of right foot: Secondary | ICD-10-CM | POA: Insufficient documentation

## 2015-08-06 DIAGNOSIS — D631 Anemia in chronic kidney disease: Secondary | ICD-10-CM | POA: Diagnosis present

## 2015-08-06 DIAGNOSIS — Z89421 Acquired absence of other right toe(s): Secondary | ICD-10-CM | POA: Diagnosis not present

## 2015-08-06 DIAGNOSIS — E43 Unspecified severe protein-calorie malnutrition: Secondary | ICD-10-CM | POA: Insufficient documentation

## 2015-08-06 DIAGNOSIS — L97519 Non-pressure chronic ulcer of other part of right foot with unspecified severity: Secondary | ICD-10-CM | POA: Diagnosis present

## 2015-08-06 DIAGNOSIS — E11319 Type 2 diabetes mellitus with unspecified diabetic retinopathy without macular edema: Secondary | ICD-10-CM | POA: Diagnosis present

## 2015-08-06 DIAGNOSIS — K219 Gastro-esophageal reflux disease without esophagitis: Secondary | ICD-10-CM | POA: Diagnosis present

## 2015-08-06 DIAGNOSIS — Z833 Family history of diabetes mellitus: Secondary | ICD-10-CM | POA: Diagnosis not present

## 2015-08-06 DIAGNOSIS — Z880 Allergy status to penicillin: Secondary | ICD-10-CM | POA: Diagnosis not present

## 2015-08-06 DIAGNOSIS — E11628 Type 2 diabetes mellitus with other skin complications: Secondary | ICD-10-CM | POA: Diagnosis present

## 2015-08-06 DIAGNOSIS — Z940 Kidney transplant status: Secondary | ICD-10-CM

## 2015-08-06 DIAGNOSIS — N183 Chronic kidney disease, stage 3 unspecified: Secondary | ICD-10-CM

## 2015-08-06 DIAGNOSIS — E08319 Diabetes mellitus due to underlying condition with unspecified diabetic retinopathy without macular edema: Secondary | ICD-10-CM | POA: Diagnosis not present

## 2015-08-06 DIAGNOSIS — E860 Dehydration: Secondary | ICD-10-CM | POA: Diagnosis present

## 2015-08-06 DIAGNOSIS — D849 Immunodeficiency, unspecified: Secondary | ICD-10-CM | POA: Diagnosis present

## 2015-08-06 DIAGNOSIS — Z899 Acquired absence of limb, unspecified: Secondary | ICD-10-CM

## 2015-08-06 DIAGNOSIS — T814XXA Infection following a procedure, initial encounter: Principal | ICD-10-CM | POA: Diagnosis present

## 2015-08-06 DIAGNOSIS — L97511 Non-pressure chronic ulcer of other part of right foot limited to breakdown of skin: Secondary | ICD-10-CM | POA: Insufficient documentation

## 2015-08-06 DIAGNOSIS — E872 Acidosis: Secondary | ICD-10-CM | POA: Diagnosis present

## 2015-08-06 DIAGNOSIS — E1169 Type 2 diabetes mellitus with other specified complication: Secondary | ICD-10-CM | POA: Diagnosis present

## 2015-08-06 DIAGNOSIS — I120 Hypertensive chronic kidney disease with stage 5 chronic kidney disease or end stage renal disease: Secondary | ICD-10-CM | POA: Insufficient documentation

## 2015-08-06 DIAGNOSIS — I10 Essential (primary) hypertension: Secondary | ICD-10-CM | POA: Diagnosis present

## 2015-08-06 DIAGNOSIS — I739 Peripheral vascular disease, unspecified: Secondary | ICD-10-CM | POA: Diagnosis not present

## 2015-08-06 DIAGNOSIS — N179 Acute kidney failure, unspecified: Secondary | ICD-10-CM | POA: Diagnosis present

## 2015-08-06 DIAGNOSIS — M869 Osteomyelitis, unspecified: Secondary | ICD-10-CM

## 2015-08-06 DIAGNOSIS — R509 Fever, unspecified: Secondary | ICD-10-CM

## 2015-08-06 DIAGNOSIS — Z6835 Body mass index (BMI) 35.0-35.9, adult: Secondary | ICD-10-CM

## 2015-08-06 DIAGNOSIS — N186 End stage renal disease: Secondary | ICD-10-CM | POA: Insufficient documentation

## 2015-08-06 DIAGNOSIS — T380X1A Poisoning by glucocorticoids and synthetic analogues, accidental (unintentional), initial encounter: Secondary | ICD-10-CM | POA: Diagnosis not present

## 2015-08-06 DIAGNOSIS — Z01818 Encounter for other preprocedural examination: Secondary | ICD-10-CM

## 2015-08-06 DIAGNOSIS — A419 Sepsis, unspecified organism: Secondary | ICD-10-CM | POA: Diagnosis present

## 2015-08-06 DIAGNOSIS — L97512 Non-pressure chronic ulcer of other part of right foot with fat layer exposed: Secondary | ICD-10-CM | POA: Insufficient documentation

## 2015-08-06 DIAGNOSIS — E1152 Type 2 diabetes mellitus with diabetic peripheral angiopathy with gangrene: Secondary | ICD-10-CM | POA: Insufficient documentation

## 2015-08-06 DIAGNOSIS — E1136 Type 2 diabetes mellitus with diabetic cataract: Secondary | ICD-10-CM | POA: Insufficient documentation

## 2015-08-06 LAB — CBC WITH DIFFERENTIAL/PLATELET
BASOS ABS: 0 10*3/uL (ref 0.0–0.1)
Basophils Relative: 0 %
EOS PCT: 1 %
Eosinophils Absolute: 0.2 10*3/uL (ref 0.0–0.7)
HCT: 30.2 % — ABNORMAL LOW (ref 39.0–52.0)
Hemoglobin: 9.4 g/dL — ABNORMAL LOW (ref 13.0–17.0)
LYMPHS PCT: 7 %
Lymphs Abs: 1.3 10*3/uL (ref 0.7–4.0)
MCH: 26.4 pg (ref 26.0–34.0)
MCHC: 31.1 g/dL (ref 30.0–36.0)
MCV: 84.8 fL (ref 78.0–100.0)
MONOS PCT: 14 %
Monocytes Absolute: 2.5 10*3/uL — ABNORMAL HIGH (ref 0.1–1.0)
NEUTROS PCT: 78 %
Neutro Abs: 14.1 10*3/uL — ABNORMAL HIGH (ref 1.7–7.7)
PLATELETS: 317 10*3/uL (ref 150–400)
RBC: 3.56 MIL/uL — AB (ref 4.22–5.81)
RDW: 14.2 % (ref 11.5–15.5)
WBC: 18.1 10*3/uL — AB (ref 4.0–10.5)

## 2015-08-06 LAB — COMPREHENSIVE METABOLIC PANEL
ALBUMIN: 2.4 g/dL — AB (ref 3.5–5.0)
ALK PHOS: 59 U/L (ref 38–126)
ALT: 10 U/L — ABNORMAL LOW (ref 17–63)
AST: 11 U/L — AB (ref 15–41)
Anion gap: 8 (ref 5–15)
BILIRUBIN TOTAL: 0.6 mg/dL (ref 0.3–1.2)
BUN: 43 mg/dL — AB (ref 6–20)
CO2: 21 mmol/L — ABNORMAL LOW (ref 22–32)
Calcium: 9.2 mg/dL (ref 8.9–10.3)
Chloride: 109 mmol/L (ref 101–111)
Creatinine, Ser: 2.39 mg/dL — ABNORMAL HIGH (ref 0.61–1.24)
GFR calc Af Amer: 33 mL/min — ABNORMAL LOW (ref >60)
GFR, EST NON AFRICAN AMERICAN: 29 mL/min — AB (ref >60)
GLUCOSE: 164 mg/dL — AB (ref 65–99)
Potassium: 5.9 mmol/L — ABNORMAL HIGH (ref 3.5–5.1)
Sodium: 138 mmol/L (ref 135–145)
Total Protein: 6.7 g/dL (ref 6.5–8.1)

## 2015-08-06 LAB — CBG MONITORING, ED
GLUCOSE-CAPILLARY: 152 mg/dL — AB (ref 65–99)
GLUCOSE-CAPILLARY: 150 mg/dL — AB (ref 65–99)
Glucose-Capillary: 106 mg/dL — ABNORMAL HIGH (ref 65–99)

## 2015-08-06 LAB — GLUCOSE, CAPILLARY: Glucose-Capillary: 145 mg/dL — ABNORMAL HIGH (ref 65–99)

## 2015-08-06 MED ORDER — VANCOMYCIN HCL 10 G IV SOLR
1250.0000 mg | INTRAVENOUS | Status: DC
  Administered 2015-08-07 – 2015-08-11 (×5): 1250 mg via INTRAVENOUS
  Filled 2015-08-06 (×5): qty 1250

## 2015-08-06 MED ORDER — FUROSEMIDE 40 MG PO TABS
40.0000 mg | ORAL_TABLET | Freq: Every day | ORAL | Status: DC
  Administered 2015-08-07 – 2015-08-14 (×8): 40 mg via ORAL
  Filled 2015-08-06 (×8): qty 1

## 2015-08-06 MED ORDER — ONDANSETRON HCL 4 MG PO TABS: 4.0000 mg | ORAL_TABLET | Freq: Four times a day (QID) | ORAL | Status: DC | PRN

## 2015-08-06 MED ORDER — SODIUM POLYSTYRENE SULFONATE 15 GM/60ML PO SUSP: 30.0000 g | Freq: Once | ORAL | Status: DC

## 2015-08-06 MED ORDER — PREDNISONE 5 MG PO TABS
5.0000 mg | ORAL_TABLET | Freq: Every day | ORAL | Status: DC
  Administered 2015-08-07 – 2015-08-14 (×8): 5 mg via ORAL
  Filled 2015-08-06 (×8): qty 1

## 2015-08-06 MED ORDER — MYCOPHENOLATE SODIUM 180 MG PO TBEC
720.0000 mg | DELAYED_RELEASE_TABLET | Freq: Two times a day (BID) | ORAL | Status: DC
  Administered 2015-08-06 – 2015-08-14 (×16): 720 mg via ORAL
  Filled 2015-08-06 (×18): qty 4

## 2015-08-06 MED ORDER — SODIUM CHLORIDE 0.9% FLUSH
3.0000 mL | Freq: Two times a day (BID) | INTRAVENOUS | Status: DC
  Administered 2015-08-07: 3 mL via INTRAVENOUS

## 2015-08-06 MED ORDER — TACROLIMUS 1 MG PO CAPS
4.0000 mg | ORAL_CAPSULE | Freq: Two times a day (BID) | ORAL | Status: DC
  Administered 2015-08-06 – 2015-08-14 (×15): 4 mg via ORAL
  Filled 2015-08-06 (×15): qty 4

## 2015-08-06 MED ORDER — ACETAMINOPHEN 650 MG RE SUPP: 650.0000 mg | Freq: Four times a day (QID) | RECTAL | Status: DC | PRN

## 2015-08-06 MED ORDER — METRONIDAZOLE IN NACL 5-0.79 MG/ML-% IV SOLN
500.0000 mg | Freq: Three times a day (TID) | INTRAVENOUS | Status: DC
  Administered 2015-08-06 – 2015-08-14 (×24): 500 mg via INTRAVENOUS
  Filled 2015-08-06 (×25): qty 100

## 2015-08-06 MED ORDER — SODIUM CHLORIDE 0.9 % IV BOLUS (SEPSIS)
1000.0000 mL | Freq: Once | INTRAVENOUS | Status: AC
  Administered 2015-08-06: 1000 mL via INTRAVENOUS

## 2015-08-06 MED ORDER — VANCOMYCIN HCL 10 G IV SOLR
2000.0000 mg | INTRAVENOUS | Status: AC
  Administered 2015-08-06: 2000 mg via INTRAVENOUS
  Filled 2015-08-06: qty 2000

## 2015-08-06 MED ORDER — OXYCODONE-ACETAMINOPHEN 5-325 MG PO TABS
1.0000 | ORAL_TABLET | Freq: Once | ORAL | Status: AC
  Administered 2015-08-06: 1 via ORAL
  Filled 2015-08-06: qty 1

## 2015-08-06 MED ORDER — AZTREONAM 2 G IJ SOLR
2.0000 g | INTRAMUSCULAR | Status: AC
  Administered 2015-08-06: 2 g via INTRAVENOUS
  Filled 2015-08-06: qty 2

## 2015-08-06 MED ORDER — SODIUM CHLORIDE 0.9 % IV SOLN
INTRAVENOUS | Status: DC
  Administered 2015-08-06 – 2015-08-07 (×3): via INTRAVENOUS

## 2015-08-06 MED ORDER — HEPARIN SODIUM (PORCINE) 5000 UNIT/ML IJ SOLN
5000.0000 [IU] | Freq: Three times a day (TID) | INTRAMUSCULAR | Status: DC
  Administered 2015-08-07 (×2): 5000 [IU] via SUBCUTANEOUS
  Filled 2015-08-06 (×3): qty 1

## 2015-08-06 MED ORDER — DEXTROSE 5 % IV SOLN
2.0000 g | Freq: Once | INTRAVENOUS | Status: AC
  Administered 2015-08-06: 2 g via INTRAVENOUS
  Filled 2015-08-06: qty 2

## 2015-08-06 MED ORDER — ONDANSETRON HCL 4 MG/2ML IJ SOLN: 4.0000 mg | Freq: Four times a day (QID) | INTRAMUSCULAR | Status: DC | PRN

## 2015-08-06 MED ORDER — INSULIN ASPART 100 UNIT/ML ~~LOC~~ SOLN
0.0000 [IU] | SUBCUTANEOUS | Status: DC
  Administered 2015-08-06 – 2015-08-07 (×5): 1 [IU] via SUBCUTANEOUS
  Filled 2015-08-06: qty 1

## 2015-08-06 MED ORDER — LEVALBUTEROL HCL 0.63 MG/3ML IN NEBU: 0.6300 mg | INHALATION_SOLUTION | Freq: Four times a day (QID) | RESPIRATORY_TRACT | Status: DC | PRN

## 2015-08-06 MED ORDER — ACETAMINOPHEN 325 MG PO TABS: 650.0000 mg | ORAL_TABLET | Freq: Four times a day (QID) | ORAL | Status: DC | PRN

## 2015-08-06 MED ORDER — INSULIN GLARGINE 100 UNIT/ML ~~LOC~~ SOLN
50.0000 [IU] | Freq: Every day | SUBCUTANEOUS | Status: DC
  Administered 2015-08-06: 50 [IU] via SUBCUTANEOUS
  Filled 2015-08-06 (×3): qty 0.5

## 2015-08-06 MED ORDER — INSULIN GLARGINE 100 UNIT/ML SOLOSTAR PEN: 50.0000 [IU] | PEN_INJECTOR | Freq: Every day | SUBCUTANEOUS | Status: DC

## 2015-08-06 MED ORDER — DEXTROSE 5 % IV SOLN
1.0000 g | Freq: Two times a day (BID) | INTRAVENOUS | Status: DC
  Administered 2015-08-07: 1 g via INTRAVENOUS
  Filled 2015-08-06 (×3): qty 1

## 2015-08-06 MED ORDER — SENNOSIDES-DOCUSATE SODIUM 8.6-50 MG PO TABS: 1.0000 | ORAL_TABLET | Freq: Every evening | ORAL | Status: DC | PRN

## 2015-08-06 MED ORDER — ATORVASTATIN CALCIUM 20 MG PO TABS: 20.0000 mg | ORAL_TABLET | Freq: Every day | ORAL | Status: DC

## 2015-08-06 NOTE — ED Provider Notes (Signed)
CSN: [id number]     Arrival date & time 08/06/15  1009 History   First MD Initiated Contact with Patient 08/06/15 1126     Chief Complaint  Patient presents with  . Wound Check     (Consider location/radiation/quality/duration/timing/severity/associated sxs/prior Treatment) HPI   Patient is a 55 year old male with diabetes, peripheral last year disease status post renal transplant left BKA presenting with right foot wound. This right foot wound has been in various stages of healing for the last 2 months. Patient is being followed by podiatry and Dr. Berenice Primas from [facility] orthopedics. Patient has had 2 amputations of 2 toes on his right foot. He went to his appointment with podiatry and was found to have worsening wound healing and was sent here for evaluation.   In the room, patient's wound is malodorous and frankly purulent.  Past Medical History  Diagnosis Date  . Hypertension   . PVD (peripheral vascular disease) ([place])   . S/p cadaver renal transplant   . Cold intolerance   . 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 GERD (gastroesophageal reflux disease)   . 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 Hypertriglyceridemia   . Diabetic retinopathy ([place])   . Sleep apnea     in the past .  Was cleared in 2004 to not wear CPap.  Dr in [place]  . High cholesterol   . Pneumonia 05/09/2013    "first time was today" (05/09/2013)  . Type II diabetes mellitus ([place])   . ESRD (end stage renal disease) ([place])     "no dialysis; had kidney transplant" (05/09/2013)   Past Surgical History  Procedure Laterality Date  . Kidney transplant Right 2004  . Cataract extraction w/ intraocular lens  implant, bilateral Bilateral ~ 1997  . Av fistula placement  1997-2004    "I've got 3; right upper arm; right forearm; left forearm "  . Av fistula repair  1997-2004    "multiple"  . Amputation  02/25/2012    Procedure: AMPUTATION DIGIT;  Surgeon: Rosetta Posner, MD;  [facility];  Service:  Vascular;  Laterality: Left;  Left 2nd Toe Amputation, Incision and Drainage left lateral foot.  . Amputation  03/22/2012    Procedure: AMPUTATION BELOW KNEE;  Surgeon: Newt Minion, MD;  Location: [place];  Service: Orthopedics;  Laterality: Left;  Left Below Knee Amputation  . Amputation  04/21/2012    Procedure: AMPUTATION BELOW KNEE;  Surgeon: Newt Minion, MD;  Location: [place];  Service: Orthopedics;  Laterality: Left;  Left below knee amputation revision  . Abdominal aortagram N/A 02/22/2012    Procedure: ABDOMINAL Maxcine Ham;  Surgeon: Serafina Mitchell, MD;  Location: [facility] CATH LAB;  Service: Cardiovascular;  Laterality: N/A;   Family History  Problem Relation Age of Onset  . Diabetes Mother   . Diabetes Father   . Lupus Sister    Social History  Substance Use Topics  . Smoking status: Former Smoker -- 0.30 packs/day for 3 years    Types: Cigarettes    Quit date: 07/05/1981  . Smokeless tobacco: Never Used  . Alcohol Use: 1.8 oz/week    3 Cans of beer per week     Comment: occasional    Review of Systems  Constitutional: Negative for activity change.  Respiratory: Negative for shortness of breath.   Cardiovascular: Negative for chest pain.  Gastrointestinal: Negative for abdominal pain.  Genitourinary: Negative for dysuria.      Allergies  Penicillins  Home Medications   Prior to Admission medications   Medication Sig Start Date End Date Taking? Authorizing Provider  calcitRIOL (ROCALTROL) 0.5 MCG capsule Take 0.5 mcg by mouth daily. 07/22/15  Yes Historical Provider, MD  furosemide (LASIX) 40 MG tablet Take 40 mg by mouth daily. 02/08/15  Yes Historical Provider, MD  Insulin Glargine (LANTUS SOLOSTAR) 100 UNIT/ML Solostar Pen Inject 50 Units into the skin daily at 10 pm. 05/21/15  Yes Boykin Nearing, MD  mycophenolate (MYFORTIC) 360 MG TBEC EC tablet Take 720 mg by mouth 2 (two) times daily.    Yes Historical Provider, MD  predniSONE (DELTASONE) 5 MG tablet Take 5 mg by  mouth daily with breakfast.   Yes Historical Provider, MD  tacrolimus (PROGRAF) 1 MG capsule Take 4 mg by mouth 2 (two) times daily.    Yes Historical Provider, MD  ACCU-CHEK SOFTCLIX LANCETS lancets 1 each by Other route 3 (three) times daily. 04/25/15   Josalyn Funches, MD  atorvastatin (LIPITOR) 20 MG tablet Take 1 tablet (20 mg total) by mouth daily. Patient not taking: Reported on 08/06/2015 04/25/15   Boykin Nearing, MD  Blood Glucose Monitoring Suppl (ACCU-CHEK AVIVA PLUS) W/DEVICE KIT 1 Device by Does not apply route 3 (three) times daily after meals. 04/25/15   Josalyn Funches, MD  glucose blood (ACCU-CHEK AVIVA PLUS) test strip 1 each by Other route 3 (three) times daily. E11.9 04/25/15   Josalyn Funches, MD   BP 135/73 mmHg  Pulse 99  Temp(Src) 98.1 F (36.7 C) (Oral)  Resp 14  Wt 255 lb (115.667 kg)  SpO2 98% Physical Exam  Constitutional: He is oriented to person, place, and time. He appears well-nourished.  HENT:  Head: Normocephalic.  Mouth/Throat: Oropharynx is clear and moist.  Eyes: Conjunctivae are normal.  Neck: No tracheal deviation present.  Cardiovascular: Normal rate.   Pulmonary/Chest: Effort normal. No stridor. No respiratory distress.  Abdominal: Soft. There is no tenderness. There is no guarding.  Musculoskeletal: Normal range of motion. He exhibits no edema.  Left BKA. Right foot with open wound with frank purulence.  Neurological: He is oriented to person, place, and time. No cranial nerve deficit.  Skin: Skin is warm and dry. No rash noted. He is not diaphoretic.  Psychiatric: He has a normal mood and affect. His behavior is normal.  Nursing note and vitals reviewed.   ED Course  Procedures (including critical care time) Labs Review Labs Reviewed  CBC WITH DIFFERENTIAL/PLATELET - Abnormal; Notable for the following:    WBC 18.1 (*)    RBC 3.56 (*)    Hemoglobin 9.4 (*)    HCT 30.2 (*)    Neutro Abs 14.1 (*)    Monocytes Absolute 2.5 (*)    All  other components within normal limits  COMPREHENSIVE METABOLIC PANEL - Abnormal; Notable for the following:    Potassium 5.9 (*)    CO2 21 (*)    Glucose, Bld 164 (*)    BUN 43 (*)    Creatinine, Ser 2.39 (*)    Albumin 2.4 (*)    AST 11 (*)    ALT 10 (*)    GFR calc non Af Amer 29 (*)    GFR calc Af Amer 33 (*)    All other components within normal limits  CBG MONITORING, ED - Abnormal; Notable for the following:    Glucose-Capillary 152 (*)    All other components within normal limits  CULTURE, BLOOD (SINGLE)  WOUND CULTURE  Imaging Review No results found. I have personally reviewed and evaluated these images and lab results as part of my medical decision-making.   EKG Interpretation None      MDM   Final diagnoses:  Osteomyelitis [facility])    Patient's age very pleasant 55 year old male presenting with peripheral PVD or disease, diabetes, renal transplant presenting with infection of her right foot wound. Patient has had multiple indication of the toes in that foot by Dr. Berenice Primas. He's had hyperbaric treatment and antibiotic outpatient treatment. Seen by podiatry today and sent here for further intervention.   3:22 PM Discussed with Dr. Berenice Primas from orthopedics. His plan will be to take him to surgery on Friday at [facility]. Patient's MRI is in the computer under care Link.  We will admit patient to hospitalist service at [facility].  Will give fluids for patient's mildly elevated K. Will likely need renal involvement in the future given kidney failure, ho transplant.   Trevor Thrun Julio Alm, MD 08/06/15 [phone]

## 2015-08-06 NOTE — ED Notes (Signed)
CBG: 150 

## 2015-08-06 NOTE — H&P (Addendum)
[facility] History and Physical  Damarie Schoolfield PFX:[id number] DOB: 03-24-1961 DOA: 08/06/2015  Referring physician:   PCP: Minerva Ends, MD   Chief Complaint:    [id number]  55 year old male with a history of renal transplant, peripheral vascular disease, essential hypertension, peripheral neuropathy secondary to diabetes, type 2 diabetes on insulin, status post left BKA, who presents to the ER today for right foot infection. Patient is followed by podiatry Gardiner Barefoot, DPM, for history of hammertoe of the second right foot, who presents with symptoms of foul-smelling drainage from his right foot, there appears to be developing dry gangrene of the fourth digit, as well as, cyanosis of all the other digits of the right foot. Patient has already had 2 amputations of 2 toes on the right foot by Dr. Berenice Primas, patient states he received an antibiotic 3 weeks ago but cannot remember the exact name. Was seen at the podiatry office today and was sent here for evaluation. In the ER the patient was found in the white blood cell count of 18.1, creatinine 2.39, potassium 5.9, glucose 164. Baseline creatinine is around 2.2. Patient is followed by Dr. Florene Glen.      Review of Systems: negative for the following  Constitutional: Denies fever, chills, diaphoresis, appetite change and fatigue.  HEENT: Denies photophobia, eye pain, redness, hearing loss, ear pain, congestion, sore throat, rhinorrhea, sneezing, mouth sores, trouble swallowing, neck pain, neck stiffness and tinnitus.  Respiratory: Denies SOB, DOE, cough, chest tightness, and wheezing.  Cardiovascular: Denies chest pain, palpitations and leg swelling.  Gastrointestinal: Denies nausea, vomiting, abdominal pain, diarrhea, constipation, blood in stool and abdominal distention.  Genitourinary: Denies dysuria, urgency, frequency, hematuria, flank pain and difficulty urinating.  Musculoskeletal: Foul-smelling drainage from the right second third and  fourth digits  Skin: Denies pallor, rash and wound.  Neurological: Denies dizziness, seizures, syncope, weakness, light-headedness, numbness and headaches.  Hematological: Denies adenopathy. Easy bruising, personal or family bleeding history  Psychiatric/Behavioral: Denies suicidal ideation, mood changes, confusion, nervousness, sleep disturbance and agitation       Past Medical History  Diagnosis Date  . Hypertension   . PVD (peripheral vascular disease) ([place])   . S/p cadaver renal transplant   . Cold intolerance   . 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 GERD (gastroesophageal reflux disease)   . 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 Hypertriglyceridemia   . Diabetic retinopathy ([place])   . Sleep apnea     in the past .  Was cleared in 2004 to not wear CPap.  Dr in [place]  . High cholesterol   . Pneumonia 05/09/2013    "first time was today" (05/09/2013)  . Type II diabetes mellitus ([place])   . ESRD (end stage renal disease) ([place])     "no dialysis; had kidney transplant" (05/09/2013)     Past Surgical History  Procedure Laterality Date  . Kidney transplant Right 2004  . Cataract extraction w/ intraocular lens  implant, bilateral Bilateral ~ 1997  . Av fistula placement  1997-2004    "I've got 3; right upper arm; right forearm; left forearm "  . Av fistula repair  1997-2004    "multiple"  . Amputation  02/25/2012    Procedure: AMPUTATION DIGIT;  Surgeon: Rosetta Posner, MD;  Location: [facility] OR;  Service: Vascular;  Laterality: Left;  Left 2nd Toe Amputation, Incision and Drainage left lateral foot.  . Amputation  03/22/2012    Procedure: AMPUTATION BELOW KNEE;  Surgeon: Illene Regulus  Sharol Given, MD;  Location: [facility];  Service: Orthopedics;  Laterality: Left;  Left Below Knee Amputation  . Amputation  04/21/2012    Procedure: AMPUTATION BELOW KNEE;  Surgeon: Newt Minion, MD;  Location: [place] [facility];  Service: Orthopedics;  Laterality: Left;  Left below knee amputation revision  .  Abdominal aortagram N/A 02/22/2012    Procedure: ABDOMINAL Maxcine Ham;  Surgeon: Serafina Mitchell, MD;  Location: [facility] CATH LAB;  Service: Cardiovascular;  Laterality: N/A;      Social History:  reports that he quit smoking about 34 years ago. His smoking use included Cigarettes. He has a .9 pack-year smoking history. He has never used smokeless tobacco. He reports that he drinks about 1.8 oz of alcohol per week. He reports that he does not use illicit drugs.    Allergies  Allergen Reactions  . Penicillins Other (See Comments)    Has patient had a PCN reaction causing immediate rash, facial/tongue/throat swelling, SOB or lightheadedness with hypotension: No Has patient had a PCN reaction causing severe rash involving mucus membranes or skin necrosis: No Has patient had a PCN reaction that required hospitalization. No Has patient had a PCN reaction occurring within the last 10 years: No If all of the above answers are "NO", then may proceed with Cephalosporin use.     Family History  Problem Relation Age of Onset  . Diabetes Mother   . Diabetes Father   . Lupus Sister         Prior to Admission medications   Medication Sig Start Date End Date Taking? Authorizing Provider  calcitRIOL (ROCALTROL) 0.5 MCG capsule Take 0.5 mcg by mouth daily. 07/22/15  Yes Historical Provider, MD  furosemide (LASIX) 40 MG tablet Take 40 mg by mouth daily. 02/08/15  Yes Historical Provider, MD  Insulin Glargine (LANTUS SOLOSTAR) 100 UNIT/ML Solostar Pen Inject 50 Units into the skin daily at 10 pm. 05/21/15  Yes Boykin Nearing, MD  mycophenolate (MYFORTIC) 360 MG TBEC EC tablet Take 720 mg by mouth 2 (two) times daily.    Yes Historical Provider, MD  predniSONE (DELTASONE) 5 MG tablet Take 5 mg by mouth daily with breakfast.   Yes Historical Provider, MD  tacrolimus (PROGRAF) 1 MG capsule Take 4 mg by mouth 2 (two) times daily.    Yes Historical Provider, MD  ACCU-CHEK SOFTCLIX LANCETS lancets 1 each by  Other route 3 (three) times daily. 04/25/15   Josalyn Funches, MD  atorvastatin (LIPITOR) 20 MG tablet Take 1 tablet (20 mg total) by mouth daily. Patient not taking: Reported on 08/06/2015 04/25/15   Boykin Nearing, MD  Blood Glucose Monitoring Suppl (ACCU-CHEK AVIVA PLUS) W/DEVICE KIT 1 Device by Does not apply route 3 (three) times daily after meals. 04/25/15   Josalyn Funches, MD  glucose blood (ACCU-CHEK AVIVA PLUS) test strip 1 each by Other route 3 (three) times daily. E11.9 04/25/15   Boykin Nearing, MD     Physical Exam: Filed Vitals:   08/06/15 1032 08/06/15 1256 08/06/15 1256 08/06/15 1520  BP: 115/83  116/87 135/73  Pulse: 114  99 99  Temp: 98.1 F (36.7 C)     TempSrc: Oral     Resp: _0 Weight:  115.667 kg (255 lb)    SpO2: 100%  100% 98%     Constitutional: Vital signs reviewed. Patient is a chronically ill-appearing in no acute distress and cooperative with exam. Alert and oriented x3.  Head: Normocephalic and atraumatic  Ear: TM normal  bilaterally  Mouth: no erythema or exudates, MMM  Eyes: PERRL, EOMI, conjunctivae normal, No scleral icterus.  Neck: Supple, Trachea midline normal ROM, No JVD, mass, thyromegaly, or carotid bruit present.  Cardiovascular: RRR, S1 normal, S2 normal, no MRG, pulses symmetric and intact bilaterally  Pulmonary/Chest: CTAB, no wheezes, rales, or rhonchi  Abdominal: Soft. Non-tender, non-distended, bowel sounds are normal, no masses, organomegaly, or guarding present.  GU: no CVA tenderness Left BKA. Right foot with open wound with frank purulence.  Ext: no edema and no cyanosis, pulses palpable bilaterally (DP and PT)  Hematology: no cervical, inginal, or axillary adenopathy.  Neurological: A&O x3, Strenght is normal and symmetric bilaterally, cranial nerve II-XII are grossly intact, no focal motor deficit, sensory intact to light touch bilaterally.  Skin: Warm, dry and intact. No rash, cyanosis, or clubbing.  Psychiatric: Normal  mood and affect. speech and behavior is normal. Judgment and thought content normal. Cognition and memory are normal.      Data Review   Micro Results No results found for this or any previous visit (from the past 240 hour(s)).  Radiology Reports No results found.   CBC  Recent Labs Lab 08/06/15 1208  WBC 18.1*  HGB 9.4*  HCT 30.2*  PLT 317  MCV 84.8  MCH 26.4  MCHC 31.1  RDW 14.2  LYMPHSABS 1.3  MONOABS 2.5*  EOSABS 0.2  BASOSABS 0.0    Chemistries   Recent Labs Lab 08/06/15 1327  NA 138  K 5.9*  CL 109  CO2 21*  GLUCOSE 164*  BUN 43*  CREATININE 2.39*  CALCIUM 9.2  AST 11*  ALT 10*  ALKPHOS 59  BILITOT 0.6   ------------------------------------------------------------------------------------------------------------------ estimated creatinine clearance is 45.2 mL/min (by C-G formula based on Cr of 2.39). ------------------------------------------------------------------------------------------------------------------ No results for input(s): HGBA1C in the last 72 hours. ------------------------------------------------------------------------------------------------------------------ No results for input(s): CHOL, HDL, LDLCALC, TRIG, CHOLHDL, LDLDIRECT in the last 72 hours. ------------------------------------------------------------------------------------------------------------------ No results for input(s): TSH, T4TOTAL, T3FREE, THYROIDAB in the last 72 hours.  Invalid input(s): FREET3 ------------------------------------------------------------------------------------------------------------------ No results for input(s): VITAMINB12, FOLATE, FERRITIN, TIBC, IRON, RETICCTPCT in the last 72 hours.  Coagulation profile No results for input(s): INR, PROTIME in the last 168 hours.  No results for input(s): DDIMER in the last 72 hours.  Cardiac Enzymes No results for input(s): CKMB, TROPONINI, MYOGLOBIN in the last 168 hours.  Invalid input(s):  CK ------------------------------------------------------------------------------------------------------------------ Invalid input(s): POCBNP   CBG:  Recent Labs Lab 07/31/15 0928 07/31/15 1119 08/06/15 1037  GLUCAP 112* 224* 152*       EKG: Independently reviewed.    Assessment/Plan Principal Problem:   Osteomyelitis (HCC)-patient has been started on broad-spectrum antibiotics namely vancomycin and cefepime, Blood culture 2 Dr. 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 medications-continue Prograf and mycophenolate Notify nephrology about this admission to help with management of immunosuppression, check prograf level ,discussed with Dr Posey Pronto ,he will see him in am      Diabetes type 2, controlled (HCC)-continue Lantus, at the site, check hemoglobin A1c    HTN (hypertension)-hold Lasix, and the setting of renal insufficiency    GERD (gastroesophageal reflux disease)-stable    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  Chronic kidney disease stage III, baseline around 2.2, creatinine slightly above baseline likely secondary to dehydration, started on IV hydration, consult nephrology         Code Status Orders        Start     Ordered   08/06/15 1635  Full code   Continuous     08/06/15 1638    Family Communication: bedside Disposition Plan: admit   Total time spent 55 minutes.Greater than 50% of this time was spent in counseling, explanation of diagnosis, planning of further management, and coordination of care  [facility] Pager [phone]  If 7PM-7AM, please contact  night-coverage [email or web address] Password [facility] 08/06/2015, 4:39 PM

## 2015-08-06 NOTE — Progress Notes (Signed)
Pharmacy Antibiotic Note  Trevor Thompson is a 55 y.o. male admitted on 08/06/2015 with progressive deterioration of right foot wound with odor noted. PMH includes DM, L BKA, and s/p renal transplant. Given clinical history and transplant status, MD wanting to cover MRSA and pseudomonas.  To begin vancomycin, cefepime, and flagyl per pharmacy  Today, 08/06/2015: Temp: afebrile WBC: elevated Renal: SCr elevated; unclear if this is patient's baseline; has been in this vicinity for ~12 mos per Epic; CrCl 45 CG, 35 N  Plan:  Vancomycin 2000 mg IV now, then 1250 mg IV q24 hr  Measure vancomycin trough levels at steady state as indicated  Cefepime 2 g IV now, then 1 g IV q12 hr  Flagyl 500 mg IV q8 hr  Follow clinical course and LOT; will likely need amputation  Weight: 255 lb (115.667 kg)  Temp (24hrs), Avg:98.1 F (36.7 C), Min:98.1 F (36.7 C), Max:98.1 F (36.7 C)   Recent Labs Lab 08/06/15 1208 08/06/15 1327  WBC 18.1*  --   CREATININE  --  2.39*    Estimated Creatinine Clearance: 45.2 mL/min (by C-G formula based on Cr of 2.39).    Allergies  Allergen Reactions  . Penicillins Other (See Comments)    Has patient had a PCN reaction causing immediate rash, facial/tongue/throat swelling, SOB or lightheadedness with hypotension: No Has patient had a PCN reaction causing severe rash involving mucus membranes or skin necrosis: No Has patient had a PCN reaction that required hospitalization. No Has patient had a PCN reaction occurring within the last 10 years: No If all of the above answers are "NO", then may proceed with Cephalosporin use.     Antimicrobials this admission: 2/1 vancomycin >>  2/1 aztreonam x 1 2/1 cefepime >>  2/1 metronidazole >>    Dose adjustments this admission:   Microbiology results: 2/1 BCx: IP (x1 only) 2/1 wound Cx: IP  Thank you for allowing pharmacy to be a part of this patient's care.  Bernadene Person, PharmD, BCPS Pager:  [phone] 08/06/2015, 3:35 PM

## 2015-08-06 NOTE — ED Notes (Addendum)
Pt sent from wound care center for R foot wound check.  Pain score 7/10.  Paperwork from [facility] sts "progressive deterioration."  Odor noted.  Hx of L BKA and DM.

## 2015-08-06 NOTE — Progress Notes (Signed)
i have spoken with [facility] about patient and he will likely need some form of amputation later this week.  They will admit him and i will see him later today or tomorrow.

## 2015-08-07 ENCOUNTER — Other Ambulatory Visit: Payer: Self-pay | Admitting: Orthopedic Surgery

## 2015-08-07 ENCOUNTER — Encounter (HOSPITAL_COMMUNITY): Payer: Self-pay | Admitting: *Deleted

## 2015-08-07 DIAGNOSIS — E43 Unspecified severe protein-calorie malnutrition: Secondary | ICD-10-CM | POA: Insufficient documentation

## 2015-08-07 DIAGNOSIS — I739 Peripheral vascular disease, unspecified: Secondary | ICD-10-CM

## 2015-08-07 DIAGNOSIS — D899 Disorder involving the immune mechanism, unspecified: Secondary | ICD-10-CM

## 2015-08-07 LAB — GLUCOSE, CAPILLARY
GLUCOSE-CAPILLARY: 121 mg/dL — AB (ref 65–99)
GLUCOSE-CAPILLARY: 139 mg/dL — AB (ref 65–99)
Glucose-Capillary: 111 mg/dL — ABNORMAL HIGH (ref 65–99)
Glucose-Capillary: 125 mg/dL — ABNORMAL HIGH (ref 65–99)
Glucose-Capillary: 129 mg/dL — ABNORMAL HIGH (ref 65–99)
Glucose-Capillary: 136 mg/dL — ABNORMAL HIGH (ref 65–99)

## 2015-08-07 LAB — COMPREHENSIVE METABOLIC PANEL
ALBUMIN: 2 g/dL — AB (ref 3.5–5.0)
ALK PHOS: 75 U/L (ref 38–126)
ALT: 8 U/L — ABNORMAL LOW (ref 17–63)
ANION GAP: 12 (ref 5–15)
AST: 13 U/L — AB (ref 15–41)
BUN: 36 mg/dL — AB (ref 6–20)
CO2: 16 mmol/L — AB (ref 22–32)
Calcium: 9.1 mg/dL (ref 8.9–10.3)
Chloride: 110 mmol/L (ref 101–111)
Creatinine, Ser: 2.38 mg/dL — ABNORMAL HIGH (ref 0.61–1.24)
GFR calc Af Amer: 34 mL/min — ABNORMAL LOW (ref >60)
GFR calc non Af Amer: 29 mL/min — ABNORMAL LOW (ref >60)
GLUCOSE: 145 mg/dL — AB (ref 65–99)
POTASSIUM: 5.5 mmol/L — AB (ref 3.5–5.1)
SODIUM: 138 mmol/L (ref 135–145)
Total Bilirubin: 0.7 mg/dL (ref 0.3–1.2)
Total Protein: 6.3 g/dL — ABNORMAL LOW (ref 6.5–8.1)

## 2015-08-07 LAB — CBC WITH DIFFERENTIAL/PLATELET
BASOS ABS: 0 10*3/uL (ref 0.0–0.1)
BASOS PCT: 0 %
EOS ABS: 0.2 10*3/uL (ref 0.0–0.7)
Eosinophils Relative: 1 %
HCT: 31 % — ABNORMAL LOW (ref 39.0–52.0)
HEMOGLOBIN: 9.6 g/dL — AB (ref 13.0–17.0)
LYMPHS PCT: 10 %
Lymphs Abs: 2 10*3/uL (ref 0.7–4.0)
MCH: 26.5 pg (ref 26.0–34.0)
MCHC: 31 g/dL (ref 30.0–36.0)
MCV: 85.6 fL (ref 78.0–100.0)
MONO ABS: 0.8 10*3/uL (ref 0.1–1.0)
Monocytes Relative: 4 %
NEUTROS PCT: 85 %
Neutro Abs: 16.7 10*3/uL — ABNORMAL HIGH (ref 1.7–7.7)
Platelets: 328 10*3/uL (ref 150–400)
RBC: 3.62 MIL/uL — ABNORMAL LOW (ref 4.22–5.81)
RDW: 14.3 % (ref 11.5–15.5)
WBC: 19.7 10*3/uL — AB (ref 4.0–10.5)

## 2015-08-07 LAB — PROTIME-INR
INR: 1.27 (ref 0.00–1.49)
Prothrombin Time: 16.1 seconds — ABNORMAL HIGH (ref 11.6–15.2)

## 2015-08-07 LAB — ABO/RH: ABO/RH(D): B POS

## 2015-08-07 LAB — TYPE AND SCREEN
ABO/RH(D): B POS
ANTIBODY SCREEN: NEGATIVE

## 2015-08-07 LAB — APTT: APTT: 33 s (ref 24–37)

## 2015-08-07 MED ORDER — CLINDAMYCIN PHOSPHATE 900 MG/50ML IV SOLN
900.0000 mg | INTRAVENOUS | Status: AC
  Administered 2015-08-08: 900 mg via INTRAVENOUS
  Filled 2015-08-07 (×2): qty 50

## 2015-08-07 MED ORDER — HYDROCODONE-ACETAMINOPHEN 5-325 MG PO TABS
1.0000 | ORAL_TABLET | ORAL | Status: DC | PRN
  Administered 2015-08-07 – 2015-08-12 (×6): 1 via ORAL
  Filled 2015-08-07 (×7): qty 1

## 2015-08-07 MED ORDER — INSULIN GLARGINE 100 UNIT/ML ~~LOC~~ SOLN
25.0000 [IU] | Freq: Every day | SUBCUTANEOUS | Status: DC
  Administered 2015-08-07: 25 [IU] via SUBCUTANEOUS
  Filled 2015-08-07 (×2): qty 0.25

## 2015-08-07 MED ORDER — CEFEPIME HCL 1 G IJ SOLR
1.0000 g | Freq: Two times a day (BID) | INTRAMUSCULAR | Status: AC
Start: 2015-08-07 — End: 2015-08-07
  Administered 2015-08-07: 1 g via INTRAVENOUS
  Filled 2015-08-07: qty 1

## 2015-08-07 MED ORDER — INSULIN ASPART 100 UNIT/ML ~~LOC~~ SOLN
0.0000 [IU] | Freq: Three times a day (TID) | SUBCUTANEOUS | Status: DC
  Administered 2015-08-07: 1 [IU] via SUBCUTANEOUS
  Administered 2015-08-10: 2 [IU] via SUBCUTANEOUS
  Administered 2015-08-10: 1 [IU] via SUBCUTANEOUS
  Administered 2015-08-12 (×2): 2 [IU] via SUBCUTANEOUS
  Administered 2015-08-13: 3 [IU] via SUBCUTANEOUS
  Administered 2015-08-13 – 2015-08-14 (×2): 2 [IU] via SUBCUTANEOUS
  Administered 2015-08-14: 3 [IU] via SUBCUTANEOUS

## 2015-08-07 MED ORDER — CHLORHEXIDINE GLUCONATE 4 % EX LIQD
60.0000 mL | Freq: Once | CUTANEOUS | Status: AC
  Administered 2015-08-08: 4 via TOPICAL
  Filled 2015-08-07: qty 60

## 2015-08-07 MED ORDER — SODIUM POLYSTYRENE SULFONATE 15 GM/60ML PO SUSP
60.0000 g | Freq: Once | ORAL | Status: AC
Start: 2015-08-07 — End: 2015-08-07
  Administered 2015-08-07: 60 g via ORAL
  Filled 2015-08-07: qty 240

## 2015-08-07 MED ORDER — INSULIN ASPART 100 UNIT/ML ~~LOC~~ SOLN
0.0000 [IU] | Freq: Every day | SUBCUTANEOUS | Status: DC
  Administered 2015-08-11: 2 [IU] via SUBCUTANEOUS

## 2015-08-07 MED ORDER — CHLORHEXIDINE GLUCONATE 4 % EX LIQD: 60.0000 mL | Freq: Once | CUTANEOUS | Status: DC

## 2015-08-07 MED ORDER — DEXTROSE 5 % IV SOLN
2.0000 g | INTRAVENOUS | Status: DC
  Administered 2015-08-08 – 2015-08-14 (×7): 2 g via INTRAVENOUS
  Filled 2015-08-07 (×7): qty 2

## 2015-08-07 MED ORDER — PRO-STAT SUGAR FREE PO LIQD
30.0000 mL | Freq: Three times a day (TID) | ORAL | Status: DC
  Administered 2015-08-07: 30 mL via ORAL
  Filled 2015-08-07 (×10): qty 30

## 2015-08-07 NOTE — Progress Notes (Signed)
Pharmacy Antibiotic Note  Trevor Thompson is a 55 y.o. male admitted on 08/06/2015 with progressive deterioration of right foot wound with odor noted. PMH includes DM, L BKA, and s/p renal transplant. Given clinical history and transplant status, MD wanting to cover MRSA and pseudomonas. Continuing on vancomycin, cefepime, and flagyl per pharmacy D#2. Probable transmetatarsal amputation today. Afebrile, wbc 18.1. SCr 2.39, normalized CrCl~35.  Plan: Vancomycin 1250 mg IV q24 hr Adjust Cefepime to 2g IV q24h for renal function Flagyl 500 mg IV q8 hr Will likely need amputation 2/2 - f/u Surgery recs Monitor clinical progress, c/s, renal function, abx plan/LOT VT@SS  as indicated  Weight: 225 lb 15.5 oz (102.5 kg)  Temp (24hrs), Avg:99.4 F (37.4 C), Min:98.9 F (37.2 C), Max:99.8 F (37.7 C)   Recent Labs Lab 08/06/15 1208 08/06/15 1327  WBC 18.1*  --   CREATININE  --  2.39*    Estimated Creatinine Clearance: 42.6 mL/min (by C-G formula based on Cr of 2.39).    Allergies  Allergen Reactions  . Penicillins Other (See Comments)    Has patient had a PCN reaction causing immediate rash, facial/tongue/throat swelling, SOB or lightheadedness with hypotension: No Has patient had a PCN reaction causing severe rash involving mucus membranes or skin necrosis: No Has patient had a PCN reaction that required hospitalization. No Has patient had a PCN reaction occurring within the last 10 years: No If all of the above answers are "NO", then may proceed with Cephalosporin use.     Antimicrobials this admission: 2/1 vancomycin >>  2/1 aztreonam x 1 2/1 cefepime >>  2/1 metronidazole >>    Dose adjustments this admission:   Microbiology results: 2/1 BCx: IP (x1 only) 2/1 wound Cx: IP  Thank you for allowing pharmacy to be a part of this patient's care.  Bernadene Person, PharmD, BCPS Pager: [phone] 08/07/2015, 11:22 AM

## 2015-08-07 NOTE — Progress Notes (Signed)
PROGRESS NOTE  Trevor Thompson GGE:[id number] DOB: 1961-06-27 DOA: 08/06/2015 PCP: Minerva Ends, MD Brief History 55 y/o with a history of renal transplantation, peripheral vascular disease, hypertension, diabetes mellitus type 2, and left BKA presented with worsening diabetic foot infection. The patient recently had amputation of his right second toe in [place] in December 2016. He stated that the wound never healed, and he had been going to the wound care center on a daily basis. In the past 2 weeks, there has been increasing edema and drainage about his right foot. The patient states that he has not been placed on any antibiotics recently. The patient saw his orthopedist, Dr. Berenice Primas, in office approximately one week prior to admission and MRI was done at an outside facility. Assessment/Plan: Acute osteomyelitis right foot -positive probe to bone test with purulent drainage noted -appreciate ortho follow up -plans noted for possible R-TMA on 08/08/15 -PLEASE SEND INTRAOPERATIVE CULTURES IF FEASIBLE -ESR -CRP -Continue vancomycin, cefepime, metronidazole- -obtain MRI report CKD stage III with renal transplantation  -Nephrology consulted  -Continue tacrolimus and mycophenolate  -Continue maintenance prednisone  -Baseline creatinine 2.2-2.3  -Continue furosemide Diabetes mellitus type 2  -Hemoglobin A1c  -Continue Lantus  -Suspect may need to reduce dose after debridement/amputation as the patient's CBGs are elevated from his infection  -Reduced dose of Lantus as the patient will be nothing by mouth for surgery tomorrow -NovoLog sliding scale  Hyperlipidemia  -Continue statin    Family Communication:   Pt at beside Disposition Plan:   Home > 3 days pending intraoperative findings       Procedures/Studies: Portable Chest 1 View  08/06/2015  CLINICAL DATA:  55 year old male under preoperative evaluation prior to right foot amputation. EXAM: PORTABLE CHEST 1 VIEW  COMPARISON:  Chest x-ray 08/22/2014. FINDINGS: Linear scarring in the right mid lung is unchanged. No acute consolidative airspace disease. No pleural effusions. Chronic elevation of the right hemidiaphragm is unchanged. No evidence of pulmonary edema. Heart size is borderline enlarged. Upper mediastinal contours are within normal limits. Atherosclerosis in the thoracic aorta. IMPRESSION: 1. No radiographic evidence of acute cardiopulmonary disease. 2. Atherosclerosis. Electronically Signed   By: Vinnie Langton M.D.   On: 08/06/2015 17:01         Subjective: Patient denies fevers, chills, headache, chest pain, dyspnea, nausea, vomiting, diarrhea, abdominal pain, dysuria, hematuria   Objective: Filed Vitals:   08/06/15 1750 08/06/15 1943 08/06/15 2122 08/07/15 0426  BP: 103/67 126/69 125/74 124/59  Pulse: 91 87 101 111  Temp:   98.9 F (37.2 C) 99.5 F (37.5 C)  TempSrc: Other (Comment)  Oral Oral  Resp: 18 18 16 18   Weight:   102.5 kg (225 lb 15.5 oz)   SpO2: 95% 97% 99% 94%    Intake/Output Summary (Last 24 hours) at 08/07/15 0818 Last data filed at 08/07/15 0600  Gross per 24 hour  Intake   1600 ml  Output    850 ml  Net    750 ml   Weight change:  Exam:   General:  Pt is alert, follows commands appropriately, not in acute distress  HEENT: No icterus, No thrush, No neck mass, [place]/AT  Cardiovascular: RRR, S1/S2, no rubs, no gallops  Respiratory: CTA bilaterally, no wheezing, no crackles, no rhonchi  Abdomen: Soft/+BS, non tender, non distended, no guarding  Extremities: Right foot with purulent drainage from the second metatarsal head area. There is edema from the forefoot  extending to the ankle. Positive probe to bone.   Data Reviewed: Basic Metabolic Panel:  Recent Labs Lab 08/06/15 1327  NA 138  K 5.9*  CL 109  CO2 21*  GLUCOSE 164*  BUN 43*  CREATININE 2.39*  CALCIUM 9.2   Liver Function Tests:  Recent Labs Lab 08/06/15 1327  AST 11*  ALT  10*  ALKPHOS 59  BILITOT 0.6  PROT 6.7  ALBUMIN 2.4*   No results for input(s): LIPASE, AMYLASE in the last 168 hours. No results for input(s): AMMONIA in the last 168 hours. CBC:  Recent Labs Lab 08/06/15 1208  WBC 18.1*  NEUTROABS 14.1*  HGB 9.4*  HCT 30.2*  MCV 84.8  PLT 317   Cardiac Enzymes: No results for input(s): CKTOTAL, CKMB, CKMBINDEX, TROPONINI in the last 168 hours. BNP: Invalid input(s): POCBNP CBG:  Recent Labs Lab 08/06/15 1939 08/06/15 2230 08/06/15 2356 08/07/15 0425 08/07/15 0750  GLUCAP 150* 145* 136* 129* 121*    Recent Results (from the past 240 hour(s))  Wound culture     Status: None (Preliminary result)   Collection Time: 08/06/15 12:21 PM  Result Value Ref Range Status   Specimen Description WOUND RIGHT FOOT TOE  Final   Special Requests Normal  Final   Gram Stain PENDING  Incomplete   Culture NO GROWTH Performed at [facility]   Final   Report Status PENDING  Incomplete     Scheduled Meds: . ceFEPime (MAXIPIME) IV  1 g Intravenous Q12H  . furosemide  40 mg Oral Daily  . heparin  5,000 Units Subcutaneous 3 times per day  . insulin aspart  0-9 Units Subcutaneous 6 times per day  . insulin glargine  50 Units Subcutaneous QHS  . metronidazole  500 mg Intravenous Q8H  . mycophenolate  720 mg Oral BID  . predniSONE  5 mg Oral Q breakfast  . sodium chloride flush  3 mL Intravenous Q12H  . sodium polystyrene  30 g Oral Once  . tacrolimus  4 mg Oral BID  . vancomycin  1,250 mg Intravenous Q24H   Continuous Infusions: . sodium chloride 100 mL/hr at 08/07/15 0529     Tollie Canada, DO  [facility] Pager [phone]  If 7PM-7AM, please contact night-coverage [email or web address] Password [email or web address] 08/07/2015, 8:18 AM   LOS: 1 day

## 2015-08-07 NOTE — Consult Note (Signed)
Reason for Consult: Acute on chronic renal failure stage III T (patient with ESRD status post kidney transplant 13 years ago) Referring Physician: Catarina Hartshorn [facility])  HPI: 55 year old African-American man with past medical history significant for ESRD status post kidney transplant back in 2004 (cadaveric transplant done in [place]) with recent newly established renal baseline/higher creatinine following acute renal failure and he was admitted in [place] for sepsis. He also has peripheral vascular disease status post left below-knee amputation and presents with a nonhealing ulcer now with increased drainage/order from the site of the second toe of the right foot. Evaluation by MRI and physical exam reveals significant soft tissue extension/osteomyelitis and will require definitive management with a transmetatarsal amputation tomorrow.  He denies any fevers but reports occasional chills. Denies any nausea, vomiting or diarrhea. Denies any chest pain or shortness of breath.  Past Medical History  Diagnosis Date  . Hypertension   . PVD (peripheral vascular disease) (HCC)   . S/p cadaver renal transplant   . Cold intolerance   . 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 GERD (gastroesophageal reflux disease)   . 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 Hypertriglyceridemia   . Diabetic retinopathy (HCC)   . Sleep apnea     in the past .  Was cleared in 2004 to not wear CPap.  Dr in [place]  . High cholesterol   . Pneumonia 05/09/2013    "first time was today" (05/09/2013)  . Type II diabetes mellitus (HCC)   . ESRD (end stage renal disease) (HCC)     "no dialysis; had kidney transplant" (05/09/2013)    Past Surgical History  Procedure Laterality Date  . Kidney transplant Right 2004  . Cataract extraction w/ intraocular lens  implant, bilateral Bilateral ~ 1997  . Av fistula placement  1997-2004    "I've got 3; right upper arm; right forearm; left forearm "  . Av fistula repair   1997-2004    "multiple"  . Amputation  02/25/2012    Procedure: AMPUTATION DIGIT;  Surgeon: Larina Earthly, MD;  Location: [facility] OR;  Service: Vascular;  Laterality: Left;  Left 2nd Toe Amputation, Incision and Drainage left lateral foot.  . Amputation  03/22/2012    Procedure: AMPUTATION BELOW KNEE;  Surgeon: Nadara Mustard, MD;  Location: [facility] OR;  Service: Orthopedics;  Laterality: Left;  Left Below Knee Amputation  . Amputation  04/21/2012    Procedure: AMPUTATION BELOW KNEE;  Surgeon: Nadara Mustard, MD;  Location: [facility] OR;  Service: Orthopedics;  Laterality: Left;  Left below knee amputation revision  . Abdominal aortagram N/A 02/22/2012    Procedure: ABDOMINAL Ronny Flurry;  Surgeon: Nada Libman, MD;  Location: [facility] CATH LAB;  Service: Cardiovascular;  Laterality: N/A;    Family History  Problem Relation Age of Onset  . Diabetes Mother   . Diabetes Father   . Lupus Sister     Social History:  reports that he quit smoking about 34 years ago. His smoking use included Cigarettes. He has a .9 pack-year smoking history. He has never used smokeless tobacco. He reports that he drinks about 1.8 oz of alcohol per week. He reports that he does not use illicit drugs.  Allergies:  Allergies  Allergen Reactions  . Penicillins Other (See Comments)    Has patient had a PCN reaction causing immediate rash, facial/tongue/throat swelling, SOB or lightheadedness with hypotension: No Has patient had a PCN reaction causing severe rash involving mucus  membranes or skin necrosis: No Has patient had a PCN reaction that required hospitalization. No Has patient had a PCN reaction occurring within the last 10 years: No If all of the above answers are "NO", then may proceed with Cephalosporin use.     Medications:  Scheduled: . ceFEPime (MAXIPIME) IV  1 g Intravenous Q12H  . furosemide  40 mg Oral Daily  . heparin  5,000 Units Subcutaneous 3 times per day  . insulin aspart  0-9 Units Subcutaneous 6 times per day   . insulin glargine  25 Units Subcutaneous QHS  . metronidazole  500 mg Intravenous Q8H  . mycophenolate  720 mg Oral BID  . predniSONE  5 mg Oral Q breakfast  . sodium chloride flush  3 mL Intravenous Q12H  . sodium polystyrene  60 g Oral Once  . tacrolimus  4 mg Oral BID  . vancomycin  1,250 mg Intravenous Q24H    BMP Latest Ref Rng 08/06/2015 04/25/2015 03/06/2015  Glucose 65 - 99 mg/dL 409(W) 11(BJ) -  BUN 6 - 20 mg/dL 47(W) 29(F) -  Creatinine 0.61 - 1.24 mg/dL 6.21(H) 0.86(V) 7.84(O)  Sodium 135 - 145 mmol/L 138 142 -  Potassium 3.5 - 5.1 mmol/L 5.9(H) 5.1 -  Chloride 101 - 111 mmol/L 109 112(H) -  CO2 22 - 32 mmol/L 21(L) 21 -  Calcium 8.9 - 10.3 mg/dL 9.2 9.1 -   CBC Latest Ref Rng 08/06/2015 04/25/2015 10/10/2014  WBC 4.0 - 10.5 K/uL 18.1(H) 10.7(H) 9.7  Hemoglobin 13.0 - 17.0 g/dL 9.6(E) 11.5(L) 11.8(L)  Hematocrit 39.0 - 52.0 % 30.2(L) 37.5(L) 38.5(L)  Platelets 150 - 400 K/uL 317 188 224      Portable Chest 1 View  08/06/2015  CLINICAL DATA:  55 year old male under preoperative evaluation prior to right foot amputation. EXAM: PORTABLE CHEST 1 VIEW COMPARISON:  Chest x-ray 08/22/2014. FINDINGS: Linear scarring in the right mid lung is unchanged. No acute consolidative airspace disease. No pleural effusions. Chronic elevation of the right hemidiaphragm is unchanged. No evidence of pulmonary edema. Heart size is borderline enlarged. Upper mediastinal contours are within normal limits. Atherosclerosis in the thoracic aorta. IMPRESSION: 1. No radiographic evidence of acute cardiopulmonary disease. 2. Atherosclerosis. Electronically Signed   By: Trudie Reed M.D.   On: 08/06/2015 17:01    Review of Systems  Constitutional: Positive for chills and malaise/fatigue. Negative for fever and weight loss.  HENT: Negative.   Eyes: Negative.   Respiratory: Negative.   Cardiovascular: Negative.   Gastrointestinal: Negative.   Genitourinary: Negative.   Musculoskeletal:       Pain  and discomfort over right leg  Skin: Negative.   Neurological: Negative.  Negative for weakness.   Blood pressure 124/59, pulse 111, temperature 99.5 F (37.5 C), temperature source Oral, resp. rate 18, weight 102.5 kg (225 lb 15.5 oz), SpO2 94 %. Physical Exam  Nursing note and vitals reviewed. Constitutional: He is oriented to person, place, and time. He appears well-developed and well-nourished.  HENT:  Head: Normocephalic and atraumatic.  Nose: Nose normal.  Mouth/Throat: Oropharynx is clear and moist.  Eyes: EOM are normal. Pupils are equal, round, and reactive to light. No scleral icterus.  Neck: Normal range of motion. Neck supple. No JVD present.  Cardiovascular: Regular rhythm.   Murmur heard. Regular tachycardia with 2/6 ejection systolic murmur  Respiratory: Effort normal and breath sounds normal. He has no wheezes. He has no rales.  GI: Soft. Bowel sounds are normal. He exhibits no distension.  There is no tenderness.  Musculoskeletal: He exhibits no edema.  Right leg with purulent malodorous drainage from nonhealing second toe wound. Remaining lateral toes appear to be dusky and with poor capillary refill. Foot appears to be cool to touch.  Status post left BKA.  Neurological: He is alert and oriented to person, place, and time.  Skin: Skin is warm and dry. No rash noted. No erythema.  Psychiatric: He has a normal mood and affect.    Assessment/Plan: 1. Right foot osteomyelitis/nonhealing ulcer status post second toe amputation: Awaiting surgical intervention with transmetatarsal amputation tomorrow for definitive management given the extent of infection from MRI findings. 2. Chronic kidney disease stage III T (status post kidney transplant 13 years ago in [place] and recent acute on chronic renal failure with higher baseline). Agree with continuing intravenous fluids and medical management of his hyperkalemia-continue antirejection therapy with Prograf and CellCept as  well as prednisone. 3. Hyperkalemia: No clear source, not on potassium supplement/RAS blockers, treat medically with forced diuresis/intravenous fluids and Kayexalate. 4. Anemia: Anemia of chronic kidney disease/chronic infection/inflammation. Check iron studies and consider ESA. 5. Hypertension: Blood pressures appear to be satisfactorily controlled at this point, continue to monitor closely.  Climmie Buelow K. 08/07/2015, 10:40 AM

## 2015-08-07 NOTE — Progress Notes (Signed)
Patient has $137, credit cards and driver's license. Counted with patient and RN [employer]. Patient asked if he will like money to be sent to safe, patient states he will keep his wallet at the bedside.

## 2015-08-07 NOTE — Consult Note (Signed)
Reason for Consult:severe infection right foot Referring Physician: hospitalists  Trevor Thompson is an 55 y.o. male.  HPI: the patient is a 14 girl male who underwent amputation of the second toe in December.  He unfortunately never healed his wound and had a large portal of entry for bacteria.  He was being treated in the wound Center and he was sent to me for evaluation several weeks ago.  We get an MRI to see what exactly was going on to see if there was potential for salvage of the foot.  I have not seen him back in the office was unfortunately he presented with a worsening and severe infection of the foot.  He was evaluated in the emergency room and we were consulted.  Review of his old MRI shows that he had some edema and infection around the extensor tendons of the third fourth and somewhat into the fifth toes.  There was some concern for a fracture as well.  There is no obvious evidence of osteomyelitis.  Past Medical History  Diagnosis Date  . Hypertension   . PVD (peripheral vascular disease) ([place])   . S/p cadaver renal transplant   . Cold intolerance   . Autonomic neuropathy due to diabetes ([place])   . GERD (gastroesophageal reflux disease)   . 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 Hypertriglyceridemia   . Diabetic retinopathy ([place])   . Sleep apnea     in the past .  Was cleared in 2004 to not wear CPap.  Dr in [place]  . High cholesterol   . Pneumonia 05/09/2013    "first time was today" (05/09/2013)  . Type II diabetes mellitus ([place])   . ESRD (end stage renal disease) ([place])     "no dialysis; had kidney transplant" (05/09/2013)    Past Surgical History  Procedure Laterality Date  . Kidney transplant Right 2004  . Cataract extraction w/ intraocular lens  implant, bilateral Bilateral ~ 1997  . Av fistula placement  1997-2004    "I've got 3; right upper arm; right forearm; left forearm "  . Av fistula repair  1997-2004    "multiple"  . Amputation  02/25/2012   Procedure: AMPUTATION DIGIT;  Surgeon: Rosetta Posner, MD;  [facility];  Service: Vascular;  Laterality: Left;  Left 2nd Toe Amputation, Incision and Drainage left lateral foot.  . Amputation  03/22/2012    Procedure: AMPUTATION BELOW KNEE;  Surgeon: Newt Minion, MD;  Location: [place];  Service: Orthopedics;  Laterality: Left;  Left Below Knee Amputation  . Amputation  04/21/2012    Procedure: AMPUTATION BELOW KNEE;  Surgeon: Newt Minion, MD;  Location: [place];  Service: Orthopedics;  Laterality: Left;  Left below knee amputation revision  . Abdominal aortagram N/A 02/22/2012    Procedure: ABDOMINAL Maxcine Ham;  Surgeon: Serafina Mitchell, MD;  Location: [facility] CATH LAB;  Service: Cardiovascular;  Laterality: N/A;    Family History  Problem Relation Age of Onset  . Diabetes Mother   . Diabetes Father   . Lupus Sister     Social History:  reports that he quit smoking about 34 years ago. His smoking use included Cigarettes. He has a .9 pack-year smoking history. He has never used smokeless tobacco. He reports that he drinks about 1.8 oz of alcohol per week. He reports that he does not use illicit drugs.  Allergies:  Allergies  Allergen Reactions  . Penicillins Other (See Comments)    Has patient  had a PCN reaction causing immediate rash, facial/tongue/throat swelling, SOB or lightheadedness with hypotension: No Has patient had a PCN reaction causing severe rash involving mucus membranes or skin necrosis: No Has patient had a PCN reaction that required hospitalization. No Has patient had a PCN reaction occurring within the last 10 years: No If all of the above answers are "NO", then may proceed with Cephalosporin use.     Medications: I have reviewed the patient's current medications.  Results for orders placed or performed during the hospital encounter of 08/06/15 (from the past 48 hour(s))  CBG monitoring, ED     Status: Abnormal   Collection Time: 08/06/15 10:37 AM  Result Value Ref  Range   Glucose-Capillary 152 (H) 65 - 99 mg/dL  CBC with Differential/Platelet     Status: Abnormal   Collection Time: 08/06/15 12:08 PM  Result Value Ref Range   WBC 18.1 (H) 4.0 - 10.5 K/uL   RBC 3.56 (L) 4.22 - 5.81 MIL/uL   Hemoglobin 9.4 (L) 13.0 - 17.0 g/dL   HCT 30.2 (L) 39.0 - 52.0 %   MCV 84.8 78.0 - 100.0 fL   MCH 26.4 26.0 - 34.0 pg   MCHC 31.1 30.0 - 36.0 g/dL   RDW 14.2 11.5 - 15.5 %   Platelets 317 150 - 400 K/uL   Neutrophils Relative % 78 %   Lymphocytes Relative 7 %   Monocytes Relative 14 %   Eosinophils Relative 1 %   Basophils Relative 0 %   Neutro Abs 14.1 (H) 1.7 - 7.7 K/uL   Lymphs Abs 1.3 0.7 - 4.0 K/uL   Monocytes Absolute 2.5 (H) 0.1 - 1.0 K/uL   Eosinophils Absolute 0.2 0.0 - 0.7 K/uL   Basophils Absolute 0.0 0.0 - 0.1 K/uL   Smear Review MORPHOLOGY UNREMARKABLE   Comprehensive metabolic panel     Status: Abnormal   Collection Time: 08/06/15  1:27 PM  Result Value Ref Range   Sodium 138 135 - 145 mmol/L   Potassium 5.9 (H) 3.5 - 5.1 mmol/L   Chloride 109 101 - 111 mmol/L   CO2 21 (L) 22 - 32 mmol/L   Glucose, Bld 164 (H) 65 - 99 mg/dL   BUN 43 (H) 6 - 20 mg/dL   Creatinine, Ser 2.39 (H) 0.61 - 1.24 mg/dL   Calcium 9.2 8.9 - 10.3 mg/dL   Total Protein 6.7 6.5 - 8.1 g/dL   Albumin 2.4 (L) 3.5 - 5.0 g/dL   AST 11 (L) 15 - 41 U/L   ALT 10 (L) 17 - 63 U/L   Alkaline Phosphatase 59 38 - 126 U/L   Total Bilirubin 0.6 0.3 - 1.2 mg/dL   GFR calc non Af Amer 29 (L) >60 mL/min   GFR calc Af Amer 33 (L) >60 mL/min    Comment: (NOTE) The eGFR has been calculated using the CKD EPI equation. This calculation has not been validated in all clinical situations. eGFR's persistently <60 mL/min signify possible Chronic Kidney Disease.    Anion gap 8 5 - 15  CBG monitoring, ED     Status: Abnormal   Collection Time: 08/06/15  4:45 PM  Result Value Ref Range   Glucose-Capillary 106 (H) 65 - 99 mg/dL  CBG monitoring, ED     Status: Abnormal   Collection  Time: 08/06/15  7:39 PM  Result Value Ref Range   Glucose-Capillary 150 (H) 65 - 99 mg/dL   Comment 1 Notify RN    Comment  2 Document in Chart   Glucose, capillary     Status: Abnormal   Collection Time: 08/06/15 10:30 PM  Result Value Ref Range   Glucose-Capillary 145 (H) 65 - 99 mg/dL  Glucose, capillary     Status: Abnormal   Collection Time: 08/06/15 11:56 PM  Result Value Ref Range   Glucose-Capillary 136 (H) 65 - 99 mg/dL  Glucose, capillary     Status: Abnormal   Collection Time: 08/07/15  4:25 AM  Result Value Ref Range   Glucose-Capillary 129 (H) 65 - 99 mg/dL    Portable Chest 1 View  08/06/2015  CLINICAL DATA:  55 year old male under preoperative evaluation prior to right foot amputation. EXAM: PORTABLE CHEST 1 VIEW COMPARISON:  Chest x-ray 08/22/2014. FINDINGS: Linear scarring in the right mid lung is unchanged. No acute consolidative airspace disease. No pleural effusions. Chronic elevation of the right hemidiaphragm is unchanged. No evidence of pulmonary edema. Heart size is borderline enlarged. Upper mediastinal contours are within normal limits. Atherosclerosis in the thoracic aorta. IMPRESSION: 1. No radiographic evidence of acute cardiopulmonary disease. 2. Atherosclerosis. Electronically Signed   By: Vinnie Langton M.D.   On: 08/06/2015 17:01    ROS  ROS: I have reviewed the patient's review of systems thoroughly and there are no positive responses as relates to the HPI. EXAM Blood pressure 124/59, pulse 111, temperature 99.5 F (37.5 C), temperature source Oral, resp. rate 18, weight 102.5 kg (225 lb 15.5 oz), SpO2 94 %. Physical Exam Well-developed well-nourished patient in no acute distress. Alert and oriented x3 HEENT:within normal limits Cardiac: Regular rate and rhythm Pulmonary: Lungs clear to auscultation Abdomen: Soft and nontender.  Normal active bowel sounds  Musculoskeletal: (left lower extremity has a well-healed BKA wound.  Right foot has severe  evidence of infection with necrosis of skin and skin breakdown over the third fourth and fifth toes.  There is warmth and erythema and frank pus in these areas. Assessment/Plan: 55 year old male with severe infection of the right foot.  There is obvious evidence of significant pus involving the third fourth and fifth toes.  His previous MRI would suggest that there was already infection in the tendon sheaths of the third toe.  At this point I think that he has overwhelming and significant infection of the third fourth and fifth toes as well as involvement of the metatarsophalangeal joints in this area.//He is admitted to the medical service and placed on broad-spectrum IV antibiotic therapy.  I talked to the patient that I do not think that we will be up to salvage his toes.  There is a possibility that he could heal a transmetatarsal amputation and I think this would be the appropriate course of action for the patient.  I want to get his infection calm down over the next days and plan on surgical intervention Friday afternoon.  If the wound looks contaminated after amputation and plan on leaving the wound open with either a wound VAC or moist sponge with return to the operating room early the following week for closure.  If the wound looks clean after amputation I would proceed with closure over a drain and continued IV antibiotic therapy.  Trevor Thompson L 08/07/2015, 6:59 AM

## 2015-08-07 NOTE — Progress Notes (Signed)
Initial Nutrition Assessment  DOCUMENTATION CODES:   Severe malnutrition in context of chronic illness, Obesity unspecified  INTERVENTION:  -Provide Prostat TID, 100 kcal , 15 g of protein  -Encourage PO intake   NUTRITION DIAGNOSIS:   Malnutrition related to poor appetite as evidenced by energy intake < 75% for > or equal to 1 month, percent weight loss.  GOAL:   Patient will meet greater than or equal to 90% of their needs  MONITOR:   PO intake, Supplement acceptance, Weight trends, Labs  REASON FOR ASSESSMENT:   Malnutrition Screening Tool  ASSESSMENT:   Patient is a 55 year old male with diabetes, peripheral last year disease status post renal transplant left BKA presenting with right foot wound. This right foot wound has been in various stages of healing for the last 2 months. Patient is being followed by podiatry and Dr. Luiz Blare from [facility] orthopedics. Patient has had 2 amputations of 2 toes on his right foot. He went to his appointment with podiatry and was found to have worsening wound healing and was sent here for evaluation  Pt seen today for MST. Pt states his appetite is "not good". Poor appetite has been going on for a month. PTA he was only consuming 1 meal a day, this has been his norm for ~3 months. Pt states he does not like oral nutrition supplements, such as boost. He dislikes the milky taste of these products. Pt is agreeable to trying Prostat.   Pt reports weight loss of 20 lb but does not provide a time frame. Per chart pt has lost 29 lb (11%) in 4 months, which is significant for time frame. Conducted nutrition focused physical exam, no signs of muscle or fat wasting. Pt is severely malnourished in the context in chronic illness, due to poor PO and significant weight loss.   Medications reviewed. Labs Reviewed: Potassium 5.9 mmol/L  Diet Order:  Diet Heart Room service appropriate?: Yes; Fluid consistency:: Thin  Skin:  Wound (see comment) (Diabetic toe  ulcer, right)  Last BM:  08/05/2015  Height:   Ht Readings from Last 1 Encounters:  04/25/15  (1.803 m)    Weight:   Wt Readings from Last 1 Encounters:  08/06/15 225 lb 15.5 oz (102.5 kg)    Ideal Body Weight:  73.1 kg  BMI:  Body mass index is 31.53 kg/(m^2).  Estimated Nutritional Needs:   Kcal:  2000-2200  Protein:  125-135  Fluid:  >/= 2 L  EDUCATION NEEDS:   No education needs identified at this time  Brynda Rim, Dietetic Intern Pager: [phone]

## 2015-08-07 NOTE — Progress Notes (Signed)
New Admission Note:  Arrival Method: via stretcher with [facility] Mental Orientation: Alert and oriented x4 Telemetry: box 12, CCMD notified Assessment: Completed Skin: wound on right toes (see documentation) IV: Pain: 5/10, refused pain med Tubes: n/a Safety Measures: Safety Fall Prevention Plan discussed. Admission: Completed [address] [place] Orientation: Patient has been orientated to the room, unit and the staff. Family: none at bedside  Orders have been reviewed and implemented. Will continue to monitor the patient. Call light has been placed within reach and bed alarm has been activated.   Tempie Donning BSN, RN  Phone Number: [phone] [facility] [place] Med/Surg-Renal Unit

## 2015-08-08 ENCOUNTER — Encounter (HOSPITAL_COMMUNITY): Payer: Self-pay | Admitting: Anesthesiology

## 2015-08-08 ENCOUNTER — Inpatient Hospital Stay (HOSPITAL_COMMUNITY): Payer: Medicare Other

## 2015-08-08 ENCOUNTER — Inpatient Hospital Stay (HOSPITAL_COMMUNITY): Payer: Medicare Other | Admitting: Anesthesiology

## 2015-08-08 ENCOUNTER — Encounter (HOSPITAL_COMMUNITY)
Admission: EM | Disposition: A | Discharge status: Home Health | Payer: Self-pay | Source: Home / Self Care | Attending: Internal Medicine

## 2015-08-08 DIAGNOSIS — A419 Sepsis, unspecified organism: Secondary | ICD-10-CM

## 2015-08-08 DIAGNOSIS — E43 Unspecified severe protein-calorie malnutrition: Secondary | ICD-10-CM

## 2015-08-08 DIAGNOSIS — T380X1A Poisoning by glucocorticoids and synthetic analogues, accidental (unintentional), initial encounter: Secondary | ICD-10-CM

## 2015-08-08 DIAGNOSIS — D848 Other specified immunodeficiencies: Secondary | ICD-10-CM

## 2015-08-08 HISTORY — PX: AMPUTATION: SHX166

## 2015-08-08 LAB — GLUCOSE, CAPILLARY
GLUCOSE-CAPILLARY: 67 mg/dL (ref 65–99)
GLUCOSE-CAPILLARY: 99 mg/dL (ref 65–99)
GLUCOSE-CAPILLARY: 126 mg/dL — AB (ref 65–99)
GLUCOSE-CAPILLARY: 112 mg/dL — AB (ref 65–99)
GLUCOSE-CAPILLARY: 111 mg/dL — AB (ref 65–99)
GLUCOSE-CAPILLARY: 116 mg/dL — AB (ref 65–99)
Glucose-Capillary: 114 mg/dL — ABNORMAL HIGH (ref 65–99)
Glucose-Capillary: 66 mg/dL (ref 65–99)

## 2015-08-08 LAB — BASIC METABOLIC PANEL
ANION GAP: 12 (ref 5–15)
BUN: 33 mg/dL — ABNORMAL HIGH (ref 6–20)
CALCIUM: 8.7 mg/dL — AB (ref 8.9–10.3)
CO2: 17 mmol/L — ABNORMAL LOW (ref 22–32)
Chloride: 111 mmol/L (ref 101–111)
Creatinine, Ser: 2.21 mg/dL — ABNORMAL HIGH (ref 0.61–1.24)
GFR, EST AFRICAN AMERICAN: 37 mL/min — AB (ref >60)
GFR, EST NON AFRICAN AMERICAN: 32 mL/min — AB (ref >60)
GLUCOSE: 73 mg/dL (ref 65–99)
POTASSIUM: 5 mmol/L (ref 3.5–5.1)
Sodium: 140 mmol/L (ref 135–145)

## 2015-08-08 LAB — CBC
HCT: 26.4 % — ABNORMAL LOW (ref 39.0–52.0)
HEMOGLOBIN: 8.2 g/dL — AB (ref 13.0–17.0)
MCH: 26.3 pg (ref 26.0–34.0)
MCHC: 31.1 g/dL (ref 30.0–36.0)
MCV: 84.6 fL (ref 78.0–100.0)
PLATELETS: 296 10*3/uL (ref 150–400)
RBC: 3.12 MIL/uL — AB (ref 4.22–5.81)
RDW: 14.6 % (ref 11.5–15.5)
WBC: 22.6 10*3/uL — AB (ref 4.0–10.5)

## 2015-08-08 LAB — SURGICAL PCR SCREEN
MRSA, PCR: NEGATIVE
Staphylococcus aureus: NEGATIVE

## 2015-08-08 LAB — WOUND CULTURE
Gram Stain: NONE SEEN
Special Requests: NORMAL

## 2015-08-08 LAB — IRON AND TIBC
IRON: 9 ug/dL — AB (ref 45–182)
Saturation Ratios: 8 % — ABNORMAL LOW (ref 17.9–39.5)
TIBC: 113 ug/dL — AB (ref 250–450)
UIBC: 104 ug/dL

## 2015-08-08 LAB — FERRITIN: FERRITIN: 1134 ng/mL — AB (ref 24–336)

## 2015-08-08 SURGERY — AMPUTATION, FOOT, PARTIAL
Anesthesia: Monitor Anesthesia Care | Laterality: Right

## 2015-08-08 MED ORDER — PROPOFOL 500 MG/50ML IV EMUL
INTRAVENOUS | Status: DC | PRN
  Administered 2015-08-08: 75 ug/kg/min via INTRAVENOUS

## 2015-08-08 MED ORDER — MIDAZOLAM HCL 2 MG/2ML IJ SOLN
1.0000 mg | INTRAMUSCULAR | Status: DC | PRN
  Administered 2015-08-08: 1 mg via INTRAVENOUS

## 2015-08-08 MED ORDER — FENTANYL CITRATE (PF) 100 MCG/2ML IJ SOLN
INTRAMUSCULAR | Status: AC
  Administered 2015-08-08: 50 ug via INTRAVENOUS
  Filled 2015-08-08: qty 2

## 2015-08-08 MED ORDER — SODIUM CHLORIDE 0.9 % IV SOLN
INTRAVENOUS | Status: DC
  Administered 2015-08-08 – 2015-08-11 (×2): via INTRAVENOUS

## 2015-08-08 MED ORDER — LIDOCAINE-EPINEPHRINE (PF) 1.5 %-1:200000 IJ SOLN
INTRAMUSCULAR | Status: DC | PRN
  Administered 2015-08-08: 10 mL via PERINEURAL

## 2015-08-08 MED ORDER — MIDAZOLAM HCL 5 MG/5ML IJ SOLN
INTRAMUSCULAR | Status: DC | PRN
  Administered 2015-08-08: 2 mg via INTRAVENOUS

## 2015-08-08 MED ORDER — ATORVASTATIN CALCIUM 20 MG PO TABS
20.0000 mg | ORAL_TABLET | Freq: Every day | ORAL | Status: DC
  Administered 2015-08-09 – 2015-08-14 (×5): 20 mg via ORAL
  Filled 2015-08-08 (×6): qty 1

## 2015-08-08 MED ORDER — FENTANYL CITRATE (PF) 250 MCG/5ML IJ SOLN
INTRAMUSCULAR | Status: AC
  Filled 2015-08-08: qty 5

## 2015-08-08 MED ORDER — INSULIN GLARGINE 100 UNIT/ML ~~LOC~~ SOLN
20.0000 [IU] | Freq: Every day | SUBCUTANEOUS | Status: DC
  Administered 2015-08-09 – 2015-08-14 (×5): 20 [IU] via SUBCUTANEOUS
  Filled 2015-08-08 (×6): qty 0.2

## 2015-08-08 MED ORDER — MIDAZOLAM HCL 2 MG/2ML IJ SOLN
INTRAMUSCULAR | Status: AC
  Filled 2015-08-08: qty 2

## 2015-08-08 MED ORDER — DEXTROSE 50 % IV SOLN
INTRAVENOUS | Status: AC
  Administered 2015-08-08: 50 mL
  Filled 2015-08-08: qty 50

## 2015-08-08 MED ORDER — HEPARIN SODIUM (PORCINE) 5000 UNIT/ML IJ SOLN
5000.0000 [IU] | Freq: Three times a day (TID) | INTRAMUSCULAR | Status: DC
  Administered 2015-08-09 – 2015-08-11 (×7): 5000 [IU] via SUBCUTANEOUS
  Filled 2015-08-08 (×5): qty 1

## 2015-08-08 MED ORDER — STERILE WATER FOR INJECTION IV SOLN
INTRAVENOUS | Status: DC
  Administered 2015-08-08: 12:00:00 via INTRAVENOUS
  Filled 2015-08-08 (×5): qty 850

## 2015-08-08 MED ORDER — INSULIN GLARGINE 100 UNIT/ML ~~LOC~~ SOLN
20.0000 [IU] | Freq: Every day | SUBCUTANEOUS | Status: DC
  Filled 2015-08-08: qty 0.2

## 2015-08-08 MED ORDER — PHENYLEPHRINE HCL 10 MG/ML IJ SOLN
10.0000 mg | INTRAMUSCULAR | Status: DC | PRN
  Administered 2015-08-08: 25 ug/min via INTRAVENOUS

## 2015-08-08 MED ORDER — CALCITRIOL 0.5 MCG PO CAPS
0.5000 ug | ORAL_CAPSULE | Freq: Every day | ORAL | Status: DC
  Administered 2015-08-08 – 2015-08-14 (×7): 0.5 ug via ORAL
  Filled 2015-08-08 (×6): qty 1

## 2015-08-08 MED ORDER — BUPIVACAINE-EPINEPHRINE (PF) 0.5% -1:200000 IJ SOLN
INTRAMUSCULAR | Status: DC | PRN
  Administered 2015-08-08: 30 mL via PERINEURAL

## 2015-08-08 MED ORDER — LACTATED RINGERS IV SOLN: INTRAVENOUS | Status: DC

## 2015-08-08 MED ORDER — FENTANYL CITRATE (PF) 100 MCG/2ML IJ SOLN
50.0000 ug | INTRAMUSCULAR | Status: DC | PRN
  Administered 2015-08-08: 50 ug via INTRAVENOUS

## 2015-08-08 MED ORDER — HYDROMORPHONE HCL 1 MG/ML IJ SOLN: 0.2500 mg | INTRAMUSCULAR | Status: DC | PRN

## 2015-08-08 MED ORDER — MIDAZOLAM HCL 2 MG/2ML IJ SOLN
INTRAMUSCULAR | Status: AC
  Administered 2015-08-08: 1 mg via INTRAVENOUS
  Filled 2015-08-08: qty 2

## 2015-08-08 SURGICAL SUPPLY — 40 items
BANDAGE ELASTIC 6 VELCRO ST LF (GAUZE/BANDAGES/DRESSINGS) ×3 IMPLANT
BNDG GAUZE ELAST 4 BULKY (GAUZE/BANDAGES/DRESSINGS) IMPLANT
BOOTCOVER CLEANROOM LRG (PROTECTIVE WEAR) ×6 IMPLANT
COVER SURGICAL LIGHT HANDLE (MISCELLANEOUS) ×3 IMPLANT
CUFF TOURNIQUET SINGLE 18IN (TOURNIQUET CUFF) ×3 IMPLANT
CUFF TOURNIQUET SINGLE 34IN LL (TOURNIQUET CUFF) IMPLANT
CUFF TOURNIQUET SINGLE 44IN (TOURNIQUET CUFF) IMPLANT
DURAPREP 26ML APPLICATOR (WOUND CARE) ×3 IMPLANT
GAUZE SPONGE 4X4 12PLY STRL (GAUZE/BANDAGES/DRESSINGS) ×3 IMPLANT
GAUZE XEROFORM 5X9 LF (GAUZE/BANDAGES/DRESSINGS) ×3 IMPLANT
GLOVE BIOGEL PI IND STRL 8 (GLOVE) ×2 IMPLANT
GLOVE BIOGEL PI INDICATOR 8 (GLOVE) ×4
GLOVE ECLIPSE 7.5 STRL STRAW (GLOVE) ×6 IMPLANT
GOWN STRL REUS W/ TWL LRG LVL3 (GOWN DISPOSABLE) ×1 IMPLANT
GOWN STRL REUS W/ TWL XL LVL3 (GOWN DISPOSABLE) ×2 IMPLANT
GOWN STRL REUS W/TWL LRG LVL3 (GOWN DISPOSABLE) ×2
GOWN STRL REUS W/TWL XL LVL3 (GOWN DISPOSABLE) ×4
HANDPIECE INTERPULSE COAX TIP (DISPOSABLE) ×2
KIT BASIN OR (CUSTOM PROCEDURE TRAY) ×3 IMPLANT
KIT ROOM TURNOVER OR (KITS) ×3 IMPLANT
MANIFOLD NEPTUNE II (INSTRUMENTS) ×3 IMPLANT
NS IRRIG 1000ML POUR BTL (IV SOLUTION) ×3 IMPLANT
PACK ORTHO EXTREMITY (CUSTOM PROCEDURE TRAY) ×3 IMPLANT
PAD ARMBOARD 7.5X6 YLW CONV (MISCELLANEOUS) ×6 IMPLANT
PAD CAST 4YDX4 CTTN HI CHSV (CAST SUPPLIES) ×1 IMPLANT
PADDING CAST COTTON 4X4 STRL (CAST SUPPLIES) ×2
SET HNDPC FAN SPRY TIP SCT (DISPOSABLE) ×1 IMPLANT
SOLUTION BETADINE 4OZ (MISCELLANEOUS) ×3 IMPLANT
SPONGE SCRUB IODOPHOR (GAUZE/BANDAGES/DRESSINGS) ×3 IMPLANT
SUT ETHILON 3 0 PS 1 (SUTURE) ×6 IMPLANT
SUT SILK 1 TIES 10X30 (SUTURE) ×3 IMPLANT
SUT SILK 2 0 SH CR/8 (SUTURE) ×3 IMPLANT
SUT SILK 2 0 TIES 10X30 (SUTURE) IMPLANT
SUT VIC AB 0 CTB1 27 (SUTURE) ×3 IMPLANT
SUT VIC AB 1 CTB1 27 (SUTURE) ×6 IMPLANT
SUT VIC AB 2-0 CTB1 (SUTURE) ×3 IMPLANT
TOWEL OR 17X24 6PK STRL BLUE (TOWEL DISPOSABLE) ×3 IMPLANT
TOWEL OR 17X26 10 PK STRL BLUE (TOWEL DISPOSABLE) ×3 IMPLANT
UNDERPAD 30X30 INCONTINENT (UNDERPADS AND DIAPERS) ×3 IMPLANT
WATER STERILE IRR 1000ML POUR (IV SOLUTION) ×3 IMPLANT

## 2015-08-08 NOTE — Progress Notes (Signed)
PROGRESS NOTE  Trevor Thompson PRX:[id number] DOB: [birth year] DOA: 08/06/2015 PCP: Minerva Ends, MD  Brief History 55 y/o with a history of renal transplantation, peripheral vascular disease, hypertension, diabetes mellitus type 2, and left BKA presented with worsening diabetic foot infection. The patient recently had amputation of his right second toe in [place] in December 2016. He stated that the wound never healed, and he had been going to the wound care center on a daily basis. In the past 2 weeks, there has been increasing edema and drainage about his right foot. The patient states that he has not been placed on any antibiotics recently. The patient saw his orthopedist, Dr. Berenice Primas, in office approximately one week prior to admission and MRI was done at an outside facility. Assessment/Plan: Sepsis -present at the time of admission -pt had tachycardia HR>110 and WBC 18.1 -secondary to foot infection -pt had fluids at 150cc/hr Acute osteomyelitis right foot -positive probe to bone test with purulent drainage noted -appreciate ortho follow up -R-TMA on 08/08/15 -08/01/2015 MRI R- foot--abnormal for collection adjacent to the second, third, and fourth distal metatarsals; also with abnormal edema in the extensor tendon sheaths of the fourth and fifth metatarsals;  it was felt that the fluid collections likely represented pus; no frank radiographic features of osteomyelitis -ESR -CRP -Continue vancomycin, cefepime, metronidazole CKD stage III with renal transplantation  -Appreciate Nephrology -status post kidney transplant 13 years ago in [place]  -Continue tacrolimus and mycophenolate  -Continue maintenance prednisone  -Baseline creatinine 2.2-2.3  -Continue furosemide -bicarbonate drip per renal due to metabolic acidosis Diabetes mellitus type 2  -Hemoglobin A1c--pending -Continue Lantus-->reduce dose -Suspect may need to reduce dose after debridement/amputation  as the patient's CBGs are elevated from his infection  -NovoLog sliding scale  Hyperlipidemia  -Continue statin  Hyperkalemia -improved with fluids/kayexalate Severe protein calorie malnutrition -Nutritional supplementation Family Communication: Pt at beside Disposition Plan: Home 2-3 days   Procedures/Studies: Portable Chest 1 View  08/06/2015  CLINICAL DATA:  55 year old male under preoperative evaluation prior to right foot amputation. EXAM: PORTABLE CHEST 1 VIEW COMPARISON:  Chest x-ray 08/22/2014. FINDINGS: Linear scarring in the right mid lung is unchanged. No acute consolidative airspace disease. No pleural effusions. Chronic elevation of the right hemidiaphragm is unchanged. No evidence of pulmonary edema. Heart size is borderline enlarged. Upper mediastinal contours are within normal limits. Atherosclerosis in the thoracic aorta. IMPRESSION: 1. No radiographic evidence of acute cardiopulmonary disease. 2. Atherosclerosis. Electronically Signed   By: Vinnie Langton M.D.   On: 08/06/2015 17:01         Subjective: Patient complains of pain in the right foot. Otherwise, denies fevers, chest, chest pain, shortness of breath, nausea, vomiting, diarrhea, abdominal pain, dysuria, hematuria. No headache or neck pain.   Objective: Filed Vitals:   08/08/15 1325 08/08/15 1330 08/08/15 1335 08/08/15 1513  BP: 167/80 110/46 148/71   Pulse: 98 96 98   Temp:    98.4 F (36.9 C)  TempSrc:      Resp: 15 10 21    Height:      Weight:      SpO2: 100% 95% 100%     Intake/Output Summary (Last 24 hours) at 08/08/15 1525 Last data filed at 08/08/15 1505  Gross per 24 hour  Intake    740 ml  Output     50 ml  Net    690 ml   Weight change:  Exam:  General:  Pt is alert, follows commands appropriately, not in acute distress  HEENT: No icterus, No thrush, No neck mass, [place]/AT  Cardiovascular: RRR, S1/S2, no rubs, no gallops  Respiratory: CTA bilaterally, no wheezing, no  crackles, no rhonchi  Abdomen: Soft/+BS, non tender, non distended, no guarding Extremities: Right foot with purulent drainage from the second metatarsal head area. There is edema from the forefoot extending to the ankle. Positive probe to bone. Data Reviewed: Basic Metabolic Panel:  Recent Labs Lab 08/06/15 1327 08/07/15 1349 08/08/15 0540  NA 138 138 140  K 5.9* 5.5* 5.0  CL 109 110 111  CO2 21* 16* 17*  GLUCOSE 164* 145* 73  BUN 43* 36* 33*  CREATININE 2.39* 2.38* 2.21*  CALCIUM 9.2 9.1 8.7*   Liver Function Tests:  Recent Labs Lab 08/06/15 1327 08/07/15 1349  AST 11* 13*  ALT 10* 8*  ALKPHOS 59 75  BILITOT 0.6 0.7  PROT 6.7 6.3*  ALBUMIN 2.4* 2.0*   No results for input(s): LIPASE, AMYLASE in the last 168 hours. No results for input(s): AMMONIA in the last 168 hours. CBC:  Recent Labs Lab 08/06/15 1208 08/07/15 1349 08/08/15 0919  WBC 18.1* 19.7* 22.6*  NEUTROABS 14.1* 16.7*  --   HGB 9.4* 9.6* 8.2*  HCT 30.2* 31.0* 26.4*  MCV 84.8 85.6 84.6  PLT 317 328 296   Cardiac Enzymes: No results for input(s): CKTOTAL, CKMB, CKMBINDEX, TROPONINI in the last 168 hours. BNP: Invalid input(s): POCBNP CBG:  Recent Labs Lab 08/08/15 0751 08/08/15 0920 08/08/15 1207 08/08/15 1222 08/08/15 1514  GLUCAP 67 99 66 126* 112*    Recent Results (from the past 240 hour(s))  Culture, blood (single)     Status: None (Preliminary result)   Collection Time: 08/06/15 12:08 PM  Result Value Ref Range Status   Specimen Description BLOOD LEFT ARM  Final   Special Requests BOTTLES DRAWN AEROBIC AND ANAEROBIC 3CC  Final   Culture   Final    NO GROWTH 2 DAYS Performed at [facility]    Report Status PENDING  Incomplete  Wound culture     Status: None   Collection Time: 08/06/15 12:21 PM  Result Value Ref Range Status   Specimen Description WOUND RIGHT FOOT TOE  Final   Special Requests Normal  Final   Gram Stain   Final    NO WBC SEEN NO SQUAMOUS  EPITHELIAL CELLS SEEN ABUNDANT GRAM POSITIVE COCCI IN PAIRS ABUNDANT GRAM NEGATIVE COCCOBACILLI Performed at [facility]    Culture   Final    FEW DIPHTHEROIDS(CORYNEBACTERIUM SPECIES) Note: Standardized susceptibility testing for this organism is not available. Performed at [facility]    Report Status 08/08/2015 FINAL  Final  Surgical pcr screen     Status: None   Collection Time: 08/08/15 12:31 PM  Result Value Ref Range Status   MRSA, PCR NEGATIVE NEGATIVE Final   Staphylococcus aureus NEGATIVE NEGATIVE Final    Comment:        The Xpert SA Assay (FDA approved for NASAL specimens in patients over 69 years of age), is one component of a comprehensive surveillance program.  Test performance has been validated by [facility] for patients greater than or equal to 40 year old. It is not intended to diagnose infection nor to guide or monitor treatment.      Scheduled Meds: . [MAR Hold] ceFEPime (MAXIPIME) IV  2 g Intravenous Q24H  . [MAR Hold] feeding supplement (PRO-STAT SUGAR FREE  64)  30 mL Oral TID  . [MAR Hold] furosemide  40 mg Oral Daily  . [MAR Hold] heparin  5,000 Units Subcutaneous 3 times per day  . [MAR Hold] insulin aspart  0-5 Units Subcutaneous QHS  . [MAR Hold] insulin aspart  0-9 Units Subcutaneous TID WC  . [MAR Hold] insulin glargine  25 Units Subcutaneous QHS  . [MAR Hold] metronidazole  500 mg Intravenous Q8H  . [MAR Hold] mycophenolate  720 mg Oral BID  . [MAR Hold] predniSONE  5 mg Oral Q breakfast  . [MAR Hold] sodium chloride flush  3 mL Intravenous Q12H  . [MAR Hold] tacrolimus  4 mg Oral BID  . [MAR Hold] vancomycin  1,250 mg Intravenous Q24H   Continuous Infusions: . sodium chloride Stopped (08/08/15 1505)  .  sodium bicarbonate 150 mEq in sterile water 1000 mL infusion 100 mL/hr at 08/08/15 1209     Raevon Broom, DO  [facility] Pager [phone]  If 7PM-7AM, please contact night-coverage [email or web address] Password  [email or web address] 08/08/2015, 3:25 PM   LOS: 2 days

## 2015-08-08 NOTE — Progress Notes (Signed)
Pt refused lipitor, attempted to educate pt on importance, needs reinforcement. Says, "I don't want to be started on that crap," will continue to monitor. MD please follow up,

## 2015-08-08 NOTE — Anesthesia Preprocedure Evaluation (Signed)
Anesthesia Evaluation  Patient identified by MRN, date of birth, ID band Patient awake    Reviewed: Allergy & Precautions, NPO status , Patient's Chart, lab work & pertinent test results  Airway Mallampati: II  TM Distance: >3 FB Neck ROM: Full    Dental   Pulmonary sleep apnea , pneumonia, former smoker,    breath sounds clear to auscultation       Cardiovascular hypertension, + Peripheral Vascular Disease   Rhythm:Regular Rate:Normal     Neuro/Psych    GI/Hepatic GERD  ,  Endo/Other  diabetes  Renal/GU Renal disease     Musculoskeletal   Abdominal   Peds  Hematology   Anesthesia Other Findings   Reproductive/Obstetrics                             Anesthesia Physical Anesthesia Plan  ASA: III  Anesthesia Plan: General   Post-op Pain Management:    Induction: Intravenous  Airway Management Planned: LMA  Additional Equipment:   Intra-op Plan:   Post-operative Plan: Extubation in OR  Informed Consent: I have reviewed the patients History and Physical, chart, labs and discussed the procedure including the risks, benefits and alternatives for the proposed anesthesia with the patient or authorized representative who has indicated his/her understanding and acceptance.   Dental advisory given  Plan Discussed with: CRNA and Anesthesiologist  Anesthesia Plan Comments:         Anesthesia Quick Evaluation

## 2015-08-08 NOTE — Anesthesia Postprocedure Evaluation (Signed)
Anesthesia Post Note  Patient: Trevor Thompson  Procedure(s) Performed: Procedure(s) (LRB): TRANSMET AMPUTATION FOOT (Right)  Patient location during evaluation: PACU Anesthesia Type: General Level of consciousness: awake Pain management: pain level controlled Vital Signs Assessment: post-procedure vital signs reviewed and stable Respiratory status: spontaneous breathing Cardiovascular status: stable Anesthetic complications: no    Last Vitals:  Filed Vitals:   08/08/15 1615 08/08/15 1630  BP: 126/80 140/85  Pulse: 107 107  Temp:  36.8 C  Resp: 18 19    Last Pain:  Filed Vitals:   08/08/15 1637  PainSc: Asleep                 EDWARDS,Lauraann Missey

## 2015-08-08 NOTE — Transfer of Care (Signed)
Immediate Anesthesia Transfer of Care Note  Patient: Trevor Thompson  Procedure(s) Performed: Procedure(s) with comments: TRANSMET AMPUTATION FOOT (Right) - Right Transmetatarsal amputation  Patient Location: PACU  Anesthesia Type:MAC combined with regional for post-op pain  Level of Consciousness: awake, alert , oriented, patient cooperative and responds to stimulation  Airway & Oxygen Therapy: Patient Spontanous Breathing  Post-op Assessment: Report given to RN, Post -op Vital signs reviewed and stable, Patient moving all extremities and Patient moving all extremities X 4  Post vital signs: Reviewed and stable  Last Vitals:  Filed Vitals:   08/08/15 1330 08/08/15 1335  BP: 110/46 148/71  Pulse: 96 98  Temp:    Resp: 10 21    Complications: No apparent anesthesia complications

## 2015-08-08 NOTE — Progress Notes (Signed)
Patient ID: Trevor Thompson, male   DOB: April 02, 1961, 55 y.o.   MRN: [id number]  [facility] Progress Note   Assessment/ Plan:   1. 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 2. Chronic kidney disease stage III T (status post kidney transplant 13 years ago in [place] and recent acute on chronic renal failure with higher baseline). Switch IVF to isotonic sodium bicarbonate-continue antirejection therapy with Prograf and CellCept as well as prednisone. 3. Hyperkalemia: likely from metabolic acidosis and release from necrotic tissue--better with diuretic/kayexalate/UOP. 4. Anemia: Anemia of chronic kidney disease/chronic infection/inflammation. Iron studies pending. 5. Hypertension: Blood pressures appear to be satisfactorily controlled at this point, continue to monitor closely.  Subjective:   No complaints--awaiting TMT amputation   Objective:   BP 138/71 mmHg  Pulse 98  Temp(Src) 98.6 F (37 C) (Oral)  Resp 16  Ht  (1.803 m)  Wt 102.5 kg (225 lb 15.5 oz)  BMI 31.53 kg/m2  SpO2 98%  Intake/Output Summary (Last 24 hours) at 08/08/15 1055 Last data filed at 08/08/15 0900  Gross per 24 hour  Intake 1049.17 ml  Output      0 ml  Net 1049.17 ml   Weight change:   Physical Exam: WUJ:WJXBJYNWGNF resting in bed AOZ:HYQMV regular tachy, ESM over apex Resp:CTA bilaterally, no rales/rhonchi HQI:ONGE, obese, NT, BS normal Ext:s/p left BKA Right foot malodorous with dusky lateral toes and draining wound at site of former 2nd toe  Imaging: Portable Chest 1 View  08/06/2015  CLINICAL DATA:  55 year old male under preoperative evaluation prior to right foot amputation. EXAM: PORTABLE CHEST 1 VIEW COMPARISON:  Chest x-ray 08/22/2014. FINDINGS: Linear scarring in the right mid lung is unchanged. No acute consolidative airspace disease. No pleural effusions. Chronic  elevation of the right hemidiaphragm is unchanged. No evidence of pulmonary edema. Heart size is borderline enlarged. Upper mediastinal contours are within normal limits. Atherosclerosis in the thoracic aorta. IMPRESSION: 1. No radiographic evidence of acute cardiopulmonary disease. 2. Atherosclerosis. Electronically Signed   By: Trudie Reed M.D.   On: 08/06/2015 17:01    Labs: BMET  Recent Labs Lab 08/06/15 1327 08/07/15 1349 08/08/15 0540  NA 138 138 140  K 5.9* 5.5* 5.0  CL 109 110 111  CO2 21* 16* 17*  GLUCOSE 164* 145* 73  BUN 43* 36* 33*  CREATININE 2.39* 2.38* 2.21*  CALCIUM 9.2 9.1 8.7*   CBC  Recent Labs Lab 08/06/15 1208 08/07/15 1349 08/08/15 0919  WBC 18.1* 19.7* 22.6*  NEUTROABS 14.1* 16.7*  --   HGB 9.4* 9.6* 8.2*  HCT 30.2* 31.0* 26.4*  MCV 84.8 85.6 84.6  PLT 317 328 296    Medications:    . ceFEPime (MAXIPIME) IV  2 g Intravenous Q24H  . clindamycin (CLEOCIN) IV  900 mg Intravenous To SS-Surg  . feeding supplement (PRO-STAT SUGAR FREE 64)  30 mL Oral TID  . furosemide  40 mg Oral Daily  . heparin  5,000 Units Subcutaneous 3 times per day  . insulin aspart  0-5 Units Subcutaneous QHS  . insulin aspart  0-9 Units Subcutaneous TID WC  . insulin glargine  25 Units Subcutaneous QHS  . metronidazole  500 mg Intravenous Q8H  . mycophenolate  720 mg Oral BID  . predniSONE  5 mg Oral Q breakfast  . sodium chloride flush  3 mL Intravenous Q12H  . tacrolimus  4 mg Oral BID  . vancomycin  1,250 mg Intravenous Q24H   Zetta Bills, MD 08/08/2015, 10:55 AM

## 2015-08-08 NOTE — Brief Op Note (Signed)
08/06/2015 - 08/08/2015  2:58 PM  PATIENT:  Trevor Thompson  55 y.o. male  PRE-OPERATIVE DIAGNOSIS:  Diabetic wet gangrene of right foot  POST-OPERATIVE DIAGNOSIS:  Diabetic wet gangrene of right foot  PROCEDURE:  Procedure(s) with comments: TRANSMET AMPUTATION FOOT (Right) - Right Transmetatarsal amputation  SURGEON:  Surgeon(s) and Role:    * Jodi Geralds, MD - Primary  PHYSICIAN ASSISTANT:   ASSISTANTS: bethune   ANESTHESIA:   IV sedation  EBL:  Total I/O In: 0  Out: 50 [Blood:50]  BLOOD ADMINISTERED:none  DRAINS: none   LOCAL MEDICATIONS USED:  NONE  SPECIMEN:  Source of Specimen:  r distal foot  DISPOSITION OF SPECIMEN:  PATHOLOGY  COUNTS:  YES  TOURNIQUET:   Total Tourniquet Time Documented: Calf (Right) - 18 minutes Total: Calf (Right) - 18 minutes   DICTATION: .Other Dictation: Dictation Number [id number]  PLAN OF CARE: Admit to inpatient   PATIENT DISPOSITION:  PACU - hemodynamically stable.   Delay start of Pharmacological VTE agent (>24hrs) due to surgical blood loss or risk of bleeding: no

## 2015-08-08 NOTE — Progress Notes (Signed)
Hypoglycemic Event  CBG: 66  Treatment: D50 IV 25 mL  Symptoms: none  Follow-up CBG: Time:1222 CBG Result:126  Possible Reasons for Event: Inadequate meal intake  Comments/MD notified:none    Trevor Thompson A

## 2015-08-08 NOTE — Care Management Important Message (Signed)
Important Message  Patient Details  Name: Trevor Thompson MRN: [id number] Date of Birth: [birth year]   Medicare Important Message Given:  Yes    Iam Lipson, Annamarie Major, RN 08/08/2015, 2:06 PM

## 2015-08-08 NOTE — Progress Notes (Signed)
  Hypoglycemic Event  CBG: 67  Treatment: D50 IV 25 mL  Symptoms: None  CBG Result:99  Possible Reasons for Event: Inadequate meal intake  Comments/MD notified: none    Trevor Thompson A

## 2015-08-08 NOTE — Anesthesia Procedure Notes (Addendum)
Anesthesia Regional Block:  Popliteal block  Pre-Anesthetic Checklist: ,, timeout performed, Correct Patient, Correct Site, Correct Laterality, Correct Procedure, Correct Position, site marked, Risks and benefits discussed,  Surgical consent,  Pre-op evaluation,  At surgeon's request and post-op pain management  Laterality: Lower and Right  Prep: chloraprep       Needles:  Injection technique: Single-shot  Needle Type: Echogenic Stimulator Needle          Additional Needles:  Procedures: ultrasound guided (picture in chart) and nerve stimulator Popliteal block  Nerve Stimulator or Paresthesia:  Response: plantar, 0.5 mA,   Additional Responses:   Narrative:  Injection made incrementally with aspirations every 5 mL.  Performed by: Personally  Anesthesiologist: MOSER, CHRISTOPHER  Additional Notes: H+P and labs reviewed, risks and benefits discussed with patient, procedure tolerated well without complications   Procedure Name: MAC Date/Time: 08/08/2015 1:59 PM Performed by: Sharlene Dory E Pre-anesthesia Checklist: Patient identified, Emergency Drugs available, Suction available, Patient being monitored and Timeout performed Patient Re-evaluated:Patient Re-evaluated prior to inductionOxygen Delivery Method: Simple face mask Intubation Type: IV induction Placement Confirmation: positive ETCO2

## 2015-08-09 DIAGNOSIS — N183 Chronic kidney disease, stage 3 unspecified: Secondary | ICD-10-CM

## 2015-08-09 LAB — GLUCOSE, CAPILLARY
GLUCOSE-CAPILLARY: 105 mg/dL — AB (ref 65–99)
Glucose-Capillary: 85 mg/dL (ref 65–99)
Glucose-Capillary: 119 mg/dL — ABNORMAL HIGH (ref 65–99)
Glucose-Capillary: 177 mg/dL — ABNORMAL HIGH (ref 65–99)

## 2015-08-09 LAB — RENAL FUNCTION PANEL
ALBUMIN: 1.7 g/dL — AB (ref 3.5–5.0)
ANION GAP: 11 (ref 5–15)
BUN: 27 mg/dL — AB (ref 6–20)
CALCIUM: 8.6 mg/dL — AB (ref 8.9–10.3)
CO2: 18 mmol/L — AB (ref 22–32)
CREATININE: 2.06 mg/dL — AB (ref 0.61–1.24)
Chloride: 108 mmol/L (ref 101–111)
GFR calc Af Amer: 40 mL/min — ABNORMAL LOW (ref >60)
GFR calc non Af Amer: 35 mL/min — ABNORMAL LOW (ref >60)
Glucose, Bld: 88 mg/dL (ref 65–99)
Phosphorus: 3.8 mg/dL (ref 2.5–4.6)
Potassium: 4.2 mmol/L (ref 3.5–5.1)
SODIUM: 137 mmol/L (ref 135–145)

## 2015-08-09 LAB — CBC
HCT: 27.6 % — ABNORMAL LOW (ref 39.0–52.0)
Hemoglobin: 8.4 g/dL — ABNORMAL LOW (ref 13.0–17.0)
MCH: 25.7 pg — AB (ref 26.0–34.0)
MCHC: 30.4 g/dL (ref 30.0–36.0)
MCV: 84.4 fL (ref 78.0–100.0)
PLATELETS: 313 10*3/uL (ref 150–400)
RBC: 3.27 MIL/uL — AB (ref 4.22–5.81)
RDW: 14.5 % (ref 11.5–15.5)
WBC: 21.4 10*3/uL — ABNORMAL HIGH (ref 4.0–10.5)

## 2015-08-09 LAB — SEDIMENTATION RATE: SED RATE: 123 mm/h — AB (ref 0–16)

## 2015-08-09 LAB — C-REACTIVE PROTEIN: CRP: 17.2 mg/dL — ABNORMAL HIGH (ref <1.0)

## 2015-08-09 MED ORDER — SODIUM BICARBONATE 650 MG PO TABS
650.0000 mg | ORAL_TABLET | Freq: Two times a day (BID) | ORAL | Status: DC
  Administered 2015-08-09 – 2015-08-14 (×11): 650 mg via ORAL
  Filled 2015-08-09 (×11): qty 1

## 2015-08-09 MED ORDER — DARBEPOETIN ALFA 100 MCG/0.5ML IJ SOSY
100.0000 ug | PREFILLED_SYRINGE | INTRAMUSCULAR | Status: DC
  Administered 2015-08-09: 100 ug via SUBCUTANEOUS
  Filled 2015-08-09: qty 0.5

## 2015-08-09 NOTE — Progress Notes (Addendum)
Patient ID: Trevor Thompson, male   DOB: 1960/10/24, 55 y.o.   MRN: [id number]  [facility] Progress Note   Assessment/ Plan:   1. 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 Reports tolerable pain at this time and overall feels better. 2. Chronic kidney disease stage III T (status post kidney transplant 13 years ago in [place] and recent acute on chronic renal failure with higher baseline). Renal function continues to show gradual improvement-likely the reflection of intravenous fluid/dilution-will allow for him to eat and drink today and plan discontinuation of fluids later today. Will start on oral sodium bicarbonate supplement. 3. Hyperkalemia: likely from metabolic acidosis and release from necrotic tissue--resolved status post diuretic therapy/Kayexalate/IV fluids and now status post amputation. 4. Anemia: Anemia of chronic kidney disease/chronic infection/inflammation. Iron studies show low iron saturation but prohibitively high ferritin-we'll give Aranesp. No acute indications for blood transfusion  5. Hypertension: Blood pressures appear to be satisfactorily controlled at this point, continue to monitor closely.  Subjective:   No current complaints-nurse's notes reviewed, refused Lipitor earlier. We discussed compliance.    Objective:   BP 146/74 mmHg  Pulse 101  Temp(Src) 98.8 F (37.1 C) (Oral)  Resp 19  Ht  (1.803 m)  Wt 105 kg (231 lb 7.7 oz)  BMI 32.30 kg/m2  SpO2 96%  Intake/Output Summary (Last 24 hours) at 08/09/15 4098 Last data filed at 08/09/15 0729  Gross per 24 hour  Intake    620 ml  Output   1350 ml  Net   -730 ml   Weight change:   Physical Exam: JXB:[id number] resting in bed YQM:VHQIO regular tachy, ESM over apex Resp:CTA bilaterally, no rales/rhonchi NGE:XBMW, obese, NT, BS  normal Ext:s/p left BKA Status post right transmetatarsal amputation with forefoot stump/foot in dressing.  Imaging: Dg Foot 2 Views Right  08/08/2015  CLINICAL DATA:  Status post right foot amputation EXAM: RIGHT FOOT - 2 VIEW COMPARISON:  08/01/2015 FINDINGS: The patient is status post right forefoot amputation at the level of the base of the metatarsal bones. There are no complications identified. Overlying bandage material is noted. Vascular calcifications are present. IMPRESSION: 1. Status post forefoot amputation without complication. Electronically Signed   By: Signa Kell M.D.   On: 08/08/2015 16:19    Labs: BMET  Recent Labs Lab 08/06/15 1327 08/07/15 1349 08/08/15 0540 08/09/15 0545  NA 138 138 140 137  K 5.9* 5.5* 5.0 4.2  CL 109 110 111 108  CO2 21* 16* 17* 18*  GLUCOSE 164* 145* 73 88  BUN 43* 36* 33* 27*  CREATININE 2.39* 2.38* 2.21* 2.06*  CALCIUM 9.2 9.1 8.7* 8.6*  PHOS  --   --   --  3.8   CBC  Recent Labs Lab 08/06/15 1208 08/07/15 1349 08/08/15 0919 08/09/15 0545  WBC 18.1* 19.7* 22.6* 21.4*  NEUTROABS 14.1* 16.7*  --   --   HGB 9.4* 9.6* 8.2* 8.4*  HCT 30.2* 31.0* 26.4* 27.6*  MCV 84.8 85.6 84.6 84.4  PLT 317 328 296 313    Medications:    . atorvastatin  20 mg Oral q1800  . calcitRIOL  0.5 mcg Oral Daily  . ceFEPime (MAXIPIME) IV  2 g Intravenous Q24H  . feeding supplement (PRO-STAT SUGAR FREE 64)  30 mL Oral TID  . furosemide  40 mg Oral Daily  . heparin  5,000 Units Subcutaneous 3  times per day  . insulin aspart  0-5 Units Subcutaneous QHS  . insulin aspart  0-9 Units Subcutaneous TID WC  . insulin glargine  20 Units Subcutaneous Daily  . metronidazole  500 mg Intravenous Q8H  . mycophenolate  720 mg Oral BID  . predniSONE  5 mg Oral Q breakfast  . tacrolimus  4 mg Oral BID  . vancomycin  1,250 mg Intravenous Q24H   Zetta Bills, MD 08/09/2015, 8:32 AM

## 2015-08-09 NOTE — Progress Notes (Signed)
   PATIENT ID: Trevor Thompson   1 Day Post-Op Procedure(s) (LRB): TRANSMET AMPUTATION FOOT (Right)  Subjective: Sitting up in bed, reports he is comfortable. Minimal pain in foot. No complaints or concerns.   Objective:  Filed Vitals:   08/09/15 0445 08/09/15 0728  BP: 135/74 146/74  Pulse: 104 101  Temp: 99.2 F (37.3 C) 98.8 F (37.1 C)  Resp: 21 19    Right foot dressing c/d/i Surrounding skin to the dressing is healthy, knee with full ROM  Labs:   Recent Labs  08/06/15 1208 08/07/15 1349 08/08/15 0919 08/09/15 0545  HGB 9.4* 9.6* 8.2* 8.4*   Recent Labs  08/08/15 0919 08/09/15 0545  WBC 22.6* 21.4*  RBC 3.12* 3.27*  HCT 26.4* 27.6*  PLT 296 313   Recent Labs  08/08/15 0540 08/09/15 0545  NA 140 137  K 5.0 4.2  CL 111 108  CO2 17* 18*  BUN 33* 27*  CREATININE 2.21* 2.06*  GLUCOSE 73 88  CALCIUM 8.7* 8.6*    Assessment and Plan: 1 day s/p transmet amputation right foot, per Dr. Luiz Blare plan to take back to OR Monday for debriedment and more complete wound closure, will make NPO midnight sunday Dressing to remain on until Monday Okay to be OOB, transfer to chair Continue abx for primary team Appreciate primary team recommendations and care for this complex patient  VTE proph: primary team

## 2015-08-09 NOTE — Op Note (Signed)
Trevor Thompson, SUTCLIFFE NO.:  [id number]  MEDICAL RECORD NO.:  [id number]  LOCATION:  6E12C                        FACILITY:  [facility]  PHYSICIAN:  Alta Corning, M.D.   DATE OF BIRTH:  Nov 14, 1960  DATE OF PROCEDURE:  08/08/2015 DATE OF DISCHARGE:                              OPERATIVE REPORT   PREOPERATIVE DIAGNOSIS:  Severe infection of the metatarsophalangeal joint area of all remaining toes on the right foot.  POSTOPERATIVE DIAGNOSIS:  Severe infection of the metatarsophalangeal joint area of all remaining toes on the right foot.  PROCEDURE:  Transmetatarsal amputation with open packing.  SURGEON:  Alta Corning, MD  ASSISTANT:  Modena Slater.  ANESTHESIA:  General.  BRIEF HISTORY:  Trevor Thompson is a 55 year old male with a history of having had a left BKA.  He had a second toe amputation at outside facility and was just not healing at all.  We were consulted for evaluation and trying to salvage the foot in consultation with the wound Center.  We got an MRI just to see what we were dealing with.  In the period of time between when we saw him and we had his MRI done, he unfortunately presented back to the emergency room with basically pus throughout the metatarsophalangeal joint region of the lateral aspect of the foot.  He was admitted to Medicine.  We were consulted. We reviewed the old MRI, evaluated him, felt that he needed a transmetatarsal amputation as this was really only option at that point, and he was brought to the operating room for this procedure.  He was given 2 days of IV antibiotic therapy prior to coming to the operating room.  PROCEDURE IN DETAIL:  The patient brought to the operating room.  After adequate anesthesia was obtained with general anesthetic with a block and MAC, the patient was placed supine on operating table.  The right leg was prepped and draped in usual sterile fashion.  Following this, the leg was elevated for 3 minutes and  the blood pressure tourniquet was inflated to 250 mmHg.  Following this, lines were drawn for a transmetatarsal amputation and the metatarsals were resected individually and once this was completed, the tissue was removed all the way back to the posterior skin incision.  The posterior skin just did look that great, there were all kinds of necrotic appearing tissue.  We were concerned about the ability for this to heal, but felt that we needed this skin to get a good trans met.  We felt that we had some skin in each direction but were not sure exactly how it was all but goes, so at this point, we went ahead and debrided the wounds fully and thoroughly and we took each of the tendons and pulled them distally into the wound, resected them, try to milk the foot.  There was really no pus that I could identify that was tracking up the tendon sheaths, but obviously there was concern about this.  At this point, I felt that closing him would be not satisfactory and so at this point, we decided to do was just pack it open and closed the wound with a small  amount of Betadine packing in here and then closed about 3 stitches in the skin just that the skin would not retract and then put a compressive dressing on.  We will take him back to the operating room on Monday for debridement and possible revision of his amputation.     Alta Corning, M.D.     Corliss Skains  D:  08/08/2015  T:  08/08/2015  Job:  [id number]  cc:   Alta Corning, M.D.

## 2015-08-09 NOTE — Progress Notes (Signed)
PROGRESS NOTE  Trevor Thompson FYB:[id number] DOB: Dec 09, 1960 DOA: 08/06/2015 PCP: Minerva Ends, MD  Brief History 55 y/o with a history of renal transplantation, peripheral vascular disease, hypertension, diabetes mellitus type 2, and left BKA presented with worsening diabetic foot infection. The patient recently had amputation of his right second toe in [place] in December 2016. He stated that the wound never healed, and he had been going to the wound care center on a daily basis. In the past 2 weeks, there has been increasing edema and drainage about his right foot. The patient states that he has not been placed on any antibiotics recently. The patient saw his orthopedist, Dr. Berenice Primas, in office approximately one week prior to admission and MRI was done at an outside facility. Assessment/Plan: Sepsis -present at the time of admission -pt had tachycardia HR>110 and WBC 18.1 -secondary to foot infection -pt had fluids at 150cc/hr -blood cultures negative Acute osteomyelitis right foot -positive probe to bone test with purulent drainage noted -appreciate ortho follow up -R-TMA on 08/08/15 -08/01/2015 MRI R- foot--abnormal for collection adjacent to the second, third, and fourth distal metatarsals; also with abnormal edema in the extensor tendon sheaths of the fourth and fifth metatarsals; it was felt that the fluid collections likely represented pus; no frank radiographic features of osteomyelitis -ESR--123 -CRP--17.2 -Continue vancomycin, cefepime, metronidazole -nonsurgical wound culture--not reliable CKD stage III with renal transplantation  -Appreciate Nephrology -status post kidney transplant 13 years ago in [place]  -Continue tacrolimus and mycophenolate  -Continue maintenance prednisone  -Previous Baseline creatinine 2.2-2.3  -Continue furosemide -bicarbonate drip-->oral bicarb Diabetes mellitus type 2  -Hemoglobin A1c--pending -Continue Lantus-->reduce  dose to 20 units -CBGs well controlled  -NovoLog sliding scale  Hyperlipidemia  -Continue statin  Hyperkalemia -improved with fluids/kayexalate Severe protein calorie malnutrition -Nutritional supplementation Diarrhea -check cdiff  Family Communication: family update at beside 08/09/15 Disposition Plan: Home 2-3 days  Procedures/Studies: Portable Chest 1 View  08/06/2015  CLINICAL DATA:  55 year old male under preoperative evaluation prior to right foot amputation. EXAM: PORTABLE CHEST 1 VIEW COMPARISON:  Chest x-ray 08/22/2014. FINDINGS: Linear scarring in the right mid lung is unchanged. No acute consolidative airspace disease. No pleural effusions. Chronic elevation of the right hemidiaphragm is unchanged. No evidence of pulmonary edema. Heart size is borderline enlarged. Upper mediastinal contours are within normal limits. Atherosclerosis in the thoracic aorta. IMPRESSION: 1. No radiographic evidence of acute cardiopulmonary disease. 2. Atherosclerosis. Electronically Signed   By: Vinnie Langton M.D.   On: 08/06/2015 17:01   Dg Foot 2 Views Right  08/08/2015  CLINICAL DATA:  Status post right foot amputation EXAM: RIGHT FOOT - 2 VIEW COMPARISON:  08/01/2015 FINDINGS: The patient is status post right forefoot amputation at the level of the base of the metatarsal bones. There are no complications identified. Overlying bandage material is noted. Vascular calcifications are present. IMPRESSION: 1. Status post forefoot amputation without complication. Electronically Signed   By: Kerby Moors M.D.   On: 08/08/2015 16:19         Subjective: Patient denies fevers, chills, headache, chest pain, dyspnea, nausea, vomiting, diarrhea, abdominal pain, dysuria, hematuria   Objective: Filed Vitals:   08/08/15 2152 08/09/15 0445 08/09/15 0728 08/09/15 1524  BP: 135/62 135/74 146/74 117/59  Pulse: 103 104 101 106  Temp: 100.1 F (37.8 C) 99.2 F (37.3 C) 98.8 F (37.1 C) 99.2 F  (37.3 C)  TempSrc:   Oral Oral  Resp: 22 21 19 18   Height:      Weight: 105 kg (231 lb 7.7 oz)     SpO2: 98% 97% 96% 93%    Intake/Output Summary (Last 24 hours) at 08/09/15 1852 Last data filed at 08/09/15 1841  Gross per 24 hour  Intake    720 ml  Output   2300 ml  Net  -1580 ml   Weight change:  Exam:   General:  Pt is alert, follows commands appropriately, not in acute distress  HEENT: No icterus, No thrush, No neck mass, [place]/AT  Cardiovascular: RRR, S1/S2, no rubs, no gallops  Respiratory: CTA bilaterally, no wheezing, no crackles, no rhonchi  Abdomen: Soft/+BS, non tender, non distended, no guarding  Extremities: No edema, No lymphangitis, No petechiae, No rashes, no synovitis--R foot wrapped  Data Reviewed: Basic Metabolic Panel:  Recent Labs Lab 08/06/15 1327 08/07/15 1349 08/08/15 0540 08/09/15 0545  NA 138 138 140 137  K 5.9* 5.5* 5.0 4.2  CL 109 110 111 108  CO2 21* 16* 17* 18*  GLUCOSE 164* 145* 73 88  BUN 43* 36* 33* 27*  CREATININE 2.39* 2.38* 2.21* 2.06*  CALCIUM 9.2 9.1 8.7* 8.6*  PHOS  --   --   --  3.8   Liver Function Tests:  Recent Labs Lab 08/06/15 1327 08/07/15 1349 08/09/15 0545  AST 11* 13*  --   ALT 10* 8*  --   ALKPHOS 59 75  --   BILITOT 0.6 0.7  --   PROT 6.7 6.3*  --   ALBUMIN 2.4* 2.0* 1.7*   No results for input(s): LIPASE, AMYLASE in the last 168 hours. No results for input(s): AMMONIA in the last 168 hours. CBC:  Recent Labs Lab 08/06/15 1208 08/07/15 1349 08/08/15 0919 08/09/15 0545  WBC 18.1* 19.7* 22.6* 21.4*  NEUTROABS 14.1* 16.7*  --   --   HGB 9.4* 9.6* 8.2* 8.4*  HCT 30.2* 31.0* 26.4* 27.6*  MCV 84.8 85.6 84.6 84.4  PLT 317 328 296 313   Cardiac Enzymes: No results for input(s): CKTOTAL, CKMB, CKMBINDEX, TROPONINI in the last 168 hours. BNP: Invalid input(s): POCBNP CBG:  Recent Labs Lab 08/08/15 1718 08/08/15 2149 08/09/15 0727 08/09/15 1136 08/09/15 1714  GLUCAP 111* 116* 85 105*  119*    Recent Results (from the past 240 hour(s))  Culture, blood (single)     Status: None (Preliminary result)   Collection Time: 08/06/15 12:08 PM  Result Value Ref Range Status   Specimen Description BLOOD LEFT ARM  Final   Special Requests BOTTLES DRAWN AEROBIC AND ANAEROBIC 3CC  Final   Culture   Final    NO GROWTH 3 DAYS Performed at [facility]    Report Status PENDING  Incomplete  Wound culture     Status: None   Collection Time: 08/06/15 12:21 PM  Result Value Ref Range Status   Specimen Description WOUND RIGHT FOOT TOE  Final   Special Requests Normal  Final   Gram Stain   Final    NO WBC SEEN NO SQUAMOUS EPITHELIAL CELLS SEEN ABUNDANT GRAM POSITIVE COCCI IN PAIRS ABUNDANT GRAM NEGATIVE COCCOBACILLI Performed at [facility]    Culture   Final    FEW DIPHTHEROIDS(CORYNEBACTERIUM SPECIES) Note: Standardized susceptibility testing for this organism is not available. Performed at [facility]    Report Status 08/08/2015 FINAL  Final  Surgical pcr screen     Status: None   Collection Time: 08/08/15 12:31 PM  Result Value Ref Range Status   MRSA, PCR NEGATIVE NEGATIVE Final   Staphylococcus aureus NEGATIVE NEGATIVE Final    Comment:        The Xpert SA Assay (FDA approved for NASAL specimens in patients over 39 years of age), is one component of a comprehensive surveillance program.  Test performance has been validated by [facility] for patients greater than or equal to 37 year old. It is not intended to diagnose infection nor to guide or monitor treatment.      Scheduled Meds: . atorvastatin  20 mg Oral q1800  . calcitRIOL  0.5 mcg Oral Daily  . ceFEPime (MAXIPIME) IV  2 g Intravenous Q24H  . darbepoetin (ARANESP) injection - NON-DIALYSIS  100 mcg Subcutaneous Q Sat-1800  . feeding supplement (PRO-STAT SUGAR FREE 64)  30 mL Oral TID  . furosemide  40 mg Oral Daily  . heparin  5,000 Units Subcutaneous 3 times per day  .  insulin aspart  0-5 Units Subcutaneous QHS  . insulin aspart  0-9 Units Subcutaneous TID WC  . insulin glargine  20 Units Subcutaneous Daily  . metronidazole  500 mg Intravenous Q8H  . mycophenolate  720 mg Oral BID  . predniSONE  5 mg Oral Q breakfast  . sodium bicarbonate  650 mg Oral BID  . tacrolimus  4 mg Oral BID  . vancomycin  1,250 mg Intravenous Q24H   Continuous Infusions: . sodium chloride Stopped (08/08/15 1505)     Delmont Prosch, DO  [facility] Pager [phone]  If 7PM-7AM, please contact night-coverage [email or web address] Password [email or web address] 08/09/2015, 6:52 PM   LOS: 3 days

## 2015-08-10 LAB — CBC
HCT: 26.5 % — ABNORMAL LOW (ref 39.0–52.0)
Hemoglobin: 8.3 g/dL — ABNORMAL LOW (ref 13.0–17.0)
MCH: 26.3 pg (ref 26.0–34.0)
MCHC: 31.3 g/dL (ref 30.0–36.0)
MCV: 83.9 fL (ref 78.0–100.0)
Platelets: 292 10*3/uL (ref 150–400)
RBC: 3.16 MIL/uL — ABNORMAL LOW (ref 4.22–5.81)
RDW: 14.4 % (ref 11.5–15.5)
WBC: 17.1 10*3/uL — ABNORMAL HIGH (ref 4.0–10.5)

## 2015-08-10 LAB — RENAL FUNCTION PANEL
ALBUMIN: 1.6 g/dL — AB (ref 3.5–5.0)
Anion gap: 11 (ref 5–15)
BUN: 27 mg/dL — AB (ref 6–20)
CALCIUM: 8.6 mg/dL — AB (ref 8.9–10.3)
CO2: 21 mmol/L — AB (ref 22–32)
Chloride: 108 mmol/L (ref 101–111)
Creatinine, Ser: 1.94 mg/dL — ABNORMAL HIGH (ref 0.61–1.24)
GFR calc Af Amer: 43 mL/min — ABNORMAL LOW (ref >60)
GFR calc non Af Amer: 37 mL/min — ABNORMAL LOW (ref >60)
GLUCOSE: 124 mg/dL — AB (ref 65–99)
PHOSPHORUS: 3.1 mg/dL (ref 2.5–4.6)
POTASSIUM: 3.6 mmol/L (ref 3.5–5.1)
SODIUM: 140 mmol/L (ref 135–145)

## 2015-08-10 LAB — GLUCOSE, CAPILLARY
GLUCOSE-CAPILLARY: 124 mg/dL — AB (ref 65–99)
Glucose-Capillary: 108 mg/dL — ABNORMAL HIGH (ref 65–99)
Glucose-Capillary: 153 mg/dL — ABNORMAL HIGH (ref 65–99)
Glucose-Capillary: 149 mg/dL — ABNORMAL HIGH (ref 65–99)

## 2015-08-10 MED ORDER — CEFAZOLIN SODIUM-DEXTROSE 2-3 GM-% IV SOLR
2.0000 g | INTRAVENOUS | Status: DC
  Filled 2015-08-10: qty 50

## 2015-08-10 MED ORDER — CHLORHEXIDINE GLUCONATE 4 % EX LIQD
60.0000 mL | Freq: Once | CUTANEOUS | Status: AC
  Administered 2015-08-11: 4 via TOPICAL
  Filled 2015-08-10: qty 60

## 2015-08-10 NOTE — Progress Notes (Signed)
   PATIENT ID: Trevor Thompson   2 Days Post-Op Procedure(s) (LRB): TRANSMET AMPUTATION FOOT (Right)  Subjective:Sitting up in bed, reports he is comfortable. Minimal pain in foot. No complaints or concerns.   Objective:  Filed Vitals:   08/10/15 0700 08/10/15 0826  BP: 158/82 137/72  Pulse: 86 101  Temp: 98.6 F (37 C) 98.4 F (36.9 C)  Resp: 20 18     Right foot dressing c/d/i Surrounding skin to the dressing is healthy, knee with full ROM  Labs:   Recent Labs  08/07/15 1349 08/08/15 0919 08/09/15 0545 08/10/15 0546  HGB 9.6* 8.2* 8.4* 8.3*   Recent Labs  08/09/15 0545 08/10/15 0546  WBC 21.4* 17.1*  RBC 3.27* 3.16*  HCT 27.6* 26.5*  PLT 313 292   Recent Labs  08/09/15 0545 08/10/15 0546  NA 137 140  K 4.2 3.6  CL 108 108  CO2 18* 21*  BUN 27* 27*  CREATININE 2.06* 1.94*  GLUCOSE 88 124*  CALCIUM 8.6* 8.6*    Assessment and Plan: 2 day s/p transmet amputation right foot, per Dr. Luiz Blare plan to take back to OR Monday for debriedment and more complete wound closure,  NPO at midnight and consent order in Dressing to remain on until Monday Okay to be OOB, transfer to chair Continue abx for primary team Appreciate primary team recommendations and care for this complex patient  VTE proph: primary team

## 2015-08-10 NOTE — Progress Notes (Signed)
PROGRESS NOTE  Mackey Varricchio LNZ:[id number] DOB: [birth year] DOA: 08/06/2015 PCP: Minerva Ends, MD  Brief History 55 y/o with a history of renal transplantation, peripheral vascular disease, hypertension, diabetes mellitus type 2, and left BKA presented with worsening diabetic foot infection. The patient recently had amputation of his right second toe in [place] in December 2016. He stated that the wound never healed, and he had been going to the wound care center on a daily basis. In the past 2 weeks, there has been increasing edema and drainage about his right foot. The patient states that he has not been placed on any antibiotics recently. The patient saw his orthopedist, Dr. Berenice Primas, in office approximately one week prior to admission and MRI was done at an outside facility. Assessment/Plan: Sepsis -present at the time of admission -pt had tachycardia HR>110 and WBC 18.1 -secondary to foot infection -pt had fluids at 150cc/hr -blood cultures negative Acute osteomyelitis right foot -positive probe to bone test with purulent drainage noted -appreciate ortho follow up -R-TMA on 08/08/15 -08/11/15--revision of TMA planned -08/01/2015 MRI R- foot--abnormal for collection adjacent to the second, third, and fourth distal metatarsals; also with abnormal edema in the extensor tendon sheaths of the fourth and fifth metatarsals; it was felt that the fluid collections likely represented pus; no frank radiographic features of osteomyelitis -ESR--123 -CRP--17.2 -Continue vancomycin, cefepime, metronidazole -nonsurgical wound culture--not reliable CKD stage III with renal transplantation  -Appreciate Nephrology -status post kidney transplant 13 years ago in [place]  -Continue tacrolimus and mycophenolate  -Continue maintenance prednisone  -Previous Baseline creatinine 2.1-2.3  -Continue furosemide -bicarbonate drip-->oral bicarb Diabetes mellitus type 2  -Hemoglobin  A1c--pending -Continue Lantus-->reduce dose to 20 units -CBGs well controlled  -NovoLog sliding scale  Hyperlipidemia  -Continue statin  Hyperkalemia -improved with fluids/kayexalate Severe protein calorie malnutrition -Nutritional supplementation Diarrhea -check cdiff  Family Communication: family update at beside 08/09/15 Disposition Plan: Home 2/6 or 2/7 if cleared by ortho     Procedures/Studies: Portable Chest 1 View  08/06/2015  CLINICAL DATA:  55 year old male under preoperative evaluation prior to right foot amputation. EXAM: PORTABLE CHEST 1 VIEW COMPARISON:  Chest x-ray 08/22/2014. FINDINGS: Linear scarring in the right mid lung is unchanged. No acute consolidative airspace disease. No pleural effusions. Chronic elevation of the right hemidiaphragm is unchanged. No evidence of pulmonary edema. Heart size is borderline enlarged. Upper mediastinal contours are within normal limits. Atherosclerosis in the thoracic aorta. IMPRESSION: 1. No radiographic evidence of acute cardiopulmonary disease. 2. Atherosclerosis. Electronically Signed   By: Vinnie Langton M.D.   On: 08/06/2015 17:01   Dg Foot 2 Views Right  08/08/2015  CLINICAL DATA:  Status post right foot amputation EXAM: RIGHT FOOT - 2 VIEW COMPARISON:  08/01/2015 FINDINGS: The patient is status post right forefoot amputation at the level of the base of the metatarsal bones. There are no complications identified. Overlying bandage material is noted. Vascular calcifications are present. IMPRESSION: 1. Status post forefoot amputation without complication. Electronically Signed   By: Kerby Moors M.D.   On: 08/08/2015 16:19         Subjective: Patient denies fevers, chills, headache, chest pain, dyspnea, nausea, vomiting, diarrhea, abdominal pain, dysuria, hematuria   Objective: Filed Vitals:   08/09/15 1524 08/09/15 2100 08/10/15 0700 08/10/15 0826  BP: 117/59 123/63 158/82 137/72  Pulse: 106 104 86 101  Temp:  99.2 F (37.3 C) 97.9 F (36.6 C) 98.6 F (37  C) 98.4 F (36.9 C)  TempSrc: Oral Oral Oral Oral  Resp: 18 20 20 18   Height:      Weight:   103.5 kg (228 lb 2.8 oz)   SpO2: 93% 94% 95% 100%    Intake/Output Summary (Last 24 hours) at 08/10/15 1714 Last data filed at 08/10/15 1423  Gross per 24 hour  Intake    920 ml  Output   1602 ml  Net   -682 ml   Weight change: -1.5 kg (-3 lb 4.9 oz) Exam:   General:  Pt is alert, follows commands appropriately, not in acute distress  HEENT: No icterus, No thrush, No neck mass, [place]/AT  Cardiovascular: RRR, S1/S2, no rubs, no gallops  Respiratory: CTA bilaterally, no wheezing, no crackles, no rhonchi  Abdomen: Soft/+BS, non tender, non distended, no guarding; no hepatosplenomegaly  Extremities: No edema, No lymphangitis, No petechiae, No rashes, no synovitis; right foot wrapped  Data Reviewed: Basic Metabolic Panel:  Recent Labs Lab 08/06/15 1327 08/07/15 1349 08/08/15 0540 08/09/15 0545 08/10/15 0546  NA 138 138 140 137 140  K 5.9* 5.5* 5.0 4.2 3.6  CL 109 110 111 108 108  CO2 21* 16* 17* 18* 21*  GLUCOSE 164* 145* 73 88 124*  BUN 43* 36* 33* 27* 27*  CREATININE 2.39* 2.38* 2.21* 2.06* 1.94*  CALCIUM 9.2 9.1 8.7* 8.6* 8.6*  PHOS  --   --   --  3.8 3.1   Liver Function Tests:  Recent Labs Lab 08/06/15 1327 08/07/15 1349 08/09/15 0545 08/10/15 0546  AST 11* 13*  --   --   ALT 10* 8*  --   --   ALKPHOS 59 75  --   --   BILITOT 0.6 0.7  --   --   PROT 6.7 6.3*  --   --   ALBUMIN 2.4* 2.0* 1.7* 1.6*   No results for input(s): LIPASE, AMYLASE in the last 168 hours. No results for input(s): AMMONIA in the last 168 hours. CBC:  Recent Labs Lab 08/06/15 1208 08/07/15 1349 08/08/15 0919 08/09/15 0545 08/10/15 0546  WBC 18.1* 19.7* 22.6* 21.4* 17.1*  NEUTROABS 14.1* 16.7*  --   --   --   HGB 9.4* 9.6* 8.2* 8.4* 8.3*  HCT 30.2* 31.0* 26.4* 27.6* 26.5*  MCV 84.8 85.6 84.6 84.4 83.9  PLT 317 328 296 313 292    Cardiac Enzymes: No results for input(s): CKTOTAL, CKMB, CKMBINDEX, TROPONINI in the last 168 hours. BNP: Invalid input(s): POCBNP CBG:  Recent Labs Lab 08/09/15 1136 08/09/15 1714 08/09/15 2041 08/10/15 0825 08/10/15 1201  GLUCAP 105* 119* 177* 108* 124*    Recent Results (from the past 240 hour(s))  Culture, blood (single)     Status: None (Preliminary result)   Collection Time: 08/06/15 12:08 PM  Result Value Ref Range Status   Specimen Description BLOOD LEFT ARM  Final   Special Requests BOTTLES DRAWN AEROBIC AND ANAEROBIC 3CC  Final   Culture   Final    NO GROWTH 4 DAYS Performed at [facility]    Report Status PENDING  Incomplete  Wound culture     Status: None   Collection Time: 08/06/15 12:21 PM  Result Value Ref Range Status   Specimen Description WOUND RIGHT FOOT TOE  Final   Special Requests Normal  Final   Gram Stain   Final    NO WBC SEEN NO SQUAMOUS EPITHELIAL CELLS SEEN ABUNDANT GRAM POSITIVE COCCI IN PAIRS ABUNDANT GRAM NEGATIVE  COCCOBACILLI Performed at [facility]   Final    FEW DIPHTHEROIDS(CORYNEBACTERIUM SPECIES) Note: Standardized susceptibility testing for this organism is not available. Performed at [facility]    Report Status 08/08/2015 FINAL  Final  Surgical pcr screen     Status: None   Collection Time: 08/08/15 12:31 PM  Result Value Ref Range Status   MRSA, PCR NEGATIVE NEGATIVE Final   Staphylococcus aureus NEGATIVE NEGATIVE Final    Comment:        The Xpert SA Assay (FDA approved for NASAL specimens in patients over 36 years of age), is one component of a comprehensive surveillance program.  Test performance has been validated by [facility] for patients greater than or equal to 7 year old. It is not intended to diagnose infection nor to guide or monitor treatment.      Scheduled Meds: . atorvastatin  20 mg Oral q1800  . calcitRIOL  0.5 mcg Oral Daily  . [START ON 08/11/2015]   ceFAZolin (ANCEF) IV  2 g Intravenous To SS-Surg  . ceFEPime (MAXIPIME) IV  2 g Intravenous Q24H  . [START ON 08/11/2015] chlorhexidine  60 mL Topical Once  . darbepoetin (ARANESP) injection - NON-DIALYSIS  100 mcg Subcutaneous Q Sat-1800  . feeding supplement (PRO-STAT SUGAR FREE 64)  30 mL Oral TID  . furosemide  40 mg Oral Daily  . heparin  5,000 Units Subcutaneous 3 times per day  . insulin aspart  0-5 Units Subcutaneous QHS  . insulin aspart  0-9 Units Subcutaneous TID WC  . insulin glargine  20 Units Subcutaneous Daily  . metronidazole  500 mg Intravenous Q8H  . mycophenolate  720 mg Oral BID  . predniSONE  5 mg Oral Q breakfast  . sodium bicarbonate  650 mg Oral BID  . tacrolimus  4 mg Oral BID  . vancomycin  1,250 mg Intravenous Q24H   Continuous Infusions: . sodium chloride Stopped (08/08/15 1505)     Zebediah Beezley, DO  [facility] Pager [phone]  If 7PM-7AM, please contact night-coverage [email or web address] Password [email or web address] 08/10/2015, 5:14 PM   LOS: 4 days

## 2015-08-10 NOTE — Progress Notes (Signed)
Pharmacy Antibiotic Note  Trevor Thompson is a 55 y.o. male admitted on 08/06/2015 with progressive deterioration of right foot wound with odor noted. PMH includes DM, L BKA, and s/p renal transplant. Given clinical history and transplant status, MD wanting to cover MRSA and pseudomonas. Continuing on vancomycin, cefepime, and flagyl per pharmacy D#4. Had transmetatarsal amputation on 2/3. Plan is to return to OR on 2/6. Afebrile, WBC down to 17.1, normalized CrCl~43 ml/min.  Plan: Vancomycin 1250 mg IV q24h Cefepime to 2g IV q24h for renal function Flagyl 500 mg IV q8h Monitor clinical progress, c/s, renal function, abx plan/LOT VT@SS  as indicated  Height:  (180.3 cm) Weight: 228 lb 2.8 oz (103.5 kg) IBW/kg (Calculated) : 75.3  Temp (24hrs), Avg:98.5 F (36.9 C), Min:97.9 F (36.6 C), Max:99.2 F (37.3 C)   Recent Labs Lab 08/06/15 1208 08/06/15 1327 08/07/15 1349 08/08/15 0540 08/08/15 0919 08/09/15 0545 08/10/15 0546  WBC 18.1*  --  19.7*  --  22.6* 21.4* 17.1*  CREATININE  --  2.39* 2.38* 2.21*  --  2.06* 1.94*    Estimated Creatinine Clearance: 52.7 mL/min (by C-G formula based on Cr of 1.94).    Allergies  Allergen Reactions  . Penicillins Other (See Comments)    Has patient had a PCN reaction causing immediate rash, facial/tongue/throat swelling, SOB or lightheadedness with hypotension: No Has patient had a PCN reaction causing severe rash involving mucus membranes or skin necrosis: No Has patient had a PCN reaction that required hospitalization. No Has patient had a PCN reaction occurring within the last 10 years: No If all of the above answers are "NO", then may proceed with Cephalosporin use.     Antimicrobials this admission: 2/1 vancomycin >>  2/1 aztreonam x 1  2/1 cefepime >>  2/1 metronidazole >>   Dose adjustments this admission: none  Microbiology results: 2/1 BCx1: ngtd  2/1 wound Cx: diphtheroids  MRSA/Staph PCR neg  Thank you for  allowing pharmacy to be a part of this patient's care.  [place], [address].D., BCPS Clinical Pharmacist Pager: [phone] 08/10/2015 11:58 AM

## 2015-08-10 NOTE — Progress Notes (Signed)
Patient ID: Trevor Thompson, male   DOB: 21-May-1961, 55 y.o.   MRN: [id number]  [facility] Progress Note   Assessment/ Plan:   1. Right foot osteomyelitis/nonhealing ulcer status post second toe amputation: POD #2 s/p Transmetatarsal amputation as definitive management for osteomyelitis with local soft tissue infection/abscess in the forefoot. Denies any major complaints and looking forward to trying to mobilize/ambulate. 2. Chronic kidney disease stage III T (status post kidney transplant 13 years ago in [place] and recent acute on chronic renal failure with higher baseline). Intravenous fluids discontinued yesterday-creatinine now seems to be around 2.0 without any associated acute electrolyte abnormalities. Excellent urine output, Prograf level is pending. 3. Hyperkalemia: Corrected and now within normal limits-may need to replace if less than 3.5. Started on sodium bicarbonate by mouth. 4. Anemia: Anemia of chronic kidney disease/chronic infection/inflammation. Iron studies show low iron saturation but prohibitively high ferritin-we'll give Aranesp. No acute indications for blood transfusion  5. Hypertension: Blood pressures appear to be satisfactorily controlled at this point, continue to monitor closely.  Subjective:   Reports to be feeling comfortable-denies any chest pain or shortness of breath.    Objective:   BP 137/72 mmHg  Pulse 101  Temp(Src) 98.4 F (36.9 C) (Oral)  Resp 18  Ht  (1.803 m)  Wt 103.5 kg (228 lb 2.8 oz)  BMI 31.84 kg/m2  SpO2 100%  Intake/Output Summary (Last 24 hours) at 08/10/15 1030 Last data filed at 08/10/15 0700  Gross per 24 hour  Intake    920 ml  Output   1902 ml  Net   -982 ml   Weight change: -1.5 kg (-3 lb 4.9 oz)  Physical Exam: WGN:FAOZHYQMVHQ resting in bed watching television ION:GEXBM regular tachy, ESM over apex Resp:CTA bilaterally, no rales/rhonchi WUX:LKGM, obese, NT, BS normal Ext:s/p left BKA Status post  right transmetatarsal amputation with forefoot stump/foot in dressing.  Imaging: Dg Foot 2 Views Right  08/08/2015  CLINICAL DATA:  Status post right foot amputation EXAM: RIGHT FOOT - 2 VIEW COMPARISON:  08/01/2015 FINDINGS: The patient is status post right forefoot amputation at the level of the base of the metatarsal bones. There are no complications identified. Overlying bandage material is noted. Vascular calcifications are present. IMPRESSION: 1. Status post forefoot amputation without complication. Electronically Signed   By: Signa Kell M.D.   On: 08/08/2015 16:19    Labs: BMET  Recent Labs Lab 08/06/15 1327 08/07/15 1349 08/08/15 0540 08/09/15 0545 08/10/15 0546  NA 138 138 140 137 140  K 5.9* 5.5* 5.0 4.2 3.6  CL 109 110 111 108 108  CO2 21* 16* 17* 18* 21*  GLUCOSE 164* 145* 73 88 124*  BUN 43* 36* 33* 27* 27*  CREATININE 2.39* 2.38* 2.21* 2.06* 1.94*  CALCIUM 9.2 9.1 8.7* 8.6* 8.6*  PHOS  --   --   --  3.8 3.1   CBC  Recent Labs Lab 08/06/15 1208 08/07/15 1349 08/08/15 0919 08/09/15 0545 08/10/15 0546  WBC 18.1* 19.7* 22.6* 21.4* 17.1*  NEUTROABS 14.1* 16.7*  --   --   --   HGB 9.4* 9.6* 8.2* 8.4* 8.3*  HCT 30.2* 31.0* 26.4* 27.6* 26.5*  MCV 84.8 85.6 84.6 84.4 83.9  PLT 317 328 296 313 292    Medications:    . atorvastatin  20 mg Oral q1800  . calcitRIOL  0.5 mcg Oral Daily  . ceFEPime (MAXIPIME) IV  2 g Intravenous Q24H  . darbepoetin (ARANESP) injection - NON-DIALYSIS  100 mcg Subcutaneous Q Sat-1800  . feeding supplement (PRO-STAT SUGAR FREE 64)  30 mL Oral TID  . furosemide  40 mg Oral Daily  . heparin  5,000 Units Subcutaneous 3 times per day  . insulin aspart  0-5 Units Subcutaneous QHS  . insulin aspart  0-9 Units Subcutaneous TID WC  . insulin glargine  20 Units Subcutaneous Daily  . metronidazole  500 mg Intravenous Q8H  . mycophenolate  720 mg Oral BID  . predniSONE  5 mg Oral Q breakfast  . sodium bicarbonate  650 mg Oral BID  .  tacrolimus  4 mg Oral BID  . vancomycin  1,250 mg Intravenous Q24H   Zetta Bills, MD 08/10/2015, 10:30 AM

## 2015-08-11 ENCOUNTER — Inpatient Hospital Stay (HOSPITAL_COMMUNITY): Payer: Medicare Other | Admitting: Certified Registered Nurse Anesthetist

## 2015-08-11 ENCOUNTER — Encounter (HOSPITAL_COMMUNITY): Payer: Self-pay | Admitting: Anesthesiology

## 2015-08-11 ENCOUNTER — Encounter (HOSPITAL_COMMUNITY)
Admission: EM | Disposition: A | Discharge status: Home Health | Payer: Self-pay | Source: Home / Self Care | Attending: Internal Medicine

## 2015-08-11 HISTORY — PX: I & D EXTREMITY: SHX5045

## 2015-08-11 LAB — CBC
HEMATOCRIT: 27.4 % — AB (ref 39.0–52.0)
HEMOGLOBIN: 8.5 g/dL — AB (ref 13.0–17.0)
MCH: 25.9 pg — ABNORMAL LOW (ref 26.0–34.0)
MCHC: 31 g/dL (ref 30.0–36.0)
MCV: 83.5 fL (ref 78.0–100.0)
Platelets: 299 10*3/uL (ref 150–400)
RBC: 3.28 MIL/uL — AB (ref 4.22–5.81)
RDW: 14.5 % (ref 11.5–15.5)
WBC: 13.8 10*3/uL — AB (ref 4.0–10.5)

## 2015-08-11 LAB — RENAL FUNCTION PANEL
Albumin: 1.6 g/dL — ABNORMAL LOW (ref 3.5–5.0)
Anion gap: 9 (ref 5–15)
BUN: 25 mg/dL — ABNORMAL HIGH (ref 6–20)
CO2: 21 mmol/L — ABNORMAL LOW (ref 22–32)
Calcium: 8.5 mg/dL — ABNORMAL LOW (ref 8.9–10.3)
Chloride: 107 mmol/L (ref 101–111)
Creatinine, Ser: 1.92 mg/dL — ABNORMAL HIGH (ref 0.61–1.24)
GFR calc Af Amer: 44 mL/min — ABNORMAL LOW
GFR calc non Af Amer: 38 mL/min — ABNORMAL LOW
Glucose, Bld: 138 mg/dL — ABNORMAL HIGH (ref 65–99)
Phosphorus: 2.8 mg/dL (ref 2.5–4.6)
Potassium: 3.5 mmol/L (ref 3.5–5.1)
Sodium: 137 mmol/L (ref 135–145)

## 2015-08-11 LAB — GLUCOSE, CAPILLARY
GLUCOSE-CAPILLARY: 115 mg/dL — AB (ref 65–99)
GLUCOSE-CAPILLARY: 119 mg/dL — AB (ref 65–99)
GLUCOSE-CAPILLARY: 204 mg/dL — AB (ref 65–99)
Glucose-Capillary: 110 mg/dL — ABNORMAL HIGH (ref 65–99)
Glucose-Capillary: 125 mg/dL — ABNORMAL HIGH (ref 65–99)

## 2015-08-11 LAB — TACROLIMUS LEVEL: TACROLIMUS (FK506) - LABCORP: 11.7 ng/mL (ref 2.0–20.0)

## 2015-08-11 LAB — CULTURE, BLOOD (SINGLE): Culture: NO GROWTH

## 2015-08-11 LAB — VANCOMYCIN, TROUGH: Vancomycin Tr: 40 ug/mL (ref 10.0–20.0)

## 2015-08-11 SURGERY — IRRIGATION AND DEBRIDEMENT EXTREMITY
Anesthesia: General | Site: Foot | Laterality: Right

## 2015-08-11 MED ORDER — FENTANYL CITRATE (PF) 100 MCG/2ML IJ SOLN
INTRAMUSCULAR | Status: DC | PRN
  Administered 2015-08-11: 25 ug via INTRAVENOUS
  Administered 2015-08-11: 100 ug via INTRAVENOUS

## 2015-08-11 MED ORDER — PHENYLEPHRINE HCL 10 MG/ML IJ SOLN
INTRAMUSCULAR | Status: DC | PRN
  Administered 2015-08-11 (×2): 80 ug via INTRAVENOUS
  Administered 2015-08-11: 120 ug via INTRAVENOUS

## 2015-08-11 MED ORDER — PHENYLEPHRINE 40 MCG/ML (10ML) SYRINGE FOR IV PUSH (FOR BLOOD PRESSURE SUPPORT)
PREFILLED_SYRINGE | INTRAVENOUS | Status: AC
  Filled 2015-08-11: qty 10

## 2015-08-11 MED ORDER — ONDANSETRON HCL 4 MG/2ML IJ SOLN
INTRAMUSCULAR | Status: AC
  Filled 2015-08-11: qty 2

## 2015-08-11 MED ORDER — FENTANYL CITRATE (PF) 250 MCG/5ML IJ SOLN
INTRAMUSCULAR | Status: AC
  Filled 2015-08-11: qty 5

## 2015-08-11 MED ORDER — PROPOFOL 10 MG/ML IV BOLUS
INTRAVENOUS | Status: DC | PRN
  Administered 2015-08-11: 200 mg via INTRAVENOUS

## 2015-08-11 MED ORDER — SODIUM CHLORIDE 0.9 % IR SOLN
Status: DC | PRN
  Administered 2015-08-11: 3000 mL

## 2015-08-11 MED ORDER — MIDAZOLAM HCL 2 MG/2ML IJ SOLN
INTRAMUSCULAR | Status: AC
  Filled 2015-08-11: qty 2

## 2015-08-11 MED ORDER — LIDOCAINE HCL (CARDIAC) 20 MG/ML IV SOLN
INTRAVENOUS | Status: AC
  Filled 2015-08-11: qty 5

## 2015-08-11 MED ORDER — LIDOCAINE HCL (CARDIAC) 20 MG/ML IV SOLN
INTRAVENOUS | Status: DC | PRN
  Administered 2015-08-11: 80 mg via INTRAVENOUS

## 2015-08-11 MED ORDER — ONDANSETRON HCL 4 MG/2ML IJ SOLN
INTRAMUSCULAR | Status: DC | PRN
  Administered 2015-08-11: 4 mg via INTRAVENOUS

## 2015-08-11 MED ORDER — MIDAZOLAM HCL 5 MG/5ML IJ SOLN
INTRAMUSCULAR | Status: DC | PRN
  Administered 2015-08-11: 2 mg via INTRAVENOUS

## 2015-08-11 MED ORDER — HEPARIN SODIUM (PORCINE) 5000 UNIT/ML IJ SOLN
5000.0000 [IU] | Freq: Three times a day (TID) | INTRAMUSCULAR | Status: DC
  Administered 2015-08-12 – 2015-08-13 (×6): 5000 [IU] via SUBCUTANEOUS
  Filled 2015-08-11 (×5): qty 1

## 2015-08-11 MED ORDER — PROPOFOL 10 MG/ML IV BOLUS
INTRAVENOUS | Status: AC
  Filled 2015-08-11: qty 20

## 2015-08-11 SURGICAL SUPPLY — 57 items
BAG DECANTER FOR FLEXI CONT (MISCELLANEOUS) IMPLANT
BANDAGE ACE 4X5 VEL STRL LF (GAUZE/BANDAGES/DRESSINGS) ×6 IMPLANT
BANDAGE ELASTIC 4 VELCRO ST LF (GAUZE/BANDAGES/DRESSINGS) ×3 IMPLANT
BANDAGE ELASTIC 6 VELCRO ST LF (GAUZE/BANDAGES/DRESSINGS) ×3 IMPLANT
BANDAGE ESMARK 6X9 LF (GAUZE/BANDAGES/DRESSINGS) IMPLANT
BNDG COHESIVE 4X5 TAN STRL (GAUZE/BANDAGES/DRESSINGS) ×3 IMPLANT
BNDG ESMARK 6X9 LF (GAUZE/BANDAGES/DRESSINGS)
BNDG GAUZE ELAST 4 BULKY (GAUZE/BANDAGES/DRESSINGS) ×3 IMPLANT
CUFF TOURNIQUET SINGLE 18IN (TOURNIQUET CUFF) ×3 IMPLANT
CUFF TOURNIQUET SINGLE 24IN (TOURNIQUET CUFF) IMPLANT
CUFF TOURNIQUET SINGLE 34IN LL (TOURNIQUET CUFF) IMPLANT
CUFF TOURNIQUET SINGLE 44IN (TOURNIQUET CUFF) IMPLANT
DRAIN PENROSE 1/4X12 LTX STRL (WOUND CARE) ×3 IMPLANT
DRAPE U-SHAPE 47X51 STRL (DRAPES) ×3 IMPLANT
DRSG ADAPTIC 3X8 NADH LF (GAUZE/BANDAGES/DRESSINGS) ×3 IMPLANT
DRSG EMULSION OIL 3X3 NADH (GAUZE/BANDAGES/DRESSINGS) ×3 IMPLANT
DRSG PAD ABDOMINAL 8X10 ST (GAUZE/BANDAGES/DRESSINGS) ×6 IMPLANT
DURAPREP 26ML APPLICATOR (WOUND CARE) ×3 IMPLANT
ELECT CAUTERY BLADE 6.4 (BLADE) IMPLANT
ELECT REM PT RETURN 9FT ADLT (ELECTROSURGICAL)
ELECTRODE REM PT RTRN 9FT ADLT (ELECTROSURGICAL) IMPLANT
GAUZE SPONGE 4X4 12PLY STRL (GAUZE/BANDAGES/DRESSINGS) ×3 IMPLANT
GAUZE XEROFORM 1X8 LF (GAUZE/BANDAGES/DRESSINGS) ×3 IMPLANT
GAUZE XEROFORM 5X9 LF (GAUZE/BANDAGES/DRESSINGS) ×3 IMPLANT
GLOVE BIOGEL PI IND STRL 8 (GLOVE) ×2 IMPLANT
GLOVE BIOGEL PI INDICATOR 8 (GLOVE) ×4
GLOVE ECLIPSE 7.5 STRL STRAW (GLOVE) ×6 IMPLANT
GOWN STRL REUS W/ TWL LRG LVL3 (GOWN DISPOSABLE) ×2 IMPLANT
GOWN STRL REUS W/ TWL XL LVL3 (GOWN DISPOSABLE) ×2 IMPLANT
GOWN STRL REUS W/TWL LRG LVL3 (GOWN DISPOSABLE) ×4
GOWN STRL REUS W/TWL XL LVL3 (GOWN DISPOSABLE) ×4
HANDPIECE INTERPULSE COAX TIP (DISPOSABLE) ×2
KIT BASIN OR (CUSTOM PROCEDURE TRAY) ×3 IMPLANT
KIT ROOM TURNOVER OR (KITS) ×3 IMPLANT
MANIFOLD NEPTUNE II (INSTRUMENTS) ×3 IMPLANT
NS IRRIG 1000ML POUR BTL (IV SOLUTION) ×3 IMPLANT
PACK ORTHO EXTREMITY (CUSTOM PROCEDURE TRAY) ×3 IMPLANT
PAD ABD 8X10 STRL (GAUZE/BANDAGES/DRESSINGS) ×3 IMPLANT
PAD ARMBOARD 7.5X6 YLW CONV (MISCELLANEOUS) ×6 IMPLANT
PAD CAST 4YDX4 CTTN HI CHSV (CAST SUPPLIES) ×1 IMPLANT
PADDING CAST COTTON 4X4 STRL (CAST SUPPLIES) ×2
SET HNDPC FAN SPRY TIP SCT (DISPOSABLE) ×1 IMPLANT
SPONGE GAUZE 4X4 12PLY STER LF (GAUZE/BANDAGES/DRESSINGS) ×3 IMPLANT
SPONGE LAP 18X18 X RAY DECT (DISPOSABLE) ×3 IMPLANT
STOCKINETTE IMPERVIOUS 9X36 MD (GAUZE/BANDAGES/DRESSINGS) ×3 IMPLANT
SUT ETHILON 3 0 PS 1 (SUTURE) ×6 IMPLANT
SUT ETHILON O TP 1 (SUTURE) ×3 IMPLANT
SUT VIC AB 2-0 CT1 27 (SUTURE) ×4
SUT VIC AB 2-0 CT1 TAPERPNT 27 (SUTURE) ×2 IMPLANT
TOWEL OR 17X24 6PK STRL BLUE (TOWEL DISPOSABLE) ×3 IMPLANT
TOWEL OR 17X26 10 PK STRL BLUE (TOWEL DISPOSABLE) ×3 IMPLANT
TUBE ANAEROBIC SPECIMEN COL (MISCELLANEOUS) IMPLANT
TUBE CONNECTING 12'X1/4 (SUCTIONS) ×1
TUBE CONNECTING 12X1/4 (SUCTIONS) ×2 IMPLANT
UNDERPAD 30X30 INCONTINENT (UNDERPADS AND DIAPERS) ×3 IMPLANT
WATER STERILE IRR 1000ML POUR (IV SOLUTION) ×3 IMPLANT
YANKAUER SUCT BULB TIP NO VENT (SUCTIONS) ×3 IMPLANT

## 2015-08-11 NOTE — Brief Op Note (Signed)
08/06/2015 - 08/11/2015  5:58 PM  PATIENT:  Trevor Thompson  55 y.o. male  PRE-OPERATIVE DIAGNOSIS:  Infected wound Right foot  POST-OPERATIVE DIAGNOSIS:  Infected wound Right foot  PROCEDURE:  Procedure(s): EXCISIONAL DEBRIDEMENT OF SKIN, TISSUE, BONE OF RIGHT METATARSAL AND WOUND CLOSURE OF RIGHT FOOT.  (Right)  SURGEON:  Surgeon(s) and Role:    * Jodi Geralds, MD - Primary  PHYSICIAN ASSISTANT:   ASSISTANTS: bethune   ANESTHESIA:   general  EBL:  Total I/O In: 620 [P.O.:120; I.V.:500] Out: 500 [Urine:500]  BLOOD ADMINISTERED:none  DRAINS: Penrose drain in the r foot   LOCAL MEDICATIONS USED:  NONE  SPECIMEN:  No Specimen  DISPOSITION OF SPECIMEN:  N/A  COUNTS:  YES  TOURNIQUET:  * No tourniquets in log *  DICTATION: .Other Dictation: Dictation Number [id number]  PLAN OF CARE: Admit to inpatient   PATIENT DISPOSITION:  PACU - hemodynamically stable.   Delay start of Pharmacological VTE agent (>24hrs) due to surgical blood loss or risk of bleeding: no

## 2015-08-11 NOTE — Transfer of Care (Signed)
Immediate Anesthesia Transfer of Care Note  Patient: Trevor Thompson  Procedure(s) Performed: Procedure(s): EXCISIONAL DEBRIDEMENT OF SKIN, TISSUE, BONE OF RIGHT METATARSAL AND WOUND CLOSURE OF RIGHT FOOT.  (Right)  Patient Location: PACU  Anesthesia Type:General  Level of Consciousness: awake, alert , oriented and patient cooperative  Airway & Oxygen Therapy: Patient Spontanous Breathing and Patient connected to nasal cannula oxygen  Post-op Assessment: Report given to RN and Post -op Vital signs reviewed and stable  Post vital signs: Reviewed and stable  Last Vitals:  Filed Vitals:   08/11/15 0811 08/11/15 1810  BP: 156/86   Pulse: 89   Temp: 36.9 C 36.9 C  Resp: 20     Complications: No apparent anesthesia complications

## 2015-08-11 NOTE — Progress Notes (Signed)
PROGRESS NOTE  Trevor Thompson CWU:[id number] DOB: [birth year] DOA: 08/06/2015 PCP: Minerva Ends, MD  Brief History 55 y/o with a history of renal transplantation, peripheral vascular disease, hypertension, diabetes mellitus type 2, and left BKA presented with worsening diabetic foot infection. The patient recently had amputation of his right second toe in [place] in December 2016. He stated that the wound never healed, and he had been going to the wound care center on a daily basis. In the past 2 weeks, there has been increasing edema and drainage about his right foot. The patient states that he has not been placed on any antibiotics recently. The patient saw his orthopedist, Dr. Berenice Primas, in office approximately one week prior to admission and MRI was done at an outside facility. Assessment/Plan: Sepsis -present at the time of admission -pt had tachycardia HR>110 and WBC 18.1 -secondary to foot infection -pt had fluids at 150cc/hr -blood cultures negative Acute osteomyelitis right foot -positive probe to bone test with purulent drainage noted -appreciate ortho follow up -R-TMA on 08/08/15 -08/11/15--revision of TMA, repeat debridement, closure -08/01/2015 MRI R- foot--abnormal for collection adjacent to the second, third, and fourth distal metatarsals; also with abnormal edema in the extensor tendon sheaths of the fourth and fifth metatarsals; it was felt that the fluid collections likely represented pus; no frank radiographic features of osteomyelitis -ESR--123 -CRP--17.2 -Continue vancomycin, cefepime, metronidazole -nonsurgical wound culture--not reliable CKD stage III with renal transplantation  -Appreciate Nephrology -status post kidney transplant 13 years ago in [place]  -Continue tacrolimus and mycophenolate  -Continue maintenance prednisone  -Previous Baseline creatinine 2.1-2.3  -Continue furosemide -bicarbonate drip-->oral bicarb Diabetes mellitus type 2   -Hemoglobin A1c--pending -Continue Lantus-->reduce dose to 20 units -CBGs well controlled  -NovoLog sliding scale  Hyperlipidemia  -Continue statin  Hyperkalemia -improved with fluids/kayexalate Severe protein calorie malnutrition -Nutritional supplementation Diarrhea -cancel cdiff--no further BMs  Family Communication: family update at beside 08/09/15 Disposition Plan: Home 2/7 if cleared by ortho        Procedures/Studies: Portable Chest 1 View  08/06/2015  CLINICAL DATA:  55 year old male under preoperative evaluation prior to right foot amputation. EXAM: PORTABLE CHEST 1 VIEW COMPARISON:  Chest x-ray 08/22/2014. FINDINGS: Linear scarring in the right mid lung is unchanged. No acute consolidative airspace disease. No pleural effusions. Chronic elevation of the right hemidiaphragm is unchanged. No evidence of pulmonary edema. Heart size is borderline enlarged. Upper mediastinal contours are within normal limits. Atherosclerosis in the thoracic aorta. IMPRESSION: 1. No radiographic evidence of acute cardiopulmonary disease. 2. Atherosclerosis. Electronically Signed   By: Vinnie Langton M.D.   On: 08/06/2015 17:01   Dg Foot 2 Views Right  08/08/2015  CLINICAL DATA:  Status post right foot amputation EXAM: RIGHT FOOT - 2 VIEW COMPARISON:  08/01/2015 FINDINGS: The patient is status post right forefoot amputation at the level of the base of the metatarsal bones. There are no complications identified. Overlying bandage material is noted. Vascular calcifications are present. IMPRESSION: 1. Status post forefoot amputation without complication. Electronically Signed   By: Kerby Moors M.D.   On: 08/08/2015 16:19         Subjective: Patient denies fevers, chills, headache, chest pain, dyspnea, nausea, vomiting, diarrhea, abdominal pain, dysuria, hematuria   Objective: Filed Vitals:   08/11/15 0557 08/11/15 0811 08/11/15 1810 08/11/15 1815  BP: 128/56 156/86  129/73   Pulse: 96 89  93  Temp: 99.5 F (37.5 C) 98.4 F (  36.9 C) 98.4 F (36.9 C)   TempSrc: Oral Oral    Resp: 19 20  15   Height:      Weight:      SpO2: 96% 96%  100%    Intake/Output Summary (Last 24 hours) at 08/11/15 1846 Last data filed at 08/11/15 1815  Gross per 24 hour  Intake    860 ml  Output   1050 ml  Net   -190 ml   Weight change: 0.1 kg (3.5 oz) Exam:   General:  Pt is alert, follows commands appropriately, not in acute distress  HEENT: No icterus, No thrush, No neck mass, [place]/AT  Cardiovascular: RRR, S1/S2, no rubs, no gallops  Respiratory: CTA bilaterally, no wheezing, no crackles, no rhonchi  Abdomen: Soft/+BS, non tender, non distended, no guarding  Extremities: No edema, No lymphangitis, No petechiae, No rashes, no synovitis  Data Reviewed: Basic Metabolic Panel:  Recent Labs Lab 08/07/15 1349 08/08/15 0540 08/09/15 0545 08/10/15 0546 08/11/15 0628  NA 138 140 137 140 137  K 5.5* 5.0 4.2 3.6 3.5  CL 110 111 108 108 107  CO2 16* 17* 18* 21* 21*  GLUCOSE 145* 73 88 124* 138*  BUN 36* 33* 27* 27* 25*  CREATININE 2.38* 2.21* 2.06* 1.94* 1.92*  CALCIUM 9.1 8.7* 8.6* 8.6* 8.5*  PHOS  --   --  3.8 3.1 2.8   Liver Function Tests:  Recent Labs Lab 08/06/15 1327 08/07/15 1349 08/09/15 0545 08/10/15 0546 08/11/15 0628  AST 11* 13*  --   --   --   ALT 10* 8*  --   --   --   ALKPHOS 59 75  --   --   --   BILITOT 0.6 0.7  --   --   --   PROT 6.7 6.3*  --   --   --   ALBUMIN 2.4* 2.0* 1.7* 1.6* 1.6*   No results for input(s): LIPASE, AMYLASE in the last 168 hours. No results for input(s): AMMONIA in the last 168 hours. CBC:  Recent Labs Lab 08/06/15 1208 08/07/15 1349 08/08/15 0919 08/09/15 0545 08/10/15 0546 08/11/15 0628  WBC 18.1* 19.7* 22.6* 21.4* 17.1* 13.8*  NEUTROABS 14.1* 16.7*  --   --   --   --   HGB 9.4* 9.6* 8.2* 8.4* 8.3* 8.5*  HCT 30.2* 31.0* 26.4* 27.6* 26.5* 27.4*  MCV 84.8 85.6 84.6 84.4 83.9 83.5  PLT 317 328 296  313 292 299   Cardiac Enzymes: No results for input(s): CKTOTAL, CKMB, CKMBINDEX, TROPONINI in the last 168 hours. BNP: Invalid input(s): POCBNP CBG:  Recent Labs Lab 08/10/15 2154 08/11/15 0714 08/11/15 1107 08/11/15 1613 08/11/15 1813  GLUCAP 149* 110* 125* 115* 119*    Recent Results (from the past 240 hour(s))  Culture, blood (single)     Status: None   Collection Time: 08/06/15 12:08 PM  Result Value Ref Range Status   Specimen Description BLOOD LEFT ARM  Final   Special Requests BOTTLES DRAWN AEROBIC AND ANAEROBIC 3CC  Final   Culture   Final    NO GROWTH 5 DAYS Performed at [facility]    Report Status 08/11/2015 FINAL  Final  Wound culture     Status: None   Collection Time: 08/06/15 12:21 PM  Result Value Ref Range Status   Specimen Description WOUND RIGHT FOOT TOE  Final   Special Requests Normal  Final   Gram Stain   Final    NO WBC  SEEN NO SQUAMOUS EPITHELIAL CELLS SEEN ABUNDANT GRAM POSITIVE COCCI IN PAIRS ABUNDANT GRAM NEGATIVE COCCOBACILLI Performed at [facility]    Culture   Final    FEW DIPHTHEROIDS(CORYNEBACTERIUM SPECIES) Note: Standardized susceptibility testing for this organism is not available. Performed at [facility]    Report Status 08/08/2015 FINAL  Final  Surgical pcr screen     Status: None   Collection Time: 08/08/15 12:31 PM  Result Value Ref Range Status   MRSA, PCR NEGATIVE NEGATIVE Final   Staphylococcus aureus NEGATIVE NEGATIVE Final    Comment:        The Xpert SA Assay (FDA approved for NASAL specimens in patients over 54 years of age), is one component of a comprehensive surveillance program.  Test performance has been validated by [facility] for patients greater than or equal to 40 year old. It is not intended to diagnose infection nor to guide or monitor treatment.      Scheduled Meds: . atorvastatin  20 mg Oral q1800  . calcitRIOL  0.5 mcg Oral Daily  . ceFEPime (MAXIPIME) IV  2  g Intravenous Q24H  . darbepoetin (ARANESP) injection - NON-DIALYSIS  100 mcg Subcutaneous Q Sat-1800  . feeding supplement (PRO-STAT SUGAR FREE 64)  30 mL Oral TID  . furosemide  40 mg Oral Daily  . [START ON 08/12/2015] heparin  5,000 Units Subcutaneous 3 times per day  . insulin aspart  0-5 Units Subcutaneous QHS  . insulin aspart  0-9 Units Subcutaneous TID WC  . insulin glargine  20 Units Subcutaneous Daily  . metronidazole  500 mg Intravenous Q8H  . mycophenolate  720 mg Oral BID  . predniSONE  5 mg Oral Q breakfast  . sodium bicarbonate  650 mg Oral BID  . tacrolimus  4 mg Oral BID   Continuous Infusions: . sodium chloride 0 mL/hr at 08/08/15 1505     Eyvette Cordon, DO  [facility] Pager [phone]  If 7PM-7AM, please contact night-coverage [email or web address] Password [email or web address] 08/11/2015, 6:46 PM   LOS: 5 days

## 2015-08-11 NOTE — Anesthesia Postprocedure Evaluation (Signed)
Anesthesia Post Note  Patient: Trevor Thompson  Procedure(s) Performed: Procedure(s) (LRB): EXCISIONAL DEBRIDEMENT OF SKIN, TISSUE, BONE OF RIGHT METATARSAL AND WOUND CLOSURE OF RIGHT FOOT.  (Right)  Patient location during evaluation: PACU Anesthesia Type: General Level of consciousness: awake, awake and alert, oriented and patient cooperative Pain management: pain level controlled Vital Signs Assessment: post-procedure vital signs reviewed and stable Respiratory status: spontaneous breathing and respiratory function stable Cardiovascular status: blood pressure returned to baseline Anesthetic complications: no    Last Vitals:  Filed Vitals:   08/11/15 1810 08/11/15 1815  BP:  129/73  Pulse: 91 93  Temp: 36.9 C   Resp: 16 15    Last Pain:  Filed Vitals:   08/11/15 1848  PainSc: 0-No pain                 Louise Victory EDWARD

## 2015-08-11 NOTE — Anesthesia Preprocedure Evaluation (Signed)
Anesthesia Evaluation  Patient identified by MRN, date of birth, ID band Patient awake    Reviewed: Allergy & Precautions, NPO status , Patient's Chart, lab work & pertinent test results  Airway Mallampati: I  TM Distance: >3 FB     Dental   Pulmonary sleep apnea , former smoker,    Pulmonary exam normal  + decreased breath sounds      Cardiovascular hypertension, + Peripheral Vascular Disease  Normal cardiovascular exam     Neuro/Psych  Neuromuscular disease    GI/Hepatic GERD  ,  Endo/Other  diabetes, Type 2, Insulin Dependent, Oral Hypoglycemic AgentsMorbid obesity  Renal/GU CRF and DialysisRenal disease     Musculoskeletal   Abdominal   Peds  Hematology   Anesthesia Other Findings   Reproductive/Obstetrics                             Anesthesia Physical Anesthesia Plan  ASA: III  Anesthesia Plan: General   Post-op Pain Management:    Induction: Intravenous  Airway Management Planned: LMA  Additional Equipment:   Intra-op Plan:   Post-operative Plan: Extubation in OR  Informed Consent: I have reviewed the patients History and Physical, chart, labs and discussed the procedure including the risks, benefits and alternatives for the proposed anesthesia with the patient or authorized representative who has indicated his/her understanding and acceptance.     Plan Discussed with: CRNA, Anesthesiologist and Surgeon  Anesthesia Plan Comments:         Anesthesia Quick Evaluation

## 2015-08-11 NOTE — Progress Notes (Signed)
S: No new CO O:BP 156/86 mmHg  Pulse 89  Temp(Src) 98.4 F (36.9 C) (Oral)  Resp 20  Ht  (1.803 m)  Wt 103.6 kg (228 lb 6.3 oz)  BMI 31.87 kg/m2  SpO2 96%  Intake/Output Summary (Last 24 hours) at 08/11/15 1240 Last data filed at 08/11/15 0900  Gross per 24 hour  Intake   1430 ml  Output   1575 ml  Net   -145 ml   Weight change: 0.1 kg (3.5 oz) QQV:ZDGLO and alert CVS:RRR Resp:Clear Abd:+ BS NTND Ext: Lt BKA, Rt transmet, foot wrapped NEURO:CNI Ox3 No asterixis   . atorvastatin  20 mg Oral q1800  . calcitRIOL  0.5 mcg Oral Daily  . ceFEPime (MAXIPIME) IV  2 g Intravenous Q24H  . chlorhexidine  60 mL Topical Once  . darbepoetin (ARANESP) injection - NON-DIALYSIS  100 mcg Subcutaneous Q Sat-1800  . feeding supplement (PRO-STAT SUGAR FREE 64)  30 mL Oral TID  . furosemide  40 mg Oral Daily  . heparin  5,000 Units Subcutaneous 3 times per day  . insulin aspart  0-5 Units Subcutaneous QHS  . insulin aspart  0-9 Units Subcutaneous TID WC  . insulin glargine  20 Units Subcutaneous Daily  . metronidazole  500 mg Intravenous Q8H  . mycophenolate  720 mg Oral BID  . predniSONE  5 mg Oral Q breakfast  . sodium bicarbonate  650 mg Oral BID  . tacrolimus  4 mg Oral BID  . vancomycin  1,250 mg Intravenous Q24H   No results found. BMET    Component Value Date/Time   NA 137 08/11/2015 0628   K 3.5 08/11/2015 0628   CL 107 08/11/2015 0628   CO2 21* 08/11/2015 0628   GLUCOSE 138* 08/11/2015 0628   BUN 25* 08/11/2015 0628   CREATININE 1.92* 08/11/2015 0628   CREATININE 2.75* 04/25/2015 1312   CALCIUM 8.5* 08/11/2015 0628   GFRNONAA 38* 08/11/2015 0628   GFRNONAA 25* 04/25/2015 1312   GFRAA 44* 08/11/2015 0628   GFRAA 29* 04/25/2015 1312   CBC    Component Value Date/Time   WBC 13.8* 08/11/2015 0628   RBC 3.28* 08/11/2015 0628   HGB 8.5* 08/11/2015 0628   HCT 27.4* 08/11/2015 0628   PLT 299 08/11/2015 0628   MCV 83.5 08/11/2015 0628   MCH 25.9* 08/11/2015  0628   MCHC 31.0 08/11/2015 0628   RDW 14.5 08/11/2015 0628   LYMPHSABS 2.0 08/07/2015 1349   MONOABS 0.8 08/07/2015 1349   EOSABS 0.2 08/07/2015 1349   BASOSABS 0.0 08/07/2015 1349     Assessment: 1. Acute on CKD 3 in renal tx. (baseline Scr upper 1's) 2. SP Rt transmetatarsal amp for osteo 3. HTN 4. Anemia on aranesp  Plan: 1.  Renal fx back to baseline.  Will sign off.  He can FU with Dr Lowell Guitar as outpt   If renal fx were to worsen then please call back.   Addilyne Backs T

## 2015-08-11 NOTE — Progress Notes (Addendum)
Pharmacy Antibiotic Note  Trevor Thompson is a 55 y.o. male admitted on 08/06/2015 with progressive deterioration of right foot wound with odor noted. PMH includes DM, L BKA, and s/p renal transplant. Given clinical history and transplant status, MD wanting to cover MRSA and pseudomonas. Continuing on vancomycin, cefepime, and flagyl per pharmacy D#5. Had transmetatarsal amputation on 2/3. Plan is to return to OR today. Afebrile, WBC down to 13.8, SCr trended down to 1.92 (baseline), normalized CrCl~44 ml/min. Vancomycin trough is SUPRAtherapeutic at 40. RN already hung vancomycin dose and patient received ~45 min worth of dose.   Plan: Discontinue vancomycin - will reorder when level is in range Vancomycin level in am on 2/8 Cefepime to 2g IV q24h for renal function Flagyl 500 mg IV q8h Monitor clinical progress, c/s, renal function, abx plan/LOT  Height:  (180.3 cm) Weight: 228 lb 6.3 oz (103.6 kg) IBW/kg (Calculated) : 75.3  Temp (24hrs), Avg:98.8 F (37.1 C), Min:98.2 F (36.8 C), Max:99.5 F (37.5 C)   Recent Labs Lab 08/07/15 1349 08/08/15 0540 08/08/15 0919 08/09/15 0545 08/10/15 0546 08/11/15 0628 08/11/15 1256  WBC 19.7*  --  22.6* 21.4* 17.1* 13.8*  --   CREATININE 2.38* 2.21*  --  2.06* 1.94* 1.92*  --   VANCOTROUGH  --   --   --   --   --   --  40*    Estimated Creatinine Clearance: 53.2 mL/min (by C-G formula based on Cr of 1.92).    Allergies  Allergen Reactions  . Penicillins Other (See Comments)    Has patient had a PCN reaction causing immediate rash, facial/tongue/throat swelling, SOB or lightheadedness with hypotension: No Has patient had a PCN reaction causing severe rash involving mucus membranes or skin necrosis: No Has patient had a PCN reaction that required hospitalization. No Has patient had a PCN reaction occurring within the last 10 years: No If all of the above answers are "NO", then may proceed with Cephalosporin use.     Antimicrobials  this admission: 2/1 vancomycin >>  2/1 aztreonam x 1  2/1 cefepime >>  2/1 metronidazole >>   Dose adjustments this admission: 2/6 VT 40 (drawn 1 hr late) on 1.25g/24h - hold, RN had already infused for 45 min before stopped  Microbiology results: 2/1 BCx1: ngtd  2/1 wound Cx: diphtheroids  MRSA/Staph PCR neg  Thank you for allowing pharmacy to be a part of this patient's care.  [place], [address].D., BCPS Clinical Pharmacist Pager: [phone] 08/11/2015 2:07 PM

## 2015-08-11 NOTE — Progress Notes (Signed)
The patient is scheduled for repeat I&D and wound closure of his right foot.  There is minimal bone left of his metatarsals that can be resected.  We are trying to avoid a BKA.  Hopefully we can get this wound healed.I discussed this with the patient this morning and all questions were answered.  He is n.p.o.  Surgery will be this afternoon around 4 PM.

## 2015-08-12 ENCOUNTER — Encounter (HOSPITAL_COMMUNITY): Payer: Self-pay | Admitting: Orthopedic Surgery

## 2015-08-12 DIAGNOSIS — D849 Immunodeficiency, unspecified: Secondary | ICD-10-CM

## 2015-08-12 DIAGNOSIS — M86179 Other acute osteomyelitis, unspecified ankle and foot: Secondary | ICD-10-CM

## 2015-08-12 LAB — CBC
HCT: 27.5 % — ABNORMAL LOW (ref 39.0–52.0)
Hemoglobin: 8.1 g/dL — ABNORMAL LOW (ref 13.0–17.0)
MCH: 24.6 pg — ABNORMAL LOW (ref 26.0–34.0)
MCHC: 29.5 g/dL — AB (ref 30.0–36.0)
MCV: 83.6 fL (ref 78.0–100.0)
Platelets: 317 10*3/uL (ref 150–400)
RBC: 3.29 MIL/uL — ABNORMAL LOW (ref 4.22–5.81)
RDW: 14.8 % (ref 11.5–15.5)
WBC: 14.3 10*3/uL — ABNORMAL HIGH (ref 4.0–10.5)

## 2015-08-12 LAB — BASIC METABOLIC PANEL
Anion gap: 9 (ref 5–15)
BUN: 26 mg/dL — AB (ref 6–20)
CALCIUM: 8.4 mg/dL — AB (ref 8.9–10.3)
CO2: 21 mmol/L — ABNORMAL LOW (ref 22–32)
Chloride: 108 mmol/L (ref 101–111)
Creatinine, Ser: 2.01 mg/dL — ABNORMAL HIGH (ref 0.61–1.24)
GFR calc Af Amer: 41 mL/min — ABNORMAL LOW (ref >60)
GFR, EST NON AFRICAN AMERICAN: 36 mL/min — AB (ref >60)
GLUCOSE: 111 mg/dL — AB (ref 65–99)
Potassium: 3.5 mmol/L (ref 3.5–5.1)
Sodium: 138 mmol/L (ref 135–145)

## 2015-08-12 LAB — GLUCOSE, CAPILLARY
GLUCOSE-CAPILLARY: 96 mg/dL (ref 65–99)
Glucose-Capillary: 155 mg/dL — ABNORMAL HIGH (ref 65–99)
Glucose-Capillary: 185 mg/dL — ABNORMAL HIGH (ref 65–99)
Glucose-Capillary: 152 mg/dL — ABNORMAL HIGH (ref 65–99)

## 2015-08-12 MED ORDER — OXYCODONE-ACETAMINOPHEN 5-325 MG PO TABS: 1.0000 | ORAL_TABLET | ORAL | Status: DC | PRN

## 2015-08-12 MED ORDER — GABAPENTIN 600 MG PO TABS
300.0000 mg | ORAL_TABLET | Freq: Every day | ORAL | Status: DC
  Administered 2015-08-12 – 2015-08-13 (×2): 300 mg via ORAL
  Filled 2015-08-12 (×2): qty 1

## 2015-08-12 NOTE — Progress Notes (Signed)
Orthopedic Tech Progress Note Patient Details:  Trevor Thompson 28-Feb-1961 [id number]  Ortho Devices Type of Ortho Device: Postop shoe/boot Ortho Device/Splint Interventions: Application   Saul Fordyce 08/12/2015, 7:53 AM

## 2015-08-12 NOTE — Evaluation (Signed)
Physical Therapy Evaluation Patient Details Name: Trevor Thompson MRN: [id number] DOB: 1960/08/18 Today's Date: 08/12/2015   History of Present Illness  55 y/o with a history of renal transplantation, peripheral vascular disease, hypertension, diabetes mellitus type 2, and left BKA presented with worsening diabetic foot infection. The patient recently had amputation of his right second toe in [place] in December 2016. He stated that the wound never healed, and he had been going to the wound care center on a daily basis.  Underwent R foot transmet amputation and closed 2/6  Clinical Impression  Pt presenting today POD #1 of closure of R transmet amputation. Pt c/o frequent dizziness throughout session yet tolerated ambulating ~75 ft with RW with Min A maintaining WBing through R heel in Post-op shoe. Pt limited today by the dizziness however believes once that is under control he will be moving much better and PT agrees. Deferred stair training today but will continue to follow patient acutely to achieve these goals for safe transition home with HHPT and assistance from daughter and family.     Follow Up Recommendations Home health PT;Supervision/Assistance - 24 hour    Equipment Recommendations  Rolling walker with 5" wheels    Recommendations for Other Services       Precautions / Restrictions Precautions Precautions: Fall Required Braces or Orthoses: Other Brace/Splint Other Brace/Splint: post-op shoe R/ L foot prosthesis Restrictions Weight Bearing Restrictions: Yes Other Position/Activity Restrictions: WBAT RLE-heel only      Mobility  Bed Mobility Overal bed mobility: Independent                Transfers Overall transfer level: Needs assistance Equipment used: Rolling walker (2 wheeled) Transfers: Sit to/from Stand Sit to Stand: Min assist         General transfer comment: standing from bed with verbal cues for safe hand palcement on bed vs walker and cueing to  kick R leg out further for heel weight bearing, inital imbalance which pt self-corrected before grabbing RW  Ambulation/Gait Ambulation/Gait assistance: Min assist Ambulation Distance (Feet): 75 Feet (72ft, seated break 8ft) Assistive device: Rolling walker (2 wheeled) Gait Pattern/deviations: Step-through pattern;Decreased step length - right;Decreased stride length;Antalgic Gait velocity: decreased Gait velocity interpretation: Below normal speed for age/gender General Gait Details: performed correct sequencing and weight bearing with RW, increase in dizziness and quick to fatigue due to weakness and pain  Stairs            Wheelchair Mobility    Modified Rankin (Stroke Patients Only)       Balance Overall balance assessment: Needs assistance Sitting-balance support: No upper extremity supported;Feet supported Sitting balance-Leahy Scale: Good     Standing balance support: Bilateral upper extremity supported;During functional activity Standing balance-Leahy Scale: Fair                               Pertinent Vitals/Pain Pain Assessment: 0-10 Pain Score: 3  Pain Location: foot Pain Descriptors / Indicators: Discomfort;Grimacing;Guarding Pain Intervention(s): Limited activity within patient's tolerance;Monitored during session    Home Living Family/patient expects to be discharged to:: Private residence Living Arrangements: Children Available Help at Discharge: Family;Available 24 hours/day Type of Home: House Home Access: Stairs to enter Entrance Stairs-Rails: None Entrance Stairs-Number of Steps: 2 Home Layout: Two level Home Equipment: Cane - single point;Shower seat      Prior Function Level of Independence: Independent with assistive device(s)  Comments: uses cane intermittently, daughter does do the grocery shopping     Hand Dominance   Dominant Hand: Right    Extremity/Trunk Assessment   Upper Extremity Assessment:  Overall WFL for tasks assessed           Lower Extremity Assessment: Generalized weakness;RLE deficits/detail;LLE deficits/detail RLE Deficits / Details: weight bearing through heel due to surgery, limited by pain' LLE Deficits / Details: BKA-prosthetic  Cervical / Trunk Assessment: Normal  Communication   Communication: No difficulties  Cognition Arousal/Alertness: Awake/alert Behavior During Therapy: WFL for tasks assessed/performed Overall Cognitive Status: Within Functional Limits for tasks assessed                      General Comments General comments (skin integrity, edema, etc.): BP 123/65 upon initially sitting up, pt c/o dizziness t/o evaluation    Exercises        Assessment/Plan    PT Assessment Patient needs continued PT services  PT Diagnosis Difficulty walking;Acute pain   PT Problem List Decreased strength;Decreased activity tolerance;Decreased balance;Decreased mobility  PT Treatment Interventions Gait training;Stair training;DME instruction;Balance training;Functional mobility training;Therapeutic activities;Therapeutic exercise   PT Goals (Current goals can be found in the Care Plan section) Acute Rehab PT Goals Patient Stated Goal: go home PT Goal Formulation: With patient Time For Goal Achievement: 08/26/15 Potential to Achieve Goals: Good    Frequency Min 3X/week   Barriers to discharge        Co-evaluation               End of Session Equipment Utilized During Treatment: Gait belt;Other (comment) (R post op shoe) Activity Tolerance: Patient limited by fatigue;Patient limited by pain Patient left: in chair;with call bell/phone within reach Nurse Communication: Mobility status         Time: 4098-1191 PT Time Calculation (min) (ACUTE ONLY): 29 min   Charges:   PT Evaluation $PT Eval Moderate Complexity: 1 Procedure PT Treatments $Gait Training: 8-22 mins   PT G Codes:        Ulyses Jarred 2015-08-20, 4:51 PM   Ulyses Jarred, Student Physical Therapist Acute Rehab [phone]

## 2015-08-12 NOTE — Op Note (Signed)
NAME:  Trevor, Thompson                     ACCOUNT NO.:  MEDICAL RECORD NO.:  [id number]  LOCATION:                                 FACILITY:  PHYSICIAN:  Harvie Junior, M.D.   DATE OF BIRTH:  August 04, 1960  DATE OF PROCEDURE:  08/11/2015 DATE OF DISCHARGE:                              OPERATIVE REPORT   PREOPERATIVE DIAGNOSIS:  Status post transmetatarsal amputation with open packing to allow for continued drainage and additional antibiotic therapy.  POSTOPERATIVE DIAGNOSIS:  Status post transmetatarsal amputation with open packing to allow for continued drainage and additional antibiotic therapy.  PROCEDURE: 1. Excisional debridement of skin, subcutaneous tissue, muscle,     fascia, and bone at the site of the transmetatarsal amputation. 2. Delayed primary wound closure of a transmetatarsal amputation.  SURGEON:  Harvie Junior, M.D.  ASSISTANT:  Marshia Ly, P.A.  ANESTHESIA:  General.  BRIEF HISTORY:  [name] is a 55 year old male with a history of significant complaints of right foot pain and swelling with foul smell. He had an overwhelming infection in the foot, we did a transmetatarsal amputation on Friday and closed the skin, it looked a little bad centrally posteriorly, I was concerned about pus tracking up the tendon sheath into the foot and posterior capsule.  We went ahead and packed it open on Friday, irrigated it thoroughly and brought him back today for evaluation, debridement, and delayed primary closure.  DESCRIPTION OF PROCEDURE:  The patient was taken to the operative room. After adequate anesthesia was obtained with general anesthetic, the patient was placed supine on the operating table.  The right leg was prepped and draped in the usual sterile fashion.  We took out the stitches and pulled the packing prior to prepping.  There was centrally looked a little dusky in the posterior flap and anterior flap looked good.  Medial and laterally on the flap,  it looked good, so we were not certain exactly where we get into, so at that point, we prepped and draped as outlined and following this, we irrigated thoroughly.  I was able to take about almost an inch of skin away posteriorly which gave [place] still an easy ability to kind of get this up and close, and this was resected a lot of the dusky area but did leave some of the dusky area centrally.  He and I talked preoperatively about the possibility that he would have a portion of the wound that broke down and would need dressing changes, he well aware of this issue.  The patient was taken to the operative room.  After adequate anesthesia was obtained with general anesthetic, the patient was placed supine on the operating table.  The right leg was prepped and draped in the usual sterile fashion.  Following this, attention turned to the right foot, where after evaluation, we used a rongeur to shorten some of the metatarsal stumps.  We irrigated and debrided the skin with a knife.  We debrided subcutaneous tissue with scissors and muscle and fascia with scissors.  Once this was done, we copiously irrigated with 3 L of normal saline irrigation and then checked the wound.  We did not use a tourniquet at all.  Medially and laterally, it was bleeding good. Centrally, he still had a little bit of issue with that, but we were putting it over a well-vascularized bone.  It was hopeful that would help some, so at this point, he had the posterior flap closed.  This gave excellent nice watertight closure.  We left small Penrose drain in the foot to allow for any additional drainage that needed to occur.  He was closed in layers over the drain as outlined.  Sterile compressive dressing was applied.  Taken to recovery and noted to be in satisfactory condition.  Estimated loss for the procedure was minimal.     Harvie Junior, M.D.     Ranae Plumber  D:  08/11/2015  T:  08/12/2015  Job:  [id number]

## 2015-08-12 NOTE — Care Management Important Message (Signed)
Important Message  Patient Details  Name: Trevor Thompson MRN: [id number] Date of Birth: [birth year]   Medicare Important Message Given:  Yes    Rayvon Char 08/12/2015, 10:37 AMImportant Message  Patient Details  Name: Trevor Thompson MRN: [id number] Date of Birth: 1960-10-01   Medicare Important Message Given:  Yes    Huxton Glaus G 08/12/2015, 10:37 AM

## 2015-08-12 NOTE — Progress Notes (Signed)
Subjective: 1 Day Post-Op Procedure(s) (LRB): EXCISIONAL DEBRIDEMENT OF SKIN, TISSUE, BONE OF RIGHT METATARSAL AND WOUND CLOSURE OF RIGHT FOOT.  (Right) Patient reports pain as 1 on 0-10 scale.    Objective: Vital signs in last 24 hours: Temp:  [98.2 F (36.8 C)-98.4 F (36.9 C)] 98.2 F (36.8 C) (02/06 2110) Pulse Rate:  [91-93] 92 (02/06 2110) Resp:  [15-16] 16 (02/06 2110) BP: (113-129)/(60-73) 113/60 mmHg (02/06 2110) SpO2:  [93 %-100 %] 93 % (02/06 2110)  Intake/Output from previous day: 02/06 0701 - 02/07 0700 In: 620 [P.O.:120; I.V.:500] Out: 551 [Urine:500; Stool:1; Blood:50] Intake/Output this shift:     Recent Labs  08/10/15 0546 08/11/15 0628 08/12/15 0558  HGB 8.3* 8.5* 8.1*    Recent Labs  08/11/15 0628 08/12/15 0558  WBC 13.8* 14.3*  RBC 3.28* 3.29*  HCT 27.4* 27.5*  PLT 299 317    Recent Labs  08/11/15 0628 08/12/15 0558  NA 137 138  K 3.5 3.5  CL 107 108  CO2 21* 21*  BUN 25* 26*  CREATININE 1.92* 2.01*  GLUCOSE 138* 111*  CALCIUM 8.5* 8.4*   No results for input(s): LABPT, INR in the last 72 hours. Right foot exam: Dressing clean and dry. No redness up the leg. Patient appears comfortable.   Assessment/Plan: 1 Day Post-Op Procedure(s) (LRB): EXCISIONAL DEBRIDEMENT OF SKIN, TISSUE, BONE OF RIGHT METATARSAL AND WOUND CLOSURE OF RIGHT FOOT.  (Right) Plan: Will change right foot dressing in 2 days and remove Penrose drains. Continue IV antibiotics in the meantime. He should be ready for discharge home after dressing change. May weight-bear as tolerated on the right side with a postop shoe. He needs to put most of his weight on his heel. I would like him out of bed daily.   Alwyn Cordner G 08/12/2015, 8:26 AM

## 2015-08-12 NOTE — Progress Notes (Signed)
PROGRESS NOTE  Trevor Thompson FYT:[id number] DOB: Apr 10, 1961 DOA: 08/06/2015 PCP: Minerva Ends, MD   Brief History 55 y/o with a history of renal transplantation, peripheral vascular disease, hypertension, diabetes mellitus type 2, and left BKA presented with worsening diabetic foot infection. The patient recently had amputation of his right second toe in [place] in December 2016. He stated that the wound never healed, and he had been going to the wound care center on a daily basis. In the past 2 weeks, there has been increasing edema and drainage about his right foot. The patient states that he has not been placed on any antibiotics recently. The patient saw his orthopedist, Dr. Berenice Primas, in office approximately one week prior to admission and MRI was done at an outside facility. Assessment/Plan: Sepsis -present at the time of admission -pt had tachycardia HR>110 and WBC 18.1 -secondary to foot infection -pt had fluids at 150cc/hr -blood cultures negative Acute osteomyelitis right foot -positive probe to bone test with purulent drainage noted -appreciate ortho follow up -R-TMA on 08/08/15 -08/11/15--revision of TMA, repeat debridement, closure -08/01/2015 MRI R- foot--abnormal for collection adjacent to the second, third, and fourth distal metatarsals; also with abnormal edema in the extensor tendon sheaths of the fourth and fifth metatarsals; it was felt that the fluid collections likely represented pus; no frank radiographic features of osteomyelitis -ESR--123 -CRP--17.2 -Continue vancomycin, cefepime, metronidazole while in hospital (D#7) per ortho -nonsurgical wound culture--not reliable -add gabapentin for neuropathic pain; change to percocet -would discharge patient patient with doxycycline 153m bid and cipro 500 mg daily x 21 days due to severity of infection and extent of infection CKD stage III with renal transplantation  -Appreciate Nephrology -status post [place] -Continue tacrolimus and mycophenolate  -Continue maintenance prednisone  -Previous Baseline creatinine 2.1-2.3  -Continue furosemide -bicarbonate drip-->oral bicarb Diabetes mellitus type 2  -Hemoglobin A1c--pending -Continue Lantus-->reduce dose to 20 units -CBGs well controlled  -NovoLog sliding scale  Hyperlipidemia  -Continue statin  Hyperkalemia -improved with fluids/kayexalate Severe protein calorie malnutrition -Nutritional supplementation Diarrhea -cancel cdiff--no further BMs  Family Communication: family update at beside 08/09/15 Disposition Plan: Home 2/8 or 2/9 if cleared by ortho     Procedures/Studies: Portable Chest 1 View  08/06/2015  CLINICAL DATA:  55year old male under preoperative evaluation prior to right foot amputation. EXAM: PORTABLE CHEST 1 VIEW COMPARISON:  Chest x-ray 08/22/2014. FINDINGS: Linear scarring in the right mid lung is unchanged. No acute consolidative airspace disease. No pleural effusions. Chronic elevation of the right hemidiaphragm is unchanged. No evidence of pulmonary edema. Heart size is borderline enlarged. Upper mediastinal contours are within normal limits. Atherosclerosis in the thoracic aorta. IMPRESSION: 1. No radiographic evidence of acute cardiopulmonary disease. 2. Atherosclerosis. Electronically Signed   By: DVinnie LangtonM.D.   On: 08/06/2015 17:01   Dg Foot 2 Views Right  08/08/2015  CLINICAL DATA:  Status post right foot amputation EXAM: RIGHT FOOT - 2 VIEW COMPARISON:  08/01/2015 FINDINGS: The patient is status post right forefoot amputation at the level of the base of the metatarsal bones. There are no complications identified. Overlying bandage material is noted. Vascular calcifications are present. IMPRESSION: 1. Status post forefoot amputation without complication. Electronically Signed   By: TKerby MoorsM.D.   On: 08/08/2015 16:19         Subjective: Pt c/o  pain in right foot.  Patient denies fevers, chills,  headache, chest pain, dyspnea, nausea, vomiting, diarrhea, abdominal pain, dysuria, hematuria   Objective: Filed Vitals:   08/11/15 1810 08/11/15 1815 08/11/15 2110 08/12/15 1758  BP:  129/73 113/60 105/63  Pulse: 91 93 92 99  Temp: 98.4 F (36.9 C)  98.2 F (36.8 C) 98 F (36.7 C)  TempSrc:   Oral Oral  Resp: 16 15 16 19   Height:      Weight:      SpO2:  100% 93% 99%    Intake/Output Summary (Last 24 hours) at 08/12/15 1803 Last data filed at 08/12/15 1500  Gross per 24 hour  Intake    240 ml  Output    852 ml  Net   -612 ml   Weight change:  Exam:   General:  Pt is alert, follows commands appropriately, not in acute distress  HEENT: No icterus, No thrush, No neck mass, Vivian/AT  Cardiovascular: RRR, S1/S2, no rubs, no gallops  Respiratory: CTA bilaterally, no wheezing, no crackles, no rhonchi  Abdomen: Soft/+BS, non tender, non distended, no guarding  Extremities: No edema, No lymphangitis, No petechiae, No rashes, no synovitis  Data Reviewed: Basic Metabolic Panel:  Recent Labs Lab 08/08/15 0540 08/09/15 0545 08/10/15 0546 08/11/15 0628 08/12/15 0558  NA 140 137 140 137 138  K 5.0 4.2 3.6 3.5 3.5  CL 111 108 108 107 108  CO2 17* 18* 21* 21* 21*  GLUCOSE 73 88 124* 138* 111*  BUN 33* 27* 27* 25* 26*  CREATININE 2.21* 2.06* 1.94* 1.92* 2.01*  CALCIUM 8.7* 8.6* 8.6* 8.5* 8.4*  PHOS  --  3.8 3.1 2.8  --    Liver Function Tests:  Recent Labs Lab 08/06/15 1327 08/07/15 1349 08/09/15 0545 08/10/15 0546 08/11/15 0628  AST 11* 13*  --   --   --   ALT 10* 8*  --   --   --   ALKPHOS 59 75  --   --   --   BILITOT 0.6 0.7  --   --   --   PROT 6.7 6.3*  --   --   --   ALBUMIN 2.4* 2.0* 1.7* 1.6* 1.6*   No results for input(s): LIPASE, AMYLASE in the last 168 hours. No results for input(s): AMMONIA in the last 168 hours. CBC:  Recent Labs Lab 08/06/15 1208 08/07/15 1349 08/08/15 0919  08/09/15 0545 08/10/15 0546 08/11/15 0628 08/12/15 0558  WBC 18.1* 19.7* 22.6* 21.4* 17.1* 13.8* 14.3*  NEUTROABS 14.1* 16.7*  --   --   --   --   --   HGB 9.4* 9.6* 8.2* 8.4* 8.3* 8.5* 8.1*  HCT 30.2* 31.0* 26.4* 27.6* 26.5* 27.4* 27.5*  MCV 84.8 85.6 84.6 84.4 83.9 83.5 83.6  PLT 317 328 296 313 292 299 317   Cardiac Enzymes: No results for input(s): CKTOTAL, CKMB, CKMBINDEX, TROPONINI in the last 168 hours. BNP: Invalid input(s): POCBNP CBG:  Recent Labs Lab 08/11/15 1813 08/11/15 2147 08/12/15 0810 08/12/15 1159 08/12/15 1721  GLUCAP 119* 204* 96 155* 185*    Recent Results (from the past 240 hour(s))  Culture, blood (single)     Status: None   Collection Time: 08/06/15 12:08 PM  Result Value Ref Range Status   Specimen Description BLOOD LEFT ARM  Final   Special Requests BOTTLES DRAWN AEROBIC AND ANAEROBIC 3CC  Final   Culture   Final    NO GROWTH 5 DAYS Performed at [facility]    Report Status 08/11/2015  FINAL  Final  Wound culture     Status: None   Collection Time: 08/06/15 12:21 PM  Result Value Ref Range Status   Specimen Description WOUND RIGHT FOOT TOE  Final   Special Requests Normal  Final   Gram Stain   Final    NO WBC SEEN NO SQUAMOUS EPITHELIAL CELLS SEEN ABUNDANT GRAM POSITIVE COCCI IN PAIRS ABUNDANT GRAM NEGATIVE COCCOBACILLI Performed at [facility]    Culture   Final    FEW DIPHTHEROIDS(CORYNEBACTERIUM SPECIES) Note: Standardized susceptibility testing for this organism is not available. Performed at [facility]    Report Status 08/08/2015 FINAL  Final  Surgical pcr screen     Status: None   Collection Time: 08/08/15 12:31 PM  Result Value Ref Range Status   MRSA, PCR NEGATIVE NEGATIVE Final   Staphylococcus aureus NEGATIVE NEGATIVE Final    Comment:        The Xpert SA Assay (FDA approved for NASAL specimens in patients over 34 years of age), is one component of a comprehensive surveillance program.   Test performance has been validated by [facility] for patients greater than or equal to 44 year old. It is not intended to diagnose infection nor to guide or monitor treatment.      Scheduled Meds: . atorvastatin  20 mg Oral q1800  . calcitRIOL  0.5 mcg Oral Daily  . ceFEPime (MAXIPIME) IV  2 g Intravenous Q24H  . darbepoetin (ARANESP) injection - NON-DIALYSIS  100 mcg Subcutaneous Q Sat-1800  . feeding supplement (PRO-STAT SUGAR FREE 64)  30 mL Oral TID  . furosemide  40 mg Oral Daily  . gabapentin  300 mg Oral QHS  . heparin  5,000 Units Subcutaneous 3 times per day  . insulin aspart  0-5 Units Subcutaneous QHS  . insulin aspart  0-9 Units Subcutaneous TID WC  . insulin glargine  20 Units Subcutaneous Daily  . metronidazole  500 mg Intravenous Q8H  . mycophenolate  720 mg Oral BID  . predniSONE  5 mg Oral Q breakfast  . sodium bicarbonate  650 mg Oral BID  . tacrolimus  4 mg Oral BID   Continuous Infusions: . sodium chloride 0 mL/hr at 08/08/15 1505     Carrah Eppolito, DO  [facility] Pager [phone]  If 7PM-7AM, please contact night-coverage [email or web address] Password [email or web address] 08/12/2015, 6:03 PM   LOS: 6 days

## 2015-08-13 DIAGNOSIS — N183 Chronic kidney disease, stage 3 (moderate): Secondary | ICD-10-CM

## 2015-08-13 DIAGNOSIS — M86171 Other acute osteomyelitis, right ankle and foot: Secondary | ICD-10-CM

## 2015-08-13 LAB — GLUCOSE, CAPILLARY
GLUCOSE-CAPILLARY: 161 mg/dL — AB (ref 65–99)
GLUCOSE-CAPILLARY: 201 mg/dL — AB (ref 65–99)
GLUCOSE-CAPILLARY: 168 mg/dL — AB (ref 65–99)
Glucose-Capillary: 110 mg/dL — ABNORMAL HIGH (ref 65–99)
Glucose-Capillary: 156 mg/dL — ABNORMAL HIGH (ref 65–99)

## 2015-08-13 LAB — BASIC METABOLIC PANEL
Anion gap: 9 (ref 5–15)
BUN: 24 mg/dL — AB (ref 6–20)
CHLORIDE: 108 mmol/L (ref 101–111)
CO2: 22 mmol/L (ref 22–32)
Calcium: 8.5 mg/dL — ABNORMAL LOW (ref 8.9–10.3)
Creatinine, Ser: 2.11 mg/dL — ABNORMAL HIGH (ref 0.61–1.24)
GFR calc Af Amer: 39 mL/min — ABNORMAL LOW (ref >60)
GFR, EST NON AFRICAN AMERICAN: 34 mL/min — AB (ref >60)
Glucose, Bld: 98 mg/dL (ref 65–99)
POTASSIUM: 3.4 mmol/L — AB (ref 3.5–5.1)
SODIUM: 139 mmol/L (ref 135–145)

## 2015-08-13 LAB — CBC
HEMATOCRIT: 27.5 % — AB (ref 39.0–52.0)
Hemoglobin: 8.4 g/dL — ABNORMAL LOW (ref 13.0–17.0)
MCH: 25.9 pg — ABNORMAL LOW (ref 26.0–34.0)
MCHC: 30.5 g/dL (ref 30.0–36.0)
MCV: 84.9 fL (ref 78.0–100.0)
Platelets: 316 10*3/uL (ref 150–400)
RBC: 3.24 MIL/uL — AB (ref 4.22–5.81)
RDW: 15.2 % (ref 11.5–15.5)
WBC: 16.5 10*3/uL — AB (ref 4.0–10.5)

## 2015-08-13 LAB — VANCOMYCIN, RANDOM: Vancomycin Rm: 27 ug/mL

## 2015-08-13 MED ORDER — VANCOMYCIN HCL IN DEXTROSE 1-5 GM/200ML-% IV SOLN
1000.0000 mg | INTRAVENOUS | Status: DC
  Administered 2015-08-14: 1000 mg via INTRAVENOUS
  Filled 2015-08-13: qty 200

## 2015-08-13 MED ORDER — POTASSIUM CHLORIDE CRYS ER 20 MEQ PO TBCR
40.0000 meq | EXTENDED_RELEASE_TABLET | Freq: Once | ORAL | Status: AC
  Administered 2015-08-13: 40 meq via ORAL
  Filled 2015-08-13: qty 2

## 2015-08-13 NOTE — Progress Notes (Signed)
PROGRESS NOTE    Trevor Thompson MMH:[id number] DOB: 01/02/1961 DOA: 08/06/2015 PCP: Minerva Ends, MD  HPI/Brief narrative 55 y/o with a history of renal transplantation, peripheral vascular disease, hypertension, diabetes mellitus type 2, and left BKA presented with worsening diabetic foot infection. The patient recently had amputation of his right second toe in [place] in December 2016. He stated that the wound never healed, and he had been going to the wound care center on a daily basis. In the past 2 weeks, there has been increasing edema and drainage about his right foot. The patient saw his orthopedist, Dr. Berenice Primas, in office approximately one week prior to admission and MRI was done at an outside facility. Presented with sepsis secondary to acute osteomyelitis of right foot. Now status post TMA amputation on 08/08/15.   Assessment/Plan:   1. Sepsis: Present on admission. Secondary to acute osteomyelitis of right foot. Sepsis physiology resolved. 2. Acute right foot osteomyelitis: Status post right TMA on 08/08/15 and revision of TMA with repeat debridement and closure on 08/11/15. Orthopedic input appreciated. ESR 123, CRP 17.2. Can follow these as outpatient. Treating with vancomycin, cefepime, metronidazole while in hospital, per orthopedics (day #8). MRSA PCR negative. Nonsurgical wound cultures-not reliable. Blood culture 1: Negative. Per chart review, recommendations for discharge on doxycycline 100 MG twice a day and Cipro 500 MG daily 21 days due to severity of infection and extent of infection. We will confirm this with orthopedics prior to DC. 3. CKD stage III with renal transplantation: Status post renal transplant 13 years ago in [place]. Continue tacrolimus, mycophenolate and maintenance prednisone. Previous baseline creatinine 2.1-2.3. Continue furosemide and oral bicarbonate. Stable. 4. DM 2 with renal complications: P5X pending. Mildly fluctuating CBGs. Continue Lantus and  SSI. 5. Hyperlipidemia: Statins 6. Hyperkalemia: Resolved. 7. Severe protein calorie malnutrition: Nutritional supplementation 8. Diarrhea: Resolved. 9. Hypokalemia: Replace and follow. 10. Anemia: Follow CBCs.  DVT prophylaxis: Heparin Code Status: Full Family Communication: None at bedside Disposition Plan: DC home possibly 2/9 pending orthopedic clearance   Consultants:  Orthopedics  Procedures:  Right transmetatarsal amputation 08/08/15  Revision of right transmetatarsal amputation, repeat debridement and closure on 08/11/15  Antimicrobials:  IV aztreonam 1 dose on 2/1  IV cefepime 2/1 >  IV clindamycin 1 dose on 2/3  IV vancomycin 2/1 >  Metronidazole 2/1 >   Subjective: Denied complaints.  Objective: Filed Vitals:   08/12/15 2021 08/13/15 0420 08/13/15 0839 08/13/15 1648  BP: 138/80 141/77 129/76 108/63  Pulse: 81 102 84 92  Temp: 98.4 F (36.9 C) 98.4 F (36.9 C) 98.6 F (37 C) 98.1 F (36.7 C)  TempSrc: Oral  Oral Oral  Resp: 18 20 20 20   Height:      Weight: 103.4 kg (227 lb 15.3 oz)     SpO2: 93% 93% 95% 96%    Intake/Output Summary (Last 24 hours) at 08/13/15 1704 Last data filed at 08/13/15 1649  Gross per 24 hour  Intake    480 ml  Output   1750 ml  Net  -1270 ml   Filed Weights   08/10/15 0700 08/10/15 2200 08/12/15 2021  Weight: 103.5 kg (228 lb 2.8 oz) 103.6 kg (228 lb 6.3 oz) 103.4 kg (227 lb 15.3 oz)    Exam:  General exam: Pleasant middle-aged male lying comfortably in bed. Respiratory system: Clear. No increased work of breathing. Cardiovascular system: S1 & S2 heard, RRR. No JVD, murmurs, gallops, clicks or pedal edema. Gastrointestinal system: Abdomen is  nondistended, soft and nontender. Normal bowel sounds heard. Central nervous system: Alert and oriented. No focal neurological deficits. Extremities: Symmetric 5 x 5 power. Healed left BKA stump. Right foot surgical dressing clean and dry.   Data Reviewed: Basic  Metabolic Panel:  Recent Labs Lab 08/09/15 0545 08/10/15 0546 08/11/15 0628 08/12/15 0558 08/13/15 0743  NA 137 140 137 138 139  K 4.2 3.6 3.5 3.5 3.4*  CL 108 108 107 108 108  CO2 18* 21* 21* 21* 22  GLUCOSE 88 124* 138* 111* 98  BUN 27* 27* 25* 26* 24*  CREATININE 2.06* 1.94* 1.92* 2.01* 2.11*  CALCIUM 8.6* 8.6* 8.5* 8.4* 8.5*  PHOS 3.8 3.1 2.8  --   --    Liver Function Tests:  Recent Labs Lab 08/07/15 1349 08/09/15 0545 08/10/15 0546 08/11/15 0628  AST 13*  --   --   --   ALT 8*  --   --   --   ALKPHOS 75  --   --   --   BILITOT 0.7  --   --   --   PROT 6.3*  --   --   --   ALBUMIN 2.0* 1.7* 1.6* 1.6*   No results for input(s): LIPASE, AMYLASE in the last 168 hours. No results for input(s): AMMONIA in the last 168 hours. CBC:  Recent Labs Lab 08/07/15 1349  08/09/15 0545 08/10/15 0546 08/11/15 0628 08/12/15 0558 08/13/15 0743  WBC 19.7*  < > 21.4* 17.1* 13.8* 14.3* 16.5*  NEUTROABS 16.7*  --   --   --   --   --   --   HGB 9.6*  < > 8.4* 8.3* 8.5* 8.1* 8.4*  HCT 31.0*  < > 27.6* 26.5* 27.4* 27.5* 27.5*  MCV 85.6  < > 84.4 83.9 83.5 83.6 84.9  PLT 328  < > 313 292 299 317 316  < > = values in this interval not displayed. Cardiac Enzymes: No results for input(s): CKTOTAL, CKMB, CKMBINDEX, TROPONINI in the last 168 hours. BNP (last 3 results) No results for input(s): PROBNP in the last 8760 hours. CBG:  Recent Labs Lab 08/12/15 1721 08/12/15 2020 08/13/15 0728 08/13/15 1212 08/13/15 1623  GLUCAP 185* 152* 110* 161* 201*    Recent Results (from the past 240 hour(s))  Culture, blood (single)     Status: None   Collection Time: 08/06/15 12:08 PM  Result Value Ref Range Status   Specimen Description BLOOD LEFT ARM  Final   Special Requests BOTTLES DRAWN AEROBIC AND ANAEROBIC 3CC  Final   Culture   Final    NO GROWTH 5 DAYS Performed at [facility]    Report Status 08/11/2015 FINAL  Final  Wound culture     Status: None   Collection  Time: 08/06/15 12:21 PM  Result Value Ref Range Status   Specimen Description WOUND RIGHT FOOT TOE  Final   Special Requests Normal  Final   Gram Stain   Final    NO WBC SEEN NO SQUAMOUS EPITHELIAL CELLS SEEN ABUNDANT GRAM POSITIVE COCCI IN PAIRS ABUNDANT GRAM NEGATIVE COCCOBACILLI Performed at [facility]    Culture   Final    FEW DIPHTHEROIDS(CORYNEBACTERIUM SPECIES) Note: Standardized susceptibility testing for this organism is not available. Performed at [facility]    Report Status 08/08/2015 FINAL  Final  Surgical pcr screen     Status: None   Collection Time: 08/08/15 12:31 PM  Result Value Ref Range Status  MRSA, PCR NEGATIVE NEGATIVE Final   Staphylococcus aureus NEGATIVE NEGATIVE Final    Comment:        The Xpert SA Assay (FDA approved for NASAL specimens in patients over 16 years of age), is one component of a comprehensive surveillance program.  Test performance has been validated by [facility] for patients greater than or equal to 81 year old. It is not intended to diagnose infection nor to guide or monitor treatment.          Studies: No results found.      Scheduled Meds: . atorvastatin  20 mg Oral q1800  . calcitRIOL  0.5 mcg Oral Daily  . ceFEPime (MAXIPIME) IV  2 g Intravenous Q24H  . darbepoetin (ARANESP) injection - NON-DIALYSIS  100 mcg Subcutaneous Q Sat-1800  . feeding supplement (PRO-STAT SUGAR FREE 64)  30 mL Oral TID  . furosemide  40 mg Oral Daily  . gabapentin  300 mg Oral QHS  . heparin  5,000 Units Subcutaneous 3 times per day  . insulin aspart  0-5 Units Subcutaneous QHS  . insulin aspart  0-9 Units Subcutaneous TID WC  . insulin glargine  20 Units Subcutaneous Daily  . metronidazole  500 mg Intravenous Q8H  . mycophenolate  720 mg Oral BID  . predniSONE  5 mg Oral Q breakfast  . sodium bicarbonate  650 mg Oral BID  . tacrolimus  4 mg Oral BID  . [START ON 08/14/2015] vancomycin  1,000 mg Intravenous  Q48H   Continuous Infusions: . 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    Vernell Leep, MD, FACP, FHM. [facility] Pager [phone]  If 7PM-7AM, please contact night-coverage [email or web address] Password [email or web address] 08/13/2015, 5:04 PM    LOS: 7 days

## 2015-08-13 NOTE — Care Management Note (Signed)
Case Management Note  Patient Details  Name: Trevor Thompson MRN: [id number] Date of Birth: June 04, 1961  Subjective/Objective:     CM following for progression and d/c planning.               Action/Plan: 08/13/2015 Noted that plan is for d/c to home with [facility] services and DME per pt and per recommendation of PT.   Expected Discharge Date:       08/13/2015           Expected Discharge Plan:  Home w Home Health Services  In-House Referral:  NA  Discharge planning Services  CM Consult  Post Acute Care Choice:  Durable Medical Equipment Choice offered to:  Patient  DME Arranged:  Walker rolling DME Agency:     HH Arranged:  RN, PT HH Agency:     Status of Service:  In process, will continue to follow  Medicare Important Message Given:  Yes Date Medicare IM Given:    Medicare IM give by:    Date Additional Medicare IM Given:    Additional Medicare Important Message give by:     If discussed at Long Length of Stay Meetings, dates discussed:    Additional Comments:  Starlyn Skeans, RN 08/13/2015, 10:57 AM

## 2015-08-13 NOTE — Progress Notes (Signed)
Physical Therapy Treatment Patient Details Name: Trevor Thompson MRN: [id number] DOB: 05/11/1961 Today's Date: 08/13/2015    History of Present Illness 55 y/o with a history of renal transplantation, peripheral vascular disease, hypertension, diabetes mellitus type 2, and left BKA presented with worsening diabetic foot infection. The patient recently had amputation of his right second toe in [place] in December 2016. He stated that the wound never healed, and he had been going to the wound care center on a daily basis.  Underwent R foot transmet amputation and closed 2/6    PT Comments    Continues to make good progress towards functional goals. Safely navigates 2 steps with min assist which he reports his family will provide to enter home. States he does not have to ascend full flight, as he can stay on bottom floor for a while. Fatigues quickly. Reviewed some exercises with pt, which he states he recalls from a previous admission and plans to perform these several times/day. Patient will continue to benefit from skilled physical therapy services to further improve independence with functional mobility.   Follow Up Recommendations  Home health PT;Supervision/Assistance - 24 hour     Equipment Recommendations  Rolling walker with 5" wheels    Recommendations for Other Services       Precautions / Restrictions Precautions Precautions: Fall Required Braces or Orthoses: Other Brace/Splint Other Brace/Splint: post-op shoe R/ L foot prosthesis Restrictions Weight Bearing Restrictions: Yes Other Position/Activity Restrictions: WBAT RLE-heel only    Mobility  Bed Mobility Overal bed mobility: Independent                Transfers Overall transfer level: Needs assistance Equipment used: Rolling walker (2 wheeled) Transfers: Sit to/from Stand Sit to Stand: Supervision         General transfer comment: supervision for safety. VC for hand placement. Slow to rise but stable once  upright with RW for support.  Ambulation/Gait Ambulation/Gait assistance: Supervision Ambulation Distance (Feet): 50 Feet Assistive device: Rolling walker (2 wheeled) Gait Pattern/deviations: Step-to pattern;Decreased step length - right;Decreased stance time - left;Wide base of support Gait velocity: decreased Gait velocity interpretation: Below normal speed for age/gender General Gait Details: VC for weight-bearing through Heel only. No overt loss of balance noted however pt fatigues quickly. VC for upright posture and walker placement.   Stairs Stairs: Yes Stairs assistance: Min assist Stair Management: One rail Left;Step to pattern;Forwards Number of Stairs: 2 General stair comments: Min assist for RUE support while stepping up/down steps. States family will support in this manner at home and he feels confident with this technique. Educated on backwards approach with RW but pt unwilling to attempt.  Wheelchair Mobility    Modified Rankin (Stroke Patients Only)       Balance                                    Cognition Arousal/Alertness: Awake/alert Behavior During Therapy: WFL for tasks assessed/performed Overall Cognitive Status: Within Functional Limits for tasks assessed                      Exercises General Exercises - Lower Extremity Ankle Circles/Pumps: AROM;Right;10 reps;Seated    General Comments General comments (skin integrity, edema, etc.): RLE edema, RN notified. RLE elevated      Pertinent Vitals/Pain Pain Assessment: Faces Pain Score: 2  Pain Location: Rt foot Pain Descriptors / Indicators: Aching Pain  Intervention(s): Monitored during session;Repositioned    Home Living                      Prior Function            PT Goals (current goals can now be found in the care plan section) Acute Rehab PT Goals Patient Stated Goal: go home PT Goal Formulation: With patient Time For Goal Achievement:  08/26/15 Potential to Achieve Goals: Good Progress towards PT goals: Progressing toward goals    Frequency  Min 3X/week    PT Plan Current plan remains appropriate    Co-evaluation             End of Session Equipment Utilized During Treatment: Gait belt;Other (comment) (R post op shoe) Activity Tolerance: Patient limited by fatigue Patient left: in chair;with call bell/phone within reach     Time: 1610-9604 PT Time Calculation (min) (ACUTE ONLY): 23 min  Charges:  $Gait Training: 8-22 mins $Therapeutic Activity: 8-22 mins                    G Codes:      Berton Mount 2015/09/06, 1:39 PM [facility] [place], [place] [phone]

## 2015-08-13 NOTE — Progress Notes (Signed)
Pharmacy Antibiotic Note  Trevor Thompson is a 55 y.o. male admitted on 08/06/2015 with progressive deterioration of right foot wound with odor noted. PMH includes DM, L BKA, and s/p renal transplant. Given clinical history and transplant status, MD wanting to cover MRSA and pseudomonas. Continuing on vancomycin, cefepime, and flagyl per pharmacy D#7. S/p transmetatarsal amputation 2/3, OR 2/6 for revision/debridement - attempting to avoid BKA..   A Vancomycin trough on 2/6 was SUPRAtherapeutic (40 mcg/ml, goal of 15-20 mcg/ml) - the RN hung approximately half of the Vancomycin dose prior to the level resulted and Vancomycin being discontinued. A Vancomycin random this morning is still SUPRAtherapeutic but trending down (VR 27 mcg/ml, goal of 15-20 mcg/ml). Kinetics for this patient are difficult since the patient received a half dose of Vancomycin after the level resulted on 2/6. Based on calculations - the patient can start q48h dosing on 2/9 AM. MD is considering transition to oral antibiotics soon.   Plan: 1. Resume Vancomycin 1g IV every 48 hours starting on 2/9 AM 2. Continue Cefepime to 2g IV q24h for renal function 3. Continue Flagyl 500 mg IV q8h 4. Will continue to follow renal function, culture results, LOT, and antibiotic de-escalation plans   Height:  (180.3 cm) Weight: 227 lb 15.3 oz (103.4 kg) IBW/kg (Calculated) : 75.3  Temp (24hrs), Avg:98.4 F (36.9 C), Min:98 F (36.7 C), Max:98.6 F (37 C)   Recent Labs Lab 08/09/15 0545 08/10/15 0546 08/11/15 0628 08/11/15 1256 08/12/15 0558 08/13/15 0743  WBC 21.4* 17.1* 13.8*  --  14.3* 16.5*  CREATININE 2.06* 1.94* 1.92*  --  2.01* 2.11*  VANCOTROUGH  --   --   --  40*  --   --   VANCORANDOM  --   --   --   --   --  27    Estimated Creatinine Clearance: 48.4 mL/min (by C-G formula based on Cr of 2.11).    Allergies  Allergen Reactions  . Penicillins Other (See Comments)    Has patient had a PCN reaction causing  immediate rash, facial/tongue/throat swelling, SOB or lightheadedness with hypotension: No Has patient had a PCN reaction causing severe rash involving mucus membranes or skin necrosis: No Has patient had a PCN reaction that required hospitalization. No Has patient had a PCN reaction occurring within the last 10 years: No If all of the above answers are "NO", then may proceed with Cephalosporin use.     Antimicrobials this admission: Azactam 2/1 x 1 dose Vancomycin 2/1 >> Cefepime 2/1 >>  Metronidazole 2/1 >>   Dose adjustments this admission: * 2/6 VT 40 (drawn 1 hr late) on 1.25g/24h - hold, RN had already infused for 45 min before stopped  * 2/8 VR 27 mcg/ml - cannot do pt specific kinetics since partial dose infused after VT on 2/6, can make an estimation that ke~0.0143 - would recommend starting 1g/48h dosing on 2/9 AM  Microbiology results: 2/1 BCx1: ngtd  2/1 wound Cx: diphtheroids  MRSA/Staph PCR neg  Thank you for allowing pharmacy to be a part of this patient's care.  Georgina Pillion, PharmD, BCPS Clinical Pharmacist Pager: [phone] 08/13/2015 11:09 AM

## 2015-08-14 DIAGNOSIS — A419 Sepsis, unspecified organism: Secondary | ICD-10-CM

## 2015-08-14 LAB — GLUCOSE, CAPILLARY
GLUCOSE-CAPILLARY: 132 mg/dL — AB (ref 65–99)
GLUCOSE-CAPILLARY: 154 mg/dL — AB (ref 65–99)
GLUCOSE-CAPILLARY: 227 mg/dL — AB (ref 65–99)

## 2015-08-14 LAB — BASIC METABOLIC PANEL
ANION GAP: 11 (ref 5–15)
BUN: 24 mg/dL — ABNORMAL HIGH (ref 6–20)
CALCIUM: 8.8 mg/dL — AB (ref 8.9–10.3)
CO2: 22 mmol/L (ref 22–32)
Chloride: 106 mmol/L (ref 101–111)
Creatinine, Ser: 2.14 mg/dL — ABNORMAL HIGH (ref 0.61–1.24)
GFR, EST AFRICAN AMERICAN: 38 mL/min — AB (ref >60)
GFR, EST NON AFRICAN AMERICAN: 33 mL/min — AB (ref >60)
Glucose, Bld: 153 mg/dL — ABNORMAL HIGH (ref 65–99)
POTASSIUM: 3.5 mmol/L (ref 3.5–5.1)
SODIUM: 139 mmol/L (ref 135–145)

## 2015-08-14 LAB — CBC
HEMATOCRIT: 28.7 % — AB (ref 39.0–52.0)
HEMOGLOBIN: 9 g/dL — AB (ref 13.0–17.0)
MCH: 26.5 pg (ref 26.0–34.0)
MCHC: 31.4 g/dL (ref 30.0–36.0)
MCV: 84.7 fL (ref 78.0–100.0)
Platelets: 324 10*3/uL (ref 150–400)
RBC: 3.39 MIL/uL — AB (ref 4.22–5.81)
RDW: 15.6 % — ABNORMAL HIGH (ref 11.5–15.5)
WBC: 16.9 10*3/uL — AB (ref 4.0–10.5)

## 2015-08-14 LAB — HEMOGLOBIN A1C
HEMOGLOBIN A1C: 6.7 % — AB (ref 4.8–5.6)
MEAN PLASMA GLUCOSE: 146 mg/dL

## 2015-08-14 MED ORDER — ATORVASTATIN CALCIUM 20 MG PO TABS: 20.0000 mg | ORAL_TABLET | Freq: Every day | ORAL | Status: DC

## 2015-08-14 MED ORDER — GABAPENTIN 600 MG PO TABS: 300.0000 mg | ORAL_TABLET | Freq: Every day | ORAL | Status: DC

## 2015-08-14 MED ORDER — OXYCODONE-ACETAMINOPHEN 5-325 MG PO TABS: 1.0000 | ORAL_TABLET | Freq: Three times a day (TID) | ORAL | Status: DC | PRN

## 2015-08-14 MED ORDER — SODIUM BICARBONATE 650 MG PO TABS: 650.0000 mg | ORAL_TABLET | Freq: Two times a day (BID) | ORAL | Status: DC

## 2015-08-14 MED ORDER — SODIUM BICARBONATE 650 MG PO TABS: 650.0000 mg | ORAL_TABLET | Freq: Two times a day (BID) | ORAL | Status: AC

## 2015-08-14 MED ORDER — ATORVASTATIN CALCIUM 20 MG PO TABS: 20.0000 mg | ORAL_TABLET | Freq: Every day | ORAL | Status: AC

## 2015-08-14 MED ORDER — CIPROFLOXACIN HCL 500 MG PO TABS: 500.0000 mg | ORAL_TABLET | Freq: Two times a day (BID) | ORAL | Status: DC

## 2015-08-14 MED ORDER — DOXYCYCLINE HYCLATE 100 MG PO TABS: 100.0000 mg | ORAL_TABLET | Freq: Two times a day (BID) | ORAL | Status: DC

## 2015-08-14 MED ORDER — INSULIN GLARGINE 100 UNIT/ML SOLOSTAR PEN: 20.0000 [IU] | PEN_INJECTOR | Freq: Every day | SUBCUTANEOUS | Status: DC

## 2015-08-14 MED ORDER — GABAPENTIN 600 MG PO TABS: 300.0000 mg | ORAL_TABLET | Freq: Every day | ORAL | Status: AC

## 2015-08-14 NOTE — Discharge Summary (Signed)
Physician Discharge Summary  Trevor Thompson KXF:[id number] DOB: 1960-11-19 DOA: 08/06/2015  PCP: Minerva Ends, MD  Admit date: 08/06/2015 Discharge date: 08/14/2015  Time spent: Greater than 30 minutes  Recommendations for Outpatient Follow-up:  1. Dr. Dorna Leitz, Orthopedics in 10 days for postop follow-up. 2. Dr. Wynonia Musty, PCP in one week with repeat labs (CBC, renal panel & EKG to follow QTC) 3. Dr. Erling Cruz, Nephrology in 2 weeks.  4. Home health PT and RN for dressing change.   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   Filed Weights   08/10/15 0700 08/10/15 2200 08/12/15 2021  Weight: 103.5 kg (228 lb 2.8 oz) 103.6 kg (228 lb 6.3 oz) 103.4 kg (227 lb 15.3 oz)    History of present illness:  55 y/o with a history of renal transplantation, peripheral vascular disease, hypertension, diabetes mellitus type 2, and left BKA presented with worsening diabetic foot infection. The patient recently had amputation of his right second toe in [place] in December 2016. He stated that the wound never healed, and he had been going to the wound care center on a daily basis. In the past 2 weeks, there has been increasing edema and drainage about his right foot. The patient saw his orthopedist, Dr. Berenice Primas, in office approximately one week prior to admission  and MRI was done at an outside facility. Presented with sepsis secondary to acute osteomyelitis of right foot. Now status post TMA amputation on 08/08/15.  Hospital Course:    1. Sepsis: Present on admission. Secondary to acute osteomyelitis of right foot. Sepsis physiology resolved. 2. Acute right foot osteomyelitis: Status post right transmetatarsal amputation on 08/08/15 and revision of TMA with repeat debridement and closure on 08/11/15. Orthopedic input appreciated. ESR 123, CRP 17.2. Can follow these as outpatient. Treated with vancomycin, cefepime, metronidazole while in hospital, per orthopedics (day #9). MRSA PCR negative. Nonsurgical wound cultures-not reliable. Blood culture 1: Negative. Discussed with Dr. Dorna Leitz, orthopedics who recommends additional 2 weeks of oral antibiotics, home health RN for dressing changes (dry dressing followed by Ace wrap) every 4 days, weightbearing as tolerated (they have educated patient regarding this) and outpatient follow-up with him in 10 days. Discussed with infectious disease M.D. on call who recommended oral doxycycline and Cipro 14 days-dose adjusted per discussion with pharmacy. Monitor QTC while on Cipro and tacrolimus. QTC on 08/07/15:430 ms. 3. Acute on CKD stage III in renal transplant patient: Status post renal transplant 13 years ago in [place]. Continue tacrolimus, mycophenolate and maintenance prednisone. Previous baseline creatinine 2.1-2.3. Continue furosemide and oral bicarbonate. Stable. Nephrology was consulted early on in admission. Outpatient follow-up with Dr. Florene Glen. 4. DM 2 with renal complications: I9C 6.7. Mildly fluctuating CBGs. Continue reduced dose of Lantus at discharge which can be adjusted at outpatient follow-up. 5. Hyperlipidemia: Statins 6. Hyperkalemia: Resolved. 7. Severe protein calorie malnutrition: Nutritional supplementation 8. Diarrhea:  Resolved. 9. Hypokalemia: better. 10. Anemia: stable      Consultants:  Orthopedics  Procedures:  Transmetatarsal amputation with open packing 08/08/15  On 08/11/15:  1. Excisional debridement of skin, subcutaneous tissue, muscle, fascia, and bone at the site of the transmetatarsal  amputation.  2. Delayed primary wound closure of a transmetatarsal amputation. 08/11/15  Antimicrobials:  IV aztreonam 1 dose on 2/1  IV cefepime 2/1 >  IV clindamycin 1 dose on 2/3  IV vancomycin 2/1 >  Metronidazole 2/1 >    Discharge Exam:  Complaints: Seen this morning. Denied complaints. Surgical drain had been removed and dressing changed by orthopedics.   Filed Vitals:   08/13/15 1648 08/13/15 2037 08/14/15 0435 08/14/15 0933  BP: 108/63 130/70 133/76 142/76  Pulse: 92 95 92 93  Temp: 98.1 F (36.7 C) 98 F (36.7 C) 98.3 F (36.8 C) 98.3 F (36.8 C)  TempSrc: Oral   Oral  Resp: _0 Height:      Weight:      SpO2: 96% 98% 98% 96%    General exam: Pleasant middle-aged male sitting comfortably at edge of bed. Respiratory system: Clear. No increased work of breathing. Cardiovascular system: S1 & S2 heard, RRR. No JVD, murmurs, gallops, clicks or pedal edema. Gastrointestinal system: Abdomen is nondistended, soft and nontender. Normal bowel sounds heard. Central nervous system: Alert and oriented. No focal neurological deficits. Extremities: Symmetric 5 x 5 power. Healed left BKA stump. Right foot surgical dressing clean and dry.  Discharge Instructions      Discharge Instructions    (HEART FAILURE PATIENTS) Call MD:  Anytime you have any of the following symptoms: 1) 3 pound weight gain in 24 hours or 5 pounds in 1 week 2) shortness of breath, with or without a dry hacking cough 3) swelling in the hands, feet or stomach 4) if you have to sleep on extra pillows at night in order to breathe.    Complete by:  As directed      Call MD for:  difficulty breathing, headache or visual disturbances    Complete by:  As directed       Call MD for:  extreme fatigue    Complete by:  As directed      Call MD for:  persistant dizziness or light-headedness    Complete by:  As directed      Call MD for:  persistant nausea and vomiting    Complete by:  As directed      Call MD for:  redness, tenderness, or signs of infection (pain, swelling, redness, odor or green/yellow discharge around incision site)    Complete by:  As directed      Call MD for:  severe uncontrolled pain    Complete by:  As directed      Call MD for:  temperature >100.4    Complete by:  As directed      Diet - low sodium heart healthy    Complete by:  As directed      Diet Carb Modified    Complete by:  As directed      Discharge wound care:    Complete by:  As directed   Home health RN will assist with wound care. Dry dressing change followed by ACE wrap every 4 days.     Increase activity slowly    Complete by:  As directed             Medication List  TAKE these medications        ACCU-CHEK AVIVA PLUS w/Device Kit  1 Device by Does not apply route 3 (three) times daily after meals.     ACCU-CHEK SOFTCLIX LANCETS lancets  1 each by Other route 3 (three) times daily.     atorvastatin 20 MG tablet  Commonly known as:  LIPITOR  Take 1 tablet (20 mg total) by mouth daily at 6 PM.     calcitRIOL 0.5 MCG capsule  Commonly known as:  ROCALTROL  Take 0.5 mcg by mouth daily.     ciprofloxacin 500 MG tablet  Commonly known as:  CIPRO  Take 1 tablet (500 mg total) by mouth 2 (two) times daily.     doxycycline 100 MG tablet  Commonly known as:  VIBRA-TABS  Take 1 tablet (100 mg total) by mouth 2 (two) times daily.     furosemide 40 MG tablet  Commonly known as:  LASIX  Take 40 mg by mouth daily.     gabapentin 600 MG tablet  Commonly known as:  NEURONTIN  Take 0.5 tablets (300 mg total) by mouth at bedtime.     glucose blood test strip  Commonly known as:  ACCU-CHEK AVIVA PLUS  1 each by Other route 3 (three) times daily.  E11.9     Insulin Glargine 100 UNIT/ML Solostar Pen  Commonly known as:  LANTUS SOLOSTAR  Inject 20 Units into the skin daily at 10 pm.  Start taking on:  08/15/2015     mycophenolate 360 MG Tbec EC tablet  Commonly known as:  MYFORTIC  Take 720 mg by mouth 2 (two) times daily.     oxyCODONE-acetaminophen 5-325 MG tablet  Commonly known as:  PERCOCET/ROXICET  Take 1 tablet by mouth every 8 (eight) hours as needed for moderate pain or severe pain.     predniSONE 5 MG tablet  Commonly known as:  DELTASONE  Take 5 mg by mouth daily with breakfast.     sodium bicarbonate 650 MG tablet  Take 1 tablet (650 mg total) by mouth 2 (two) times daily.     tacrolimus 1 MG capsule  Commonly known as:  PROGRAF  Take 4 mg by mouth 2 (two) times daily.       Follow-up Information    Follow up with GRAVES,JOHN L, MD. Schedule an appointment as soon as possible for a visit in 10 days.   Specialty:  Orthopedic Surgery   Why:  Postop follow-up., For wound re-check   Contact information:   [address] [place] [address] [phone]       Follow up with Lora Paula, MD. Schedule an appointment as soon as possible for a visit in 1 week.   Specialty:  Family Medicine   Why:  To be seen with repeat labs (CBC, renal panel & EKG to follow QTC).   Contact information:   [address] [place] [address] [phone]       Follow up with [facility], MD. Schedule an appointment as soon as possible for a visit in 2 weeks.   Specialty:  Nephrology   Contact information:   [address]                          [place] [address] [phone]        The results of significant diagnostics from this hospitalization (including imaging, microbiology, ancillary and laboratory) are listed below for reference.  Significant Diagnostic Studies: Portable Chest 1 View  08/06/2015  CLINICAL DATA:  55 year old male under preoperative evaluation prior to right foot amputation.  EXAM: PORTABLE CHEST 1 VIEW COMPARISON:  Chest x-ray 08/22/2014. FINDINGS: Linear scarring in the right mid lung is unchanged. No acute consolidative airspace disease. No pleural effusions. Chronic elevation of the right hemidiaphragm is unchanged. No evidence of pulmonary edema. Heart size is borderline enlarged. Upper mediastinal contours are within normal limits. Atherosclerosis in the thoracic aorta. IMPRESSION: 1. No radiographic evidence of acute cardiopulmonary disease. 2. Atherosclerosis. Electronically Signed   By: Vinnie Langton M.D.   On: 08/06/2015 17:01   Dg Foot 2 Views Right  08/08/2015  CLINICAL DATA:  Status post right foot amputation EXAM: RIGHT FOOT - 2 VIEW COMPARISON:  08/01/2015 FINDINGS: The patient is status post right forefoot amputation at the level of the base of the metatarsal bones. There are no complications identified. Overlying bandage material is noted. Vascular calcifications are present. IMPRESSION: 1. Status post forefoot amputation without complication. Electronically Signed   By: Kerby Moors M.D.   On: 08/08/2015 16:19    Microbiology: Recent Results (from the past 240 hour(s))  Culture, blood (single)     Status: None   Collection Time: 08/06/15 12:08 PM  Result Value Ref Range Status   Specimen Description BLOOD LEFT ARM  Final   Special Requests BOTTLES DRAWN AEROBIC AND ANAEROBIC 3CC  Final   Culture   Final    NO GROWTH 5 DAYS Performed at [facility]    Report Status 08/11/2015 FINAL  Final  Wound culture     Status: None   Collection Time: 08/06/15 12:21 PM  Result Value Ref Range Status   Specimen Description WOUND RIGHT FOOT TOE  Final   Special Requests Normal  Final   Gram Stain   Final    NO WBC SEEN NO SQUAMOUS EPITHELIAL CELLS SEEN ABUNDANT GRAM POSITIVE COCCI IN PAIRS ABUNDANT GRAM NEGATIVE COCCOBACILLI Performed at [facility]    Culture   Final    FEW DIPHTHEROIDS(CORYNEBACTERIUM SPECIES) Note: Standardized  susceptibility testing for this organism is not available. Performed at [facility]    Report Status 08/08/2015 FINAL  Final  Surgical pcr screen     Status: None   Collection Time: 08/08/15 12:31 PM  Result Value Ref Range Status   MRSA, PCR NEGATIVE NEGATIVE Final   Staphylococcus aureus NEGATIVE NEGATIVE Final    Comment:        The Xpert SA Assay (FDA approved for NASAL specimens in patients over 97 years of age), is one component of a comprehensive surveillance program.  Test performance has been validated by [facility] for patients greater than or equal to 66 year old. It is not intended to diagnose infection nor to guide or monitor treatment.      Labs: Basic Metabolic Panel:  Recent Labs Lab 08/09/15 0545 08/10/15 0546 08/11/15 7341 08/12/15 0558 08/13/15 0743 08/14/15 0838  NA 137 140 137 138 139 139  K 4.2 3.6 3.5 3.5 3.4* 3.5  CL 108 108 107 108 108 106  CO2 18* 21* 21* 21* 22 22  GLUCOSE 88 124* 138* 111* 98 153*  BUN 27* 27* 25* 26* 24* 24*  CREATININE 2.06* 1.94* 1.92* 2.01* 2.11* 2.14*  CALCIUM 8.6* 8.6* 8.5* 8.4* 8.5* 8.8*  PHOS 3.8 3.1 2.8  --   --   --    Liver Function Tests:  Recent Labs Lab 08/09/15 0545 08/10/15  4765 08/11/15 0628  ALBUMIN 1.7* 1.6* 1.6*   No results for input(s): LIPASE, AMYLASE in the last 168 hours. No results for input(s): AMMONIA in the last 168 hours. CBC:  Recent Labs Lab 08/10/15 0546 08/11/15 0628 08/12/15 0558 08/13/15 0743 08/14/15 0838  WBC 17.1* 13.8* 14.3* 16.5* 16.9*  HGB 8.3* 8.5* 8.1* 8.4* 9.0*  HCT 26.5* 27.4* 27.5* 27.5* 28.7*  MCV 83.9 83.5 83.6 84.9 84.7  PLT 292 299 317 316 324   Cardiac Enzymes: No results for input(s): CKTOTAL, CKMB, CKMBINDEX, TROPONINI in the last 168 hours. BNP: BNP (last 3 results) No results for input(s): BNP in the last 8760 hours.  ProBNP (last 3 results) No results for input(s): PROBNP in the last 8760 hours.  CBG:  Recent Labs Lab  08/13/15 1623 08/13/15 2036 08/13/15 2214 08/14/15 0733 08/14/15 1152  GLUCAP 201* 156* 168* 132* 154*       Signed:  Vernell Leep, MD, FACP, FHM. [facility] Pager [phone]  If 7PM-7AM, please contact night-coverage [email or web address] Password [email or web address] 08/14/2015, 3:31 PM

## 2015-08-14 NOTE — Care Management Note (Signed)
Case Management Note  Patient Details  Name: Trevor Thompson MRN: [id number] Date of Birth: Apr 02, 1961  Subjective/Objective:          CM following for progression and d/c planning.           Action/Plan: 08/14/2015 Met with pt who states that he is active with [facility] for [facility] services. This CM entered orders to resume HHRN and added HHPT. Pt states that he has rolling walker at home and has a post op shoe in the room. No other d/c needs identified. Will fax orders to [facility].   Expected Discharge Date:    08/14/2015              Expected Discharge Plan:  [place]  In-House Referral:  NA  Discharge planning Services  CM Consult  Post Acute Care Choice:  Durable Medical Equipment Choice offered to:  Patient  DME Arranged:   NONE, pt states that he has a rolling walker and postop shoe is in the room.  DME Agency:     HH Arranged:  RN, PT HH Agency:  [place]  Status of Service:  Completed, signed off  Medicare Important Message Given:  Yes Date Medicare IM Given:    Medicare IM give by:    Date Additional Medicare IM Given:    Additional Medicare Important Message give by:     If discussed at [place] of Stay Meetings, dates discussed:    Additional Comments:  Adron Bene, RN 08/14/2015, 12:18 PM

## 2015-08-14 NOTE — Progress Notes (Signed)
Subjective: 3 Days Post-Op Procedure(s) (LRB): EXCISIONAL DEBRIDEMENT OF SKIN, TISSUE, BONE OF RIGHT METATARSAL AND WOUND CLOSURE OF RIGHT FOOT.  (Right) Patient reports pain as mild. Has been up.   Objective: Vital signs in last 24 hours: Temp:  [98 F (36.7 C)-98.3 F (36.8 C)] 98.3 F (36.8 C) (02/09 0435) Pulse Rate:  [92-95] 92 (02/09 0435) Resp:  [18-21] 18 (02/09 0435) BP: (108-133)/(63-76) 133/76 mmHg (02/09 0435) SpO2:  [96 %-98 %] 98 % (02/09 0435)  Intake/Output from previous day: 02/08 0701 - 02/09 0700 In: 610 [P.O.:480; I.V.:30; IV Piggyback:100] Out: 1500 [Urine:1500] Intake/Output this shift:     Recent Labs  08/12/15 0558 08/13/15 0743  HGB 8.1* 8.4*    Recent Labs  08/12/15 0558 08/13/15 0743  WBC 14.3* 16.5*  RBC 3.29* 3.24*  HCT 27.5* 27.5*  PLT 317 316    Recent Labs  08/12/15 0558 08/13/15 0743  NA 138 139  K 3.5 3.4*  CL 108 108  CO2 21* 22  BUN 26* 24*  CREATININE 2.01* 2.11*  GLUCOSE 111* 98  CALCIUM 8.4* 8.5*   No results for input(s): LABPT, INR in the last 72 hours. Right foot exam: Wound looks  Good.2 Penrose drains intact.  Overall skin looks viable.  No redness or streaking up the leg.   Assessment/Plan: 3 Days Post-Op Procedure(s) (LRB): EXCISIONAL DEBRIDEMENT OF SKIN, TISSUE, BONE OF RIGHT METATARSAL AND WOUND CLOSURE OF RIGHT FOOT.  (Right)  Plan: Dressing changed.  Penrose drains are pulled. He may weight-bear with a postop shoe putting weight on heel.I would like him to elevate as much as possible. Will need home health, RN for a dressing change every 4 days. Followup with Dr. Luiz Blare in 2 weeks.   Desha Bitner G 08/14/2015, 8:55 AM

## 2015-08-14 NOTE — Discharge Instructions (Signed)
Bone and Joint Infections, Adult °Bone infections (osteomyelitis) and joint infections (septic arthritis) occur when bacteria or other germs get inside a bone or a joint. This can happen if you have an infection in another part of your body that spreads through your blood. Germs from your skin or from outside of your body can also cause this type of infection if you have a wound or a broken bone (fracture) that breaks the skin. °Anyone can get a bone infection or joint infection. You may be more likely to get this type of infection if you have a condition, such as diabetes, that lowers your ability to fight infection or increases your chances of getting an infection. Bone and joint infections can cause damage, and they can spread to other areas of your body. They need to be treated quickly. °CAUSES °Most bone and joint infections are caused by bacteria. They can also be caused by other germs, such as viruses and funguses. °RISK FACTORS °This condition is more likely to develop in: °· People who recently had surgery, especially bone or joint surgery. °· People who have a long-term (chronic) disease, such as: °¨ HIV (human immunodeficiency virus). °¨ Diabetes. °¨ Rheumatoid arthritis. °¨ Sickle cell anemia. °· Elderly people. °· People who take medicines that block or weaken the body's defense system (immune system). °· People who have a condition that reduces their blood flow. °· People who are on kidney dialysis. °· People who have an artificial joint. °· People who have had a joint or bone repaired with plates or screws (surgical hardware). °· People who use or abuse IV drugs. °· People who have had trauma, such as stepping on a nail. °SYMPTOMS °Symptoms vary depending on the type and location of your infection. Common symptoms of bone and joint infections include: °· Fever and chills. °· Redness and warmth. °· Swelling. °· Pain and stiffness. °· Drainage of fluid or pus near the infection. °· Weight loss and  fatigue. °· Decreased ability to use a hand or foot. °DIAGNOSIS °This condition may be diagnosed based on symptoms, medical history, a physical exam, and diagnostic tests. Tests can help to identify the cause of the infection. You may have various tests, such as: °· A sample of tissue, fluid, or blood taken to be examined under a microscope. °· A procedure to remove fluid from the infected joint with a needle (joint aspiration) for testing in a lab. °· Pus or discharge swabbed from a wound for testing to identify germs and to determine what type of medicine will kill them (culture and sensitivity). °· Blood tests to look for evidence of infection and inflammation (biomarkers). °· Imaging studies to determine how severe the bone or joint infection is. These may include: °¨ X-rays. °¨ CT scan. °¨ MRI. °¨ Bone scan. °TREATMENT °Treatment depends on the cause and type of infection. Antibiotic medicines are usually the first treatment for a bone or joint infection. Treatment with antibiotics may include: °· Getting IV antibiotics. This may be done in a hospital at first. You may have to continue IV antibiotics at home for several weeks. You may also have to take antibiotics by mouth for several weeks after that. °· Taking more than one kind of antibiotic. Treatment may start with a type of antibiotic that works against many different bacteria (broad spectrum antibiotics). IV antibiotics may be changed if tests show that another type may work better. °Other treatments may include: °· Draining fluid from the joint by placing a needle into   it (aspiration). °· Surgery to remove: °¨ Dead or dying tissue from a bone or joint. °¨ An infected artificial joint. °¨ Infected plates or screws that were used to repair a broken bone. °HOME CARE INSTRUCTIONS °· Take medicines only as directed by your health care provider. °· Take your antibiotic medicine as directed by your health care provider. Finish the antibiotic even if you start  to feel better. °· Follow instructions from your health care provider about how to take IV antibiotics at home. °· Ask your health care provider if you have any restrictions on your activities. °· Keep all follow-up visits as directed by your health care provider. This is important. °SEEK MEDICAL CARE IF: °· You have a fever or chills. °· You have redness, warmth, pain, or swelling that returns after treatment. °SEEK IMMEDIATE MEDICAL CARE IF: °· You have rapid breathing or you have trouble breathing. °· You have chest pain. °· You cannot drink fluids or make urine. °· The affected arm or leg swells, changes color, or turns blue. °  °This information is not intended to replace advice given to you by your health care provider. Make sure you discuss any questions you have with your health care provider. °  °Document Released: 06/21/2005 Document Revised: 11/05/2014 Document Reviewed: 06/19/2014 °Elsevier Interactive Patient Education ©2016 [employer] ° °

## 2015-08-21 ENCOUNTER — Ambulatory Visit: Payer: Medicare Other | Admitting: Podiatry

## 2015-08-27 MED ORDER — ACCU-CHEK AVIVA PLUS W/DEVICE KIT: 1.0000 | PACK | Freq: Three times a day (TID) | Status: DC

## 2015-08-27 NOTE — Addendum Note (Signed)
Addended by: Dessa Phi on: 08/27/2015 06:35 PM   Modules accepted: Orders

## 2015-08-28 ENCOUNTER — Ambulatory Visit: Payer: Medicare Other | Admitting: Podiatry

## 2015-08-28 ENCOUNTER — Encounter: Payer: Self-pay | Admitting: Family Medicine

## 2015-08-28 NOTE — Progress Notes (Signed)
Patient ID: Trevor Thompson, male   DOB: [birth year], 55 y.o.   MRN: [id number] PA initiated with patient's insurance through [email or web address] for patient's accu-chek aviva plus as it was rejected for exceeding plan limit.

## 2015-08-30 ENCOUNTER — Inpatient Hospital Stay (HOSPITAL_COMMUNITY)
Admission: EM | Admit: 2015-08-30 | Discharge: 2015-09-03 | DRG: 682 | Disposition: A | Discharge status: Home Health | Payer: Medicare Other | Attending: Internal Medicine | Admitting: Internal Medicine

## 2015-08-30 ENCOUNTER — Emergency Department (HOSPITAL_COMMUNITY): Payer: Medicare Other

## 2015-08-30 ENCOUNTER — Encounter (HOSPITAL_COMMUNITY): Payer: Self-pay

## 2015-08-30 DIAGNOSIS — E781 Pure hyperglyceridemia: Secondary | ICD-10-CM | POA: Diagnosis present

## 2015-08-30 DIAGNOSIS — I5022 Chronic systolic (congestive) heart failure: Secondary | ICD-10-CM | POA: Diagnosis present

## 2015-08-30 DIAGNOSIS — N183 Chronic kidney disease, stage 3 (moderate): Secondary | ICD-10-CM

## 2015-08-30 DIAGNOSIS — I130 Hypertensive heart and chronic kidney disease with heart failure and stage 1 through stage 4 chronic kidney disease, or unspecified chronic kidney disease: Secondary | ICD-10-CM | POA: Diagnosis present

## 2015-08-30 DIAGNOSIS — I10 Essential (primary) hypertension: Secondary | ICD-10-CM | POA: Diagnosis present

## 2015-08-30 DIAGNOSIS — E872 Acidosis: Secondary | ICD-10-CM | POA: Diagnosis present

## 2015-08-30 DIAGNOSIS — E1165 Type 2 diabetes mellitus with hyperglycemia: Secondary | ICD-10-CM | POA: Diagnosis present

## 2015-08-30 DIAGNOSIS — R6511 Systemic inflammatory response syndrome (SIRS) of non-infectious origin with acute organ dysfunction: Secondary | ICD-10-CM | POA: Diagnosis present

## 2015-08-30 DIAGNOSIS — Z794 Long term (current) use of insulin: Secondary | ICD-10-CM | POA: Diagnosis not present

## 2015-08-30 DIAGNOSIS — Z9841 Cataract extraction status, right eye: Secondary | ICD-10-CM

## 2015-08-30 DIAGNOSIS — E1159 Type 2 diabetes mellitus with other circulatory complications: Secondary | ICD-10-CM

## 2015-08-30 DIAGNOSIS — Z6830 Body mass index (BMI) 30.0-30.9, adult: Secondary | ICD-10-CM

## 2015-08-30 DIAGNOSIS — Z89519 Acquired absence of unspecified leg below knee: Secondary | ICD-10-CM | POA: Diagnosis not present

## 2015-08-30 DIAGNOSIS — K521 Toxic gastroenteritis and colitis: Secondary | ICD-10-CM | POA: Diagnosis present

## 2015-08-30 DIAGNOSIS — E43 Unspecified severe protein-calorie malnutrition: Secondary | ICD-10-CM | POA: Diagnosis present

## 2015-08-30 DIAGNOSIS — I352 Nonrheumatic aortic (valve) stenosis with insufficiency: Secondary | ICD-10-CM | POA: Diagnosis present

## 2015-08-30 DIAGNOSIS — G4733 Obstructive sleep apnea (adult) (pediatric): Secondary | ICD-10-CM | POA: Diagnosis present

## 2015-08-30 DIAGNOSIS — K219 Gastro-esophageal reflux disease without esophagitis: Secondary | ICD-10-CM | POA: Diagnosis present

## 2015-08-30 DIAGNOSIS — E119 Type 2 diabetes mellitus without complications: Secondary | ICD-10-CM

## 2015-08-30 DIAGNOSIS — Z940 Kidney transplant status: Secondary | ICD-10-CM

## 2015-08-30 DIAGNOSIS — N179 Acute kidney failure, unspecified: Principal | ICD-10-CM | POA: Diagnosis present

## 2015-08-30 DIAGNOSIS — Z961 Presence of intraocular lens: Secondary | ICD-10-CM | POA: Diagnosis present

## 2015-08-30 DIAGNOSIS — E1151 Type 2 diabetes mellitus with diabetic peripheral angiopathy without gangrene: Secondary | ICD-10-CM | POA: Diagnosis present

## 2015-08-30 DIAGNOSIS — J189 Pneumonia, unspecified organism: Secondary | ICD-10-CM | POA: Diagnosis not present

## 2015-08-30 DIAGNOSIS — G4734 Idiopathic sleep related nonobstructive alveolar hypoventilation: Secondary | ICD-10-CM | POA: Diagnosis not present

## 2015-08-30 DIAGNOSIS — E7800 Pure hypercholesterolemia, unspecified: Secondary | ICD-10-CM | POA: Diagnosis present

## 2015-08-30 DIAGNOSIS — Z9842 Cataract extraction status, left eye: Secondary | ICD-10-CM

## 2015-08-30 DIAGNOSIS — T364X5A Adverse effect of tetracyclines, initial encounter: Secondary | ICD-10-CM | POA: Diagnosis present

## 2015-08-30 DIAGNOSIS — Z89512 Acquired absence of left leg below knee: Secondary | ICD-10-CM | POA: Diagnosis not present

## 2015-08-30 DIAGNOSIS — Y92019 Unspecified place in single-family (private) house as the place of occurrence of the external cause: Secondary | ICD-10-CM

## 2015-08-30 DIAGNOSIS — A419 Sepsis, unspecified organism: Secondary | ICD-10-CM | POA: Diagnosis not present

## 2015-08-30 DIAGNOSIS — Z87891 Personal history of nicotine dependence: Secondary | ICD-10-CM

## 2015-08-30 DIAGNOSIS — D849 Immunodeficiency, unspecified: Secondary | ICD-10-CM | POA: Diagnosis not present

## 2015-08-30 DIAGNOSIS — J9811 Atelectasis: Secondary | ICD-10-CM | POA: Diagnosis present

## 2015-08-30 DIAGNOSIS — D649 Anemia, unspecified: Secondary | ICD-10-CM | POA: Diagnosis present

## 2015-08-30 DIAGNOSIS — E11319 Type 2 diabetes mellitus with unspecified diabetic retinopathy without macular edema: Secondary | ICD-10-CM | POA: Diagnosis present

## 2015-08-30 DIAGNOSIS — I248 Other forms of acute ischemic heart disease: Secondary | ICD-10-CM | POA: Diagnosis present

## 2015-08-30 DIAGNOSIS — J9600 Acute respiratory failure, unspecified whether with hypoxia or hypercapnia: Secondary | ICD-10-CM | POA: Diagnosis not present

## 2015-08-30 DIAGNOSIS — E1122 Type 2 diabetes mellitus with diabetic chronic kidney disease: Secondary | ICD-10-CM

## 2015-08-30 DIAGNOSIS — Z833 Family history of diabetes mellitus: Secondary | ICD-10-CM | POA: Diagnosis not present

## 2015-08-30 DIAGNOSIS — D899 Disorder involving the immune mechanism, unspecified: Secondary | ICD-10-CM

## 2015-08-30 DIAGNOSIS — E860 Dehydration: Secondary | ICD-10-CM | POA: Diagnosis present

## 2015-08-30 DIAGNOSIS — J9621 Acute and chronic respiratory failure with hypoxia: Secondary | ICD-10-CM | POA: Diagnosis present

## 2015-08-30 DIAGNOSIS — Z7952 Long term (current) use of systemic steroids: Secondary | ICD-10-CM | POA: Diagnosis not present

## 2015-08-30 LAB — COMPREHENSIVE METABOLIC PANEL
ALK PHOS: 52 U/L (ref 38–126)
ALT: 13 U/L — AB (ref 17–63)
AST: 28 U/L (ref 15–41)
Albumin: 2 g/dL — ABNORMAL LOW (ref 3.5–5.0)
Anion gap: 14 (ref 5–15)
BILIRUBIN TOTAL: 0.6 mg/dL (ref 0.3–1.2)
BUN: 37 mg/dL — AB (ref 6–20)
CALCIUM: 8.5 mg/dL — AB (ref 8.9–10.3)
CO2: 17 mmol/L — ABNORMAL LOW (ref 22–32)
CREATININE: 2.99 mg/dL — AB (ref 0.61–1.24)
Chloride: 109 mmol/L (ref 101–111)
GFR, EST AFRICAN AMERICAN: 26 mL/min — AB (ref >60)
GFR, EST NON AFRICAN AMERICAN: 22 mL/min — AB (ref >60)
Glucose, Bld: 103 mg/dL — ABNORMAL HIGH (ref 65–99)
Potassium: 4.6 mmol/L (ref 3.5–5.1)
Sodium: 140 mmol/L (ref 135–145)
TOTAL PROTEIN: 5.9 g/dL — AB (ref 6.5–8.1)

## 2015-08-30 LAB — CBC WITH DIFFERENTIAL/PLATELET
BASOS ABS: 0 10*3/uL (ref 0.0–0.1)
BASOS PCT: 0 %
EOS ABS: 0 10*3/uL (ref 0.0–0.7)
EOS PCT: 0 %
HCT: 36.2 % — ABNORMAL LOW (ref 39.0–52.0)
Hemoglobin: 11.1 g/dL — ABNORMAL LOW (ref 13.0–17.0)
LYMPHS ABS: 2 10*3/uL (ref 0.7–4.0)
Lymphocytes Relative: 35 %
MCH: 25.9 pg — AB (ref 26.0–34.0)
MCHC: 30.7 g/dL (ref 30.0–36.0)
MCV: 84.6 fL (ref 78.0–100.0)
Monocytes Absolute: 0.7 10*3/uL (ref 0.1–1.0)
Monocytes Relative: 13 %
Neutro Abs: 3 10*3/uL (ref 1.7–7.7)
Neutrophils Relative %: 52 %
PLATELETS: 122 10*3/uL — AB (ref 150–400)
RBC: 4.28 MIL/uL (ref 4.22–5.81)
RDW: 17.2 % — ABNORMAL HIGH (ref 11.5–15.5)
WBC: 5.8 10*3/uL (ref 4.0–10.5)

## 2015-08-30 LAB — CBG MONITORING, ED: Glucose-Capillary: 103 mg/dL — ABNORMAL HIGH (ref 65–99)

## 2015-08-30 LAB — I-STAT CHEM 8, ED
BUN: 42 mg/dL — AB (ref 6–20)
CALCIUM ION: 1.08 mmol/L — AB (ref 1.12–1.23)
CHLORIDE: 109 mmol/L (ref 101–111)
CREATININE: 3.1 mg/dL — AB (ref 0.61–1.24)
Glucose, Bld: 100 mg/dL — ABNORMAL HIGH (ref 65–99)
HCT: 37 % — ABNORMAL LOW (ref 39.0–52.0)
Hemoglobin: 12.6 g/dL — ABNORMAL LOW (ref 13.0–17.0)
Potassium: 4.5 mmol/L (ref 3.5–5.1)
SODIUM: 140 mmol/L (ref 135–145)
TCO2: 20 mmol/L (ref 0–100)

## 2015-08-30 LAB — I-STAT CG4 LACTIC ACID, ED: Lactic Acid, Venous: 2.09 mmol/L (ref 0.5–2.0)

## 2015-08-30 LAB — LIPASE, BLOOD: LIPASE: 36 U/L (ref 11–51)

## 2015-08-30 MED ORDER — VANCOMYCIN HCL 10 G IV SOLR
2000.0000 mg | Freq: Once | INTRAVENOUS | Status: AC
  Administered 2015-08-30: 2000 mg via INTRAVENOUS
  Filled 2015-08-30: qty 2000

## 2015-08-30 MED ORDER — DEXTROSE 5 % IV SOLN
2.0000 g | Freq: Once | INTRAVENOUS | Status: AC
  Administered 2015-08-30: 2 g via INTRAVENOUS
  Filled 2015-08-30: qty 2

## 2015-08-30 MED ORDER — SODIUM CHLORIDE 0.9 % IV BOLUS (SEPSIS)
500.0000 mL | INTRAVENOUS | Status: AC
  Administered 2015-08-30: 500 mL via INTRAVENOUS

## 2015-08-30 MED ORDER — METHYLPREDNISOLONE SODIUM SUCC 40 MG IJ SOLR
20.0000 mg | Freq: Once | INTRAMUSCULAR | Status: AC
  Administered 2015-08-30: 20 mg via INTRAVENOUS
  Filled 2015-08-30: qty 1

## 2015-08-30 MED ORDER — SODIUM CHLORIDE 0.9 % IV SOLN
1000.0000 mL | INTRAVENOUS | Status: DC
  Administered 2015-08-30: 1000 mL via INTRAVENOUS

## 2015-08-30 MED ORDER — DEXTROSE 5 % IV SOLN
2.0000 g | INTRAVENOUS | Status: DC
  Administered 2015-08-31 – 2015-09-02 (×3): 2 g via INTRAVENOUS
  Filled 2015-08-30 (×4): qty 2

## 2015-08-30 MED ORDER — VANCOMYCIN HCL 10 G IV SOLR
1500.0000 mg | INTRAVENOUS | Status: DC
  Administered 2015-09-01: 1500 mg via INTRAVENOUS
  Filled 2015-08-30 (×2): qty 1500

## 2015-08-30 MED ORDER — SODIUM CHLORIDE 0.9 % IV BOLUS (SEPSIS)
1000.0000 mL | INTRAVENOUS | Status: AC
  Administered 2015-08-30 (×3): 1000 mL via INTRAVENOUS

## 2015-08-30 MED ORDER — VANCOMYCIN HCL IN DEXTROSE 1-5 GM/200ML-% IV SOLN: 1000.0000 mg | Freq: Once | INTRAVENOUS | Status: DC

## 2015-08-30 MED ORDER — IPRATROPIUM-ALBUTEROL 0.5-2.5 (3) MG/3ML IN SOLN
3.0000 mL | Freq: Once | RESPIRATORY_TRACT | Status: AC
  Administered 2015-08-30: 3 mL via RESPIRATORY_TRACT
  Filled 2015-08-30: qty 3

## 2015-08-30 MED ORDER — AZTREONAM 2 G IJ SOLR: 2.0000 g | Freq: Once | INTRAMUSCULAR | Status: DC

## 2015-08-30 NOTE — ED Notes (Addendum)
Only able to collect 1 set of blood cultures due to difficult stick. Pt unable to urinate at this time, IV antibiotics to begin

## 2015-08-30 NOTE — ED Notes (Signed)
Pt notified of the need of urine. Pt stated he is unable to provide at this time. Urinal left at bedside.

## 2015-08-30 NOTE — ED Notes (Signed)
  CBG 103  

## 2015-08-30 NOTE — Progress Notes (Signed)
Pharmacy Antibiotic Note  Jusitn Thompson is a 55 y.o. male admitted on 08/30/2015 with pneumonia.  Pharmacy has been consulted for vanc/cefepime dosing. 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 SCr 3.1 on admit (2.14 on 2/9), normalized CrCl~27  Plan: - Vanc 2gm IV x 1; then  IV q48h - Cefepime 2gm IV x 1; then 2g IV q24h - Monitor clinical progress, c/s, renal function, abx plan/LOT - VT@SS  as indicated - Monitor renal function trend closely  Height:  (180.3 cm) Weight: 240 lb (108.863 kg) IBW/kg (Calculated) : 75.3  Temp (24hrs), Avg:98.6 F (37 C), Min:98.6 F (37 C), Max:98.6 F (37 C)  No results for input(s): WBC, CREATININE, LATICACIDVEN, VANCOTROUGH, VANCOPEAK, VANCORANDOM, GENTTROUGH, GENTPEAK, GENTRANDOM, TOBRATROUGH, TOBRAPEAK, TOBRARND, AMIKACINPEAK, AMIKACINTROU, AMIKACIN in the last 168 hours.  Estimated Creatinine Clearance: 48.9 mL/min (by C-G formula based on Cr of 2.14).    Allergies  Allergen Reactions  . Penicillins Other (See Comments)    Has patient had a PCN reaction causing immediate rash, facial/tongue/throat swelling, SOB or lightheadedness with hypotension: No Has patient had a PCN reaction causing severe rash involving mucus membranes or skin necrosis: No Has patient had a PCN reaction that required hospitalization. No Has patient had a PCN reaction occurring within the last 10 years: No If all of the above answers are "NO", then may proceed with Cephalosporin use.     Antimicrobials this admission: Vanc 2/25>>  Cefepime 2/25>>  Dose adjustments this admission: n/a  Microbiology results: 2/25 BCx:  2/25 UCx:    Babs Bertin, PharmD, BCPS Clinical Pharmacist Pager [phone] 08/30/2015 8:55 PM

## 2015-08-30 NOTE — ED Notes (Signed)
Per EMS, pt from home with complaint of n/v/d with chills x 4 days. Pt hasn't eaten in 3 days. Pt's SPO2 was 88% on RA, pt placed on 3L up to 97%. HR 116 sinus tach with PACs, BP 126/76, CBG 100. Pt A and O x 4.

## 2015-08-30 NOTE — H&P (Signed)
PCP: Minerva Ends, MD    Referring provider Raquel Sarna   Chief Complaint: Nausea vomiting diarrhea   HPI: Trevor Thompson is a 55 y.o. 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   Presented with  Nausea vomiting diarrhea and chills for the past 4 days poor by mouth intake. On arrival by EMS to home patient was noted to be hypoxic 80% room air and was placed on 3 L of oxygen. EMS noted that his heart rate was 116 blood pressure 126/76. He endorses some chest pain but worse with vomiting. Of note patient has been admitted 2 weeks ago for midfoot amputation by Dr. Berenice Primas. Of note patient is status post cadaveric renal transplant but has not been taking his immunosuppressive slightly secondary to nausea and vomiting   IN ER: White blood cell, 5.8 creatinine 3.1 up from baseline of 2.5. Lactic acid 2.09 Chest x-ray showing right basilar infiltrate Patient met sepsis criteria and was started on broad-spectrum antibiotics including vancomycin and cefepime Blood culture 1 was obtained secondary to difficult stick patient is not producing urine     Regarding pertinent past history: Patient has history of chronic kidney disease status post cadaveric renal transplant on prednisone Prograf has history of peripheral last good disease status post BKA on the left. 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  was discharged on 2 weeks of oral antibiotics including doxycycline and Cipro, home health to  apply dry dressing followed by Ace wrap every 4 days and weightbearing as tolerated patient has been using a walker Patient has history of diabetes with renal complications last hemoglobin A1c 6.7   Hospitalist was called for admission for sepsis in the setting of age  Review of Systems:    Pertinent positives include: Fevers, chills, fatigue, abdominal pain, nausea, vomiting, diarrhea anorexia  Constitutional:  No weight loss, night sweats,  weight loss  HEENT:  No headaches, Difficulty swallowing,Tooth/dental problems,Sore throat,  No sneezing, itching, ear ache, nasal congestion, post nasal drip,  Cardio-vascular:  No chest pain, Orthopnea, PND, anasarca, dizziness, palpitations.no Bilateral lower extremity swelling  GI:  No heartburn, indigestion,, change in bowel habits, loss of appetite, melena, blood in stool, hematemesis Resp:  no shortness of breath at rest. No dyspnea on exertion, No excess mucus, no productive cough, No non-productive cough, No coughing up of blood.No change in color of mucus.No wheezing. Skin:  no rash or lesions. No jaundice GU:  no dysuria, change in color of urine, no urgency or frequency. No straining to urinate.  No flank pain.  Musculoskeletal:  No joint pain or no joint swelling. No decreased range of motion. No back pain.  Psych:  No change in mood or affect. No depression or anxiety. No memory loss.  Neuro: no localizing neurological complaints, no tingling, no weakness, no double vision, no gait abnormality, no slurred speech, no confusion  Otherwise ROS are negative except for above, 10 systems were reviewed  Past Medical History: Past Medical History  Diagnosis Date  . Hypertension   . PVD (peripheral vascular disease) ([place])   . S/p cadaver renal transplant   .  Cold intolerance   . 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 GERD (gastroesophageal reflux disease)   . 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  Hypertriglyceridemia   . Diabetic retinopathy ([place])   . Sleep apnea     in the past .  Was cleared in 2004 to not wear CPap.  Dr in [place]  . High cholesterol   . Pneumonia 05/09/2013    "first time was today" (05/09/2013)  . Type II diabetes mellitus ([place])   . ESRD (end stage renal disease) ([place])     "no dialysis; had kidney transplant" (05/09/2013)   Past Surgical History  Procedure Laterality Date  . Kidney transplant Right 2004  . Cataract extraction w/ intraocular lens  implant, bilateral Bilateral ~ 1997  . Av fistula placement  1997-2004    "I've got 3; right upper arm; right forearm; left forearm "  . Av fistula repair  1997-2004    "multiple"  . Amputation  02/25/2012    Procedure: AMPUTATION DIGIT;  Surgeon: Rosetta Posner, MD;  Location: [facility] OR;  Service: Vascular;  Laterality: Left;  Left 2nd Toe Amputation, Incision and Drainage left lateral foot.  . Amputation  03/22/2012    Procedure: AMPUTATION BELOW KNEE;  Surgeon: Newt Minion, MD;  Location: [place];  Service: Orthopedics;  Laterality: Left;  Left Below Knee Amputation  . Amputation  04/21/2012    Procedure: AMPUTATION BELOW KNEE;  Surgeon: Newt Minion, MD;  Location: [place];  Service: Orthopedics;  Laterality: Left;  Left below knee amputation revision  . Abdominal aortagram N/A 02/22/2012    Procedure: ABDOMINAL Maxcine Ham;  Surgeon: Serafina Mitchell, MD;  Location: [facility] CATH LAB;  Service: Cardiovascular;  Laterality: N/A;  . Amputation Right 08/08/2015    Procedure: TRANSMET AMPUTATION FOOT;  Surgeon: Dorna Leitz, MD;  Location: [place];  Service: Orthopedics;  Laterality: Right;  Right Transmetatarsal amputation  . I&d extremity Right 08/11/2015    Procedure: EXCISIONAL DEBRIDEMENT OF SKIN, TISSUE, BONE OF RIGHT METATARSAL AND WOUND CLOSURE OF RIGHT FOOT. ;  Surgeon: Dorna Leitz, MD;  Location: [place];  Service: Orthopedics;  Laterality: Right;     Medications: Prior to Admission medications   Medication Sig Start Date End Date  Taking? Authorizing Provider  atorvastatin (LIPITOR) 20 MG tablet Take 1 tablet (20 mg total) by mouth daily at 6 PM. 08/14/15  Yes Modena Jansky, MD  calcitRIOL (ROCALTROL) 0.5 MCG capsule Take 0.5 mcg by mouth daily. 07/22/15  Yes Historical Provider, MD  ciprofloxacin (CIPRO) 500 MG tablet Take 1 tablet (500 mg total) by mouth 2 (two) times daily. 08/14/15  Yes Modena Jansky, MD  doxycycline (VIBRA-TABS) 100 MG tablet Take 1 tablet (100 mg total) by mouth 2 (two) times daily. 08/14/15  Yes Modena Jansky, MD  furosemide (LASIX) 40 MG tablet Take 40 mg by mouth daily. 02/08/15  Yes Historical Provider, MD  gabapentin (NEURONTIN) 600 MG tablet Take 0.5 tablets (300 mg total) by mouth at bedtime. 08/14/15  Yes Modena Jansky, MD  Insulin Glargine (LANTUS SOLOSTAR) 100 UNIT/ML Solostar Pen Inject 20 Units into the skin daily at 10 pm. Patient taking differently: Inject 50 Units into the skin daily at 10 pm.  08/15/15  Yes Modena Jansky, MD  mycophenolate (MYFORTIC) 360 MG TBEC EC tablet Take 720 mg by mouth 2 (two) times daily.    Yes Historical Provider, MD  oxyCODONE-acetaminophen (PERCOCET/ROXICET) 5-325 MG tablet Take 1 tablet  by mouth every 8 (eight) hours as needed for moderate pain or severe pain. 08/14/15  Yes Modena Jansky, MD  predniSONE (DELTASONE) 5 MG tablet Take 5 mg by mouth daily with breakfast.   Yes Historical Provider, MD  sodium bicarbonate 650 MG tablet Take 1 tablet (650 mg total) by mouth 2 (two) times daily. 08/14/15  Yes Modena Jansky, MD  tacrolimus (PROGRAF) 1 MG capsule Take 4 mg by mouth 2 (two) times daily.    Yes Historical Provider, MD  ACCU-CHEK SOFTCLIX LANCETS lancets 1 each by Other route 3 (three) times daily. 04/25/15   Josalyn Funches, MD  Blood Glucose Monitoring Suppl (ACCU-CHEK AVIVA PLUS) w/Device KIT 1 Device by Does not apply route 3 (three) times daily after meals. 08/27/15   Josalyn Funches, MD  glucose blood (ACCU-CHEK AVIVA PLUS) test strip 1 each by  Other route 3 (three) times daily. E11.9 04/25/15   Boykin Nearing, MD    Allergies:   Allergies  Allergen Reactions  . Penicillins Other (See Comments)    Has patient had a PCN reaction causing immediate rash, facial/tongue/throat swelling, SOB or lightheadedness with hypotension: No Has patient had a PCN reaction causing severe rash involving mucus membranes or skin necrosis: No Has patient had a PCN reaction that required hospitalization. No Has patient had a PCN reaction occurring within the last 10 years: No If all of the above answers are "NO", then may proceed with Cephalosporin use.     Social History:  Ambulatory walker   Lives at home  With family    reports that he quit smoking about 34 years ago. His smoking use included Cigarettes. He has a .9 pack-year smoking history. He has never used smokeless tobacco. He reports that he drinks about 1.8 oz of alcohol per week. He reports that he does not use illicit drugs.    Family History: family history includes Diabetes in his father and mother; Lupus in his sister.    Physical Exam: Patient Vitals for the past 24 hrs:  BP Temp Temp src Pulse Resp SpO2 Height Weight  08/30/15 2200 137/83 mmHg - - 118 17 93 % - -  08/30/15 2138 164/84 mmHg - - 118 22 100 % - -  08/30/15 2046 126/86 mmHg - - 115 20 95 % - -  08/30/15 2030 130/86 mmHg - - 116 18 90 % - -  08/30/15 2008 124/87 mmHg 98.6 F (37 C) Oral 118 20 94 % 5' 11"  (1.803 m) 108.863 kg (240 lb)  08/30/15 2004 - - - - - 97 % - -    1. General:  in No Acute distress 2. Psychological: Alert and Oriented 3. Head/ENT:     Dry Mucous Membranes                          Head Non traumatic, neck supple                          Normal  Dentition 4. SKIN:    decreased Skin turgor,  Skin clean Dry and intact no rash 5. Heart: Regular rate and rhythm no Murmur, Rub or gallop 6. Lungs:   no wheezes some crackles   7. Abdomen: Soft, non-tender, Non distended 8. Lower  extremities: no clubbing, cyanosis, left leg surgically absent right foot surgically absent 9. Neurologically Grossly intact, moving all 4 extremities equally 10. MSK: Normal range of motion  body mass index is 33.49 kg/(m^2).   Labs on Admission:   Results for orders placed or performed during the hospital encounter of 08/30/15 (from the past 24 hour(s))  Comprehensive metabolic panel     Status: Abnormal   Collection Time: 08/30/15  8:10 PM  Result Value Ref Range   Sodium 140 135 - 145 mmol/L   Potassium 4.6 3.5 - 5.1 mmol/L   Chloride 109 101 - 111 mmol/L   CO2 17 (L) 22 - 32 mmol/L   Glucose, Bld 103 (H) 65 - 99 mg/dL   BUN 37 (H) 6 - 20 mg/dL   Creatinine, Ser 2.99 (H) 0.61 - 1.24 mg/dL   Calcium 8.5 (L) 8.9 - 10.3 mg/dL   Total Protein 5.9 (L) 6.5 - 8.1 g/dL   Albumin 2.0 (L) 3.5 - 5.0 g/dL   AST 28 15 - 41 U/L   ALT 13 (L) 17 - 63 U/L   Alkaline Phosphatase 52 38 - 126 U/L   Total Bilirubin 0.6 0.3 - 1.2 mg/dL   GFR calc non Af Amer 22 (L) >60 mL/min   GFR calc Af Amer 26 (L) >60 mL/min   Anion gap 14 5 - 15  CBC WITH DIFFERENTIAL     Status: Abnormal   Collection Time: 08/30/15  8:10 PM  Result Value Ref Range   WBC 5.8 4.0 - 10.5 K/uL   RBC 4.28 4.22 - 5.81 MIL/uL   Hemoglobin 11.1 (L) 13.0 - 17.0 g/dL   HCT 36.2 (L) 39.0 - 52.0 %   MCV 84.6 78.0 - 100.0 fL   MCH 25.9 (L) 26.0 - 34.0 pg   MCHC 30.7 30.0 - 36.0 g/dL   RDW 17.2 (H) 11.5 - 15.5 %   Platelets 122 (L) 150 - 400 K/uL   Neutrophils Relative % 52 %   Neutro Abs 3.0 1.7 - 7.7 K/uL   Lymphocytes Relative 35 %   Lymphs Abs 2.0 0.7 - 4.0 K/uL   Monocytes Relative 13 %   Monocytes Absolute 0.7 0.1 - 1.0 K/uL   Eosinophils Relative 0 %   Eosinophils Absolute 0.0 0.0 - 0.7 K/uL   Basophils Relative 0 %   Basophils Absolute 0.0 0.0 - 0.1 K/uL  Lipase, blood     Status: None   Collection Time: 08/30/15  8:10 PM  Result Value Ref Range   Lipase 36 11 - 51 U/L  CBG monitoring, ED     Status: Abnormal    Collection Time: 08/30/15  8:17 PM  Result Value Ref Range   Glucose-Capillary 103 (H) 65 - 99 mg/dL  I-Stat CG4 Lactic Acid, ED  (not at  [facility])     Status: Abnormal   Collection Time: 08/30/15  9:17 PM  Result Value Ref Range   Lactic Acid, Venous 2.09 (HH) 0.5 - 2.0 mmol/L   Comment NOTIFIED PHYSICIAN   I-stat Chem 8, ED     Status: Abnormal   Collection Time: 08/30/15  9:26 PM  Result Value Ref Range   Sodium 140 135 - 145 mmol/L   Potassium 4.5 3.5 - 5.1 mmol/L   Chloride 109 101 - 111 mmol/L   BUN 42 (H) 6 - 20 mg/dL   Creatinine, Ser 3.10 (H) 0.61 - 1.24 mg/dL   Glucose, Bld 100 (H) 65 - 99 mg/dL   Calcium, Ion 1.08 (L) 1.12 - 1.23 mmol/L   TCO2 20 0 - 100 mmol/L   Hemoglobin 12.6 (L) 13.0 - 17.0 g/dL   HCT  37.0 (L) 39.0 - 52.0 %    UA ordered, patient refuses foley  Lab Results  Component Value Date   HGBA1C 6.7* 08/13/2015    Estimated Creatinine Clearance: 33.8 mL/min (by C-G formula based on Cr of 3.1).  BNP (last 3 results) No results for input(s): PROBNP in the last 8760 hours.  Other results:  I have pearsonaly reviewed this: ECG REPORT  Rate: 115  Rhythm: Sinus tachycardia ST&T Change: No ischemic changes QTC 439  Filed Weights   08/30/15 2008  Weight: 108.863 kg (240 lb)     Cultures:    Component Value Date/Time   SDES WOUND RIGHT FOOT TOE 08/06/2015 1221   SPECREQUEST Normal 08/06/2015 1221   CULT  08/06/2015 1221    FEW DIPHTHEROIDS(CORYNEBACTERIUM SPECIES) Note: Standardized susceptibility testing for this organism is not available. Performed at [place] 08/08/2015 FINAL 08/06/2015 1221     Radiological Exams on Admission: Dg Chest Port 1 View  08/30/2015  CLINICAL DATA:  Sepsis and cough with hypoxia EXAM: PORTABLE CHEST 1 VIEW COMPARISON:  08/06/2015 FINDINGS: Cardiac shadow is stable. The right hemidiaphragm is again elevated and stable. Some scarring is noted in the right mid lung. Increased density is  noted in the right medial lung base consistent with early infiltrate. No bony abnormality is noted. IMPRESSION: Evolving right basilar infiltrate medially. Electronically Signed   By: Inez Catalina M.D.   On: 08/30/2015 20:58    Chart has been reviewed  Family not  at  Bedside    Assessment/Plan  55 year old gentleman with history of peripheral vascular disease status post remote left BKA and right transmetatarsal amputation 2 weeks ago., History of diabetes, history of chronic kidney disease status post cadaveric renal transplant presents with nausea vomiting diarrhea and evidence of sepsis also was found to have infiltrate on chest x-ray worrisome for HCAP  Present on Admission:  . Sepsis ([place]) - Admit per Sepsis protocol likely source being pneumonia   - rehydrate with 75m/kg  - initiate broad spectrum antibiotics  Vancomycin and cefepim  -  obtain blood cultures  - Obtain serial lactic acid  - Obtain procalcitonin level  - Admit and monitor vital signs closely . Protein-calorie malnutrition, severe - check prealbumin in AM, nutritional consult once able to tolerate pO  . HTN (hypertension) - continue home meds . HCAP (healthcare-associated pneumonia)-  - will admit for treatment of HCAP will start on appropriate antibiotic coverage.   Obtain sputum cultures, blood cultures if febrile or if decompensates.  Provide oxygen as needed.  .Mar[facility] Kitchenacute on chronic renal failure the setting of kidney transplant - nephrology consult, while patient unable to tolerate by mouth we will continue and start her home immunosuppressives Solu-Medrol 20 mg every 12 hours nephrology was seen in the morning rehydrate and follow creatinine obtain urine electrolytes Nausea vomiting diarrhea - possibly secondary to viral gastroenteritis, although given recent antibiotic exposure also check for C. Difficile Diabetes mellitus type 2 with renal complications  - decrease Lantus to 20 units order sliding scale    Prophylaxis:   Lovenox   CODE STATUS:  FULL CODE as per patient    Disposition:   likely will need placement for rehabilitation                          Other plan as per orders.  I have spent a total of 56 min on this admission   Forrest Jaroszewski 08/30/2015,  11:51 PM   [facility]  Pager [phone]   after 2 AM please page floor coverage PA If 7AM-7PM, please contact the day team taking care of the patient  [email or web address]  Password [email or web address]

## 2015-08-30 NOTE — ED Provider Notes (Signed)
CSN: [id number]     Arrival date & time 08/30/15  2000 History   First MD Initiated Contact with Patient 08/30/15 2001     Chief Complaint  Patient presents with  . Nausea  . Emesis  . Influenza     (Consider location/radiation/quality/duration/timing/severity/associated sxs/prior Treatment) The history is provided by the patient and medical records.     Pt with hx HTN, PVD, DMII, ESRD s/p renal transplant p/w myalgia, productive cough, SOB, N/V/D, frequent urination x 4 days.  Has had chest pain only with vomiting.  Emesis is brown and black.  Has had diarrhea leaking out of him.  Has not checked his blood sugar in 3 days, has not eaten in 3-4 days.  Pt states baseline creatinine is 2.5.  Had midfoot amputation about 2 weeks ago with Dr Berenice Primas - has wound care nurse taking care of wound and has done well.    Past Medical History  Diagnosis Date  . Hypertension   . PVD (peripheral vascular disease) ([place])   . S/p cadaver renal transplant   . Cold intolerance   . Autonomic neuropathy due to diabetes ([employer])   . GERD (gastroesophageal reflux disease)   . 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 Hypertriglyceridemia   . Diabetic retinopathy ([place])   . Sleep apnea     in the past .  Was cleared in 2004 to not wear CPap.  Dr in [place]  . High cholesterol   . Pneumonia 05/09/2013    "first time was today" (05/09/2013)  . Type II diabetes mellitus ([place])   . ESRD (end stage renal disease) ([place])     "no dialysis; had kidney transplant" (05/09/2013)   Past Surgical History  Procedure Laterality Date  . Kidney transplant Right 2004  . Cataract extraction w/ intraocular lens  implant, bilateral Bilateral ~ 1997  . Av fistula placement  1997-2004    "I've got 3; right upper arm; right forearm; left forearm "  . Av fistula repair  1997-2004    "multiple"  . Amputation  02/25/2012    Procedure: AMPUTATION DIGIT;  Surgeon: Rosetta Posner, MD;  Location: [facility] OR;  Service: Vascular;   Laterality: Left;  Left 2nd Toe Amputation, Incision and Drainage left lateral foot.  . Amputation  03/22/2012    Procedure: AMPUTATION BELOW KNEE;  Surgeon: Newt Minion, MD;  Location: [place];  Service: Orthopedics;  Laterality: Left;  Left Below Knee Amputation  . Amputation  04/21/2012    Procedure: AMPUTATION BELOW KNEE;  Surgeon: Newt Minion, MD;  Location: [place];  Service: Orthopedics;  Laterality: Left;  Left below knee amputation revision  . Abdominal aortagram N/A 02/22/2012    Procedure: ABDOMINAL Maxcine Ham;  Surgeon: Serafina Mitchell, MD;  Location[facility] CATH LAB;  Service: Cardiovascular;  Laterality: N/A;  . Amputation Right 08/08/2015    Procedure: TRANSMET AMPUTATION FOOT;  Surgeon: Dorna Leitz, MD;  Location: [place];  Service: Orthopedics;  Laterality: Right;  Right Transmetatarsal amputation  . I&d extremity Right 08/11/2015    Procedure: EXCISIONAL DEBRIDEMENT OF SKIN, TISSUE, BONE OF RIGHT METATARSAL AND WOUND CLOSURE OF RIGHT FOOT. ;  Surgeon: Dorna Leitz, MD;  Location: [place];  Service: Orthopedics;  Laterality: Right;   Family History  Problem Relation Age of Onset  . Diabetes Mother   . Diabetes Father   . Lupus Sister    Social History  Substance Use Topics  . Smoking status: Former Smoker -- 0.30 packs/day for  3 years    Types: Cigarettes    Quit date: 07/05/1981  . Smokeless tobacco: Never Used  . Alcohol Use: 1.8 oz/week    3 Cans of beer per week     Comment: occasional    Review of Systems  All other systems reviewed and are negative.     Allergies  Penicillins  Home Medications   Prior to Admission medications   Medication Sig Start Date End Date Taking? Authorizing Provider  ACCU-CHEK SOFTCLIX LANCETS lancets 1 each by Other route 3 (three) times daily. 04/25/15   Josalyn Funches, MD  atorvastatin (LIPITOR) 20 MG tablet Take 1 tablet (20 mg total) by mouth daily at 6 PM. 08/14/15   Modena Jansky, MD  Blood Glucose Monitoring Suppl (ACCU-CHEK AVIVA  PLUS) w/Device KIT 1 Device by Does not apply route 3 (three) times daily after meals. 08/27/15   Josalyn Funches, MD  calcitRIOL (ROCALTROL) 0.5 MCG capsule Take 0.5 mcg by mouth daily. 07/22/15   Historical Provider, MD  ciprofloxacin (CIPRO) 500 MG tablet Take 1 tablet (500 mg total) by mouth 2 (two) times daily. 08/14/15   Modena Jansky, MD  doxycycline (VIBRA-TABS) 100 MG tablet Take 1 tablet (100 mg total) by mouth 2 (two) times daily. 08/14/15   Modena Jansky, MD  furosemide (LASIX) 40 MG tablet Take 40 mg by mouth daily. 02/08/15   Historical Provider, MD  gabapentin (NEURONTIN) 600 MG tablet Take 0.5 tablets (300 mg total) by mouth at bedtime. 08/14/15   Modena Jansky, MD  glucose blood (ACCU-CHEK AVIVA PLUS) test strip 1 each by Other route 3 (three) times daily. E11.9 04/25/15   Josalyn Funches, MD  Insulin Glargine (LANTUS SOLOSTAR) 100 UNIT/ML Solostar Pen Inject 20 Units into the skin daily at 10 pm. 08/15/15   Modena Jansky, MD  mycophenolate (MYFORTIC) 360 MG TBEC EC tablet Take 720 mg by mouth 2 (two) times daily.     Historical Provider, MD  oxyCODONE-acetaminophen (PERCOCET/ROXICET) 5-325 MG tablet Take 1 tablet by mouth every 8 (eight) hours as needed for moderate pain or severe pain. 08/14/15   Modena Jansky, MD  predniSONE (DELTASONE) 5 MG tablet Take 5 mg by mouth daily with breakfast.    Historical Provider, MD  sodium bicarbonate 650 MG tablet Take 1 tablet (650 mg total) by mouth 2 (two) times daily. 08/14/15   Modena Jansky, MD  tacrolimus (PROGRAF) 1 MG capsule Take 4 mg by mouth 2 (two) times daily.     Historical Provider, MD   BP 124/87 mmHg  Pulse 118  Temp(Src) 98.6 F (37 C) (Oral)  Resp 20  Ht '5\' 11"'$  (1.803 m)  Wt 108.863 kg  BMI 33.49 kg/m2  SpO2 94% Physical Exam  Constitutional: He appears well-developed and well-nourished. No distress.  HENT:  Head: Normocephalic and atraumatic.  Neck: Neck supple.  Cardiovascular: Tachycardia present.    Pulmonary/Chest: Effort normal. He has wheezes. He has no rales.  Left lung field with abnormal breath sounds throughout, primarily wheezing.  O2 by [place] in place  Abdominal: Soft. He exhibits no distension and no mass. There is no tenderness. There is no rebound and no guarding.  Musculoskeletal:  Remote left BKA.  Recent right distal foot amputation with staples intact.  No erythema, edema, warmth, discharge.    Neurological: He is alert. He exhibits normal muscle tone.  Skin: He is not diaphoretic.  Nursing note and vitals reviewed.   ED Course  Procedures (including  critical care time) Labs Review Labs Reviewed  COMPREHENSIVE METABOLIC PANEL - Abnormal; Notable for the following:    CO2 17 (*)    Glucose, Bld 103 (*)    BUN 37 (*)    Creatinine, Ser 2.99 (*)    Calcium 8.5 (*)    Total Protein 5.9 (*)    Albumin 2.0 (*)    ALT 13 (*)    GFR calc non Af Amer 22 (*)    GFR calc Af Amer 26 (*)    All other components within normal limits  CBC WITH DIFFERENTIAL/PLATELET - Abnormal; Notable for the following:    Hemoglobin 11.1 (*)    HCT 36.2 (*)    MCH 25.9 (*)    RDW 17.2 (*)    Platelets 122 (*)    All other components within normal limits  I-STAT CG4 LACTIC ACID, ED - Abnormal; Notable for the following:    Lactic Acid, Venous 2.09 (*)    All other components within normal limits  CBG MONITORING, ED - Abnormal; Notable for the following:    Glucose-Capillary 103 (*)    All other components within normal limits  I-STAT CHEM 8, ED - Abnormal; Notable for the following:    BUN 42 (*)    Creatinine, Ser 3.10 (*)    Glucose, Bld 100 (*)    Calcium, Ion 1.08 (*)    Hemoglobin 12.6 (*)    HCT 37.0 (*)    All other components within normal limits  CULTURE, BLOOD (ROUTINE X 2)  CULTURE, BLOOD (ROUTINE X 2)  URINE CULTURE  LIPASE, BLOOD  URINALYSIS, ROUTINE W REFLEX MICROSCOPIC (NOT AT [facility])    Imaging Review Dg Chest Port 1 View  08/30/2015  CLINICAL DATA:  Sepsis  and cough with hypoxia EXAM: PORTABLE CHEST 1 VIEW COMPARISON:  08/06/2015 FINDINGS: Cardiac shadow is stable. The right hemidiaphragm is again elevated and stable. Some scarring is noted in the right mid lung. Increased density is noted in the right medial lung base consistent with early infiltrate. No bony abnormality is noted. IMPRESSION: Evolving right basilar infiltrate medially. Electronically Signed   By: Inez Catalina M.D.   On: 08/30/2015 20:58   I have personally reviewed and evaluated these images and lab results as part of my medical decision-making.   EKG Interpretation   Date/Time:  Saturday August 30 2015 20:27:16 EST Ventricular Rate:  115 PR Interval:  161 QRS Duration: 87 QT Interval:  332 QTC Calculation: 459 R Axis:   60 Text Interpretation:  Sinus tachycardia Low voltage, extremity leads  Although rate has increased Otherwise no significant change Confirmed by  Tyrone Nine MD, Quillian Quince ([id number]) on 08/30/2015 8:44:23 PM       8:45 PM Discussed pt with Dr Tyrone Nine.  Sepsis workup in process.  Treating empirically for HCAP given recent hospitalization and surgery, productive cough, SOB, hypoxia, abnormal lung sounds on exam.    10:23 PM Admitted to [facility], Dr Roel Cluck accepting.  Will consult nephrology.   10:31 PM I spoke with Dr Jonnie Finner who recommends IV solu-medrol '20mg'$  Q 12 hours.  Will be seen by nephrology in the morning.    MDM   Final diagnoses:  HCAP (healthcare-associated pneumonia)    Pt with DM, ESRD s/p renal transplant, recent hospitalization for midfoot amputation p/w myalgias, cough, SOB, N/V/D.  Pt presented hypoxic, tachycardic.  Right foot wound did not appear infected.  CXR demonstrated pneumonia.  Labs significant for lactic acidosis, AKI, anemia.  Admitted to  [facility], nephrology to consult.      Clayton Bibles, PA-C 08/30/15 [place], DO 08/30/15 2356

## 2015-08-31 DIAGNOSIS — J9601 Acute respiratory failure with hypoxia: Secondary | ICD-10-CM

## 2015-08-31 DIAGNOSIS — E119 Type 2 diabetes mellitus without complications: Secondary | ICD-10-CM

## 2015-08-31 DIAGNOSIS — Z794 Long term (current) use of insulin: Secondary | ICD-10-CM

## 2015-08-31 DIAGNOSIS — D899 Disorder involving the immune mechanism, unspecified: Secondary | ICD-10-CM

## 2015-08-31 DIAGNOSIS — J189 Pneumonia, unspecified organism: Secondary | ICD-10-CM

## 2015-08-31 DIAGNOSIS — A419 Sepsis, unspecified organism: Secondary | ICD-10-CM

## 2015-08-31 LAB — COMPREHENSIVE METABOLIC PANEL
ALBUMIN: 1.7 g/dL — AB (ref 3.5–5.0)
ALT: 12 U/L — ABNORMAL LOW (ref 17–63)
ANION GAP: 11 (ref 5–15)
AST: 24 U/L (ref 15–41)
Alkaline Phosphatase: 45 U/L (ref 38–126)
BILIRUBIN TOTAL: 0.8 mg/dL (ref 0.3–1.2)
BUN: 37 mg/dL — ABNORMAL HIGH (ref 6–20)
CALCIUM: 7.7 mg/dL — AB (ref 8.9–10.3)
CO2: 16 mmol/L — ABNORMAL LOW (ref 22–32)
Chloride: 117 mmol/L — ABNORMAL HIGH (ref 101–111)
Creatinine, Ser: 2.88 mg/dL — ABNORMAL HIGH (ref 0.61–1.24)
GFR calc non Af Amer: 23 mL/min — ABNORMAL LOW (ref >60)
GFR, EST AFRICAN AMERICAN: 27 mL/min — AB (ref >60)
GLUCOSE: 117 mg/dL — AB (ref 65–99)
POTASSIUM: 4.8 mmol/L (ref 3.5–5.1)
Sodium: 144 mmol/L (ref 135–145)
TOTAL PROTEIN: 5.3 g/dL — AB (ref 6.5–8.1)

## 2015-08-31 LAB — PROCALCITONIN: PROCALCITONIN: 0.16 ng/mL

## 2015-08-31 LAB — CBC WITH DIFFERENTIAL/PLATELET
BASOS ABS: 0 10*3/uL (ref 0.0–0.1)
Basophils Relative: 0 %
Eosinophils Absolute: 0 10*3/uL (ref 0.0–0.7)
Eosinophils Relative: 0 %
HEMATOCRIT: 34 % — AB (ref 39.0–52.0)
HEMOGLOBIN: 10.2 g/dL — AB (ref 13.0–17.0)
LYMPHS PCT: 7 %
Lymphs Abs: 0.6 10*3/uL — ABNORMAL LOW (ref 0.7–4.0)
MCH: 25.6 pg — ABNORMAL LOW (ref 26.0–34.0)
MCHC: 30 g/dL (ref 30.0–36.0)
MCV: 85.4 fL (ref 78.0–100.0)
MONO ABS: 0.2 10*3/uL (ref 0.1–1.0)
Monocytes Relative: 3 %
NEUTROS ABS: 7.3 10*3/uL (ref 1.7–7.7)
NEUTROS PCT: 90 %
Platelets: 121 10*3/uL — ABNORMAL LOW (ref 150–400)
RBC: 3.98 MIL/uL — AB (ref 4.22–5.81)
RDW: 17.2 % — ABNORMAL HIGH (ref 11.5–15.5)
WBC: 8.1 10*3/uL (ref 4.0–10.5)

## 2015-08-31 LAB — URINALYSIS, ROUTINE W REFLEX MICROSCOPIC
Bilirubin Urine: NEGATIVE
GLUCOSE, UA: 100 mg/dL — AB
KETONES UR: NEGATIVE mg/dL
LEUKOCYTES UA: NEGATIVE
NITRITE: NEGATIVE
PH: 5.5 (ref 5.0–8.0)
Protein, ur: >300 mg/dL — AB
SPECIFIC GRAVITY, URINE: 1.017 (ref 1.005–1.030)

## 2015-08-31 LAB — I-STAT CG4 LACTIC ACID, ED: Lactic Acid, Venous: 0.75 mmol/L (ref 0.5–2.0)

## 2015-08-31 LAB — CBG MONITORING, ED
GLUCOSE-CAPILLARY: 124 mg/dL — AB (ref 65–99)
Glucose-Capillary: 152 mg/dL — ABNORMAL HIGH (ref 65–99)
Glucose-Capillary: 181 mg/dL — ABNORMAL HIGH (ref 65–99)

## 2015-08-31 LAB — GLUCOSE, CAPILLARY
GLUCOSE-CAPILLARY: 168 mg/dL — AB (ref 65–99)
GLUCOSE-CAPILLARY: 184 mg/dL — AB (ref 65–99)

## 2015-08-31 LAB — STREP PNEUMONIAE URINARY ANTIGEN: Strep Pneumo Urinary Antigen: NEGATIVE

## 2015-08-31 LAB — SODIUM, URINE, RANDOM: SODIUM UR: 59 mmol/L

## 2015-08-31 LAB — C DIFFICILE QUICK SCREEN W PCR REFLEX
C DIFFICILE (CDIFF) INTERP: NEGATIVE
C DIFFICLE (CDIFF) ANTIGEN: NEGATIVE
C Diff toxin: NEGATIVE

## 2015-08-31 LAB — CREATININE, URINE, RANDOM: CREATININE, URINE: 90.82 mg/dL

## 2015-08-31 LAB — LACTIC ACID, PLASMA
LACTIC ACID, VENOUS: 1.1 mmol/L (ref 0.5–2.0)
LACTIC ACID, VENOUS: 1.1 mmol/L (ref 0.5–2.0)

## 2015-08-31 LAB — URINE MICROSCOPIC-ADD ON

## 2015-08-31 LAB — TROPONIN I
TROPONIN I: 0.37 ng/mL — AB (ref <0.031)
TROPONIN I: 0.35 ng/mL — AB (ref <0.031)
Troponin I: 0.45 ng/mL — ABNORMAL HIGH (ref <0.031)

## 2015-08-31 MED ORDER — ATORVASTATIN CALCIUM 20 MG PO TABS
20.0000 mg | ORAL_TABLET | Freq: Every day | ORAL | Status: DC
  Administered 2015-08-31 – 2015-09-03 (×4): 20 mg via ORAL
  Filled 2015-08-31 (×4): qty 1

## 2015-08-31 MED ORDER — INSULIN ASPART 100 UNIT/ML ~~LOC~~ SOLN
0.0000 [IU] | SUBCUTANEOUS | Status: DC
  Administered 2015-08-31: 1 [IU] via SUBCUTANEOUS
  Administered 2015-08-31 – 2015-09-01 (×7): 2 [IU] via SUBCUTANEOUS
  Administered 2015-09-01: 3 [IU] via SUBCUTANEOUS
  Administered 2015-09-01: 2 [IU] via SUBCUTANEOUS
  Administered 2015-09-02 (×3): 3 [IU] via SUBCUTANEOUS
  Administered 2015-09-02 (×2): 5 [IU] via SUBCUTANEOUS
  Filled 2015-08-31 (×2): qty 1

## 2015-08-31 MED ORDER — ACETAMINOPHEN 325 MG PO TABS
650.0000 mg | ORAL_TABLET | Freq: Once | ORAL | Status: AC | PRN
  Administered 2015-08-31: 650 mg via ORAL
  Filled 2015-08-31: qty 2

## 2015-08-31 MED ORDER — DEXTROSE 5 % IV SOLN
1.0000 g | INTRAVENOUS | Status: DC
  Administered 2015-08-31: 1 g via INTRAVENOUS
  Filled 2015-08-31: qty 10

## 2015-08-31 MED ORDER — PNEUMOCOCCAL VAC POLYVALENT 25 MCG/0.5ML IJ INJ
0.5000 mL | INJECTION | INTRAMUSCULAR | Status: DC
  Filled 2015-08-31: qty 0.5

## 2015-08-31 MED ORDER — ONDANSETRON HCL 4 MG/2ML IJ SOLN: 4.0000 mg | Freq: Three times a day (TID) | INTRAMUSCULAR | Status: AC | PRN

## 2015-08-31 MED ORDER — SODIUM BICARBONATE 650 MG PO TABS
650.0000 mg | ORAL_TABLET | Freq: Two times a day (BID) | ORAL | Status: DC
  Administered 2015-08-31 – 2015-09-03 (×7): 650 mg via ORAL
  Filled 2015-08-31 (×7): qty 1

## 2015-08-31 MED ORDER — INSULIN GLARGINE 100 UNIT/ML ~~LOC~~ SOLN
20.0000 [IU] | Freq: Every day | SUBCUTANEOUS | Status: DC
  Administered 2015-08-31 – 2015-09-01 (×2): 20 [IU] via SUBCUTANEOUS
  Filled 2015-08-31 (×3): qty 0.2

## 2015-08-31 MED ORDER — METHYLPREDNISOLONE SODIUM SUCC 40 MG IJ SOLR
20.0000 mg | Freq: Two times a day (BID) | INTRAMUSCULAR | Status: DC
  Administered 2015-08-31 – 2015-09-02 (×5): 20 mg via INTRAVENOUS
  Filled 2015-08-31 (×5): qty 1

## 2015-08-31 MED ORDER — TACROLIMUS 1 MG PO CAPS
4.0000 mg | ORAL_CAPSULE | Freq: Two times a day (BID) | ORAL | Status: DC
  Administered 2015-08-31 – 2015-09-03 (×7): 4 mg via ORAL
  Filled 2015-08-31 (×8): qty 4

## 2015-08-31 MED ORDER — ENOXAPARIN SODIUM 30 MG/0.3ML ~~LOC~~ SOLN
30.0000 mg | SUBCUTANEOUS | Status: DC
  Administered 2015-08-31 – 2015-09-03 (×4): 30 mg via SUBCUTANEOUS
  Filled 2015-08-31 (×4): qty 0.3

## 2015-08-31 MED ORDER — GABAPENTIN 300 MG PO CAPS
300.0000 mg | ORAL_CAPSULE | Freq: Every day | ORAL | Status: DC
  Administered 2015-08-31 – 2015-09-02 (×3): 300 mg via ORAL
  Filled 2015-08-31 (×3): qty 1

## 2015-08-31 MED ORDER — CALCITRIOL 0.25 MCG PO CAPS
0.5000 ug | ORAL_CAPSULE | Freq: Every day | ORAL | Status: DC
  Administered 2015-08-31 – 2015-09-03 (×4): 0.5 ug via ORAL
  Filled 2015-08-31 (×3): qty 2
  Filled 2015-08-31: qty 1

## 2015-08-31 MED ORDER — ENSURE ENLIVE PO LIQD: 237.0000 mL | Freq: Two times a day (BID) | ORAL | Status: DC

## 2015-08-31 MED ORDER — DEXTROSE 5 % IV SOLN
500.0000 mg | INTRAVENOUS | Status: DC
  Administered 2015-08-31 – 2015-09-03 (×4): 500 mg via INTRAVENOUS
  Filled 2015-08-31 (×5): qty 500

## 2015-08-31 MED ORDER — SODIUM CHLORIDE 0.9 % IV SOLN
1000.0000 mL | INTRAVENOUS | Status: DC
  Administered 2015-08-31 – 2015-09-01 (×3): 1000 mL via INTRAVENOUS

## 2015-08-31 MED ORDER — MYCOPHENOLATE SODIUM 180 MG PO TBEC
720.0000 mg | DELAYED_RELEASE_TABLET | Freq: Two times a day (BID) | ORAL | Status: DC
  Administered 2015-08-31 – 2015-09-03 (×7): 720 mg via ORAL
  Filled 2015-08-31 (×8): qty 4

## 2015-08-31 NOTE — ED Notes (Signed)
Pt's HR up to 130 sinus tach, rectal temp taken, 99.9,  tylenol given

## 2015-08-31 NOTE — Progress Notes (Signed)
PROGRESS NOTE  Trevor Thompson ZOX:[id number] DOB: [birth year] DOA: 08/30/2015 PCP: Lora Paula, MD  HPI/Recap of past 24 hours:  Flat affect, denies sob, chest pain, RN reported patient sats dropped to mid 80's,bp elevated   Assessment/Plan: Active Problems:   Diabetes type 2, controlled (HCC)   HTN (hypertension)   S/p cadaver renal transplant   S/P BKA (below knee amputation) (HCC)   Protein-calorie malnutrition, severe   Sepsis (HCC)   HCAP (healthcare-associated pneumonia)   AKI (acute kidney injury) (HCC)  Sepsis  (patient was discharge on doxycycline two weeks ago, reported still taking it) in a immunosuppressed patient. Sepsis presented on admission, with sinus tachycardia, lactic acidosis, pna and hypoxia, Culture pending. On vanc/cefepime and zithro. Hold home meds lasix in the setting of sepsis  Acute hypoxic respiratory failure: likely secondary to pna, o2 88% on room air, initial on presentation, on oxygen supplement  Hcap: dose has cough, cxr right basilar infiltrate. On vanc/cefepime/zithro. Respiratory viral pathogen pcr pending, flu negative  N/v/d: check cdiff, gi pathogen  Troponin elevation, denies chest pain, no acute ekg changes, continue cycle cardiac enzymes, echo pending, cardiology consulted  Insulin dependent diabetes, last a1c 6.7 on 08/13/2015, on lantus 20qhs, and ssi  S/p transmetatarsal amputation on 2/3 and delayed primary wound closure on 2/6, may need to call ortho on Monday, patient reported he has an appointment scheduled on Monday to see ortho  S/p renal transplant on prednisone /mycophenolate/tacrolimus, check tacrolimus level, nephrology consulted  [place], not able to tolerate cpap per patient  Code Status: full  Family Communication: patient   Disposition Plan: admit to stepdown   Consultants:  Nephrology  cardiology  Procedures:  none  Antibiotics:  Vanc/cefepime/zithromax from admission  Rocephin x1 on  admission.   Objective: BP 146/94 mmHg  Pulse 95  Temp(Src) 98.6 F (37 C) (Oral)  Resp 22  Ht  (1.803 m)  Wt 108.863 kg (240 lb)  BMI 33.49 kg/m2  SpO2 94%  Intake/Output Summary (Last 24 hours) at 08/31/15 0924 Last data filed at 08/31/15 0001  Gross per 24 hour  Intake   3750 ml  Output    200 ml  Net   3550 ml   Filed Weights   08/30/15 2008  Weight: 108.863 kg (240 lb)    Exam:   General:  NAD  Cardiovascular: RRR  Respiratory: diminished, mild crackles at basis, but no wheezing, no rhonchi  Abdomen: Soft/ND/NT, positive BS  Musculoskeletal: No Edema, s/p left kba, s/p right transmetatarsal amputation dressing intact  Neuro: aaox3, flat affect  Data Reviewed: Basic Metabolic Panel:  Recent Labs Lab 08/30/15 2010 08/30/15 2126 08/31/15 0400  NA 140 140 144  K 4.6 4.5 4.8  CL 109 109 117*  CO2 17*  --  16*  GLUCOSE 103* 100* 117*  BUN 37* 42* 37*  CREATININE 2.99* 3.10* 2.88*  CALCIUM 8.5*  --  7.7*   Liver Function Tests:  Recent Labs Lab 08/30/15 2010 08/31/15 0400  AST 28 24  ALT 13* 12*  ALKPHOS 52 45  BILITOT 0.6 0.8  PROT 5.9* 5.3*  ALBUMIN 2.0* 1.7*    Recent Labs Lab 08/30/15 2010  LIPASE 36   No results for input(s): AMMONIA in the last 168 hours. CBC:  Recent Labs Lab 08/30/15 2010 08/30/15 2126 08/31/15 0400  WBC 5.8  --  8.1  NEUTROABS 3.0  --  7.3  HGB 11.1* 12.6* 10.2*  HCT 36.2* 37.0* 34.0*  MCV 84.6  --  85.4  PLT 122*  --  121*   Cardiac Enzymes:    Recent Labs Lab 08/31/15 0400  TROPONINI 0.45*   BNP (last 3 results) No results for input(s): BNP in the last 8760 hours.  ProBNP (last 3 results) No results for input(s): PROBNP in the last 8760 hours.  CBG:  Recent Labs Lab 08/30/15 2017 08/31/15 0425 08/31/15 0727  GLUCAP 103* 124* 152*    No results found for this or any previous visit (from the past 240 hour(s)).   Studies: Dg Chest Port 1 View  08/30/2015  CLINICAL DATA:   Sepsis and cough with hypoxia EXAM: PORTABLE CHEST 1 VIEW COMPARISON:  08/06/2015 FINDINGS: Cardiac shadow is stable. The right hemidiaphragm is again elevated and stable. Some scarring is noted in the right mid lung. Increased density is noted in the right medial lung base consistent with early infiltrate. No bony abnormality is noted. IMPRESSION: Evolving right basilar infiltrate medially. Electronically Signed   By: Alcide Clever M.D.   On: 08/30/2015 20:58    Scheduled Meds: . atorvastatin  20 mg Oral q1800  . calcitRIOL  0.5 mcg Oral Daily  . enoxaparin (LOVENOX) injection  30 mg Subcutaneous Q24H  . gabapentin  300 mg Oral QHS  . insulin aspart  0-9 Units Subcutaneous 6 times per day  . insulin glargine  20 Units Subcutaneous Q2200  . methylPREDNISolone (SOLU-MEDROL) injection  20 mg Intravenous Q12H  . sodium bicarbonate  650 mg Oral BID    Continuous Infusions: . sodium chloride 1,000 mL (08/31/15 0413)  . azithromycin Stopped (08/31/15 0512)  . ceFEPime (MAXIPIME) IV    . cefTRIAXone (ROCEPHIN)  IV Stopped (08/31/15 0442)  . vancomycin       Time spent:  Armelia Penton MD, PhD  [facility] Pager [phone]. If 7PM-7AM, please contact night-coverage at [email or web address], password [facility] 08/31/2015, 9:24 AM  LOS: 1 day

## 2015-08-31 NOTE — ED Notes (Signed)
Admitting Dr called and this RN informed her of elevated troponin level. She ordered to just continue to monitor q6h troponins and notify her if pt begins having CP. Pt is pain free at this time.

## 2015-08-31 NOTE — Consult Note (Signed)
Renal Service Consult Note [facility]  Trevor Thompson 08/31/2015 [place] Requesting Physician:  Dr Roel Cluck  Reason for Consult:  Acute on CRF of transplant HPI: The patient is a 55 y.o. year-old with hx of DM, HTN, R TMA, L BKA presenting with cough, chills, malaise, fatigue. Admitted, creat 2.9 up from baseline of 2.0.  Rec'd IV abx for PNA and IVF's.  Feeling much better today.  Creat down to 2.8.     Chart review: Aug 2013 - fever, cellulitis left foot/ necrotic toe, hx renal Tx, DM 1, HTN, neuropathy > 2nd toe amp, iv abx Oct 2013 - dehisced left BKA stump. Revised surgically.  Nov 2014 - cough/fever/ nausea/ HA, ^BS. Hx renal Tx 2004 in [place] > dx was sepsis due to CAP Jan 2015 - Ecoli pyelo w bacteremia/ sepsis rx w Abx.  Acute hypoxic respo failure, AKI of transplant Feb 2016 - syncope vasovagal; improved with IVF's Feb 2016 - Ecoli urosepsis, likely pyelo of transplant kidney.  +blood cx's. Rx with cefepime then cipro. A/C renal failure of CRT.  DM2 Feb 1- 9, 2017 > foot infection, underwent R foot TMA on 2/3, revision of TMA on 2/6.  IV abx.  A/C renal faiulre, creat 2.3 at baseline.  DM2.     ROS  no abd pain   no joitn pain  no dysuria  no pain over transplant  no SOB   Past Medical History  Past Medical History  Diagnosis Date  . Hypertension   . PVD (peripheral vascular disease) ([place])   . S/p cadaver renal transplant   . Cold intolerance   . Autonomic neuropathy due to diabetes ([place])   . GERD (gastroesophageal reflux disease)   . 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 Hypertriglyceridemia   . Diabetic retinopathy ([place])   . Sleep apnea     in the past .  Was cleared in 2004 to not wear CPap.  Dr in [place]  . High cholesterol   . Pneumonia 05/09/2013    "first time was today" (05/09/2013)  . Type II diabetes mellitus ([place])   . ESRD (end stage renal disease) ([place])     "no dialysis; had kidney transplant" (05/09/2013)   Past  Surgical History  Past Surgical History  Procedure Laterality Date  . Kidney transplant Right 2004  . Cataract extraction w/ intraocular lens  implant, bilateral Bilateral ~ 1997  . Av fistula placement  1997-2004    "I've got 3; right upper arm; right forearm; left forearm "  . Av fistula repair  1997-2004    "multiple"  . Amputation  02/25/2012    Procedure: AMPUTATION DIGIT;  Surgeon: Rosetta Posner, MD;  Location: [facility] OR;  Service: Vascular;  Laterality: Left;  Left 2nd Toe Amputation, Incision and Drainage left lateral foot.  . Amputation  03/22/2012    Procedure: AMPUTATION BELOW KNEE;  Surgeon: Newt Minion, MD;  Location: [place];  Service: Orthopedics;  Laterality: Left;  Left Below Knee Amputation  . Amputation  04/21/2012    Procedure: AMPUTATION BELOW KNEE;  Surgeon: Newt Minion, MD;  Location: [place];  Service: Orthopedics;  Laterality: Left;  Left below knee amputation revision  . Abdominal aortagram N/A 02/22/2012    Procedure: ABDOMINAL Maxcine Ham;  Surgeon: Serafina Mitchell, MD;  Location: [facility] CATH LAB;  Service: Cardiovascular;  Laterality: N/A;  . Amputation Right 08/08/2015    Procedure: TRANSMET AMPUTATION FOOT;  Surgeon: Dorna Leitz, MD;  Location: [facility]  OR;  Service: Orthopedics;  Laterality: Right;  Right Transmetatarsal amputation  . I&d extremity Right 08/11/2015    Procedure: EXCISIONAL DEBRIDEMENT OF SKIN, TISSUE, BONE OF RIGHT METATARSAL AND WOUND CLOSURE OF RIGHT FOOT. ;  Surgeon: Dorna Leitz, MD;  Location: [place];  Service: Orthopedics;  Laterality: Right;   Family History  Family History  Problem Relation Age of Onset  . Diabetes Mother   . Diabetes Father   . Lupus Sister    Social History  reports that he quit smoking about 34 years ago. His smoking use included Cigarettes. He has a .9 pack-year smoking history. He has never used smokeless tobacco. He reports that he drinks about 1.8 oz of alcohol per week. He reports that he does not use illicit drugs. Allergies   Allergies  Allergen Reactions  . Penicillins Other (See Comments)    Has patient had a PCN reaction causing immediate rash, facial/tongue/throat swelling, SOB or lightheadedness with hypotension: No Has patient had a PCN reaction causing severe rash involving mucus membranes or skin necrosis: No Has patient had a PCN reaction that required hospitalization. No Has patient had a PCN reaction occurring within the last 10 years: No If all of the above answers are "NO", then may proceed with Cephalosporin use.    Home medications Prior to Admission medications   Medication Sig Start Date End Date Taking? Authorizing Provider  atorvastatin (LIPITOR) 20 MG tablet Take 1 tablet (20 mg total) by mouth daily at 6 PM. 08/14/15  Yes Modena Jansky, MD  calcitRIOL (ROCALTROL) 0.5 MCG capsule Take 0.5 mcg by mouth daily. 07/22/15  Yes Historical Provider, MD  ciprofloxacin (CIPRO) 500 MG tablet Take 1 tablet (500 mg total) by mouth 2 (two) times daily. 08/14/15  Yes Modena Jansky, MD  doxycycline (VIBRA-TABS) 100 MG tablet Take 1 tablet (100 mg total) by mouth 2 (two) times daily. 08/14/15  Yes Modena Jansky, MD  furosemide (LASIX) 40 MG tablet Take 40 mg by mouth daily. 02/08/15  Yes Historical Provider, MD  gabapentin (NEURONTIN) 600 MG tablet Take 0.5 tablets (300 mg total) by mouth at bedtime. 08/14/15  Yes Modena Jansky, MD  Insulin Glargine (LANTUS SOLOSTAR) 100 UNIT/ML Solostar Pen Inject 20 Units into the skin daily at 10 pm. Patient taking differently: Inject 50 Units into the skin daily at 10 pm.  08/15/15  Yes Modena Jansky, MD  mycophenolate (MYFORTIC) 360 MG TBEC EC tablet Take 720 mg by mouth 2 (two) times daily.    Yes Historical Provider, MD  predniSONE (DELTASONE) 5 MG tablet Take 5 mg by mouth daily with breakfast.   Yes Historical Provider, MD  sodium bicarbonate 650 MG tablet Take 1 tablet (650 mg total) by mouth 2 (two) times daily. 08/14/15  Yes Modena Jansky, MD  tacrolimus  (PROGRAF) 1 MG capsule Take 4 mg by mouth 2 (two) times daily.    Yes Historical Provider, MD  ACCU-CHEK SOFTCLIX LANCETS lancets 1 each by Other route 3 (three) times daily. 04/25/15   Josalyn Funches, MD  Blood Glucose Monitoring Suppl (ACCU-CHEK AVIVA PLUS) w/Device KIT 1 Device by Does not apply route 3 (three) times daily after meals. 08/27/15   Josalyn Funches, MD  glucose blood (ACCU-CHEK AVIVA PLUS) test strip 1 each by Other route 3 (three) times daily. E11.9 04/25/15   Boykin Nearing, MD   Liver Function Tests  Recent Labs Lab 08/30/15 2010 08/31/15 0400  AST 28 24  ALT 13* 12*  ALKPHOS 52 45  BILITOT 0.6 0.8  PROT 5.9* 5.3*  ALBUMIN 2.0* 1.7*    Recent Labs Lab 08/30/15 2010  LIPASE 36   CBC  Recent Labs Lab 08/30/15 2010 08/30/15 2126 08/31/15 0400  WBC 5.8  --  8.1  NEUTROABS 3.0  --  7.3  HGB 11.1* 12.6* 10.2*  HCT 36.2* 37.0* 34.0*  MCV 84.6  --  85.4  PLT 122*  --  206*   Basic Metabolic Panel  Recent Labs Lab 08/30/15 2010 08/30/15 2126 08/31/15 0400  NA 140 140 144  K 4.6 4.5 4.8  CL 109 109 117*  CO2 17*  --  16*  GLUCOSE 103* 100* 117*  BUN 37* 42* 37*  CREATININE 2.99* 3.10* 2.88*  CALCIUM 8.5*  --  7.7*    Filed Vitals:   08/31/15 1200 08/31/15 1300 08/31/15 1500 08/31/15 1551  BP: 141/82 149/98 139/92 161/96  Pulse: 90 92 96 100  Temp:    98.5 F (36.9 C)  TempSrc:    Oral  Resp: 19 19 23    Height:    5' 11"  (1.803 m)  Weight:    98.7 kg (217 lb 9.5 oz)  SpO2: 94% 95% 97% 93%   Exam Alert, no distress, calm No rash, cyanosis or gangrene Sclera anicteric, throat clear No JVD flat nv's Chest clear bilat RRR no mrg Abd soft ntnd no mass or ascites MS good muscle tone, no joint changes Ext no edema, R foot TMA wrapped, L BKA Neuro alert, no distress  UA gran cast, prot > 300 (as prior), 0-5 wbc, 6-30 rbc   Assessment: 1 Acute on CRF - vol depletion main issue. Creat down w IVF's today.  Immunosuppressive meds  reviewed and are accurate.  Continue IVF"s, expect creat to improve towards baseline 2.0.  2 Renal transplant / CKD - original Tx 2004.  Baseline creat 2.0 3 PNA - improving on abx 4 DM2 insulin 5 HTN no bp meds, takes lasix only at home   Plan - continue IVF's, cont prograf/ myfortic/ pred.  No other suggestions, will sign off.   Kelly Splinter MD [employer] pager [phone]    cell [phone] 08/31/2015, 4:04 PM

## 2015-08-31 NOTE — ED Notes (Signed)
MD at bedside. 

## 2015-08-31 NOTE — Consult Note (Signed)
CARDIOLOGY CONSULT NOTE     Primary Care Physician: Lora Paula, MD Referring Physician:  Admit Date: 08/30/2015  Reason for consultation:  Trevor Thompson is a 55 y.o. 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   Presented to the ER with nausea and vomiting.  On arrival by EMS was found to have an O2 sat of 80%.  He was placed on 3L O2.  He was tachycardic with a HR of 115.  His WBC is 5.8, Cr 3.1 up from 2.5, lactate 2.09 with a CXR showing a right basilar infiltrate.  He is now on vancomycin and cefepime. He was found to have an elevated troponin while in the ER.  He says that he has no chest pain, shortness of breath, PND or orthopnea.    Today, he denies symptoms of palpitations, chest pain, shortness of breath, orthopnea, PND, lower extremity edema, dizziness, presyncope, syncope, or neurologic sequela. The patient is tolerating medications without difficulties and is otherwise without complaint today.   Past Medical History  Diagnosis Date  . Hypertension   . PVD (peripheral vascular disease) (HCC)   . S/p cadaver renal transplant   . Cold intolerance   . 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 GERD (gastroesophageal reflux disease)   . 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 Hypertriglyceridemia   . Diabetic retinopathy (HCC)   . Sleep apnea     in the past .  Was cleared in 2004 to not wear CPap.  Dr in [place]  . High cholesterol   . Pneumonia 05/09/2013    "first time was today" (05/09/2013)  . Type II diabetes mellitus (HCC)   . ESRD (end stage renal disease) (HCC)     "no dialysis; had  kidney transplant" (05/09/2013)   Past Surgical History  Procedure Laterality Date  . Kidney transplant Right 2004  . Cataract extraction w/ intraocular lens  implant, bilateral Bilateral ~ 1997  . Av fistula placement  1997-2004    "I've got 3; right upper arm; right forearm; left forearm "  . Av fistula repair  1997-2004    "multiple"  . Amputation  02/25/2012    Procedure: AMPUTATION DIGIT;  Surgeon: Larina Earthly, MD;  Location: [facility] OR;  Service: Vascular;  Laterality: Left;  Left 2nd Toe Amputation, Incision and Drainage left lateral foot.  . Amputation  03/22/2012    Procedure: AMPUTATION BELOW KNEE;  Surgeon: Nadara Mustard, MD;  Location: [facility] OR;  Service: Orthopedics;  Laterality: Left;  Left Below Knee Amputation  . Amputation  04/21/2012    Procedure: AMPUTATION BELOW KNEE;  Surgeon: Nadara Mustard, MD;  Location: [facility] OR;  Service: Orthopedics;  Laterality: Left;  Left below knee amputation revision  . Abdominal aortagram N/A 02/22/2012    Procedure: ABDOMINAL Ronny Flurry;  Surgeon: Nada Libman, MD;  Location: [facility] CATH LAB;  Service: Cardiovascular;  Laterality: N/A;  . Amputation Right 08/08/2015    Procedure: TRANSMET AMPUTATION FOOT;  Surgeon: Jodi Geralds, MD;  Location: [facility] OR;  Service: Orthopedics;  Laterality: Right;  Right Transmetatarsal amputation  . I&d extremity Right 08/11/2015    Procedure: EXCISIONAL DEBRIDEMENT OF SKIN, TISSUE, BONE OF RIGHT METATARSAL AND WOUND CLOSURE OF RIGHT  FOOT. ;  Surgeon: Jodi Geralds, MD;  Location: [facility] OR;  Service: Orthopedics;  Laterality: Right;    . atorvastatin  20 mg Oral q1800  . calcitRIOL  0.5 mcg Oral Daily  . enoxaparin (LOVENOX) injection  30 mg Subcutaneous Q24H  . gabapentin  300 mg Oral QHS  . insulin aspart  0-9 Units Subcutaneous 6 times per day  . insulin glargine  20 Units Subcutaneous Q2200  . methylPREDNISolone (SOLU-MEDROL) injection  20 mg Intravenous Q12H  . mycophenolate  720 mg Oral BID  . [START ON 09/01/2015]  pneumococcal 23 valent vaccine  0.5 mL Intramuscular Tomorrow-1000  . sodium bicarbonate  650 mg Oral BID  . tacrolimus  4 mg Oral BID   . sodium chloride Stopped (08/31/15 1219)  . azithromycin Stopped (08/31/15 0512)  . ceFEPime (MAXIPIME) IV    . vancomycin      Allergies  Allergen Reactions  . Penicillins Other (See Comments)    Has patient had a PCN reaction causing immediate rash, facial/tongue/throat swelling, SOB or lightheadedness with hypotension: No Has patient had a PCN reaction causing severe rash involving mucus membranes or skin necrosis: No Has patient had a PCN reaction that required hospitalization. No Has patient had a PCN reaction occurring within the last 10 years: No If all of the above answers are "NO", then may proceed with Cephalosporin use.     Social History   Social History  . Marital Status: Married    Spouse Name: N/A  . Number of Children: N/A  . Years of Education: N/A   Occupational History  . Not on file.   Social History Main Topics  . Smoking status: Former Smoker -- 0.30 packs/day for 3 years    Types: Cigarettes    Quit date: 07/05/1981  . Smokeless tobacco: Never Used  . Alcohol Use: 1.8 oz/week    3 Cans of beer per week     Comment: occasional  . Drug Use: No     Comment: 05/09/2013 "smoked pot in the 1970's; tried it again ~ 2010; stopped again"  . Sexual Activity: Yes   Other Topics Concern  . Not on file   Social History Narrative   Married   Patient wife is currently dealing with metastatic colon cancer     Family History  Problem Relation Age of Onset  . Diabetes Mother   . Diabetes Father   . Lupus Sister     ROS- All systems are reviewed and negative except as per the HPI above  Physical Exam: Telemetry: Filed Vitals:   08/31/15 1130 08/31/15 1200 08/31/15 1300 08/31/15 1500  BP: 158/96 141/82 149/98 139/92  Pulse: 93 90 92 96  Temp:      TempSrc:      Resp: Height:      Weight:        SpO2: 94% 94% 95% 97%    GEN- The patient is well appearing, alert and oriented x 3 today.   Head- normocephalic, atraumatic Eyes-  Sclera clear, conjunctiva pink Ears- hearing intact Oropharynx- clear Neck- supple, no JVP Lymph- no cervical lymphadenopathy Lungs- Clear to ausculation bilaterally, normal work of breathing Heart- Regular rate and rhythm, no murmurs, rubs or gallops, PMI not laterally displaced GI- soft, NT, ND, + BS Extremities- no clubbing, cyanosis, or edema MS- no significant deformity or atrophy Skin- no rash or lesion Psych- euthymic mood, full affect Neuro- strength and sensation are intact  EKG: sinus tachycardia  Labs:   Lab Results  Component Value Date   WBC 8.1 08/31/2015   HGB 10.2* 08/31/2015   HCT 34.0* 08/31/2015   MCV 85.4 08/31/2015   PLT 121* 08/31/2015    Recent Labs Lab 08/31/15 0400  NA 144  K 4.8  CL 117*  CO2 16*  BUN 37*  CREATININE 2.88*  CALCIUM 7.7*  PROT 5.3*  BILITOT 0.8  ALKPHOS 45  ALT 12*  AST 24  GLUCOSE 117*   Lab Results  Component Value Date   TROPONINI 0.37* 08/31/2015    Lab Results  Component Value Date   CHOL 232* 10/10/2014   CHOL 131 02/22/2012   Lab Results  Component Value Date   HDL 69 10/10/2014   HDL 64 02/22/2012   Lab Results  Component Value Date   LDLCALC 121* 10/10/2014   LDLCALC 41 02/22/2012   Lab Results  Component Value Date   TRIG 208* 10/10/2014   TRIG 128 02/22/2012   Lab Results  Component Value Date   CHOLHDL 3.4 10/10/2014   CHOLHDL 2.0 02/22/2012   No results found for: LDLDIRECT    Radiology: Evolving right basilar infiltrate medially.  Echo 2015: - Left ventricle: The cavity size was normal. Wall thickness was increased in a pattern of moderate LVH. Systolic function was vigorous. The estimated ejection fraction is >70%. Wall motion was normal; there were no regional wall motion abnormalities. Findings consistent with left ventricular  diastolic dysfunction, indeterminate grade. There is evidence of significantly elevated LA pressure (E/e' 20) - Aortic valve: Moderately to severely calcified annulus. Trileaflet; moderately thickened leaflets. The non-coronary cusp is severely calcified with restricted motion. There was mild stenosis. Trivial regurgitation. Valve area: 2.14cm^2(VTI). Valve area: 1.67cm^2 (Vmax). - Mitral valve: Moderately calcified annulus. Mildly thickened leaflets . - Left atrium: The atrium was moderately dilated. - Right ventricle: The cavity size was mildly dilated. - Right atrium: The atrium was mildly dilated. - Impressions: Vegetation is absent.  ASSESSMENT AND PLAN:  Elevated troponin: Currently, patient is chest pain free.  He has been vomiting for the last few days and is now tachycardic.  His elevated troponin is likely due to demand ischemia as his ECG shows no current signs of ischemia and he has had no chest pain.  Agree at this point, with supportive care with antibiotics and treatment of likely pneumonia.  Sepsis 2/2 HCAP: on cefepime and vancomycin  HTN: continue current meds  Renal transplant: per renal  Roberts Bon Jorja Loa, MD 08/31/2015  3:13 PM

## 2015-08-31 NOTE — ED Provider Notes (Signed)
EKG Interpretation  Date/Time:  Sunday August 31 2015 04:26:16 EST Ventricular Rate:  102 PR Interval:  174 QRS Duration: 80 QT Interval:  357 QTC Calculation: 465 R Axis:   101 Text Interpretation:  Sinus tachycardia Right axis deviation Low voltage, extremity and precordial leads Abnormal R-wave progression, early transition No significant change since last tracing 30 Aug 2015 Confirmed by Zala Degrasse  MD-I, Garrus Gauthreaux ([id number]) on 08/31/2015 6:21:15 AM        Devoria Albe, MD 08/31/15 [phone]

## 2015-08-31 NOTE — ED Notes (Signed)
Attempted report. #[id number]

## 2015-08-31 NOTE — ED Notes (Signed)
MICRO CALLED STS NEEDS AT LEAST 5CC of stool for C.diff and GI pathogen testing.

## 2015-09-01 ENCOUNTER — Inpatient Hospital Stay (HOSPITAL_COMMUNITY): Payer: Medicare Other

## 2015-09-01 ENCOUNTER — Inpatient Hospital Stay: Payer: Medicare Other | Admitting: Family Medicine

## 2015-09-01 DIAGNOSIS — J9600 Acute respiratory failure, unspecified whether with hypoxia or hypercapnia: Secondary | ICD-10-CM

## 2015-09-01 DIAGNOSIS — G4734 Idiopathic sleep related nonobstructive alveolar hypoventilation: Secondary | ICD-10-CM

## 2015-09-01 LAB — GASTROINTESTINAL PANEL BY PCR, STOOL (REPLACES STOOL CULTURE)
Adenovirus F40/41: NOT DETECTED
Astrovirus: NOT DETECTED
Campylobacter species: NOT DETECTED
Cryptosporidium: NOT DETECTED
Cyclospora cayetanensis: NOT DETECTED
E. COLI O157: NOT DETECTED
ENTAMOEBA HISTOLYTICA: NOT DETECTED
ENTEROTOXIGENIC E COLI (ETEC): NOT DETECTED
Enteroaggregative E coli (EAEC): NOT DETECTED
Enteropathogenic E coli (EPEC): NOT DETECTED
Giardia lamblia: NOT DETECTED
NOROVIRUS GI/GII: NOT DETECTED
PLESIMONAS SHIGELLOIDES: NOT DETECTED
ROTAVIRUS A: NOT DETECTED
SALMONELLA SPECIES: NOT DETECTED
SAPOVIRUS (I, II, IV, AND V): NOT DETECTED
SHIGA LIKE TOXIN PRODUCING E COLI (STEC): NOT DETECTED
SHIGELLA/ENTEROINVASIVE E COLI (EIEC): NOT DETECTED
VIBRIO CHOLERAE: NOT DETECTED
Vibrio species: NOT DETECTED
Yersinia enterocolitica: NOT DETECTED

## 2015-09-01 LAB — BASIC METABOLIC PANEL WITH GFR
Anion gap: 11 (ref 5–15)
BUN: 41 mg/dL — ABNORMAL HIGH (ref 6–20)
CO2: 16 mmol/L — ABNORMAL LOW (ref 22–32)
Calcium: 7.9 mg/dL — ABNORMAL LOW (ref 8.9–10.3)
Chloride: 112 mmol/L — ABNORMAL HIGH (ref 101–111)
Creatinine, Ser: 3.1 mg/dL — ABNORMAL HIGH (ref 0.61–1.24)
GFR calc Af Amer: 24 mL/min — ABNORMAL LOW
GFR calc non Af Amer: 21 mL/min — ABNORMAL LOW
Glucose, Bld: 218 mg/dL — ABNORMAL HIGH (ref 65–99)
Potassium: 4.6 mmol/L (ref 3.5–5.1)
Sodium: 139 mmol/L (ref 135–145)

## 2015-09-01 LAB — GLUCOSE, CAPILLARY
GLUCOSE-CAPILLARY: 173 mg/dL — AB (ref 65–99)
GLUCOSE-CAPILLARY: 179 mg/dL — AB (ref 65–99)
Glucose-Capillary: 214 mg/dL — ABNORMAL HIGH (ref 65–99)
Glucose-Capillary: 172 mg/dL — ABNORMAL HIGH (ref 65–99)
Glucose-Capillary: 157 mg/dL — ABNORMAL HIGH (ref 65–99)
Glucose-Capillary: 184 mg/dL — ABNORMAL HIGH (ref 65–99)

## 2015-09-01 LAB — HEMOGLOBIN A1C
HEMOGLOBIN A1C: 6.1 % — AB (ref 4.8–5.6)
Mean Plasma Glucose: 128 mg/dL

## 2015-09-01 LAB — URINE CULTURE

## 2015-09-01 LAB — CBC
HCT: 32.8 % — ABNORMAL LOW (ref 39.0–52.0)
Hemoglobin: 9.9 g/dL — ABNORMAL LOW (ref 13.0–17.0)
MCH: 25.1 pg — ABNORMAL LOW (ref 26.0–34.0)
MCHC: 30.2 g/dL (ref 30.0–36.0)
MCV: 83.2 fL (ref 78.0–100.0)
Platelets: 126 K/uL — ABNORMAL LOW (ref 150–400)
RBC: 3.94 MIL/uL — ABNORMAL LOW (ref 4.22–5.81)
RDW: 17 % — ABNORMAL HIGH (ref 11.5–15.5)
WBC: 3.3 K/uL — ABNORMAL LOW (ref 4.0–10.5)

## 2015-09-01 LAB — TACROLIMUS LEVEL: TACROLIMUS (FK506) - LABCORP: 12 ng/mL (ref 2.0–20.0)

## 2015-09-01 NOTE — Progress Notes (Addendum)
PROGRESS NOTE  Trevor Thompson ZOX:[id number] DOB: 19-May-1961 DOA: 08/30/2015 PCP: Lora Paula, MD  HPI/Recap of past 24 hours:  Flat affect, denies sob, chest pain, reported feeling better, reported diarrhea 5times last night, although nursing documentaion with bmx1 last 24hrs, patient reported n/v has resolved, but no appetite. Per AM RN  patient has intermittent bradycardia , some time down to the 30's, but i have reviewed cv strip in epic all with NSR in the last 24hrs ,there is no documented bradycardia under vitals in epic.  there is also questionable nocturnal hypoxia down to 49% last night, Am RN not aware,  Urine output not accurate  RN reported patient sats dropped to mid 80's,bp elevated   Assessment/Plan: Active Problems:   Diabetes type 2, controlled (HCC)   HTN (hypertension)   S/p cadaver renal transplant   S/P BKA (below knee amputation) (HCC)   Protein-calorie malnutrition, severe   Sepsis (HCC)   HCAP (healthcare-associated pneumonia)   AKI (acute kidney injury) (HCC)  Sepsis  (patient was discharge on doxycycline two weeks ago, reported still taking it) in a immunosuppressed patient. Sepsis presented on admission, with sinus tachycardia, lactic acidosis, pna and hypoxia on admission, Culture pending. On vanc/cefepime and zithro. Hold home meds lasix in the setting of sepsis better  Acute hypoxic respiratory failure: likely secondary to pna, o2 88% on room air, initial on presentation, on oxygen supplement  Hcap: dose has cough, cxr right basilar infiltrate. On vanc/cefepime/zithro. Respiratory viral pathogen pcr pending, flu negative  N/v/d:  cdiff negative, gi pathogen pending, better  Troponin elevation, denies chest pain, no acute ekg changes, continue cycle cardiac enzymes, echo pending, cardiology consulted  Insulin dependent diabetes, last a1c 6.7 on 08/13/2015, on lantus 20qhs, and ssi  S/p transmetatarsal amputation on 2/3 and delayed primary wound  closure on 2/6, may need to call ortho on Monday, patient reported he has an appointment scheduled on Monday to see ortho, ortho consulted.  ARF on CKD III , S/p renal transplant on prednisone /mycophenolate/tacrolimus, check tacrolimus level, nephrology consulted. Per nephrology baseline cr 2. Cr on admission 3.1 then 2.88 then 3.1 again, ua with a few bacteria, urine culture pending, elevation of cr likely due to reported n/v/d and spesis. Close monitor cr, renal dosing meds.  Chronic respiratory failure with nocturnal hypoxia/ Osa, not able to tolerate cpap per patient, explained to patient he has nocturnal hypoxia, he will benefit from home oxygen, he reported he had that while [place] was in [place], but he does not think he needs it. Will consult care manager to investigate home nocturnal oxygen  Code Status: full  Family Communication: patient   Disposition Plan: remain in stepdown due to reported significant bradycardia and nocturnal hypoxia   Consultants:  Nephrology  cardiology  Procedures:  none  Antibiotics:  Vanc/cefepime/zithromax from admission  Rocephin x1 on admission.   Objective: BP 160/95 mmHg  Pulse 87  Temp(Src) 97.6 F (36.4 C) (Oral)  Resp 21  Ht 5\' 11"  (1.803 m)  Wt 98.7 kg (217 lb 9.5 oz)  BMI 30.36 kg/m2  SpO2 94%  Intake/Output Summary (Last 24 hours) at 09/01/15 1157 Last data filed at 09/01/15 0900  Gross per 24 hour  Intake 1791.67 ml  Output    200 ml  Net 1591.67 ml   Filed Weights   08/30/15 2008 08/31/15 1551  Weight: 108.863 kg (240 lb) 98.7 kg (217 lb 9.5 oz)    Exam:   General:  NAD  Cardiovascular:  RRR  Respiratory: diminished, mild crackles at basis, but no wheezing, no rhonchi  Abdomen: Soft/ND/NT, positive BS  Musculoskeletal: No Edema, s/p left kba, s/p right transmetatarsal amputation dressing intact  Neuro: aaox3, flat affect  Data Reviewed: Basic Metabolic Panel:  Recent Labs Lab 08/30/15 2010  08/30/15 2126 08/31/15 0400 09/01/15 0442  NA 140 140 144 139  K 4.6 4.5 4.8 4.6  CL 109 109 117* 112*  CO2 17*  --  16* 16*  GLUCOSE 103* 100* 117* 218*  BUN 37* 42* 37* 41*  CREATININE 2.99* 3.10* 2.88* 3.10*  CALCIUM 8.5*  --  7.7* 7.9*   Liver Function Tests:  Recent Labs Lab 08/30/15 2010 08/31/15 0400  AST 28 24  ALT 13* 12*  ALKPHOS 52 45  BILITOT 0.6 0.8  PROT 5.9* 5.3*  ALBUMIN 2.0* 1.7*    Recent Labs Lab 08/30/15 2010  LIPASE 36   No results for input(s): AMMONIA in the last 168 hours. CBC:  Recent Labs Lab 08/30/15 2010 08/30/15 2126 08/31/15 0400 09/01/15 0442  WBC 5.8  --  8.1 3.3*  NEUTROABS 3.0  --  7.3  --   HGB 11.1* 12.6* 10.2* 9.9*  HCT 36.2* 37.0* 34.0* 32.8*  MCV 84.6  --  85.4 83.2  PLT 122*  --  121* 126*   Cardiac Enzymes:    Recent Labs Lab 08/31/15 0400 08/31/15 1328 08/31/15 1603  TROPONINI 0.45* 0.37* 0.35*   BNP (last 3 results) No results for input(s): BNP in the last 8760 hours.  ProBNP (last 3 results) No results for input(s): PROBNP in the last 8760 hours.  CBG:  Recent Labs Lab 08/31/15 1624 08/31/15 1937 08/31/15 2318 09/01/15 0329 09/01/15 0717  GLUCAP 168* 184* 214* 173* 172*    Recent Results (from the past 240 hour(s))  Blood Culture (routine x 2)     Status: None (Preliminary result)   Collection Time: 08/30/15  8:38 PM  Result Value Ref Range Status   Specimen Description BLOOD RIGHT HAND  Final   Special Requests BOTTLES DRAWN AEROBIC AND ANAEROBIC 5CC  Final   Culture NO GROWTH < 24 HOURS  Final   Report Status PENDING  Incomplete  Urine culture     Status: None   Collection Time: 08/31/15 12:07 AM  Result Value Ref Range Status   Specimen Description URINE, CLEAN CATCH  Final   Special Requests NONE  Final   Culture 1,000 COLONIES/mL INSIGNIFICANT GROWTH  Final   Report Status 09/01/2015 FINAL  Final  C difficile quick scan w PCR reflex     Status: None   Collection Time: 08/31/15   2:39 PM  Result Value Ref Range Status   C Diff antigen NEGATIVE NEGATIVE Final   C Diff toxin NEGATIVE NEGATIVE Final   C Diff interpretation Negative for toxigenic C. difficile  Final     Studies: No results found.  Scheduled Meds: . atorvastatin  20 mg Oral q1800  . azithromycin  500 mg Intravenous Q24H  . calcitRIOL  0.5 mcg Oral Daily  . ceFEPime (MAXIPIME) IV  2 g Intravenous Q24H  . enoxaparin (LOVENOX) injection  30 mg Subcutaneous Q24H  . feeding supplement (ENSURE ENLIVE)  237 mL Oral BID BM  . gabapentin  300 mg Oral QHS  . insulin aspart  0-9 Units Subcutaneous 6 times per day  . insulin glargine  20 Units Subcutaneous Q2200  . methylPREDNISolone (SOLU-MEDROL) injection  20 mg Intravenous Q12H  . mycophenolate  720 mg  Oral BID  . pneumococcal 23 valent vaccine  0.5 mL Intramuscular Tomorrow-1000  . sodium bicarbonate  650 mg Oral BID  . tacrolimus  4 mg Oral BID  . vancomycin  1,500 mg Intravenous Q48H    Continuous Infusions: . sodium chloride 1,000 mL (08/31/15 1553)     Time spent:  Mackenzie Lia MD, PhD  [facility] Pager [phone]. If 7PM-7AM, please contact night-coverage at [email or web address], password [facility] 09/01/2015, 11:57 AM  LOS: 2 days

## 2015-09-01 NOTE — Progress Notes (Signed)
Echocardiogram 2D Echocardiogram has been performed.  Trevor Thompson 09/01/2015, 1:15 PM

## 2015-09-01 NOTE — Consult Note (Addendum)
WOC consult requested for foot wound. Pt recently had surgery which was performed by Dr Luiz Blare, according to the EMR.  He states he was due to go in today for a post-op appointment to have sutures removed, which he will miss related to this hospital admission.  He is well-informed regarding topical treatment and has been applying dry gauze and an ace wrap to protect the site and changing Q 3 days.  Discussed plan of care with Dr Roda Shutters of the primary team and they plan to consult ortho team to assess the wound while he is in the hospital.  Please refer to their team for further plan of care. Please re-consult if further assistance is needed.  Thank-you,  Cammie Hardebeck MSN, RN, CWOCN, [place], CNS [phone]

## 2015-09-01 NOTE — Care Management Important Message (Signed)
Important Message  Patient Details  Name: Keltin Baird MRN: [id number] Date of Birth: 07/12/1960   Medicare Important Message Given:  Yes    Leone Haven, RN 09/01/2015, 11:15 AMImportant Message  Patient Details  Name: Raquan Iannone MRN: [id number] Date of Birth: 1960-11-28   Medicare Important Message Given:  Yes    Leone Haven, RN 09/01/2015, 11:15 AM

## 2015-09-01 NOTE — Progress Notes (Addendum)
The patient is a 55 year old male who is 3-1/2 weeks status post right transmetatarsal amputation by Dr. Jodi Geralds. The patient was scheduled to be seen in our office today for staple/suture removal from his right foot surgery. He was admitted to the hospital over the weekend with pneumonia. We were asked to come by to remove his staples/sutures today.  Exam: The right foot wound overall looks good. There is one area of scabbing over the mid wound on the plantar surface without drainage. There is a small soft area which may be avascular but there is no frank sign of infection.  Assessment: 3-1/2 weeks status post right transmetatarsal amputation.  Plan: Staples and sutures were removed in their entirety today. The right foot was redressed with a 4 x 4 dressing and an Ace wrap. He may weight-bear as tolerated on the right putting weight on his heel wearing a postop shoe which the patient has at home. Suggest that he have someone bring it to the hospital for him. I will order dry dressing changes by nursing every 3 days. We will see him in the office in 2 weeks. Please call us if needed in the meantime. I are office number is [phone]. Thank you.

## 2015-09-01 NOTE — Clinical Documentation Improvement (Signed)
Internal Medicine  Can the diagnosis of anemia be further specified? Please document response in next progress note NOT in BPA drop down box. Thanks!   Iron deficiency Anemia  Nutritional anemia, including the nutrition or mineral deficits  Chronic Blood Loss Anemia, including the suspected or known cause  Anemia of chronic disease, including the associated chronic disease state  Other  Clinically Undetermined  Document any associated diagnoses/conditions.  Supporting Information:  H&H's running from 12.6/37 on admission to 9.9/32.8 this morning  Please exercise your independent, professional judgment when responding. A specific answer is not anticipated or expected.  Thank You,  Shellee Milo RN, BSN, CCDS Health Information Management [place] [phone]; Cell: [phone]

## 2015-09-01 NOTE — Progress Notes (Signed)
NP made aware of pt's POX. Will continue to monitor pt.

## 2015-09-01 NOTE — Progress Notes (Signed)
Pt sitting in bed talking to family. Pt POX 87-90 on RA. RN stated that pt may require to wear O2 if POX continue to drop. Pt stated that he did not want to wear O2 because it is drys out is nose. RN offered to have O2 humidified. Pt continued to refuse stated that he would just be oxygen deprived because he does not like the O2. Will continue to monitor pt.

## 2015-09-01 NOTE — Progress Notes (Signed)
Initial Nutrition Assessment  DOCUMENTATION CODES:   Severe malnutrition in context of chronic illness, Obesity unspecified  INTERVENTION:    Alter menu based on patient's preferences.  Patient refuses all supplements at this time.  NUTRITION DIAGNOSIS:   Malnutrition related to chronic illness as evidenced by energy intake < or equal to 75% for > or equal to 1 month, percent weight loss (17% weight loss in the past 6 months).  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 Presented with nausea, vomiting, chills, and poor oral intake x 4 days.   Patient reports weight loss over the past 6 months due to poor oral intake with nausea. He reports that he does not like the food here in the hospital. He has been consuming <50% of meals since admission. Nutrition focused physical exam completed.  No muscle or subcutaneous fat depletion noticed. Patient with 17% weight loss over the past 6 months. Intake has been less than 75% of usual intake, probably closer to half of usual intake per discussion with patient. He thinks he is lactose intolerant; milk and ice cream usually "run right through me." He also reports intolerance to Ensure and Boost Breeze supplements. He requests chicken tenders for meals, which should be allowed on a CHO modified diet. "I'll be just fine if you let me have chicken tenders." Have  discussed with [facility] to allow patient chicken tenders.   Diet Order:  Diet Carb Modified Fluid consistency:: Thin; Room service appropriate?: Yes  Skin:  Wound (see comment) (right foot incision from recent transmetatarsal amputation)  Last BM:  2/26  Height:   Ht Readings from Last 1 Encounters:  08/31/15  (1.803 m)    Weight:   Wt Readings from Last 1 Encounters:  08/31/15 217 lb 9.5 oz (98.7 kg)    Ideal Body Weight:  73.1 kg  BMI:  Body mass index is 30.36 kg/(m^2).  Estimated Nutritional Needs:   Kcal:  2000-2200  Protein:  125-135 gm  Fluid:  2-2.2 L  EDUCATION NEEDS:   No education needs identified at this time  Joaquin Courts, RD, LDN, CNSC Pager [phone] After Hours Pager [phone]

## 2015-09-01 NOTE — Progress Notes (Signed)
   09/01/15 1000  Clinical Encounter Type  Visited With Patient;Health care provider  Visit Type Initial;Psychological support;Spiritual support  Referral From Nurse  Spiritual Encounters  Spiritual Needs Prayer;Emotional;Grief support  Stress Factors  Patient Stress Factors Loss (Still grieving wife of 30+ yrs in 08-02-23.)  CH spent time with pt discussing his grief, loss and support; pt relayed stories of wife who died in 02-Aug-2023 after year long battle; he is full of grief and has been in and out of hospital for medical since death of spouse; pt focused eyes mostly away as it was difficult to discuss his situation and teared during prayer; CH will try and follow-up.  I concur with RN that grief group would benefit this pt. Erline Levine

## 2015-09-01 NOTE — Progress Notes (Addendum)
Pt still does not want to wear O2. RN educated pt on why he needed to wear O2 when sleeping related to apnea and that his POX was 49 during an apnea episode. Pt agreed to wear 2L Nasal cannula with humidity. Will continue to monitor pt.

## 2015-09-01 NOTE — Clinical Documentation Improvement (Signed)
Internal Medicine  Can the diagnosis of CKD be further specified? 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 Please document findings in next progress note NOT in BPA drop down box. Thanks!   CKD Stage I - GFR greater than or equal to 90  CKD Stage II - GFR 60-89  CKD Stage III - GFR 30-59  CKD Stage IV - GFR 15-29  CKD Stage V - GFR < 15  ESRD (End Stage Renal Disease)  Other condition  Unable to clinically determine  Supporting Information: : (risk factors, signs and symptoms, diagnostics, treatment)  Black male  GFR's running for current admission are: 24 to 26  Please exercise your independent, professional judgment when responding. A specific answer is not anticipated or expected.  Thank You, Shellee Milo RN, BSN, CCDS Health Information Management [place] [phone]; Cell: [phone]

## 2015-09-02 ENCOUNTER — Encounter: Payer: Self-pay | Admitting: Family Medicine

## 2015-09-02 ENCOUNTER — Telehealth: Payer: Self-pay | Admitting: Family Medicine

## 2015-09-02 DIAGNOSIS — G4733 Obstructive sleep apnea (adult) (pediatric): Secondary | ICD-10-CM | POA: Diagnosis present

## 2015-09-02 DIAGNOSIS — E1159 Type 2 diabetes mellitus with other circulatory complications: Secondary | ICD-10-CM

## 2015-09-02 DIAGNOSIS — I248 Other forms of acute ischemic heart disease: Secondary | ICD-10-CM

## 2015-09-02 DIAGNOSIS — D899 Disorder involving the immune mechanism, unspecified: Secondary | ICD-10-CM

## 2015-09-02 DIAGNOSIS — Z940 Kidney transplant status: Secondary | ICD-10-CM

## 2015-09-02 DIAGNOSIS — G4734 Idiopathic sleep related nonobstructive alveolar hypoventilation: Secondary | ICD-10-CM | POA: Diagnosis present

## 2015-09-02 DIAGNOSIS — D849 Immunodeficiency, unspecified: Secondary | ICD-10-CM

## 2015-09-02 DIAGNOSIS — Z89519 Acquired absence of unspecified leg below knee: Secondary | ICD-10-CM

## 2015-09-02 DIAGNOSIS — Z794 Long term (current) use of insulin: Principal | ICD-10-CM

## 2015-09-02 LAB — RESPIRATORY VIRUS PANEL
Adenovirus: NEGATIVE
INFLUENZA A: NEGATIVE
Influenza B: NEGATIVE
METAPNEUMOVIRUS: NEGATIVE
PARAINFLUENZA 2 A: NEGATIVE
PARAINFLUENZA 3 A: NEGATIVE
Parainfluenza 1: NEGATIVE
RESPIRATORY SYNCYTIAL VIRUS A: NEGATIVE
RHINOVIRUS: NEGATIVE
Respiratory Syncytial Virus B: NEGATIVE

## 2015-09-02 LAB — BASIC METABOLIC PANEL
Anion gap: 8 (ref 5–15)
BUN: 43 mg/dL — ABNORMAL HIGH (ref 6–20)
CO2: 18 mmol/L — ABNORMAL LOW (ref 22–32)
Calcium: 8 mg/dL — ABNORMAL LOW (ref 8.9–10.3)
Chloride: 113 mmol/L — ABNORMAL HIGH (ref 101–111)
Creatinine, Ser: 2.8 mg/dL — ABNORMAL HIGH (ref 0.61–1.24)
GFR calc Af Amer: 28 mL/min — ABNORMAL LOW (ref >60)
GFR calc non Af Amer: 24 mL/min — ABNORMAL LOW (ref >60)
Glucose, Bld: 289 mg/dL — ABNORMAL HIGH (ref 65–99)
Potassium: 4.4 mmol/L (ref 3.5–5.1)
Sodium: 139 mmol/L (ref 135–145)

## 2015-09-02 LAB — CBC
HCT: 33.9 % — ABNORMAL LOW (ref 39.0–52.0)
Hemoglobin: 10 g/dL — ABNORMAL LOW (ref 13.0–17.0)
MCH: 24.6 pg — ABNORMAL LOW (ref 26.0–34.0)
MCHC: 29.5 g/dL — ABNORMAL LOW (ref 30.0–36.0)
MCV: 83.5 fL (ref 78.0–100.0)
Platelets: 132 10*3/uL — ABNORMAL LOW (ref 150–400)
RBC: 4.06 MIL/uL — ABNORMAL LOW (ref 4.22–5.81)
RDW: 16.9 % — ABNORMAL HIGH (ref 11.5–15.5)
WBC: 3.6 10*3/uL — ABNORMAL LOW (ref 4.0–10.5)

## 2015-09-02 LAB — IRON AND TIBC
Iron: 55 ug/dL (ref 45–182)
Saturation Ratios: 43 % — ABNORMAL HIGH (ref 17.9–39.5)
TIBC: 129 ug/dL — ABNORMAL LOW (ref 250–450)
UIBC: 74 ug/dL

## 2015-09-02 LAB — GLUCOSE, CAPILLARY
GLUCOSE-CAPILLARY: 219 mg/dL — AB (ref 65–99)
Glucose-Capillary: 197 mg/dL — ABNORMAL HIGH (ref 65–99)
Glucose-Capillary: 254 mg/dL — ABNORMAL HIGH (ref 65–99)
Glucose-Capillary: 218 mg/dL — ABNORMAL HIGH (ref 65–99)
Glucose-Capillary: 271 mg/dL — ABNORMAL HIGH (ref 65–99)
Glucose-Capillary: 247 mg/dL — ABNORMAL HIGH (ref 65–99)

## 2015-09-02 LAB — RETICULOCYTES
RBC.: 4.06 MIL/uL — ABNORMAL LOW (ref 4.22–5.81)
Retic Count, Absolute: 60.9 10*3/uL (ref 19.0–186.0)
Retic Ct Pct: 1.5 % (ref 0.4–3.1)

## 2015-09-02 LAB — FOLATE: FOLATE: 5.1 ng/mL — AB (ref >5.9)

## 2015-09-02 LAB — VITAMIN B12: Vitamin B-12: 562 pg/mL (ref 180–914)

## 2015-09-02 MED ORDER — INSULIN ASPART 100 UNIT/ML ~~LOC~~ SOLN
0.0000 [IU] | Freq: Three times a day (TID) | SUBCUTANEOUS | Status: DC
  Administered 2015-09-03 (×2): 2 [IU] via SUBCUTANEOUS
  Administered 2015-09-03: 3 [IU] via SUBCUTANEOUS

## 2015-09-02 MED ORDER — INSULIN GLARGINE 100 UNIT/ML ~~LOC~~ SOLN
30.0000 [IU] | Freq: Every day | SUBCUTANEOUS | Status: DC
  Administered 2015-09-02: 30 [IU] via SUBCUTANEOUS
  Filled 2015-09-02 (×3): qty 0.3

## 2015-09-02 MED ORDER — GLUCOSE BLOOD VI STRP: 1.0000 | ORAL_STRIP | Freq: Three times a day (TID) | Status: AC

## 2015-09-02 MED ORDER — ACCU-CHEK SOFTCLIX LANCETS MISC: 1.0000 | Freq: Three times a day (TID) | Status: AC

## 2015-09-02 MED ORDER — LOPERAMIDE HCL 2 MG PO CAPS
4.0000 mg | ORAL_CAPSULE | Freq: Once | ORAL | Status: AC
Start: 2015-09-02 — End: 2015-09-02
  Administered 2015-09-02: 4 mg via ORAL
  Filled 2015-09-02: qty 2

## 2015-09-02 MED ORDER — PREDNISONE 10 MG PO TABS
5.0000 mg | ORAL_TABLET | Freq: Every day | ORAL | Status: DC
  Administered 2015-09-03: 5 mg via ORAL
  Filled 2015-09-02: qty 1

## 2015-09-02 MED ORDER — ACCU-CHEK AVIVA PLUS W/DEVICE KIT: 1.0000 | PACK | Freq: Three times a day (TID) | Status: AC

## 2015-09-02 MED ORDER — FOLIC ACID 1 MG PO TABS
1.0000 mg | ORAL_TABLET | Freq: Every day | ORAL | Status: DC
  Administered 2015-09-02 – 2015-09-03 (×2): 1 mg via ORAL
  Filled 2015-09-02 (×2): qty 1

## 2015-09-02 NOTE — Care Management Note (Signed)
Case Management Note  Patient Details  Name: Trevor Thompson MRN: [id number] Date of Birth: 08-03-1960  Subjective/Objective:   Patient is from home, active with [facility] for [facility] and HHPT, will like to resume services.  Patient chose [facility] for [facility] oxygen.  Jermaine with [facility] brought oxygen up to patient's room.  Patient has L BKA and R foot amputation.  NCM notified [facility] that patient will be discharged on 3/1.  NCM will cont to follow for dc needs.                  Action/Plan:   Expected Discharge Date:                  Expected Discharge Plan:  Home w Home Health Services  In-House Referral:     Discharge planning Services  CM Consult  Post Acute Care Choice:  Durable Medical Equipment Choice offered to:  Patient  DME Arranged:  Oxygen DME Agency:  [facility]  HH Arranged:  RN, PT [facility] Agency:  [facility]  Status of Service:  Completed, signed off  Medicare Important Message Given:  Yes Date Medicare IM Given:    Medicare IM give by:    Date Additional Medicare IM Given:    Additional Medicare Important Message give by:     If discussed at Long Length of Stay Meetings, dates discussed:    Additional Comments:  Leone Haven, RN 09/02/2015, 4:37 PM

## 2015-09-02 NOTE — Telephone Encounter (Signed)
Please inform patient that diabetes testing supplies sent to [facility] as [facility] pharmacy is not able to bill medicare part D.  If [facility] is also not able to bill medicare part D, please let us know and we will send it to a major chain pharmacy of his choice (eg. [facility], [facility], [facility], [facility]).

## 2015-09-02 NOTE — Progress Notes (Signed)
Pharmacy Antibiotic Note  Trevor Thompson is a 55 y.o. male admitted on 08/30/2015 with pneumonia.  Pharmacy has been consulted for vanc/cefepime dosing. 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 SCr 3.1 on admit (BL ~2), current SCr 2.8, normalized CrCl~30  Plan: - Continue Vanc  IV q48h - Continue cefepime 2g IV q24h - Monitor clinical progress, c/s, renal function, abx plan/LOT - VT@SS  as indicated - Monitor renal function trend closely  Height:  (180.3 cm) Weight: 217 lb 9.5 oz (98.7 kg) IBW/kg (Calculated) : 75.3  Temp (24hrs), Avg:97.9 F (36.6 C), Min:97.7 F (36.5 C), Max:98.4 F (36.9 C)   Recent Labs Lab 08/30/15 2010 08/30/15 2117 08/30/15 2126 08/31/15 0013 08/31/15 0400 08/31/15 0409 08/31/15 1334 09/01/15 0442 09/02/15 0518  WBC 5.8  --   --   --  8.1  --   --  3.3* 3.6*  CREATININE 2.99*  --  3.10*  --  2.88*  --   --  3.10* 2.80*  LATICACIDVEN  --  2.09*  --  0.75  --  1.1 1.1  --   --     Estimated Creatinine Clearance: 35.7 mL/min (by C-G formula based on Cr of 2.8).    Allergies  Allergen Reactions  . Penicillins Other (See Comments)    Has patient had a PCN reaction causing immediate rash, facial/tongue/throat swelling, SOB or lightheadedness with hypotension: No Has patient had a PCN reaction causing severe rash involving mucus membranes or skin necrosis: No Has patient had a PCN reaction that required hospitalization. No Has patient had a PCN reaction occurring within the last 10 years: No If all of the above answers are "NO", then may proceed with Cephalosporin use.     Antimicrobials this admission: Vanc 2/25>> Cefepime 2/25>> Azithromycin 2/26>>   Dose adjustments this admission: n/a  Microbiology results: 2/25 BCx2>> ngtd 2/26 BCx2>> ngtd 2/26 UCx>> 1,000 COLONIES/mL INSIGNIFICANT GROWTH 2/26 resp virus panel negative, GI panel negative, CDiff negative      Thank you for allowing [place] to participate in this patients care. Signe Colt, PharmD Pager: [phone] 09/02/2015 1:07 PM

## 2015-09-02 NOTE — Progress Notes (Addendum)
Observed/recorded Pt desats below 88% on room air at rest, meets requirement for home oxygen; see DR Roda Shutters note re diagnosis.

## 2015-09-02 NOTE — Progress Notes (Signed)
Cardiologist: Dr. Elberta Fortis (new consult 08/31/15)  Subjective: No chest pain, no shortness of breath. Laying flat in bed.     Objective:  Vital Signs in the last 24 hours: Temp:  [97.6 F (36.4 C)-98.4 F (36.9 C)] 97.8 F (36.6 C) (02/28 0357) Pulse Rate:  [85-90] 90 (02/28 0357) Resp:  [15-21] 17 (02/28 0357) BP: (144-161)/(90-96) 144/90 mmHg (02/28 0357) SpO2:  [92 %-100 %] 100 % (02/28 0357)  Intake/Output from previous day: 02/27 0701 - 02/28 0700 In: 2020 [P.O.:120; I.V.:1100; IV Piggyback:800] Out: -    Physical Exam: General: Well developed, well nourished, in no acute distress. Head:  Normocephalic and atraumatic. Lungs: Clear to auscultation and percussion. Heart: Normal S1 and S2.  2/6 SEM RUSB,no rubs or gallops.  Abdomen: soft, non-tender, positive bowel sounds. Extremities: No clubbing or cyanosis. No edema. Post amputation Neurologic: Alert and oriented x 3. Normal affect    Lab Results:  Recent Labs  09/01/15 0442 09/02/15 0518  WBC 3.3* 3.6*  HGB 9.9* 10.0*  PLT 126* 132*    Recent Labs  09/01/15 0442 09/02/15 0518  NA 139 139  K 4.6 4.4  CL 112* 113*  CO2 16* 18*  GLUCOSE 218* 289*  BUN 41* 43*  CREATININE 3.10* 2.80*    Recent Labs  08/31/15 1328 08/31/15 1603  TROPONINI 0.37* 0.35*   Hepatic Function Panel  Recent Labs  08/31/15 0400  PROT 5.3*  ALBUMIN 1.7*  AST 24  ALT 12*  ALKPHOS 45  BILITOT 0.8   Telemetry: No evidence of significant bradycardia on telemetry Personally viewed.   EKG:  08/31/15-sinus tachycardia rate 106 with no other significant abnormalities. Personally viewed.  Cardiac Studies:  ECHO 09/01/15: - Left ventricle: The cavity size was normal. There was mild concentric hypertrophy. Systolic function was mildly reduced. The estimated ejection fraction was in the range of 45% to 50%. Mild diffuse hypokinesis with no identifiable regional variations. Doppler parameters are consistent with  abnormal left ventricular relaxation (grade 1 diastolic dysfunction). - Aortic valve: Noncoronary cusp mobility was severely restricted. There was mild stenosis. There was mild to moderate regurgitationdirected eccentrically in the LVOT and towards the mitralanterior leaflet. Valve area (VTI): 2.17 cm^2. Valve area (Vmax):1.98 cm^2. Valve area (Vmean): 1.78 cm^2. - Left atrium: The atrium was mildly dilated. - Pericardium, extracardiac: A small pericardial effusion was identified circumferential to the heart. There was no evidence of hemodynamic compromise.  Meds: Scheduled Meds: . atorvastatin  20 mg Oral q1800  . azithromycin  500 mg Intravenous Q24H  . calcitRIOL  0.5 mcg Oral Daily  . ceFEPime (MAXIPIME) IV  2 g Intravenous Q24H  . enoxaparin (LOVENOX) injection  30 mg Subcutaneous Q24H  . folic acid  1 mg Oral Daily  . gabapentin  300 mg Oral QHS  . insulin aspart  0-9 Units Subcutaneous 6 times per day  . insulin glargine  20 Units Subcutaneous Q2200  . methylPREDNISolone (SOLU-MEDROL) injection  20 mg Intravenous Q12H  . mycophenolate  720 mg Oral BID  . pneumococcal 23 valent vaccine  0.5 mL Intramuscular Tomorrow-1000  . sodium bicarbonate  650 mg Oral BID  . tacrolimus  4 mg Oral BID  . vancomycin  1,500 mg Intravenous Q48H   Continuous Infusions: . sodium chloride 1,000 mL (09/01/15 1556)   PRN Meds:.  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 No ACE inhibitor because of renal dysfunction. Was on this previously but may have caused low BP he states.  -No further cardiac workup at this  time. Recent NUC stress 06/2015 was normal, low risk per patient  -Asymptomatic -May be mildly reduced in the setting of comorbidities/acute illness  -Would try to avoid invasive strategy with IV contrast given his cadaveric renal transplant and elevated creatinine.  Elevated troponin/demand ischemia -Minimally elevated, flat, no ischemia on EKG, no chest pain -Continue with supportive care, antibiotics, treatment of pneumonia -Minimally reduced ejection fraction noted 45-50%. -As is stated above, may consider outpatient nuclear stress testing for further risk stratification once he is over this illness. -He states that 06/2015 had NUC stress test in [place] ([facility]) and it was normal.   Mild aortic stenosis -Should be of no clinical consequence currently.  Mild aortic regurgitation -Should be of no clinical consequence  PVD -Status post amputation BKA  Renal transplant -Per nephrology  Recent sepsis secondary to hospital-acquired pneumonia -Antibiotics per primary team  We will sign off, please call with any questions.  Russell Engelstad 09/02/2015, 8:16 AM

## 2015-09-02 NOTE — Progress Notes (Addendum)
PROGRESS NOTE  Trevor Thompson ZOX:[id number] DOB: Jun 07, 1961 DOA: 08/30/2015 PCP: Lora Paula, MD  HPI/Recap of past 24 hours:  Much happier today, start to make eye contact during conversation Still have cough, but much less, no chest pain Reported chronic diarrhea, no n/v. Tolerating diet. Still has nocturnal hypoxia   Assessment/Plan: Active Problems:   Diabetes type 2, controlled (HCC)   HTN (hypertension)   S/p cadaver renal transplant   S/P BKA (below knee amputation) (HCC)   Protein-calorie malnutrition, severe   Sepsis (HCC)   HCAP (healthcare-associated pneumonia)   AKI (acute kidney injury) (HCC)  Sepsis in a immunosuppressed patient (patient was discharge on doxycycline two weeks ago, reported still taking it) . Sepsis presented on admission, with sinus tachycardia, lactic acidosis, pna and hypoxia on admission, Culture unrevealing.  Started On vanc/cefepime/zithro/ivf from admission. Hold home meds lasix in the setting of sepsis better  Acute on chronic respiratory insufficiency wit hypoxia: likely secondary to pna, o2 88% on room air initial on presentation, on oxygen supplement, continue to have nocturnal hypoxia  HCAP: dose has cough, cxr on presentation "Evolving right basilar infiltrate medially". On vanc/cefepime/zithro. Respiratory viral pathogen pcr negative, flu negative.  Repeat cxr 2 view pending.  N/v/d:  cdiff negative, gi pathogen negative,, n/v has resolved, persistent diarrhea reported chronic, cdiff negative, gi pathogen panel negative, possible from myfortic.  Troponin elevation, denies chest pain, no acute ekg changes, continue cycle cardiac enzymes, echo with mild reduced EF, cardiology consulted  Insulin dependent diabetes, last a1c 6.7 on 08/13/2015, on lantus 20qhs, and ssi, elevated blood sugar here likely due to higher dose of steroids, will taper steroid to home dose.   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 Per ortho note from 2/27: "Staples and sutures were removed in their entirety today. The right foot was redressed with a 4 x 4 dressing and an Ace wrap. He may weight-bear as tolerated on the right putting weight on his heel wearing a postop shoe which the patient has at home. Suggest that he have someone bring it to the hospital for him. I will order dry dressing changes by nursing every 3 days. We will see him in the office in 2 weeks. Please call us if needed in the meantime. I are office number is [phone]."  ARF on CKD III , S/p renal transplant on prednisone /mycophenolate/tacrolimus,  tacrolimus level therapeutic,  Per nephrology baseline cr 2. Cr on admission 3.1 then 2.88 then 3.1 again, ua with a few bacteria, urine culture no significant growth, elevation of cr likely due to reported n/v/d and spesis.  Lasix held, still on ivf, started on sodium bicarb supplement for mild metabolic acidosis,  Close monitor cr, renal dosing meds.  s/p left BKA, has prosthesis device.  Osa, not able to tolerate cpap per patient, nocturnal hypoxia, care manager to arrange home o2.  Code Status: full  Family Communication: patient   Disposition Plan: home in 1-2 days,    Consultants:  Nephrology, signed off  cardiology  Procedures:  none  Antibiotics:  Vanc/cefepime/zithromax from admission  Rocephin x1 on admission.    Objective: BP 170/104 mmHg  Pulse 90  Temp(Src) 97.7 F (36.5 C) (Oral)  Resp 17  Ht  (1.803 m)  Wt 98.7 kg (217 lb 9.5 oz)  BMI 30.36 kg/m2  SpO2 100%  Intake/Output Summary (Last 24 hours) at 09/02/15  1527 Last data filed at 09/02/15 0900  Gross per 24 hour  Intake   2660 ml  Output    250 ml  Net   2410 ml   Filed Weights   08/30/15 2008 08/31/15 1551  Weight: 108.863 kg (240 lb) 98.7 kg (217 lb 9.5 oz)    Exam:   General:  NAD, pleasant  today  Cardiovascular: RRR  Respiratory: diminished, mild crackles at basis, but no wheezing, no rhonchi  Abdomen: Soft/ND/NT, positive BS  Musculoskeletal: No Edema, s/p left kba, s/p right transmetatarsal amputation dressing intact  Neuro: aaox3,   Data Reviewed: Basic Metabolic Panel:  Recent Labs Lab 08/30/15 2010 08/30/15 2126 08/31/15 0400 09/01/15 0442 09/02/15 0518  NA 140 140 144 139 139  K 4.6 4.5 4.8 4.6 4.4  CL 109 109 117* 112* 113*  CO2 17*  --  16* 16* 18*  GLUCOSE 103* 100* 117* 218* 289*  BUN 37* 42* 37* 41* 43*  CREATININE 2.99* 3.10* 2.88* 3.10* 2.80*  CALCIUM 8.5*  --  7.7* 7.9* 8.0*   Liver Function Tests:  Recent Labs Lab 08/30/15 2010 08/31/15 0400  AST 28 24  ALT 13* 12*  ALKPHOS 52 45  BILITOT 0.6 0.8  PROT 5.9* 5.3*  ALBUMIN 2.0* 1.7*    Recent Labs Lab 08/30/15 2010  LIPASE 36   No results for input(s): AMMONIA in the last 168 hours. CBC:  Recent Labs Lab 08/30/15 2010 08/30/15 2126 08/31/15 0400 09/01/15 0442 09/02/15 0518  WBC 5.8  --  8.1 3.3* 3.6*  NEUTROABS 3.0  --  7.3  --   --   HGB 11.1* 12.6* 10.2* 9.9* 10.0*  HCT 36.2* 37.0* 34.0* 32.8* 33.9*  MCV 84.6  --  85.4 83.2 83.5  PLT 122*  --  121* 126* 132*   Cardiac Enzymes:    Recent Labs Lab 08/31/15 0400 08/31/15 1328 08/31/15 1603  TROPONINI 0.45* 0.37* 0.35*   BNP (last 3 results) No results for input(s): BNP in the last 8760 hours.  ProBNP (last 3 results) No results for input(s): PROBNP in the last 8760 hours.  CBG:  Recent Labs Lab 09/01/15 2317 09/02/15 0401 09/02/15 0837 09/02/15 1145 09/02/15 1504  GLUCAP 197* 254* 218* 219* 271*    Recent Results (from the past 240 hour(s))  Blood Culture (routine x 2)     Status: None (Preliminary result)   Collection Time: 08/30/15  8:38 PM  Result Value Ref Range Status   Specimen Description BLOOD RIGHT HAND  Final   Special Requests BOTTLES DRAWN AEROBIC AND ANAEROBIC 5CC  Final    Culture NO GROWTH 3 DAYS  Final   Report Status PENDING  Incomplete  Urine culture     Status: None   Collection Time: 08/31/15 12:07 AM  Result Value Ref Range Status   Specimen Description URINE, CLEAN CATCH  Final   Special Requests NONE  Final   Culture 1,000 COLONIES/mL INSIGNIFICANT GROWTH  Final   Report Status 09/01/2015 FINAL  Final  Blood Culture (routine x 2)     Status: None (Preliminary result)   Collection Time: 08/31/15  1:28 PM  Result Value Ref Range Status   Specimen Description BLOOD RIGHT ANTECUBITAL  Final   Special Requests   Final    BOTTLES DRAWN AEROBIC AND ANAEROBIC 7CC POST ANTIBIOTICS   Culture NO GROWTH 2 DAYS  Final   Report Status PENDING  Incomplete  Gastrointestinal Panel by PCR , Stool     Status:  None   Collection Time: 08/31/15  2:39 PM  Result Value Ref Range Status   Campylobacter species NOT DETECTED NOT DETECTED Final   Plesimonas shigelloides NOT DETECTED NOT DETECTED Final   Salmonella species NOT DETECTED NOT DETECTED Final   Yersinia enterocolitica NOT DETECTED NOT DETECTED Final   Vibrio species NOT DETECTED NOT DETECTED Final   Vibrio cholerae NOT DETECTED NOT DETECTED Final   Enteroaggregative E coli (EAEC) NOT DETECTED NOT DETECTED Final   Enteropathogenic E coli (EPEC) NOT DETECTED NOT DETECTED Final   Enterotoxigenic E coli (ETEC) NOT DETECTED NOT DETECTED Final   Shiga like toxin producing E coli (STEC) NOT DETECTED NOT DETECTED Final   E. coli O157 NOT DETECTED NOT DETECTED Final   Shigella/Enteroinvasive E coli (EIEC) NOT DETECTED NOT DETECTED Final   Cryptosporidium NOT DETECTED NOT DETECTED Final   Cyclospora cayetanensis NOT DETECTED NOT DETECTED Final   Entamoeba histolytica NOT DETECTED NOT DETECTED Final   Giardia lamblia NOT DETECTED NOT DETECTED Final   Adenovirus F40/41 NOT DETECTED NOT DETECTED Final   Astrovirus NOT DETECTED NOT DETECTED Final   Norovirus GI/GII NOT DETECTED NOT DETECTED Final   Rotavirus A NOT  DETECTED NOT DETECTED Final   Sapovirus (I, II, IV, and V) NOT DETECTED NOT DETECTED Final  C difficile quick scan w PCR reflex     Status: None   Collection Time: 08/31/15  2:39 PM  Result Value Ref Range Status   C Diff antigen NEGATIVE NEGATIVE Final   C Diff toxin NEGATIVE NEGATIVE Final   C Diff interpretation Negative for toxigenic C. difficile  Final  Respiratory virus panel     Status: None   Collection Time: 08/31/15  4:30 PM  Result Value Ref Range Status   Respiratory Syncytial Virus A Negative Negative Final   Respiratory Syncytial Virus B Negative Negative Final   Influenza A Negative Negative Final   Influenza B Negative Negative Final   Parainfluenza 1 Negative Negative Final   Parainfluenza 2 Negative Negative Final   Parainfluenza 3 Negative Negative Final   Metapneumovirus Negative Negative Final   Rhinovirus Negative Negative Final   Adenovirus Negative Negative Final    Comment: (NOTE) Performed At: [facility] [address] [place], [place] [id number] Mila Homer MD WU:[id number]      Studies: No results found.  Scheduled Meds: . atorvastatin  20 mg Oral q1800  . azithromycin  500 mg Intravenous Q24H  . calcitRIOL  0.5 mcg Oral Daily  . ceFEPime (MAXIPIME) IV  2 g Intravenous Q24H  . enoxaparin (LOVENOX) injection  30 mg Subcutaneous Q24H  . folic acid  1 mg Oral Daily  . gabapentin  300 mg Oral QHS  . insulin aspart  0-9 Units Subcutaneous 6 times per day  . insulin glargine  20 Units Subcutaneous Q2200  . methylPREDNISolone (SOLU-MEDROL) injection  20 mg Intravenous Q12H  . mycophenolate  720 mg Oral BID  . pneumococcal 23 valent vaccine  0.5 mL Intramuscular Tomorrow-1000  . sodium bicarbonate  650 mg Oral BID  . tacrolimus  4 mg Oral BID  . vancomycin  1,500 mg Intravenous Q48H    Continuous Infusions: . sodium chloride 1,000 mL (09/01/15 1556)     Time spent:  Shatima Zalar MD, PhD  [facility] Pager [phone].  If 7PM-7AM, please contact night-coverage at [email or web address], password [facility] 09/02/2015, 3:27 PM  LOS: 3 days

## 2015-09-02 NOTE — Progress Notes (Signed)
Inpatient Diabetes Program Recommendations  AACE/ADA: New Consensus Statement on Inpatient Glycemic Control (2015)  Target Ranges:  Prepandial:   less than 140 mg/dL      Peak postprandial:   less than 180 mg/dL (1-2 hours)      Critically ill patients:  140 - 180 mg/dL   Review of Glycemic Control  Results for ADDIEL, MCCARDLE (MRN [id number]) as of 09/02/2015 16:16  Ref. Range 09/02/2015 04:01 09/02/2015 08:37 09/02/2015 11:45 09/02/2015 15:04  Glucose-Capillary Latest Ref Range: 65-99 mg/dL 782 (H) 956 (H) 213 (H) 271 (H)   Needs insulin adjustment.  Inpatient Diabetes Program Recommendations:    Increase Lantus to 30 units QHS.  Will follow. Thank you. Ailene Ards, RD, LDN, CDE Inpatient Diabetes Coordinator [phone]

## 2015-09-03 ENCOUNTER — Inpatient Hospital Stay (HOSPITAL_COMMUNITY): Payer: Medicare Other

## 2015-09-03 LAB — CBC
HCT: 33.2 % — ABNORMAL LOW (ref 39.0–52.0)
Hemoglobin: 9.9 g/dL — ABNORMAL LOW (ref 13.0–17.0)
MCH: 24.8 pg — ABNORMAL LOW (ref 26.0–34.0)
MCHC: 29.8 g/dL — ABNORMAL LOW (ref 30.0–36.0)
MCV: 83 fL (ref 78.0–100.0)
PLATELETS: 131 10*3/uL — AB (ref 150–400)
RBC: 4 MIL/uL — ABNORMAL LOW (ref 4.22–5.81)
RDW: 17.1 % — AB (ref 11.5–15.5)
WBC: 6.8 10*3/uL (ref 4.0–10.5)

## 2015-09-03 LAB — BASIC METABOLIC PANEL
ANION GAP: 8 (ref 5–15)
BUN: 44 mg/dL — ABNORMAL HIGH (ref 6–20)
CALCIUM: 7.8 mg/dL — AB (ref 8.9–10.3)
CO2: 14 mmol/L — ABNORMAL LOW (ref 22–32)
Chloride: 118 mmol/L — ABNORMAL HIGH (ref 101–111)
Creatinine, Ser: 2.67 mg/dL — ABNORMAL HIGH (ref 0.61–1.24)
GFR, EST AFRICAN AMERICAN: 29 mL/min — AB (ref >60)
GFR, EST NON AFRICAN AMERICAN: 25 mL/min — AB (ref >60)
Glucose, Bld: 242 mg/dL — ABNORMAL HIGH (ref 65–99)
POTASSIUM: 4.1 mmol/L (ref 3.5–5.1)
Sodium: 140 mmol/L (ref 135–145)

## 2015-09-03 LAB — GLUCOSE, CAPILLARY
GLUCOSE-CAPILLARY: 218 mg/dL — AB (ref 65–99)
GLUCOSE-CAPILLARY: 235 mg/dL — AB (ref 65–99)
GLUCOSE-CAPILLARY: 169 mg/dL — AB (ref 65–99)
Glucose-Capillary: 210 mg/dL — ABNORMAL HIGH (ref 65–99)
Glucose-Capillary: 195 mg/dL — ABNORMAL HIGH (ref 65–99)

## 2015-09-03 MED ORDER — METOPROLOL SUCCINATE ER 25 MG PO TB24: 25.0000 mg | ORAL_TABLET | Freq: Every day | ORAL | Status: DC

## 2015-09-03 MED ORDER — METOPROLOL SUCCINATE ER 25 MG PO TB24: 25.0000 mg | ORAL_TABLET | Freq: Every day | ORAL | Status: AC

## 2015-09-03 MED ORDER — INSULIN GLARGINE 100 UNIT/ML ~~LOC~~ SOLN
50.0000 [IU] | Freq: Every day | SUBCUTANEOUS | Status: DC
  Filled 2015-09-03: qty 0.5

## 2015-09-03 MED ORDER — INSULIN GLARGINE 100 UNIT/ML SOLOSTAR PEN: 50.0000 [IU] | PEN_INJECTOR | Freq: Every day | SUBCUTANEOUS | Status: AC

## 2015-09-03 MED ORDER — ENOXAPARIN SODIUM 40 MG/0.4ML ~~LOC~~ SOLN: 40.0000 mg | SUBCUTANEOUS | Status: DC

## 2015-09-03 MED ORDER — INSULIN PEN NEEDLE 29G X 10MM MISC: 1.0000 | Freq: Every day | Status: AC

## 2015-09-03 NOTE — Evaluation (Signed)
Physical Therapy Evaluation Patient Details Name: Trevor Thompson MRN: [id number] DOB: April 22, 1961 Today's Date: 09/03/2015   History of Present Illness  55 y/o with pneumonia has a history of renal transplantation, peripheral vascular disease, hypertension, diabetes mellitus type 2, and left BKA presented with worsening diabetic foot infection. The patient recently had amputation of his right second toe in [place] in December 2016. He stated that the wound never healed, and he had been going to the wound care center on a daily basis.  Underwent R foot transmet amputation and closed 2/6  Clinical Impression  Pt presenting today slightly below his baseline level of Mod I. Pt remains weight bearing through R heel of RLE due to recent amputation on 2/6. Pt has been ambulating at home with use of RW and displayed proper use of AD requiring close supervision and displayed one slight LOB navigating the stairwell. Pt w/o complaints of SOB with activity on RA. Will continue to follow pt acutely for second stair trial however recommending HHPT and supervision for mobility once home to maximize recovery towards safe independent mobility.     Follow Up Recommendations Home health PT;Supervision for mobility/OOB    Equipment Recommendations  None recommended by PT    Recommendations for Other Services       Precautions / Restrictions Precautions Precautions: Fall Required Braces or Orthoses: Other Brace/Splint Other Brace/Splint: post-op shoe R/ L foot prosthesis Restrictions Weight Bearing Restrictions: Yes Other Position/Activity Restrictions: weight bearing through R heel      Mobility  Bed Mobility Overal bed mobility: Independent             General bed mobility comments: achieves EOB without cueing and dons prosthesis  Transfers Overall transfer level: Needs assistance Equipment used: Rolling walker (2 wheeled) Transfers: Sit to/from Stand Sit to Stand: Modified independent  (Device/Increase time);Supervision         General transfer comment: supervision fro safety as pt doesnt always display proper safety awareness  Ambulation/Gait Ambulation/Gait assistance: Supervision Ambulation Distance (Feet): 175 Feet Assistive device: Rolling walker (2 wheeled) Gait Pattern/deviations: Step-through pattern;Decreased stride length Gait velocity: decreased Gait velocity interpretation: Below normal speed for age/gender General Gait Details: pt displays proper sequencing, one LOB at the top of the stairs requiring close guarding as pt regained balance with RW, VCs for heel only weight bearing  Stairs Stairs: Yes Stairs assistance: Min guard Stair Management: Two rails;Step to pattern;Forwards Number of Stairs: 5 General stair comments: Pt completed stairs easily with two rails however maximally challenged to complete with one rail and grabbed for 2nd rail at the last second, limited with proper sequencing ascending stairs due to weight of L prosthesis  Wheelchair Mobility    Modified Rankin (Stroke Patients Only)       Balance Overall balance assessment: Needs assistance Sitting-balance support: Feet unsupported;No upper extremity supported Sitting balance-Leahy Scale: Good Sitting balance - Comments: donned prosthesis sitting EOB   Standing balance support: Bilateral upper extremity supported;During functional activity Standing balance-Leahy Scale: Fair                               Pertinent Vitals/Pain Pain Assessment: No/denies pain    Home Living Family/patient expects to be discharged to:: Private residence Living Arrangements: Children;Other relatives Available Help at Discharge: Family;Available 24 hours/day Type of Home: House Home Access: Stairs to enter Entrance Stairs-Rails: None Entrance Stairs-Number of Steps: 2 Home Layout: Two level Home Equipment: Cane -  single point;Shower seat;Walker - 2 wheels      Prior  Function Level of Independence: Independent with assistive device(s)         Comments: uses cane and RW intermittently, daughter does do the grocery shopping, pt does limited activtiy during the day due to L prosthesis being heavy     Hand Dominance   Dominant Hand: Right    Extremity/Trunk Assessment   Upper Extremity Assessment: Overall WFL for tasks assessed           Lower Extremity Assessment: Generalized weakness;RLE deficits/detail RLE Deficits / Details: weight bearing through heel due to surgery    Cervical / Trunk Assessment: Normal  Communication   Communication: No difficulties  Cognition Arousal/Alertness: Awake/alert Behavior During Therapy: WFL for tasks assessed/performed Overall Cognitive Status: Within Functional Limits for tasks assessed                      General Comments General comments (skin integrity, edema, etc.): Swelling noted in R LE after activtiy, no pain, educated on ankle pumps while seated, no increase in SOB with activtiy on RA    Exercises        Assessment/Plan    PT Assessment Patient needs continued PT services  PT Diagnosis Difficulty walking   PT Problem List Decreased strength;Decreased range of motion;Decreased activity tolerance;Decreased balance;Decreased mobility;Decreased coordination;Decreased safety awareness  PT Treatment Interventions DME instruction;Gait training;Stair training;Functional mobility training;Therapeutic activities;Therapeutic exercise;Balance training   PT Goals (Current goals can be found in the Care Plan section) Acute Rehab PT Goals Patient Stated Goal: go home PT Goal Formulation: With patient Time For Goal Achievement: 09-28-15 Potential to Achieve Goals: Good    Frequency Min 2X/week   Barriers to discharge Inaccessible home environment 2 steps at entry have no handrail    Co-evaluation               End of Session Equipment Utilized During Treatment: Gait  belt Activity Tolerance: Patient limited by fatigue Patient left: in chair;with call bell/phone within reach Nurse Communication: Mobility status         Time: 1610-9604 PT Time Calculation (min) (ACUTE ONLY): 24 min   Charges:   PT Evaluation $PT Eval Low Complexity: 1 Procedure PT Treatments $Gait Training: 8-22 mins   PT G Codes:        Ulyses Jarred 09-14-2015, 11:11 AM  Ulyses Jarred, Student Physical Therapist Acute Rehab [phone]

## 2015-09-03 NOTE — Progress Notes (Signed)
IVs d/c'd. Tip intact. Pt tolerated well. D/C instructions given. No questions or concerns at this time. Pt d/c'd via family vehicle.

## 2015-09-03 NOTE — Discharge Instructions (Signed)
Acute Kidney Injury °Acute kidney injury is any condition in which there is sudden (acute) damage to the kidneys. Acute kidney injury was previously known as acute kidney failure or acute renal failure. The kidneys are two organs that lie on either side of the spine between the middle of the back and the front of the abdomen. The kidneys: °· Remove wastes and extra water from the blood.   °· Produce important hormones. These help keep bones strong, regulate blood pressure, and help create red blood cells.   °· Balance the fluids and chemicals in the blood and tissues. °A small amount of kidney damage may not cause problems, but a large amount of damage may make it difficult or impossible for the kidneys to work the way they should. Acute kidney injury may develop into long-lasting (chronic) kidney disease. It may also develop into a life-threatening disease called end-stage kidney disease. Acute kidney injury can get worse very quickly, so it should be treated right away. Early treatment may prevent other kidney diseases from developing. °CAUSES  °· A problem with blood flow to the kidneys. This may be caused by:   °¨ Blood loss.   °¨ Heart disease.   °¨ Severe burns.   °¨ Liver disease. °· Direct damage to the kidneys. This may be caused by: °¨ Some medicines.   °¨ A kidney infection.   °¨ Poisoning or consuming toxic substances.   °¨ A surgical wound.   °¨ A blow to the kidney area.   °· A problem with urine flow. This may be caused by:   °¨ Cancer.   °¨ Kidney stones.   °¨ An enlarged prostate. °SIGNS AND SYMPTOMS  °· Swelling (edema) of the legs, ankles, or feet.   °· Tiredness (lethargy).   °· Nausea or vomiting.   °· Confusion.   °· Problems with urination, such as:   °¨ Painful or burning feeling during urination.   °¨ Decreased urine production.   °¨ Frequent accidents in children who are potty trained.   °¨ Bloody urine.   °· Muscle twitches and cramps.   °· Shortness of breath.   °· Seizures.   °· Chest  pain or pressure. °Sometimes, no symptoms are present.  °DIAGNOSIS °Acute kidney injury may be detected and diagnosed by tests, including blood, urine, imaging, or kidney biopsy tests.  °TREATMENT °Treatment of acute kidney injury varies depending on the cause and severity of the kidney damage. In mild cases, no treatment may be needed. The kidneys may heal on their own. If acute kidney injury is more severe, your health care provider will treat the cause of the kidney damage, help the kidneys heal, and prevent complications from occurring. Severe cases may require a procedure to remove toxic wastes from the body (dialysis) or surgery to repair kidney damage. Surgery may involve:  °· Repair of a torn kidney.   °· Removal of an obstruction. °HOME CARE INSTRUCTIONS °· Follow your prescribed diet. °· Take medicines only as directed by your health care provider.  °· Do not take any new medicines (prescription, over-the-counter, or nutritional supplements) unless approved by your health care provider. Many medicines can worsen your kidney damage or may need to have the dose adjusted.   °· Keep all follow-up visits as directed by your health care provider. This is important. °· Observe your condition to make sure you are healing as expected. °SEEK IMMEDIATE MEDICAL CARE IF: °· You are feeling ill or have severe pain in the back or side.   °· Your symptoms return or you have new symptoms. °· You have any symptoms of end-stage kidney disease. These include:   °¨ Persistent itchiness.   °¨   Loss of appetite.   °¨ Headaches.   °¨ Abnormally dark or light skin. °¨ Numbness in the hands or feet.   °¨ Easy bruising.   °¨ Frequent hiccups.   °¨ Menstruation stops.   °· You have a fever. °· You have increased urine production. °· You have pain or bleeding when urinating. °MAKE SURE YOU:  °· Understand these instructions. °· Will watch your condition. °· Will get help right away if you are not doing well or get worse. °  °This  information is not intended to replace advice given to you by your health care provider. Make sure you discuss any questions you have with your health care provider. °  °Document Released: 01/04/2011 Document Revised: 07/12/2014 Document Reviewed: 02/18/2012 °Elsevier Interactive Patient Education ©2016 [employer] ° °

## 2015-09-03 NOTE — Discharge Summary (Signed)
DISCHARGE SUMMARY  Trevor Thompson  MR#: [id number]  DOB:11-Feb-1961  Date of Admission: 08/30/2015 Date of Discharge: 09/03/2015  Attending Physician:, T  Patient's WPY:[email or web address], Lennox Laity, MD  Consults: Ortho  Cardiology Nephrology   Disposition: D/C home   Follow-up Appts:     Follow-up Information    Follow up with GRAVES,JOHN L, MD. Schedule an appointment as soon as possible for a visit in 2 weeks.   Specialty:  Orthopedic Surgery   Contact information:   [place] [address] [phone]       Follow up with [facility].   Why:  [facility] HHRN/ HHPT   Contact information:   [place] [address] [phone]       Follow up with [facility] On 09/09/2015.   Why:  [facility] on 09/09/15 at 11:00 am with Dr. Jarold Song.   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 male w/ a history of Hypertension; PVD; S/p cadaver renal transplant; GERD; DM; Sleep apnea; High cholesterol; and ESRD who presented w/ Nausea vomiting diarrhea and chills for 4 days with poor by mouth intake. EMS noted pt was hypoxic at 80% on room air. EMS noted that his heart rate was 116 blood pressure 126/76.   IN ER: White blood cell was 5.8 - creatinine 3.1 up from baseline of 2.5 - lactic  acid 2.09 - Chest x-ray suggested a right basilar infiltrate.  Hospital Course:  Sepsis of unclear etiology v/s SIRS Appears to have most likely been SIRS due to signif DH - no clear source of infection able to be confirmed   Acute on chronic respiratory failure with hypoxia Resolved w/ hydration and increased mobility - likely due to atelectasis   ?HCAP Respiratory viral pathogen pcr negative, flu negative - CXR noted RLL infiltrate at admit, but his was gone in f/u 3/1 (?due to atx) - has completed 4 full days of abx tx - was never febrile and never had elevated WBC (but is on immune supressants) - doubt he had a true pulmonary infection   N/V w/ diarrhea  cdiff negative, gi pathogen negative - n/v has resolved - diarrhea reportedly chronic - suspect his sx may have been related to a course of doxycycline he completed   ARF on CKD III , S/p renal transplant  -on prednisone /mycophenolate/tacrolimus, tacrolimus level therapeutic -per Nephrology baseline cr 2 - Cr on admission 3.1 -acute worsening felt to be due to [facility] primarily - crt on a downward trend at the time of d/c  -ua with a few bacteria, urine culture no significant growth  Troponin elevation no acute ekg changes - Cardiology did not feel further investigation warranted   Mild systolic CHF w/o acute exacerbation - ejection fraction 45-50% -add Toprol 79m QD per Cards suggestion  Mild AoS Not clinically significant at  this time   DM a1c 6.7 on 08/13/2015 - CBG control poor while in hospital - resume usual home insulin dosing upon d/c   S/p transmetatarsal amputation 08/08/15 delayed primary wound closure on 2/6 - Ortho provided consult - outpatient follow up in two weeks - per Ortho note 2/27: Staples and sutures were removed in their entirety The right foot was redressed with a 4 x 4 dressing and an Ace wrap. He may weight-bear as tolerated on the right putting weight on his heel wearing a postop shoe which the patient  has at home. Suggest that he have someone bring it to the hospital for him. I will order dry dressing changes by nursing every 3 days. We will see him in the office in 2 weeks.   s/p left BKA  has prosthesis device  OSA not able to tolerate cpap per patient, nocturnal hypoxia, care manager to arrange home O2     Medication List    STOP taking these medications        ciprofloxacin 500 MG tablet  Commonly known as:  CIPRO     doxycycline 100 MG tablet  Commonly known as:  VIBRA-TABS     furosemide 40 MG tablet  Commonly known as:  LASIX      TAKE these medications        ACCU-CHEK AVIVA PLUS w/Device Kit  1 Device by Does not apply route 3 (three) times daily after meals.     ACCU-CHEK SOFTCLIX LANCETS lancets  1 each by Other route 3 (three) times daily.     atorvastatin 20 MG tablet  Commonly known as:  LIPITOR  Take 1 tablet (20 mg total) by mouth daily at 6 PM.     calcitRIOL 0.5 MCG capsule  Commonly known as:  ROCALTROL  Take 0.5 mcg by mouth daily.     gabapentin 600 MG tablet  Commonly known as:  NEURONTIN  Take 0.5 tablets (300 mg total) by mouth at bedtime.     glucose blood test strip  Commonly known as:  ACCU-CHEK AVIVA PLUS  1 each by Other route 3 (three) times daily. E11.9     Insulin Glargine 100 UNIT/ML Solostar Pen  Commonly known as:  LANTUS SOLOSTAR  Inject 50 Units into the skin daily at 10 pm.     metoprolol succinate 25 MG 24 hr tablet  Commonly known as:  TOPROL-XL  Take 1 tablet (25 mg total) by mouth daily.  Start taking on:  09/04/2015     mycophenolate 360 MG Tbec EC tablet  Commonly known as:  MYFORTIC  Take 720 mg by mouth 2 (two) times daily.     predniSONE 5 MG tablet  Commonly known as:  DELTASONE  Take 5 mg by mouth daily with breakfast.     sodium bicarbonate 650 MG tablet  Take 1 tablet (650 mg total) by mouth 2 (two) times daily.     tacrolimus 1 MG capsule  Commonly known as:  PROGRAF  Take 4 mg by mouth 2 (two)  times daily.       Day of Discharge BP 160/88 mmHg  Pulse 84  Temp(Src) 97.7 F (36.5 C) (Oral)  Resp 10  Ht 5' 11" (1.803 m)  Wt 98.7 kg (217 lb 9.5 oz)  BMI 30.36 kg/m2  SpO2 97%  Physical Exam: General: No acute respiratory distress Lungs: Clear to auscultation bilaterally without wheezes or crackles Cardiovascular: Regular rate and rhythm without murmur gallop or rub  Abdomen:  Nontender, nondistended, soft, bowel sounds positive, no rebound Extremities: No significant edema   Basic Metabolic Panel:  Recent Labs Lab 08/30/15 2010 08/30/15 2126 08/31/15 0400 09/01/15 0442 09/02/15 0518 09/03/15 0500  NA 140 140 144 139 139 140  K 4.6 4.5 4.8 4.6 4.4 4.1  CL 109 109 117* 112* 113* 118*  CO2 17*  --  16* 16* 18* 14*  GLUCOSE 103* 100* 117* 218* 289* 242*  BUN 37* 42* 37* 41* 43* 44*  CREATININE 2.99* 3.10* 2.88* 3.10* 2.80* 2.67*  CALCIUM 8.5*  --  7.7* 7.9* 8.0* 7.8*    Liver Function Tests:  Recent Labs Lab 08/30/15 2010 08/31/15 0400  AST 28 24  ALT 13* 12*  ALKPHOS 52 45  BILITOT 0.6 0.8  PROT 5.9* 5.3*  ALBUMIN 2.0* 1.7*    Recent Labs Lab 08/30/15 2010  LIPASE 36    CBC:  Recent Labs Lab 08/30/15 2010 08/30/15 2126 08/31/15 0400 09/01/15 0442 09/02/15 0518 09/03/15 0500  WBC 5.8  --  8.1 3.3* 3.6* 6.8  NEUTROABS 3.0  --  7.3  --   --   --   HGB 11.1* 12.6* 10.2* 9.9* 10.0* 9.9*  HCT 36.2* 37.0* 34.0* 32.8* 33.9* 33.2*  MCV 84.6  --  85.4 83.2 83.5 83.0  PLT 122*  --  121* 126* 132* 131*    Cardiac Enzymes:  Recent Labs Lab 08/31/15 0400 08/31/15 1328 08/31/15 1603  TROPONINI 0.45* 0.37* 0.35*    CBG:  Recent Labs Lab 09/02/15 2322 09/03/15 0337 09/03/15 0712 09/03/15 1200 09/03/15 1641  GLUCAP 218* 235* 169* 210* 195*    Recent Results (from the past 240 hour(s))  Blood Culture (routine x 2)     Status: None (Preliminary result)   Collection Time: 08/30/15  8:38 PM  Result Value Ref Range Status   Specimen  Description BLOOD RIGHT HAND  Final   Special Requests BOTTLES DRAWN AEROBIC AND ANAEROBIC 5CC  Final   Culture NO GROWTH 4 DAYS  Final   Report Status PENDING  Incomplete  Urine culture     Status: None   Collection Time: 08/31/15 12:07 AM  Result Value Ref Range Status   Specimen Description URINE, CLEAN CATCH  Final   Special Requests NONE  Final   Culture 1,000 COLONIES/mL INSIGNIFICANT GROWTH  Final   Report Status 09/01/2015 FINAL  Final  Blood Culture (routine x 2)     Status: None (Preliminary result)   Collection Time: 08/31/15  1:28 PM  Result Value Ref Range Status   Specimen Description BLOOD RIGHT ANTECUBITAL  Final   Special Requests   Final    BOTTLES DRAWN AEROBIC AND ANAEROBIC [place] POST ANTIBIOTICS   Culture NO GROWTH 3 DAYS  Final   Report Status PENDING  Incomplete  Gastrointestinal Panel by PCR , Stool     Status: None   Collection Time: 08/31/15  2:39 PM  Result Value Ref Range Status   Campylobacter species NOT DETECTED NOT DETECTED Final   Plesimonas shigelloides NOT DETECTED NOT DETECTED Final   Salmonella species NOT DETECTED NOT DETECTED Final   Yersinia enterocolitica NOT DETECTED NOT DETECTED Final   Vibrio species NOT DETECTED NOT DETECTED Final   Vibrio cholerae NOT DETECTED NOT DETECTED Final   Enteroaggregative E coli (EAEC) NOT DETECTED NOT DETECTED Final   Enteropathogenic E coli (EPEC) NOT DETECTED NOT DETECTED Final   Enterotoxigenic E coli (ETEC) NOT DETECTED NOT DETECTED Final   Shiga like toxin producing E  coli (STEC) NOT DETECTED NOT DETECTED Final   E. coli O157 NOT DETECTED NOT DETECTED Final   Shigella/Enteroinvasive E coli (EIEC) NOT DETECTED NOT DETECTED Final   Cryptosporidium NOT DETECTED NOT DETECTED Final   Cyclospora cayetanensis NOT DETECTED NOT DETECTED Final   Entamoeba histolytica NOT DETECTED NOT DETECTED Final   Giardia lamblia NOT DETECTED NOT DETECTED Final   Adenovirus F40/41 NOT DETECTED NOT DETECTED Final   Astrovirus  NOT DETECTED NOT DETECTED Final   Norovirus GI/GII NOT DETECTED NOT DETECTED Final   Rotavirus A NOT DETECTED NOT DETECTED Final   Sapovirus (I, II, IV, and V) NOT DETECTED NOT DETECTED Final  C difficile quick scan w PCR reflex     Status: None   Collection Time: 08/31/15  2:39 PM  Result Value Ref Range Status   C Diff antigen NEGATIVE NEGATIVE Final   C Diff toxin NEGATIVE NEGATIVE Final   C Diff interpretation Negative for toxigenic C. difficile  Final  Respiratory virus panel     Status: None   Collection Time: 08/31/15  4:30 PM  Result Value Ref Range Status   Respiratory Syncytial Virus A Negative Negative Final   Respiratory Syncytial Virus B Negative Negative Final   Influenza A Negative Negative Final   Influenza B Negative Negative Final   Parainfluenza 1 Negative Negative Final   Parainfluenza 2 Negative Negative Final   Parainfluenza 3 Negative Negative Final   Metapneumovirus Negative Negative Final   Rhinovirus Negative Negative Final   Adenovirus Negative Negative Final    Comment: (NOTE) Performed At: [facility] [place], [place] [id number] Lindon Romp MD [id number]     Time spent in discharge (includes decision making & examination of pt): >35 minutes  09/03/2015, 5:07 PM   Cherene Altes, MD [facility] Office  [phone] Pager [phone]  On-Call/Text Page:      [email or web address]      password [facility]

## 2015-09-03 NOTE — Hospital Discharge Follow-Up (Signed)
Transitional Care Clinic Care Coordination Note:  Admit date:  08/30/15 Discharge date: 09/03/15 Discharge Disposition: Home with resumption of HH RN/PT services with [facility]. Patient contact: [phone] (mobile) Emergency contact(s): [phone] (Chabela-daughter)  This Case Manager reviewed patient's EMR and determined patient would benefit from post-discharge medical management and chronic care management services through the [facility]. Patient has a history of type 2 diabetes mellitus, HTN, s/p cadaver renal transplant, s/p BKA. Admitted for sepsis, acute on chronic respiratory insufficiency with hypoxia, HCAP. This Case Manager met with patient to discuss the services and medical management that can be provided at the [facility]. Patient verbalized understanding and agreed to receive post-discharge care at the [facility].   Patient scheduled for Transitional Care appointment on 09/09/15 at 1100 with Dr. Jarold Song.  Clinic information and appointment time provided to patient. Appointment information also placed on AVS.  Assessment:       Home Environment: Patient lives with his daughter, Ambrose Mantle, in a private residence.       Support System: Daughter, family members       Level of functioning: Patient uses a walker to ambulate. He has a prosthesis on LLE and is nonweightbearing on right-s/p right foot amputation. He indicates he is independent with activities of daily living.       Home DME: walker. Patient being discharged home on oxygen. Supplier: [facility]. Patient indicates he does not have a glucometer. Per chart review, glucometer and diabetes monitoring supplies were prescribed by Dr. Adrian Blackwater on 09/02/15. Patient updated. He is aware he will have to get glucometer and diabetes monitoring supplies from a retail pharmacy as [facility] does not bill Medicare Part B. Patient verbalized understanding, and he indicated he  would use [facility].       Home care services: Patient will resume [facility] RN/PT services with [facility] after discharge.      Transportation: Patient indicates his niece usually takes him to his medical appointments. Patient informed of transportation resources available with Transitional Care Clinic if needed. Patient verbalized understanding.        Food/Nutrition: Patient indicates he has access to needed food.        Medications: Patient indicates he typically gets his medications from [facility] on [address] or at [employer] [employer]. Patient reminded of pharmacy resources available at clinic.        Identified Barriers: Recently lost spouse (12/16)-may need to meet with Education officer, museum at [employer] and [employer]        PCP: Dr. Lenard G[facility] [place]             Arranged services:        Services communicated to Tomi Bamberger, RN CM

## 2015-09-04 ENCOUNTER — Telehealth: Payer: Self-pay

## 2015-09-04 LAB — CULTURE, BLOOD (ROUTINE X 2): Culture: NO GROWTH

## 2015-09-04 NOTE — Telephone Encounter (Signed)
Noted. Will see look out for medicare form.

## 2015-09-04 NOTE — Telephone Encounter (Signed)
Transitional Care Clinic Post-discharge Follow-Up Phone Call:  Date of Discharge: 09/03/15 Principal Discharge Diagnosis(es): sepsis of unclear etiology vs SIRS, acute on chronic respiratory failure with hypoxia. Post-discharge Communication: Attempt #1 to reach patient and complete discharge follow-up phone call. Call placed to #[phone]. Unable to reach patient; HIPPA compliant voicemail left requesting return call. In addition, call placed to #[phone]. Spoke with patient's daughter, Trevor Thompson, and HIPPA compliant message left with her requesting patient return call to this Case Manager. Awaiting return call. Call completed: No

## 2015-09-04 NOTE — Telephone Encounter (Signed)
Transitional Care Clinic Post-discharge Follow-Up Phone Call:  Date of Discharge: 09/03/15 Principal Discharge Diagnosis(es): Sepsis, acute on chronic respiratory failure with hypoxia Post-discharge Communication: Patient returned call to this Case Manager and discharge follow-up phone call completed. Call Completed: Yes                    With Whom: Patient Interpreter Needed: No    Please check all that apply:  X  Patient is knowledgeable of his/her condition(s) and/or treatment. X  Patient is caring for self at home.  ? Patient is receiving assist at home from family and/or caregiver. Family and/or caregiver is knowledgeable of patient's condition(s) and/or treatment. X  Patient is receiving home health services. Patient to resume [facility] RN/PT services with [facility]. Spoke with patient, and he indicated his home health services had not yet resumed. Gave patient [facility] contact information so he could call them and determine when his home health services will be resumed.    Medication Reconciliation:  X  Patient obtained all discharge medications-No. Patient indicated he gave his prescriptions for his discharge medications to his daughter. He indicated he planned to use [facility] on [address] for his new medications as well as for his glucometer and diabetes monitoring supplies. Discussed importance of his daughter picking up his discharge medications as soon as possible. Patient verbalized understanding. In addition, patient indicated he had the prescriptions for glucometer and diabetes monitoring supplies written by Dr. Armen Pickup on 09/02/15 transferred from [facility] to [facility] on [address]. Patient indicated he called to check if diabetes monitoring supplies were ready for pick-up, and he was told that Dr. Armen Pickup has to complete a Medicare form that [facility] sent before he can obtain diabetes monitoring supplies. This Case Manager called [facility] on [address], and pharmacy staff indicated Medicare form must be completed by Dr. Armen Pickup and sent back to them. They indicated fax was sent on 09/03/15. This Case Manager will notify Dr. Armen Pickup.   Activities of Daily Living:  X  Independent-Uses walker to ambulate. Has prothesis on LLE and per discharge summary can weight-bear as tolerated on RLE by putting weight on heel and wearing postop shoe. Patient was discharged home on home oxygen. Oxygen supplier is [facility]. ? Needs assist  ? [facility]    Community resources in place for patient:  ? None  X  Home Health-HH RN/PT services with [facility]. Services have not yet resumed. Provided patient with [facility] contact number so he can call and determine when home health services will resume. ? Assisted Living ? Support Group         Questions/Concerns discussed: This Case Manager inquired about patient's status. Patient had no complaints and denied shortness of breath. Reminded patient of his initial Transitional Care Clinic appointment on 09/09/15 at 1100 with Dr. Venetia Night. Patient indicated he thought he would have transportation but hesitant when answering question. Transitional Care Case Manager will need to call patient on 09/08/15 to determine if patient has transportation to his appointment. No additional needs/concerns identified.

## 2015-09-05 LAB — CULTURE, BLOOD (ROUTINE X 2): Culture: NO GROWTH

## 2015-09-05 NOTE — Telephone Encounter (Signed)
Medicare part B form for diabetes testing supplies received and faxed back to [facility]'s preferred [facility] Fax # [phone]

## 2015-09-08 ENCOUNTER — Telehealth: Payer: Self-pay

## 2015-09-08 ENCOUNTER — Encounter (HOSPITAL_COMMUNITY): Payer: Self-pay | Admitting: Family Medicine

## 2015-09-08 DIAGNOSIS — Z79899 Other long term (current) drug therapy: Secondary | ICD-10-CM | POA: Insufficient documentation

## 2015-09-08 DIAGNOSIS — Z7952 Long term (current) use of systemic steroids: Secondary | ICD-10-CM | POA: Insufficient documentation

## 2015-09-08 DIAGNOSIS — K219 Gastro-esophageal reflux disease without esophagitis: Secondary | ICD-10-CM | POA: Insufficient documentation

## 2015-09-08 DIAGNOSIS — Z880 Allergy status to penicillin: Secondary | ICD-10-CM | POA: Insufficient documentation

## 2015-09-08 DIAGNOSIS — Z8669 Personal history of other diseases of the nervous system and sense organs: Secondary | ICD-10-CM | POA: Insufficient documentation

## 2015-09-08 DIAGNOSIS — Z940 Kidney transplant status: Secondary | ICD-10-CM | POA: Diagnosis not present

## 2015-09-08 DIAGNOSIS — E781 Pure hyperglyceridemia: Secondary | ICD-10-CM | POA: Diagnosis not present

## 2015-09-08 DIAGNOSIS — E7800 Pure hypercholesterolemia, unspecified: Secondary | ICD-10-CM | POA: Insufficient documentation

## 2015-09-08 DIAGNOSIS — E119 Type 2 diabetes mellitus without complications: Secondary | ICD-10-CM | POA: Diagnosis not present

## 2015-09-08 DIAGNOSIS — T8132XA Disruption of internal operation (surgical) wound, not elsewhere classified, initial encounter: Secondary | ICD-10-CM | POA: Diagnosis not present

## 2015-09-08 DIAGNOSIS — Z794 Long term (current) use of insulin: Secondary | ICD-10-CM | POA: Insufficient documentation

## 2015-09-08 DIAGNOSIS — Z8701 Personal history of pneumonia (recurrent): Secondary | ICD-10-CM | POA: Insufficient documentation

## 2015-09-08 DIAGNOSIS — Y658 Other specified misadventures during surgical and medical care: Secondary | ICD-10-CM | POA: Insufficient documentation

## 2015-09-08 DIAGNOSIS — Z862 Personal history of diseases of the blood and blood-forming organs and certain disorders involving the immune mechanism: Secondary | ICD-10-CM | POA: Diagnosis not present

## 2015-09-08 DIAGNOSIS — Z87891 Personal history of nicotine dependence: Secondary | ICD-10-CM | POA: Diagnosis not present

## 2015-09-08 DIAGNOSIS — I120 Hypertensive chronic kidney disease with stage 5 chronic kidney disease or end stage renal disease: Secondary | ICD-10-CM | POA: Diagnosis not present

## 2015-09-08 DIAGNOSIS — N186 End stage renal disease: Secondary | ICD-10-CM | POA: Insufficient documentation

## 2015-09-08 DIAGNOSIS — Z4801 Encounter for change or removal of surgical wound dressing: Secondary | ICD-10-CM | POA: Diagnosis present

## 2015-09-08 NOTE — Telephone Encounter (Signed)
Call placed to the patient to check on his status and to remind him of his appointment at the [facility] Clinic ( [facility]) tomorrow.  He stated that the home care nurse has been out to see him and is coming back today. He noted that the nurse will be doing the dressing change on his foot. He said that he has not heard from the P.T.yet.  He said that he has all of his medications and has been taking them as prescribed. He also stated that the glucometer will be ready for pick up at the pharmacy today at 1600.  When asked about the use of his oxygen, he said that he has used it ; but  the machine that he has in the house is " too noisy" and he is not able to sleep with it.  Instructed him to discuss his concerns about the O2 with the nurse when she comes today and to also discuss with the doctor tomorrow and he stated that he would.  He confirmed that he will be at his [facility] appointment tomorrow, 09/09/15 @ 1100 and he has transportation to the clinic.  No other problems/questions reported.

## 2015-09-08 NOTE — ED Notes (Signed)
Pt here due to incision on right foot opening up. sts black stuff is coming out. Pt recent amputation to al toes. sts slight pain/

## 2015-09-09 ENCOUNTER — Emergency Department (HOSPITAL_COMMUNITY): Payer: Medicare Other

## 2015-09-09 ENCOUNTER — Emergency Department (HOSPITAL_COMMUNITY)
Admission: EM | Admit: 2015-09-09 | Discharge: 2015-09-09 | Disposition: A | Discharge status: Home / Self Care | Payer: Medicare Other | Attending: Emergency Medicine | Admitting: Emergency Medicine

## 2015-09-09 ENCOUNTER — Inpatient Hospital Stay: Payer: Medicare Other | Admitting: Family Medicine

## 2015-09-09 DIAGNOSIS — T8131XA Disruption of external operation (surgical) wound, not elsewhere classified, initial encounter: Secondary | ICD-10-CM

## 2015-09-09 NOTE — ED Notes (Signed)
Wet to dry dressing applied with clean technique. Covered with kerlix as ordered. Patient tolerated well. No distress noted.

## 2015-09-09 NOTE — Discharge Instructions (Signed)
Wound Dehiscence Trevor Thompson, Dr. Luiz [name] needs to see you in clinic tomorrow.  Please call first thing in the morning to book an appointment.  Keep the dressing on until then.  If any symptoms worsen, come back to the ED immediately. Thank you. Wound dehiscence is when a surgical cut (incision) opens up and does not heal properly. It usually happens 7-10 days after surgery. You may have bleeding from the cut. You may also have pain or a fever. This condition should be treated early. HOME CARE  Only take medicines as told by your doctor.  Take your antibiotic medicine as told. Finish it even if you start to feel better.  Wash your wound with warm, soapy water 2 times a day, or as told. Pat the wound dry. Do not rub the wound.  Change bandages (dressings) as often as told. Wash your hands before and after changing bandages. Apply bandages as told.  Take showers. Do not soak the wound, bathe, swim, or use a hot tub until directed by your doctor.  Avoid exercises that make you sweat.  Use medicines that stop itching as told by your doctor. The wound may itch as it heals. Do not pick or scratch at the wound.  Do not lift more than 10 pounds (4.5 kilograms) until the wound is healed, or as told by your doctor.  Keep all doctor visits as told. GET HELP IF:  You have a lot of bleeding from your surgical cut.  Your wound does not seem to be healing right.  You have a fever. GET HELP RIGHT AWAY IF:  You have more puffiness (swelling) or redness around the wound.  You have more pain in the wound.  You have yellowish white fluid (pus) coming from the wound.  More of the wound breaks open. MAKE SURE YOU:  Understand these instructions.  Will watch your condition.  Will get help right away if you are not doing well or get worse.   This information is not intended to replace advice given to you by your health care provider. Make sure you discuss any questions you have with your health  care provider.   Document Released: 06/09/2009 Document Revised: 07/12/2014 Document Reviewed: 02/26/2013 Elsevier Interactive Patient Education [employer]2016 Elsevier Inc.

## 2015-09-09 NOTE — ED Provider Notes (Signed)
CSN: [id number]     Arrival date & time 09/08/15  1805 History  By signing my name below, I, Stephania Fragmin, attest that this documentation has been prepared under the direction and in the presence of Everlene Balls, MD. Electronically Signed: Stephania Fragmin, ED Scribe. 09/09/2015. 2:43 AM.    Chief Complaint  Patient presents with  . Wound Check   The history is provided by the patient. No language interpreter was used.    HPI Comments: Trevor Thompson is a 55 y.o. male S/P right transmetatarsal amputation performed by Dr. Dorna Leitz on 08/08/2015, with a history of DM and renal transplant 7 years ago, who presents to the Emergency Department for wound dehiscence that he first noticed today. He states the incision appeared to open spontaneously today; he denies striking his foot on anything. He reports he had stitches removed 4-5 days ago, and the incision appeared to be healing well until today. Patient notes a history of pneumonia 6 days ago. He states he is not currently undergoing dialysis. He denies any fever or diaphoresis.   Past Medical History  Diagnosis Date  . Hypertension   . PVD (peripheral vascular disease) ([place])   . S/p cadaver renal transplant   . Cold intolerance   . Autonomic neuropathy due to diabetes ([place])   . GERD (gastroesophageal reflux disease)   . 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 Hypertriglyceridemia   . Diabetic retinopathy ([place])   . Sleep apnea     in the past .  Was cleared in 2004 to not wear CPap.  Dr in [place]  . High cholesterol   . Pneumonia 05/09/2013    "first time was today" (05/09/2013)  . Type II diabetes mellitus ([place])   . ESRD (end stage renal disease) ([place])     "no dialysis; had kidney transplant" (05/09/2013)   Past Surgical History  Procedure Laterality Date  . Kidney transplant Right 2004  . Cataract extraction w/ intraocular lens  implant, bilateral Bilateral ~ 1997  . Av fistula placement  1997-2004    "I've got 3; right upper arm; right  forearm; left forearm "  . Av fistula repair  1997-2004    "multiple"  . Amputation  02/25/2012    Procedure: AMPUTATION DIGIT;  Surgeon: Rosetta Posner, MD;  Location: [facility] OR;  Service: Vascular;  Laterality: Left;  Left 2nd Toe Amputation, Incision and Drainage left lateral foot.  . Amputation  03/22/2012    Procedure: AMPUTATION BELOW KNEE;  Surgeon: Newt Minion, MD;  Location: [place];  Service: Orthopedics;  Laterality: Left;  Left Below Knee Amputation  . Amputation  04/21/2012    Procedure: AMPUTATION BELOW KNEE;  Surgeon: Newt Minion, MD;  Location: [place];  Service: Orthopedics;  Laterality: Left;  Left below knee amputation revision  . Abdominal aortagram N/A 02/22/2012    Procedure: ABDOMINAL Maxcine Ham;  Surgeon: Serafina Mitchell, MD;  Location: [facility] CATH LAB;  Service: Cardiovascular;  Laterality: N/A;  . Amputation Right 08/08/2015    Procedure: TRANSMET AMPUTATION FOOT;  Surgeon: Dorna Leitz, MD;  Location: [place];  Service: Orthopedics;  Laterality: Right;  Right Transmetatarsal amputation  . I&d extremity Right 08/11/2015    Procedure: EXCISIONAL DEBRIDEMENT OF SKIN, TISSUE, BONE OF RIGHT METATARSAL AND WOUND CLOSURE OF RIGHT FOOT. ;  Surgeon: Dorna Leitz, MD;  Location: [place];  Service: Orthopedics;  Laterality: Right;   Family History  Problem Relation Age of Onset  . Diabetes Mother   .  Diabetes Father   . Lupus Sister    Social History  Substance Use Topics  . Smoking status: Former Smoker -- 0.30 packs/day for 3 years    Types: Cigarettes    Quit date: 07/05/1981  . Smokeless tobacco: Never Used  . Alcohol Use: 1.8 oz/week    3 Cans of beer per week     Comment: occasional    Review of Systems  A complete 10 system review of systems was obtained and all systems are negative except as noted in the HPI and PMH.    Allergies  Penicillins  Home Medications   Prior to Admission medications   Medication Sig Start Date End Date Taking? Authorizing Provider  ACCU-CHEK  SOFTCLIX LANCETS lancets 1 each by Other route 3 (three) times daily. 09/02/15   Josalyn Funches, MD  atorvastatin (LIPITOR) 20 MG tablet Take 1 tablet (20 mg total) by mouth daily at 6 PM. 08/14/15   Modena Jansky, MD  Blood Glucose Monitoring Suppl (ACCU-CHEK AVIVA PLUS) w/Device KIT 1 Device by Does not apply route 3 (three) times daily after meals. 09/02/15   Josalyn Funches, MD  calcitRIOL (ROCALTROL) 0.5 MCG capsule Take 0.5 mcg by mouth daily. 07/22/15   Historical Provider, MD  gabapentin (NEURONTIN) 600 MG tablet Take 0.5 tablets (300 mg total) by mouth at bedtime. 08/14/15   Modena Jansky, MD  glucose blood (ACCU-CHEK AVIVA PLUS) test strip 1 each by Other route 3 (three) times daily. E11.9 09/02/15   Boykin Nearing, MD  Insulin Glargine (LANTUS SOLOSTAR) 100 UNIT/ML Solostar Pen Inject 50 Units into the skin daily at 10 pm. 09/03/15   Cherene Altes, MD  Insulin Pen Needle 29G X 10MM MISC 1 Dose by Does not apply route daily. 09/03/15   Cherene Altes, MD  metoprolol succinate (TOPROL-XL) 25 MG 24 hr tablet Take 1 tablet (25 mg total) by mouth daily. 09/04/15   Cherene Altes, MD  mycophenolate (MYFORTIC) 360 MG TBEC EC tablet Take 720 mg by mouth 2 (two) times daily.     Historical Provider, MD  predniSONE (DELTASONE) 5 MG tablet Take 5 mg by mouth daily with breakfast.    Historical Provider, MD  sodium bicarbonate 650 MG tablet Take 1 tablet (650 mg total) by mouth 2 (two) times daily. 08/14/15   Modena Jansky, MD  tacrolimus (PROGRAF) 1 MG capsule Take 4 mg by mouth 2 (two) times daily.     Historical Provider, MD   BP 134/87 mmHg  Pulse 98  Temp(Src) 98 F (36.7 C) (Oral)  Resp 18  SpO2 95% Physical Exam  Constitutional: He is oriented to person, place, and time. Vital signs are normal. He appears well-developed and well-nourished.  Non-toxic appearance. He does not appear ill. No distress.  HENT:  Head: Normocephalic and atraumatic.  Nose: Nose normal.  Mouth/Throat:  Oropharynx is clear and moist. No oropharyngeal exudate.  Eyes: Conjunctivae and EOM are normal. Pupils are equal, round, and reactive to light. No scleral icterus.  Neck: Normal range of motion. Neck supple. No tracheal deviation, no edema, no erythema and normal range of motion present. No thyroid mass and no thyromegaly present.  Cardiovascular: Normal rate, regular rhythm, S1 normal, S2 normal, normal heart sounds, intact distal pulses and normal pulses.  Exam reveals no gallop and no friction rub.   No murmur heard. Pulmonary/Chest: Effort normal and breath sounds normal. No respiratory distress. He has no wheezes. He has no rhonchi. He has no  rales.  Abdominal: Soft. Normal appearance and bowel sounds are normal. He exhibits no distension, no ascites and no mass. There is no hepatosplenomegaly. There is no tenderness. There is no rebound, no guarding and no CVA tenderness.  Musculoskeletal: Normal range of motion. He exhibits no edema or tenderness.  BUE AV fistulas, with no palpable thrill. S/P left BKA and right transmetatarsal amputation. There is a wound dehiscence, with the first metatarsal bone completely exposed. No purulence or signs of infection.  Lymphadenopathy:    He has no cervical adenopathy.  Neurological: He is alert and oriented to person, place, and time. He has normal strength. No cranial nerve deficit or sensory deficit.  Skin: Skin is warm, dry and intact. No petechiae and no rash noted. He is not diaphoretic. No erythema. No pallor.  Psychiatric: He has a normal mood and affect. His behavior is normal. Judgment normal.  Nursing note and vitals reviewed.   ED Course  Procedures (including critical care time)  DIAGNOSTIC STUDIES: Oxygen Saturation is 95% on RA, adequate by my interpretation.    COORDINATION OF CARE: 1:26 AM - Discussed treatment plan with pt at bedside which includes right foot XR. Pt verbalized understanding and agreed to plan.   Labs Review Labs  Reviewed - No data to display  Imaging Review Dg Foot Complete Right  09/09/2015  CLINICAL DATA:  Recent transmetatarsal amputation, stitches opens today. EXAM: RIGHT FOOT COMPLETE - 3+ VIEW COMPARISON:  Postoperative radiographs 08/08/2015 FINDINGS: Post transmetatarsal amputation. Resection margins remain smooth. Questionable air in the soft tissues medially adjacent to the first metatarsal stump versus exposed bone. There are vascular calcifications. Two small densities in the surgical bed adjacent to the second-third metatarsal stump, may be related to overlying dressing. IMPRESSION: Post transmetatarsal amputation. Questionable air in the medial soft tissues versus exposed bone of the first metatarsal stump. Two small densities in the surgical bed adjacent to the second-third metatarsal stumps, of uncertain significance and may be related to overlying dressing. Electronically Signed   By: Jeb Levering M.D.   On: 09/09/2015 02:28   I have personally reviewed and evaluated these images and lab results as part of my medical decision-making.   EKG Interpretation None      MDM   Final diagnoses:  None   Patient presents to the emergency department for his wound opening. Physical exam does not reveal any signs of infection. There is bone exposure. I consult with orthopedic surgery who recommends wet-to-dry dressings wrapped with a Kerlix. Plan to see Dr. Berenice Primas in clinic later today March 7 for possible closure in the clinic first operating room. Patient demonstrates understanding with the plan. X-ray does not show any signs of osteomyelitis, air is exposed bone which I see on physical exam. He appears well in no acute distress, vital signs were within his normal limits and he is safe for discharge.    I personally performed the services described in this documentation, which was scribed in my presence. The recorded information has been reviewed and is accurate.      Everlene Balls,  MD 09/09/15 [phone]

## 2015-09-12 ENCOUNTER — Other Ambulatory Visit: Payer: Self-pay | Admitting: *Deleted

## 2015-09-12 ENCOUNTER — Ambulatory Visit (INDEPENDENT_AMBULATORY_CARE_PROVIDER_SITE_OTHER)
Admission: RE | Admit: 2015-09-12 | Discharge: 2015-09-12 | Disposition: A | Discharge status: Home / Self Care | Payer: Medicare Other | Source: Ambulatory Visit | Attending: Vascular Surgery | Admitting: Vascular Surgery

## 2015-09-12 ENCOUNTER — Telehealth: Payer: Self-pay

## 2015-09-12 ENCOUNTER — Ambulatory Visit (HOSPITAL_COMMUNITY)
Admission: RE | Admit: 2015-09-12 | Discharge: 2015-09-12 | Disposition: A | Discharge status: Home / Self Care | Payer: Medicare Other | Source: Ambulatory Visit | Attending: Vascular Surgery | Admitting: Vascular Surgery

## 2015-09-12 DIAGNOSIS — S91301A Unspecified open wound, right foot, initial encounter: Secondary | ICD-10-CM

## 2015-09-12 DIAGNOSIS — W3400XA Accidental discharge from unspecified firearms or gun, initial encounter: Principal | ICD-10-CM

## 2015-09-12 DIAGNOSIS — S91331A Puncture wound without foreign body, right foot, initial encounter: Secondary | ICD-10-CM

## 2015-09-12 DIAGNOSIS — Z89512 Acquired absence of left leg below knee: Secondary | ICD-10-CM | POA: Insufficient documentation

## 2015-09-12 NOTE — Telephone Encounter (Signed)
This Case Manager placed call to patient to check on status and to inform him that there is a Transitional Care Clinic appointment available on 09/16/15 at 1600. Patient indicated 1600 would be too late, and he planned to keep his appointment on 09/18/15 at 0945 with Dr. Venetia NightAmao. He indicated he missed his appointment on 09/09/15 because he had to go to the ED on 09/08/15 because the wound on his foot "busted open." Patient explained he had to follow-up with the Orthopedic Surgeon on 09/09/15. He indicated daily dressing changes have to be done now on his right foot. He indicates [facility] dressings every other day, and he changes the dressings himself on days when the [facility] RN does not come to his home. Patient indicated he was "tired of things going wrong." Listened to patient intently and asked patient if he would like to speak with clinic Child psychotherapistocial Worker. Patient indicated he did not want to speak with Child psychotherapistocial Worker. Patient appreciative of call.  No additional needs/concerns identified at this time.

## 2015-09-17 ENCOUNTER — Telehealth: Payer: Self-pay

## 2015-09-18 ENCOUNTER — Inpatient Hospital Stay: Payer: Medicare Other | Admitting: Family Medicine

## 2015-09-22 ENCOUNTER — Encounter: Payer: Self-pay | Admitting: Surgery

## 2015-09-26 ENCOUNTER — Encounter: Payer: Medicare Other | Admitting: Surgery

## 2015-10-02 ENCOUNTER — Telehealth: Payer: Self-pay | Admitting: *Deleted

## 2015-10-02 NOTE — Telephone Encounter (Signed)
Misty StanleyLisa, from [facility], called asking for additional information to be added to pt's order for oxygen.  I found the original signed document, added the test date and [facility]  Called lisa back to inform

## 2015-10-04 NOTE — Telephone Encounter (Signed)
This Case Manager placed call to patient to remind him of his appointment on 09/18/15 at 0945 with Dr. Venetia NightAmao. Patient aware of appointment and indicated he had transportation to his upcoming appointment. Patient indicated [facility] (for [facility] care)/PT services with [facility].  This Case Manager inquired if patient was ever able to pick up glucometer. Patient indicated he was and has needed blood glucose monitoring supplies. Reminded patient to check blood glucose as directed and to keep record of blood sugars. Patient verbalized understanding. Also informed patient to bring all of his medications to his upcoming appointment for Dr. Venetia NightAmao to review. Patient again verbalized understanding. No additional needs/concerns identified.

## 2015-10-04 NOTE — Telephone Encounter (Signed)
Encounter opened in error

## 2015-10-04 DEATH — deceased

## 2016-06-16 IMAGING — CR DG CHEST 2V
2 series · 2 of 2 positions shown · non-contrast
Comparison: 08/30/2015

CLINICAL DATA: Recent history of pneumonia, followup exam

EXAM:
CHEST  2 VIEW

[chest pa]
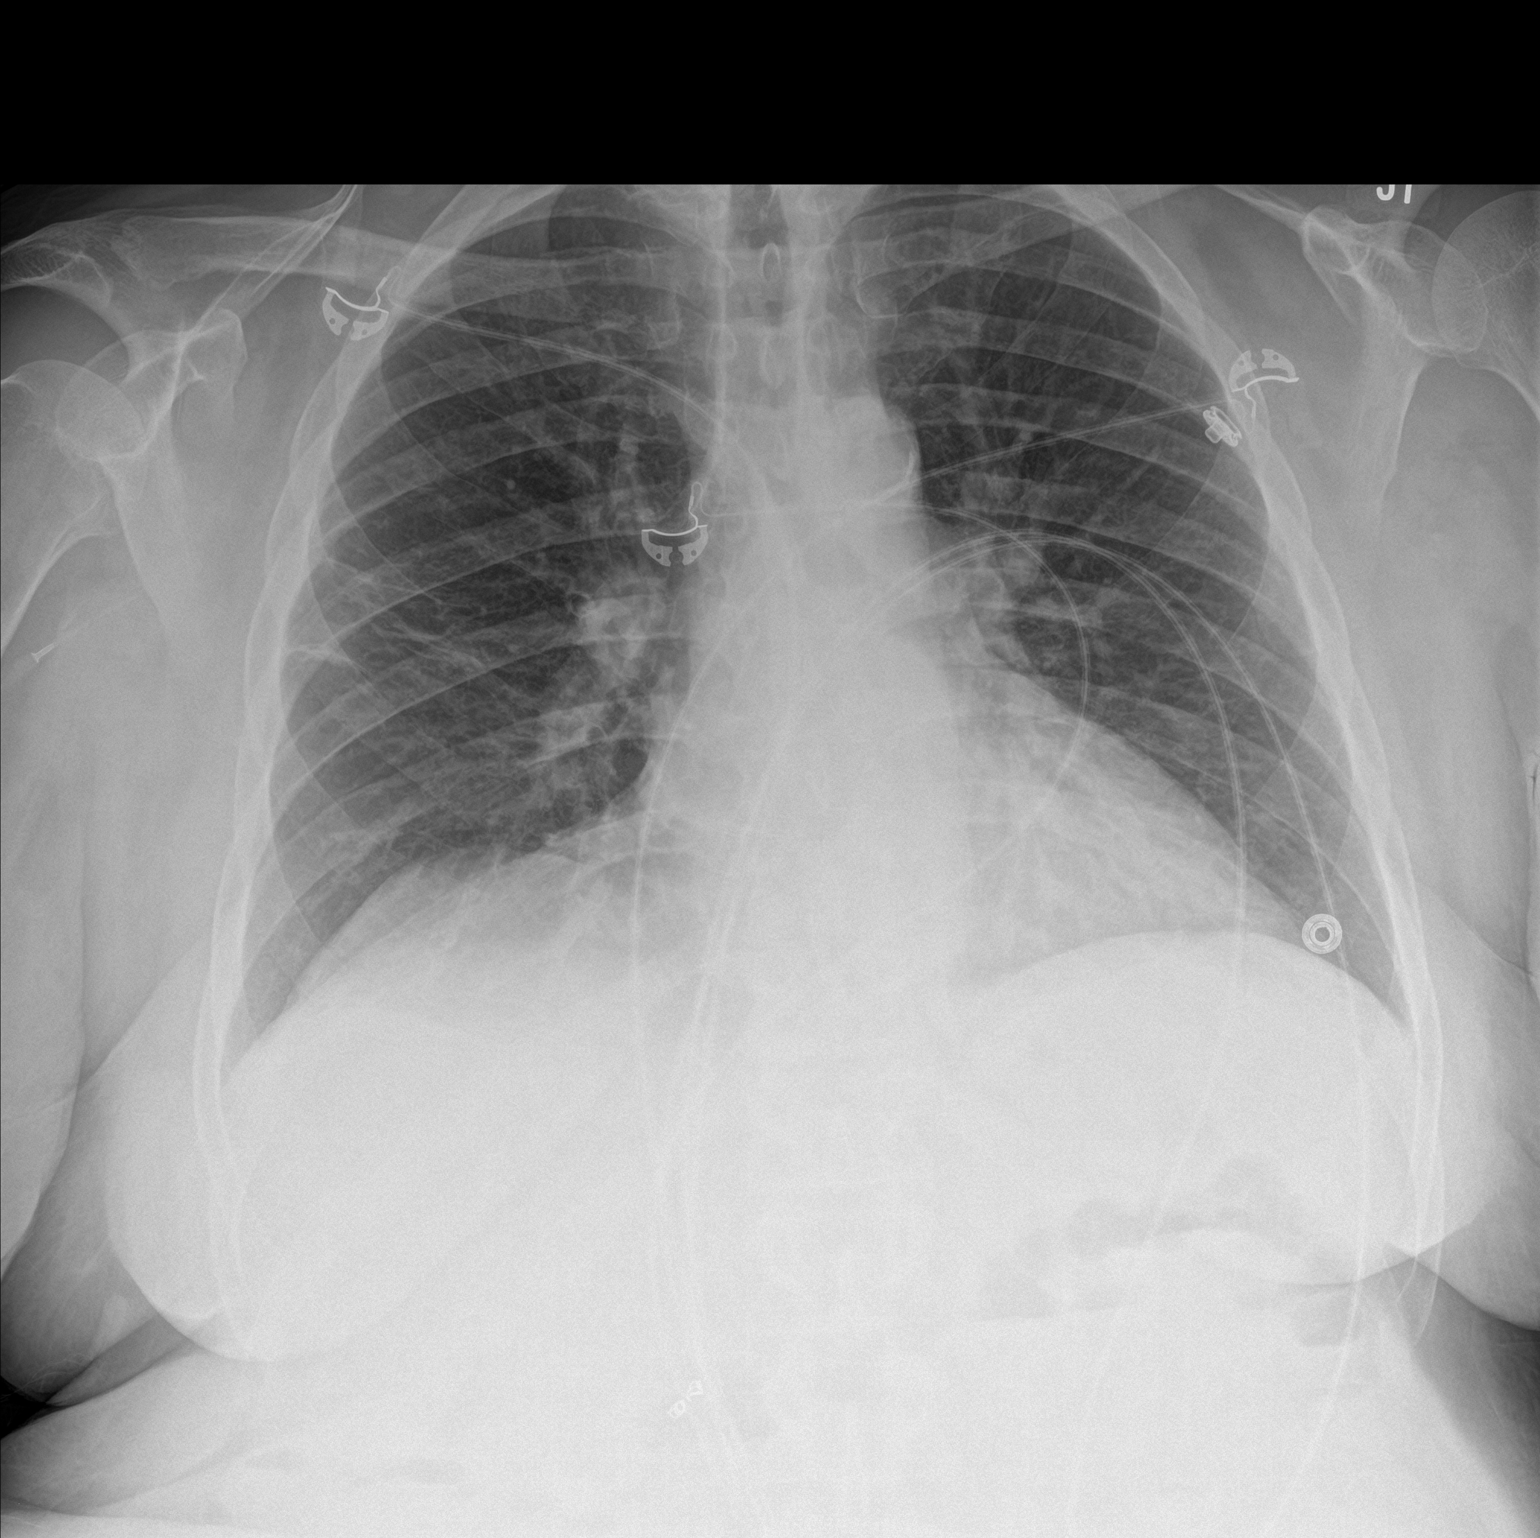

[chest lat]
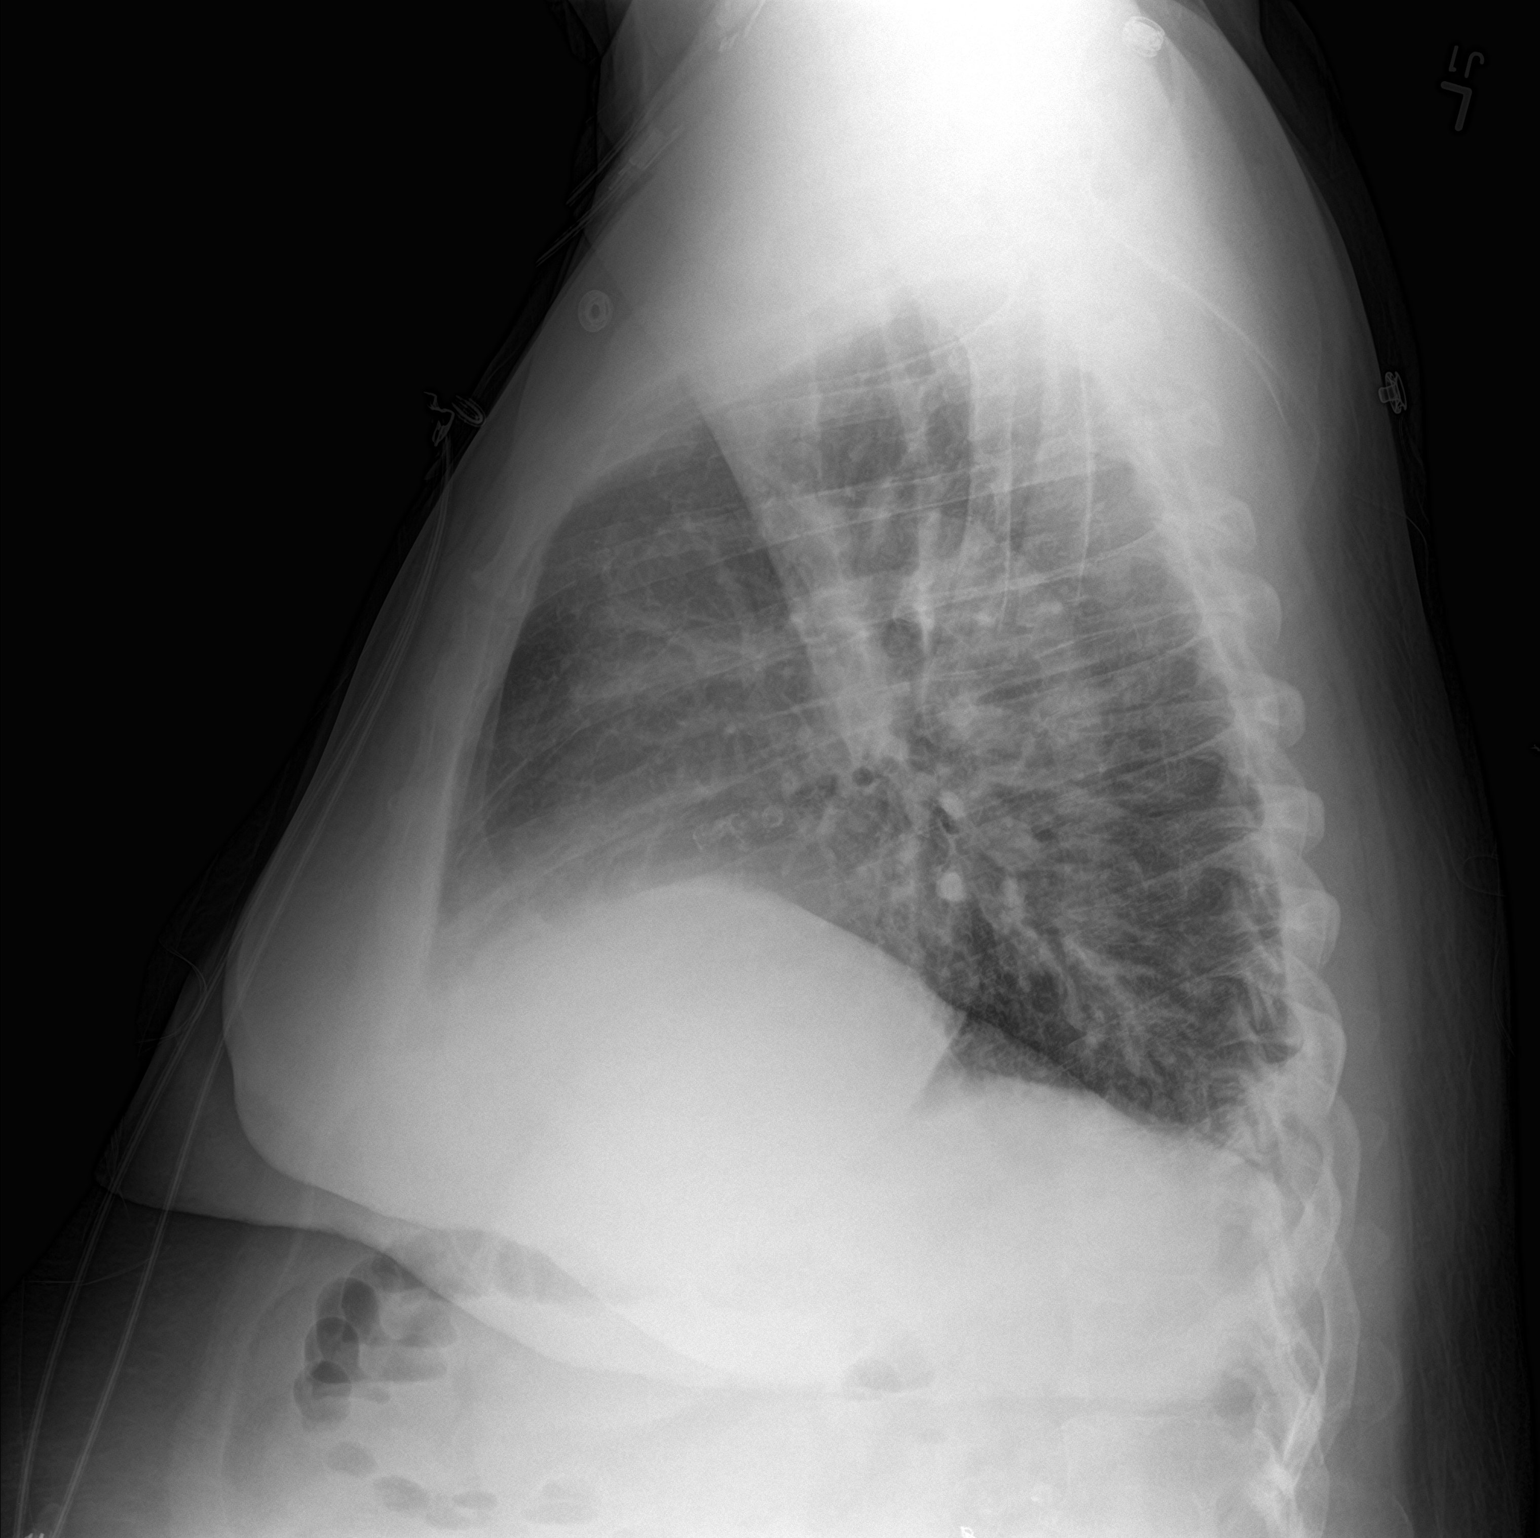

[2 of 2 positions shown; findings below may reference images not displayed]

FINDINGS: Cardiac shadow is within normal limits. Lungs are well aerated
bilaterally. Some scarring is noted in the right mid lung. There is
been interval clearing of the right basilar changes seen previously.
No acute bony abnormality is noted.
IMPRESSION: No acute abnormality seen.

## 2016-06-22 IMAGING — CR DG FOOT COMPLETE 3+V*R*
3 series · 3 of 3 positions shown · non-contrast
Comparison: Postoperative radiographs 08/08/2015

CLINICAL DATA: Recent transmetatarsal amputation, stitches opens
today.

EXAM:
RIGHT FOOT COMPLETE - 3+ VIEW

[foot ap]
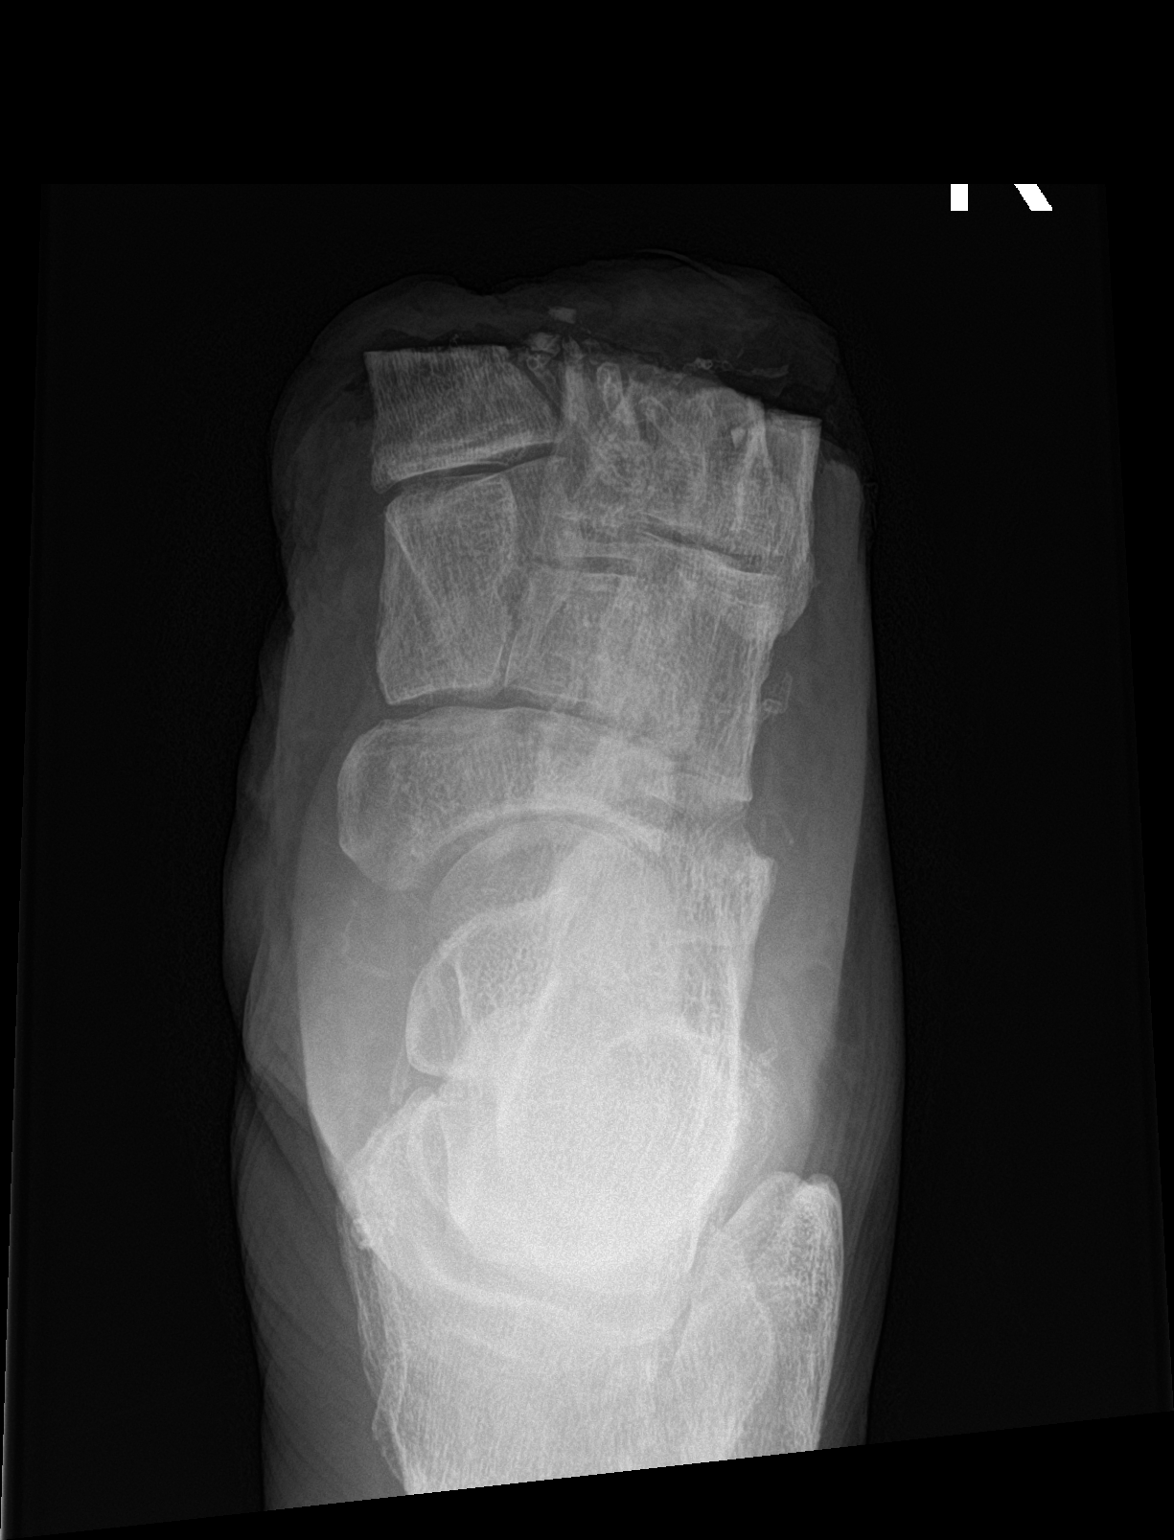

[foot obl]
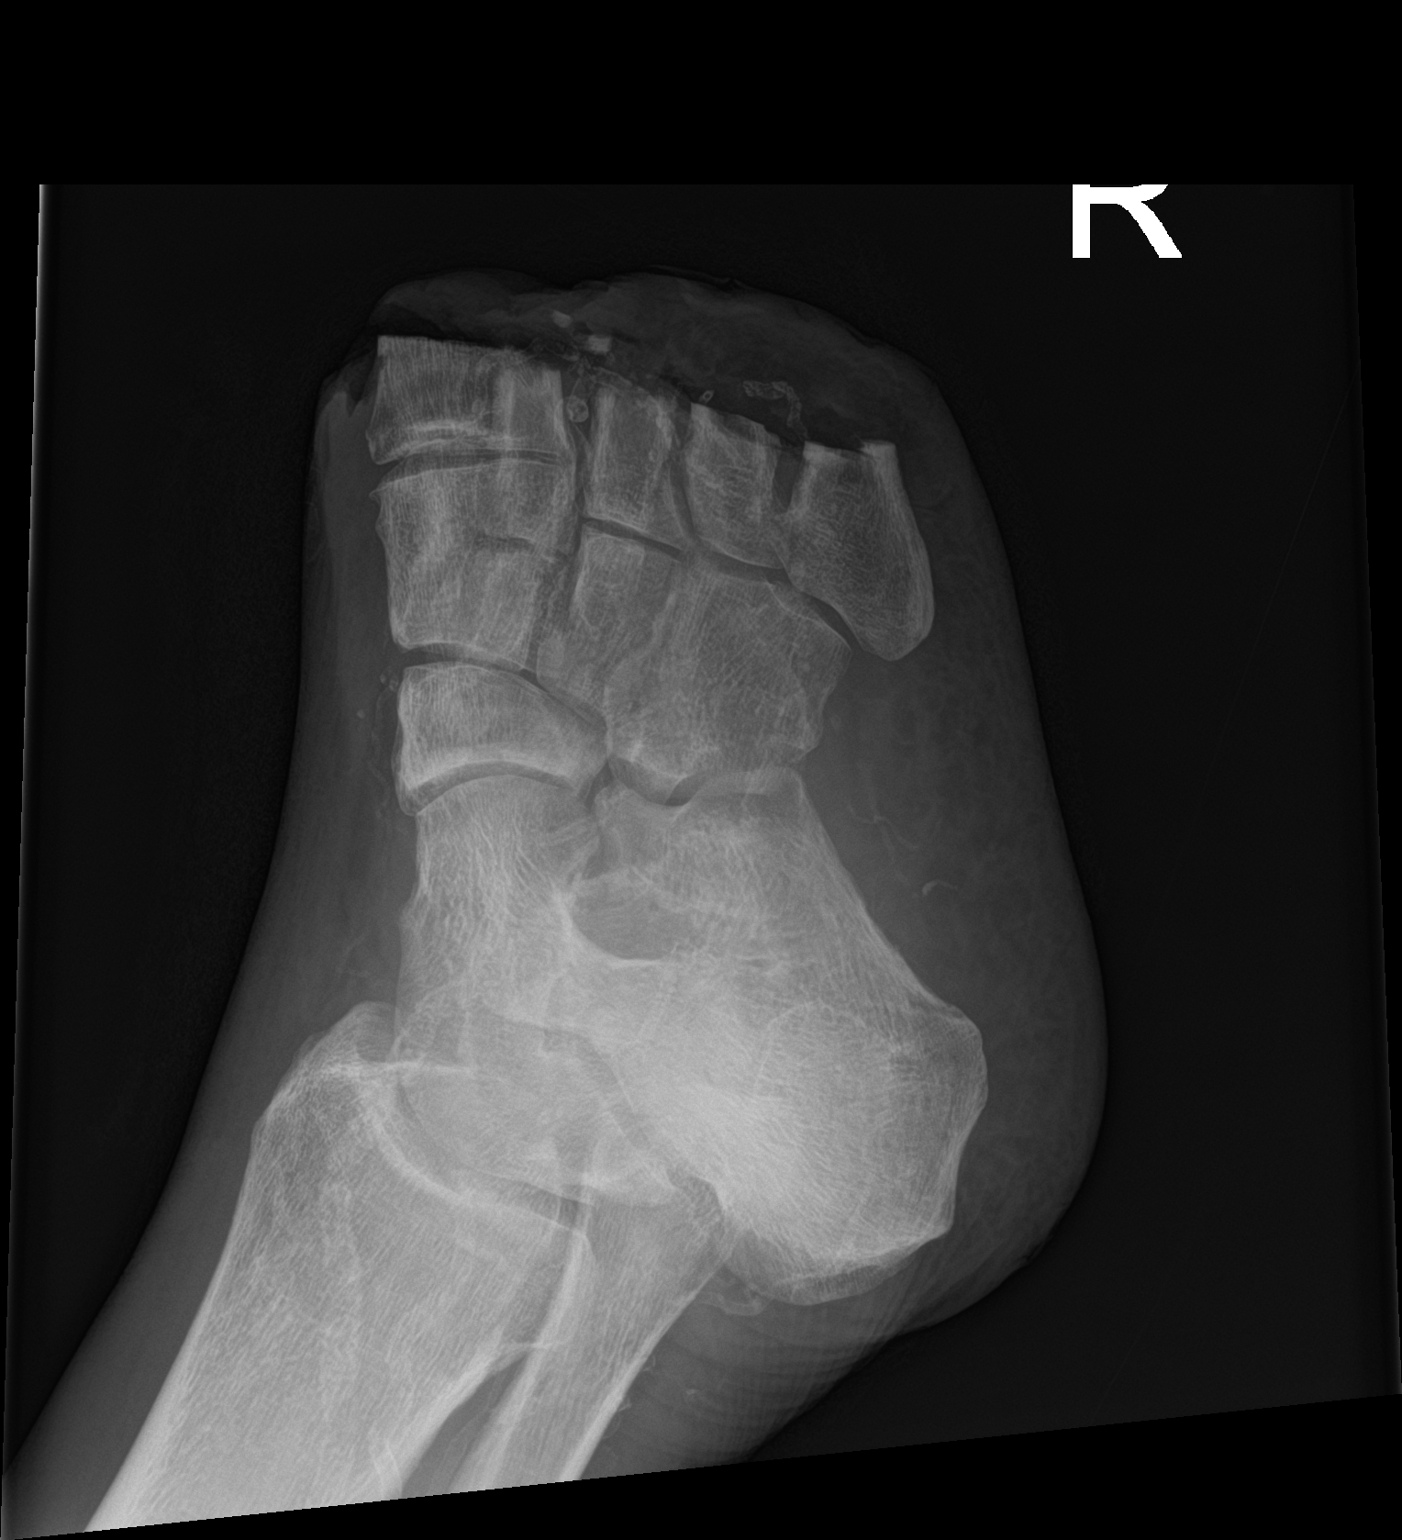

[foot lat]
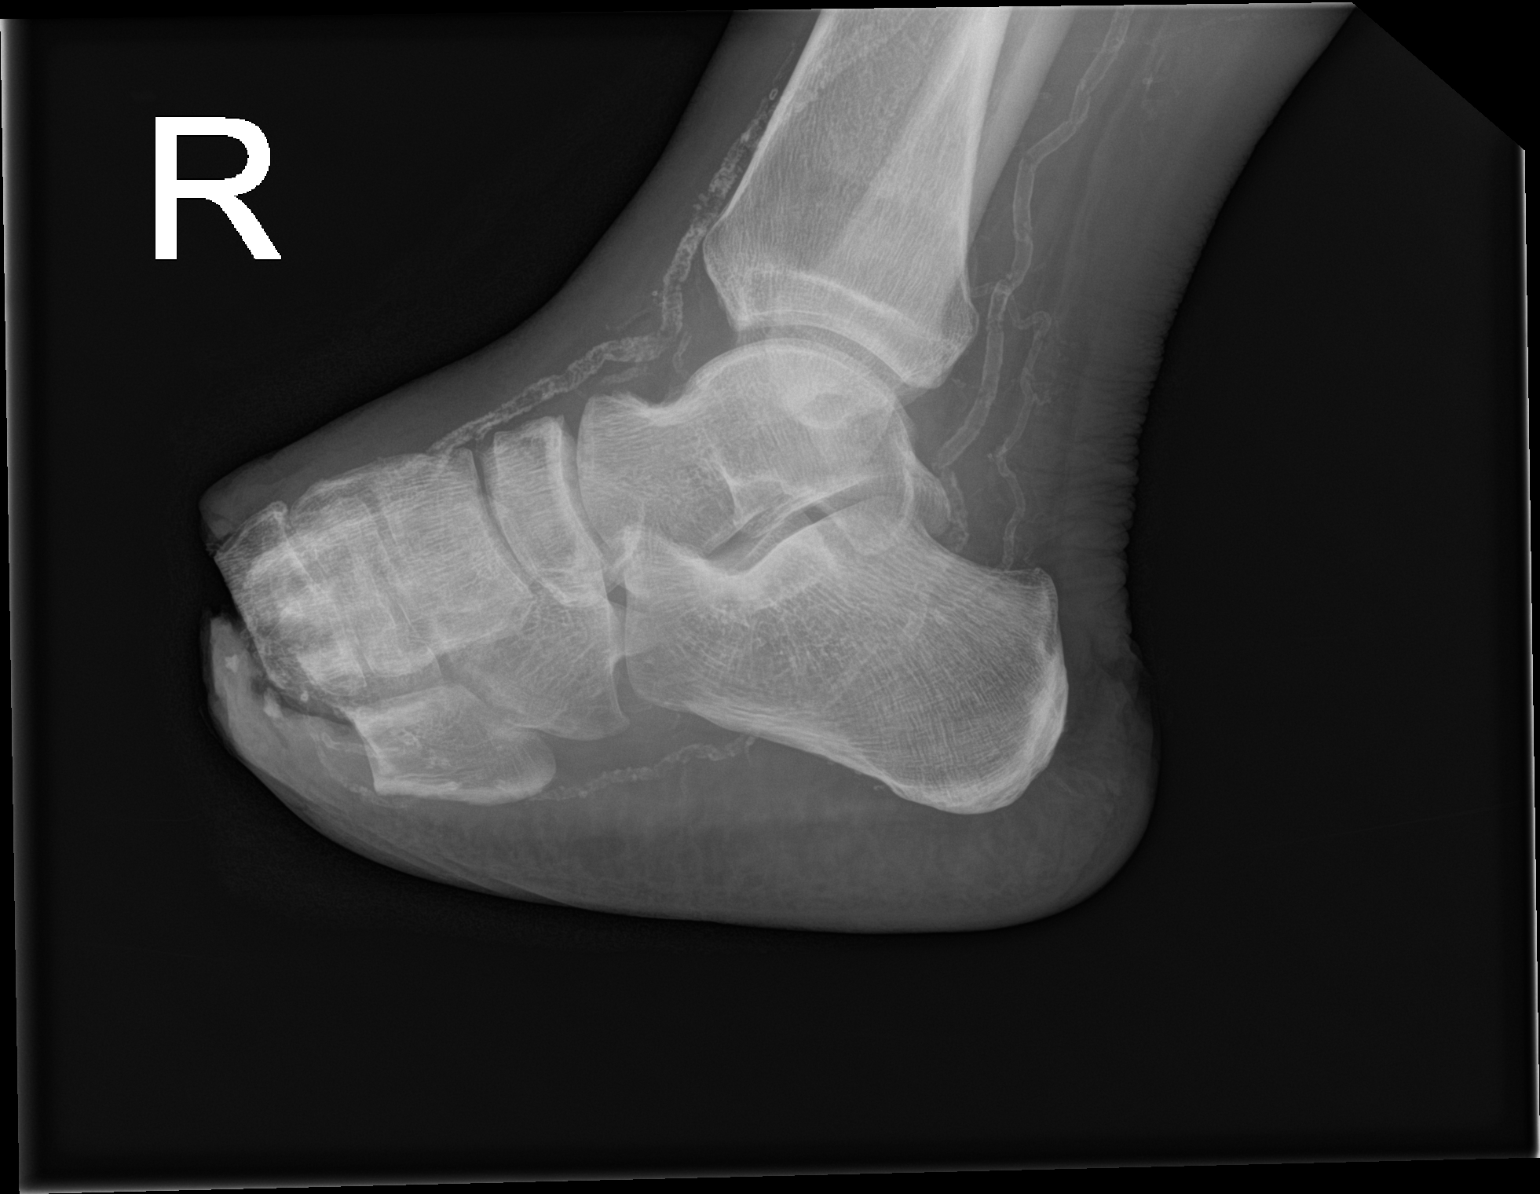

[3 of 3 positions shown; findings below may reference images not displayed]

FINDINGS: Post transmetatarsal amputation. Resection margins remain smooth.
Questionable air in the soft tissues medially adjacent to the first
metatarsal stump versus exposed bone. There are vascular
calcifications. Two small densities in the surgical bed adjacent to
the second-third metatarsal stump, may be related to overlying
dressing.
IMPRESSION: Post transmetatarsal amputation. Questionable air in the medial soft
tissues versus exposed bone of the first metatarsal stump. Two small
densities in the surgical bed adjacent to the second-third
metatarsal stumps, of uncertain significance and may be related to
overlying dressing.
